# Patient Record
Sex: Male | Born: 1940 | Race: White | Hispanic: No | Marital: Married | Smoking: Never smoker
Health system: Southern US, Community
[De-identification: ages and names within clinical notes are randomized; demographics above are authoritative.]

## PROBLEM LIST (undated history)

## (undated) DIAGNOSIS — I480 Paroxysmal atrial fibrillation: Secondary | ICD-10-CM

## (undated) DIAGNOSIS — K222 Esophageal obstruction: Secondary | ICD-10-CM

## (undated) DIAGNOSIS — M1990 Unspecified osteoarthritis, unspecified site: Secondary | ICD-10-CM

## (undated) DIAGNOSIS — I10 Essential (primary) hypertension: Secondary | ICD-10-CM

## (undated) DIAGNOSIS — C61 Malignant neoplasm of prostate: Secondary | ICD-10-CM

## (undated) DIAGNOSIS — J302 Other seasonal allergic rhinitis: Secondary | ICD-10-CM

## (undated) DIAGNOSIS — N189 Chronic kidney disease, unspecified: Secondary | ICD-10-CM

## (undated) DIAGNOSIS — R55 Syncope and collapse: Secondary | ICD-10-CM

## (undated) DIAGNOSIS — G039 Meningitis, unspecified: Secondary | ICD-10-CM

## (undated) DIAGNOSIS — R351 Nocturia: Secondary | ICD-10-CM

## (undated) DIAGNOSIS — K219 Gastro-esophageal reflux disease without esophagitis: Secondary | ICD-10-CM

## (undated) DIAGNOSIS — R42 Dizziness and giddiness: Secondary | ICD-10-CM

## (undated) DIAGNOSIS — H269 Unspecified cataract: Secondary | ICD-10-CM

## (undated) DIAGNOSIS — T7840XA Allergy, unspecified, initial encounter: Secondary | ICD-10-CM

## (undated) DIAGNOSIS — G629 Polyneuropathy, unspecified: Secondary | ICD-10-CM

## (undated) DIAGNOSIS — Z87442 Personal history of urinary calculi: Secondary | ICD-10-CM

## (undated) DIAGNOSIS — E039 Hypothyroidism, unspecified: Secondary | ICD-10-CM

## (undated) HISTORY — DX: Essential (primary) hypertension: I10

## (undated) HISTORY — PX: JOINT REPLACEMENT: SHX530

## (undated) HISTORY — DX: Malignant neoplasm of prostate: C61

## (undated) HISTORY — DX: Chronic kidney disease, unspecified: N18.9

## (undated) HISTORY — DX: Polyneuropathy, unspecified: G62.9

## (undated) HISTORY — DX: Allergy, unspecified, initial encounter: T78.40XA

## (undated) HISTORY — DX: Unspecified cataract: H26.9

## (undated) HISTORY — PX: TONSILLECTOMY: SUR1361

## (undated) HISTORY — DX: Esophageal obstruction: K22.2

## (undated) HISTORY — DX: Paroxysmal atrial fibrillation: I48.0

## (undated) HISTORY — PX: APPENDECTOMY: SHX54

## (undated) HISTORY — PX: EYE SURGERY: SHX253

## (undated) HISTORY — DX: Dizziness and giddiness: R42

## (undated) HISTORY — DX: Other seasonal allergic rhinitis: J30.2

## (undated) HISTORY — PX: CHOLECYSTECTOMY: SHX55

## (undated) HISTORY — DX: Hypothyroidism, unspecified: E03.9

---

## 1999-08-10 HISTORY — PX: COLONOSCOPY: SHX174

## 2001-01-13 ENCOUNTER — Encounter: Payer: Self-pay | Admitting: Family Medicine

## 2001-01-13 ENCOUNTER — Ambulatory Visit (HOSPITAL_COMMUNITY): Admission: RE | Admit: 2001-01-13 | Discharge: 2001-01-13 | Payer: Self-pay | Admitting: Family Medicine

## 2001-06-02 ENCOUNTER — Ambulatory Visit (HOSPITAL_COMMUNITY): Admission: RE | Admit: 2001-06-02 | Discharge: 2001-06-02 | Payer: Self-pay | Admitting: Family Medicine

## 2001-06-02 ENCOUNTER — Encounter: Payer: Self-pay | Admitting: Family Medicine

## 2001-11-23 ENCOUNTER — Ambulatory Visit (HOSPITAL_COMMUNITY): Admission: RE | Admit: 2001-11-23 | Discharge: 2001-11-23 | Payer: Self-pay | Admitting: *Deleted

## 2008-03-18 ENCOUNTER — Ambulatory Visit (HOSPITAL_COMMUNITY): Admission: RE | Admit: 2008-03-18 | Discharge: 2008-03-18 | Payer: Self-pay | Admitting: Family Medicine

## 2008-03-31 ENCOUNTER — Ambulatory Visit: Payer: Self-pay | Admitting: Gastroenterology

## 2008-04-12 ENCOUNTER — Ambulatory Visit: Payer: Self-pay | Admitting: Internal Medicine

## 2008-04-12 ENCOUNTER — Encounter: Payer: Self-pay | Admitting: Internal Medicine

## 2008-04-12 ENCOUNTER — Ambulatory Visit (HOSPITAL_COMMUNITY): Admission: RE | Admit: 2008-04-12 | Discharge: 2008-04-12 | Payer: Self-pay | Admitting: Internal Medicine

## 2008-04-12 HISTORY — PX: ESOPHAGOGASTRODUODENOSCOPY: SHX1529

## 2008-08-05 DIAGNOSIS — C61 Malignant neoplasm of prostate: Secondary | ICD-10-CM

## 2008-08-05 HISTORY — DX: Malignant neoplasm of prostate: C61

## 2010-06-27 ENCOUNTER — Encounter: Payer: Self-pay | Admitting: Internal Medicine

## 2010-07-23 ENCOUNTER — Ambulatory Visit (HOSPITAL_COMMUNITY)
Admission: RE | Admit: 2010-07-23 | Discharge: 2010-07-23 | Payer: Self-pay | Source: Home / Self Care | Attending: Internal Medicine | Admitting: Internal Medicine

## 2010-07-23 HISTORY — PX: COLONOSCOPY: SHX174

## 2010-09-04 NOTE — Letter (Signed)
Summary: TRIAGE ORDER  TRIAGE ORDER   Imported By: Ave Filter 06/27/2010 16:10:40  _____________________________________________________________________  External Attachment:    Type:   Image     Comment:   External Document

## 2010-12-18 NOTE — Consult Note (Signed)
NAME:  MONTEY, EBEL               ACCOUNT NO.:  [id number]   MEDICAL RECORD NO.:  [id number]          PATIENT TYPE:  AMB   LOCATION:  DAY                           FACILITY:  [facility]   PHYSICIAN:  R. Roetta Sessions, M.D. DATE OF BIRTH:  Dec 17, 1940   DATE OF CONSULTATION:  DATE OF DISCHARGE:                                 CONSULTATION   REQUESTING PHYSICIAN:  Patrica Duel, MD   REASON FOR CONSULTATION:  Dysphagia.   HISTORY OF PRESENT ILLNESS:  Mr. Mcglasson is a 70 year old Caucasian  male.  He has had an intermittent history of solid food dysphagia.  He  has had about 5 episodes within the last year.  He tells me as though  food gets stuck and points to his mid-esophagus.  He has had 1 episode  with a big gulp of Gatorade, but otherwise denies any problems with  liquids.  He denies any history of regurgitation.  Denies any nausea,  vomiting, anorexia, or early satiety.  His weight has remained stable.  He has been on omeprazole 20 mg p.r.n., but generally only takes this  when he eats [place] or [place] food about once a week for heartburn and  indigestion.  He denies rectal bleeding or melena.Marland Kitchen   PAST MEDICAL AND SURGICAL HISTORY:  Hypertension, seasonal allergies.  Colonoscopy by Dr. Jena Gauss on August 10, 1999, showed granular rectal  mucosa/chronic inflammatory infiltrate, and he had a inflammatory polyp  removed from the hepatic flexure and right colon diverticula, internal  hemorrhoids, and normal TI.  He has had some prostate problems.  He is  status post cholecystectomy about 15 years ago for large gallstone.  He  is status post appendectomy, tonsillectomy, and he had a benign tumor  removed from his right arm.   CURRENT MEDICATIONS:  1. Toprol 50 mg daily.  2. Norvasc 2.5 mg daily.  3. Prozac 10 mg daily.  4. Flomax 0.4 mg daily.  5. Multivitamin daily.  6. Fish oil 2 g daily.  7. Nabumetone 500 mg p.r.n.  8. Omeprazole 20 mg p.r.n.  9. Potassium 99 mg daily p.r.n.  10.Excedrin 1-2 about 2-3 days p.r.n.   ALLERGIES:  SULFA and PENICILLIN.   FAMILY HISTORY:  There is no known family history of colorectal  carcinoma, liver, or chronic GI problems.  One brother at age 40 has  history of prostate carcinoma.  Father deceased at age 74 secondary to  prostate carcinoma.  Mother deceased at 83 secondary to coronary artery  disease.   SOCIAL HISTORY:  Mr. Shawler is married.  He has 2 healthy children.  He  is retired from [employer] treasurer.  He denies any tobacco or drug use.  He consumes 3 beers per week.   REVIEW OF SYSTEMS:  See HPI.  He has been having frequent headaches  about 3 times a week.  He attributes this to his seasonal allergies.  He  is being worked up by Dr. Nobie Putnam for this and some of his medications  are being adjusted.  Otherwise, negative review of systems.   PHYSICAL EXAMINATION:  VITAL SIGNS:  Weight 214 pounds, height 71  inches, temperature 97.9, blood pressure 118/72, and pulse 64.  GENERAL:  He is a well-developed, well-nourished Caucasian male in no  acute distress.  HEENT:  Sclerae clear, nonicteric. Conjunctivae pink. Oropharynx pink  and moist without any lesions.  NECK: Supple without mass or thyromegaly.  CHEST:  Heart regular rate and rhythm.  Normal S1, S2 without murmurs,  clicks, rubs or gallops.  LUNGS:  Clear to auscultation bilaterally.  ABDOMEN:  Positive bowel sounds x4.  No bruits auscultated.  Soft,  nontender, and nondistended without palpable mass or hepatosplenomegaly.  No rebound tenderness or guarding.  EXTREMITIES:  Without clubbing or edema bilaterally.  SKIN:  Pink, warm, and dry without any rash or jaundice.   IMPRESSION:  Mr. Schwertner is a 70 year old Caucasian male with  intermittent mostly solid food dysphagia over the last year.  He has  never had a evaluation with the esophagogastroduodenoscopy to rule out  esophageal web, ring, or stricture.  He has infrequent gastroesophageal  reflux  disease with episodes about once per week and is on on-demand  proton pump inhibitor.  I suspect he may have complicated  gastroesophageal reflux disease as a culprit of his symptoms, but we do  need to rule out an occult malignancy and esophageal motility disorder  will remain in the differential as well.   PLAN:  1. EGD with possible esophageal dilatation with Dr. Jena Gauss in the near      future.  I have discussed the procedure including risk and      benefits, which include, but not limited to bleeding, infection,      perforation, and drug reaction and he agrees to plan and consent to      be obtained.  He is to hold any aspirin products for about 5 days      prior to his procedure.  He can continue omeprazole 20 mg p.r.n.,      but he may need daily PPI.  We will see what EGD shows.  2. His screening colonoscopy on August 24, 2009.  We will put a      remainder in the computer.   Thank you Dr. Nobie Putnam for allowing [place] to participate in the care of Mr.  Malveaux.      Lorenza Burton, N.P.      Jonathon Bellows, M.D.  Electronically Signed    KJ/MEDQ  D:  03/31/2008  T:  04/01/2008  Job:  [id number]

## 2010-12-18 NOTE — Op Note (Signed)
NAME:  Brent Wilkins, HAUG               ACCOUNT NO.:  [id number]   MEDICAL RECORD NO.:  [id number]          PATIENT TYPE:  AMB   LOCATION:  DAY                           FACILITY:  [facility]   PHYSICIAN:  R. Roetta Sessions, M.D. DATE OF BIRTH:  12/18/1940   DATE OF PROCEDURE:  DATE OF DISCHARGE:                               OPERATIVE REPORT   INDICATIONS FOR PROCEDURE:  A 70 year old gentleman with esophageal  dysphagia to solids.  He does take aspirin in the way of Excedrin on a  regular basis and takes omeprazole 20 mg as needed for indigestion.  EGD now being done potential for esophageal dilation.  Reviewed risks,  benefits, and alternatives have been discussed.  Please see  documentation in the medical record.   PROCEDURE NOTE:  O2 saturation, blood pressure, pulse, and respirations  monitored throughout the entire procedure.   CONSCIOUS SEDATION:  Versed 5 mg IV and Demerol 100 mg IV in divided  doses.  Cetacaine spray for topical pharyngeal anesthesia.   INSTRUMENT:  Pentax video chip system.   FINDINGS:  Examination of the tubular esophagus revealed a prominent  Schatzki ring with fibrotic component overlying esophageal erosions.  There was no Barrett esophagus or evidence of neoplasia.  The scope  passed EG junction with slight amount of resistance at the hernial  ring.   STOMACH:  Gastric cavity was emptied, insufflated well with air.  A  thorough examination of the gastric mucosa including retroflexion of the  proximal stomach esophagogastric junction demonstrated a small hiatal  hernia and the previously noted ring was seen well retroflexed.  Also,  the patient had multiple prepyloric erosions and multiple 1-3 mm ulcers,  these appeared to be benign lesions.  The remainder of the gastric  mucosa appeared unremarkable.  Pylorus patent, easily traversed.  Examination of the bulb and second portion revealed no abnormalities.   THERAPEUTIC/DIAGNOSTIC MANEUVERS PERFORMED:  Scope  was withdrawn.  A 56-  French Maloney dilator was passed in full insertion with slight  resistance.  Upon full insertion.  A look back revealed the ring had  been nicely dilated without apparent complication.  Subsequently, the  antral ulcers were biopsied for histologic study, and the patient  tolerated the procedure well and was reactive to endoscopy.   IMPRESSION:  1. 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  2. Multiple antral erosions, ulcerations, status post biopsy, small      hiatal hernia otherwise unremarkable stomach.   RECOMMENDATIONS:  1. 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  He needs to take      omeprazole 20 mg every day, stress the nature of reflux and the      setting is complicated and is      a chronic disease, needing daily suppression therapy.  2. Followup on path/rule out H. Pylori.  3. Further recommendations to follow.  Jonathon Bellows, M.D.  Electronically Signed     RMR/MEDQ  D:  04/12/2008  T:  04/12/2008  Job:  [id number]   cc:   Patrica Duel, M.D.  Fax: [phone]

## 2010-12-21 NOTE — Procedures (Signed)
[place]. [facility]  Patient:    JANCARLOS, THRUN Visit Number: [id number] MRN: [id number]          Service Type: CAT Location: [facility] [address] 01 Attending Physician:  Darlin Priestly Dictated by:   Darlin Priestly, M.D. Proc. Date: 11/23/01 Admit Date:  11/23/2001 Discharge Date: 11/23/2001   CC:         A. Kem Boroughs, M.D.  Jonell Cluck, M.D.   Procedure Report  PROCEDURES: Heads up tilt-table testing.  COMPLICATIONS: None.  INDICATIONS: The patient is a 70 year old white male, patient of Dr. Kem Boroughs and Dr. Patrica Duel, with a history of recurrent syncope, hypertension, hyperlipidemia. He is now referred for tilt-table testing to rule out vasodepressive syncope.  DESCRIPTION OF OPERATION: After given informed written consent, the patient was brought to the EP lab in a fasting state. The patient was then placed in a supine position and hemodynamic measurements were obtained. Resting blood pressure 140/85, resting heart 69.  The patient was then tilted to 70-degree heads up position. He remained hemodynamically stable until approximately 28 minutes into the heads up tilt when the patient became hypotensive with blood pressure dropping to 70/58 and pulse of 43. The patient became light headed and had syncopal event. The patient was then turned to a supine position where his heart rate and blood pressure returned to 130/77 and a pulse of 65. He was then transferred to the recovery room in stable condition.  CONCLUSIONS: Positive heads up tilt-table testing. Dictated by:   Darlin Priestly, M.D. Attending Physician:  Darlin Priestly DD:  11/23/01 TD:  11/23/01 Job: [id number]

## 2011-04-25 ENCOUNTER — Other Ambulatory Visit (HOSPITAL_COMMUNITY): Payer: Self-pay | Admitting: Internal Medicine

## 2011-04-25 DIAGNOSIS — R51 Headache: Secondary | ICD-10-CM

## 2011-04-25 DIAGNOSIS — R42 Dizziness and giddiness: Secondary | ICD-10-CM

## 2011-04-29 ENCOUNTER — Ambulatory Visit (HOSPITAL_COMMUNITY)
Admission: RE | Admit: 2011-04-29 | Discharge: 2011-04-29 | Disposition: A | Discharge status: Home / Self Care | Payer: Medicare Other | Source: Ambulatory Visit | Attending: Internal Medicine | Admitting: Internal Medicine

## 2011-04-29 DIAGNOSIS — R51 Headache: Secondary | ICD-10-CM

## 2011-04-29 DIAGNOSIS — I10 Essential (primary) hypertension: Secondary | ICD-10-CM | POA: Insufficient documentation

## 2011-04-29 DIAGNOSIS — R42 Dizziness and giddiness: Secondary | ICD-10-CM

## 2011-04-29 DIAGNOSIS — C61 Malignant neoplasm of prostate: Secondary | ICD-10-CM | POA: Insufficient documentation

## 2011-10-21 ENCOUNTER — Encounter: Payer: Self-pay | Admitting: Internal Medicine

## 2011-10-22 ENCOUNTER — Encounter: Payer: Self-pay | Admitting: Urgent Care

## 2011-10-22 ENCOUNTER — Ambulatory Visit (INDEPENDENT_AMBULATORY_CARE_PROVIDER_SITE_OTHER): Payer: Medicare Other | Admitting: Urgent Care

## 2011-10-22 DIAGNOSIS — R1310 Dysphagia, unspecified: Secondary | ICD-10-CM | POA: Insufficient documentation

## 2011-10-22 DIAGNOSIS — K222 Esophageal obstruction: Secondary | ICD-10-CM | POA: Insufficient documentation

## 2011-10-22 DIAGNOSIS — Q391 Atresia of esophagus with tracheo-esophageal fistula: Secondary | ICD-10-CM

## 2011-10-22 DIAGNOSIS — K219 Gastro-esophageal reflux disease without esophagitis: Secondary | ICD-10-CM

## 2011-10-22 HISTORY — DX: Esophageal obstruction: K22.2

## 2011-10-22 MED ORDER — OMEPRAZOLE 20 MG PO CPDR: 20.0000 mg | DELAYED_RELEASE_CAPSULE | Freq: Every day | ORAL | Status: DC

## 2011-10-22 NOTE — Assessment & Plan Note (Signed)
Brent Wilkins is a pleasant 71 y.o. male history of erosive reflux esophagitis, chronic gastritis, Schatzki's ring presents with recurrent dysphagia.  He is on Relafen twice daily. He previously had gastritis, most likely NSAID-induced.  Unfortunately, he has not been compliant with daily PPI.  EGD with Dr. Jena Gauss with possible esophageal dilation.  I have discussed risks & benefits which include, but are not limited to, bleeding, infection, perforation & drug reaction.  The patient agrees with this plan & written consent will be obtained.   Resume omeprazole 20 mg daily

## 2011-10-22 NOTE — Patient Instructions (Signed)
Begin omeprazole (Prilosec) 20 mg daily You will need an EGD and dilation of your esophagus with Dr. Jena Gauss in the near future. We will call to schedule. Please call us back if you do not hear anything by the end of this week. Dysphagia Swallowing problems (dysphagia) occur when solids and liquids seem to stick in your throat on the way down to your stomach, or the food takes longer to get to the stomach. Other symptoms (problems) include regurgitating (burping) up food, noises coming from the throat, chest discomfort with swallowing, and a feeling of fullness in the throat when swallowing. When blockage in the throat is complete it may be associated with drooling. CAUSES There are many causes of swallowing difficulties and the following is generalized information regarding a number of reasons for this problem. Problems with swallowing may occur because of problems with the muscles. The food cannot be propelled in the usual manner into the stomach. There may be ulcers, scar tissueor inflammation (soreness) in the esophagus (the food tube from the mouth to the stomach) which blocks food from passing normally into the stomach. Causes of inflammation include acid reflux from the stomach into the esophagus. Inflammation can also be caused by the herpes simplex virus, Candida (yeast), radiation (as with treatment of cancer), or inflammation from medications not taken with adequate fluids to wash them down into the stomach. There may be nerve problems so signals cannot be sent adequately telling the muscles of the esophagus to contract and move the food along. Achalasia is a rare disorder of the esophagus in which muscular contractions of the esophagus are uncoordinated. Globus hystericus is a relatively common problem in young females in which there is a sense of an obstruction or difficulty in swallowing, but in which no abnormalities can be found. This problem usually improves over time with reassurance and  testing to rule out other causes. DIAGNOSIS A number of tests will help your caregiver know what is the cause of your swallowing problems. These tests may include a barium swallow in which x-rays are taken while you are drinking a liquid that outlines the lining of the esophagus on x-ray. If the stomach and small bowel are also studied in this manner it is called an upper gastrointestinal exam (UGI). Endoscopy may be done in which your caregiver examines your throat, esophagus, stomach and small bowel with an instrument like a small flexible telescope. Motility studies which measure the effectiveness and coordination of the muscular contractions of the esophagus may also be done. TREATMENT The treatment of swallowing problems are many, varying from medications to surgical treatment. The treatment varies with the type of problem found. Your caregiver will discuss your results and treatment with you. If swallowing problems are severe the long term problems which may occur include: malnutrition, pneumonia (from food going into the breathing tubes called trachea and bronchi), and an increase in tumors (lumps) of the esophagus. SEEK IMMEDIATE MEDICAL CARE IF:  Food or other object becomes lodged in your throat or esophagus and won't move.  Document Released: 07/19/2000 Document Revised: 07/11/2011 Document Reviewed: 03/09/2008 [facility] Patient Information 2012 [place], [place].

## 2011-10-22 NOTE — Progress Notes (Signed)
Faxed to PCP

## 2011-10-22 NOTE — Progress Notes (Signed)
Primary Care Physician:  Cassell Smiles., MD, MD Primary Gastroenterologist: Dr. Jena Gauss  Chief Complaint  Patient presents with  . Dysphagia    HPI:  Brent Wilkins is a 71 y.o. male with recurrent dysphagia. He has history of Schatzki's ring and erosive/ulcerative esophagitis. Last dilation was in 2009. He reports having problems over the last 6 months with dry meats and cold liquids. He tells me his last dilation held for approximately 3 years. He has been very careful chewing his food thoroughly. He feels as though his food and liquids get stuck midsternally. He occasionally has regurgitation of undigested food. He denies any heartburn or indigestion unless he eats certain foods like [place]. He has been taking TUMS which seems to help. He stopped omeprazole 20 mg daily years ago as he felt he did not need this medication. His weight has remained stable. His appetite is good. Denies any lower GI symptoms including constipation, diarrhea, rectal bleeding, or melena. He is taking Relafen twice daily.  Past Medical History  Diagnosis Date  . HTN (hypertension)   . Seasonal allergies   . Prostate cancer     Dr. Earlene Plater  . Neuropathy     Bilateral ankles  . Hypothyroidism     Past Surgical History  Procedure Date  . Tonsillectomy   . Appendectomy   . Colonoscopy 07/23/10    Dr. Elmer Ramp rectum, scattered pancolonic diverticula  . Colonoscopy 08/10/1999    internal hemorrhoids,inflammatory polyp  . Cholecystectomy   . Esophagogastroduodenoscopy 04/12/08    Prominent Schatzki's ring, erosive reflux esophagitis, multiple antral erosions, small hiatal hernia, reactive gastropathy, status post dilation with 61F    Current Outpatient Prescriptions  Medication Sig Dispense Refill  . amLODipine (NORVASC) 2.5 MG tablet Take 5 mg by mouth daily.       . AVODART 0.5 MG capsule Take 0.5 mg by mouth daily.       . CHONDROITIN SULFATE PO Take 1,200 mg by mouth 2 (two) times daily.      . fish  oil-omega-3 fatty acids 1000 MG capsule Take 2 g by mouth daily.      . Multiple Vitamin (MULTIVITAMIN) capsule Take 1 capsule by mouth daily.      . nabumetone (RELAFEN) 500 MG tablet Take 500 mg by mouth 2 (two) times daily.       . Potassium 99 MG TABS Take 99 mg by mouth daily.      . propranolol (INDERAL) 40 MG tablet Take 40 mg by mouth 2 (two) times daily.       Marland Kitchen SYNTHROID 25 MCG tablet Take 25 mcg by mouth daily.       . Tamsulosin HCl (FLOMAX) 0.4 MG CAPS 0.4 mg daily after supper.       . vitamin B-12 (CYANOCOBALAMIN) 1000 MCG tablet Take 1,000 mcg by mouth daily.      Marland Kitchen omeprazole (PRILOSEC) 20 MG capsule Take 1 capsule (20 mg total) by mouth daily.  90 capsule  3    Allergies as of 10/22/2011 - Review Complete 10/22/2011  Allergen Reaction Noted  . Penicillins Swelling and Rash 10/22/2011  . Sulfa antibiotics Swelling and Rash 10/22/2011    Family History:There is no known family history of colorectal carcinoma , liver disease, or inflammatory bowel disease.  Problem Relation Age of Onset  . Prostate cancer Father   . Prostate cancer Son 6  . Prostate cancer Brother     History   Social History  . Marital Status: Married  Spouse Name: N/A    Number of Children: 2  . Years of Education: N/A   Occupational History  . retired; Corporate treasurer    Social History Main Topics  . Smoking status: Never Smoker   . Smokeless tobacco: Not on file  . Alcohol Use: Yes     2 beers a week  . Drug Use: No  . Sexually Active: Not on file  Review of Systems: Gen: Denies any fever, chills, sweats, anorexia, fatigue, weakness, malaise, weight loss, and sleep disorder CV: Denies chest pain, angina, palpitations, syncope, orthopnea, PND, peripheral edema, and claudication. Resp: Denies dyspnea at rest, dyspnea with exercise, cough, sputum, wheezing, coughing up blood, and pleurisy. GI: Denies vomiting blood, jaundice, and fecal incontinence. GU : Denies urinary burning,  blood in urine, urinary frequency, urinary hesitancy, nocturnal urination, and urinary incontinence. MS:  Denies muscle weakness, cramps, atrophy.  Derm: Denies rash, itching, dry skin, hives, moles, warts, or unhealing ulcers.  Psych: Denies depression, anxiety, memory loss, suicidal ideation, hallucinations, paranoia, and confusion. Heme: Denies bruising, bleeding, and enlarged lymph nodes. Neuro:  Denies any headaches, dizziness, paresthesias. Endo:  Denies any problems with DM, thyroid, adrenal function.  Physical Exam: BP 142/82  Pulse 63  Temp(Src) 97.6 F (36.4 C) (Temporal)  Ht 5\' 11"  (1.803 m)  Wt 218 lb 3.2 oz (98.975 kg)  BMI 30.43 kg/m2 General:   Alert,  Well-developed, well-nourished, pleasant and cooperative in NAD Head:  Normocephalic and atraumatic. Eyes:  Sclera clear, no icterus.   Conjunctiva pink. Ears:  Normal auditory acuity. Nose:  No deformity, discharge, or lesions. Mouth:  No deformity or lesions,oropharynx pink & moist. Neck:  Supple; no masses or thyromegaly. Lungs:  Clear throughout to auscultation.   No wheezes, crackles, or rhonchi. No acute distress. Heart:  Regular rate and rhythm; no murmurs, clicks, rubs,  or gallops. Abdomen:  Normal bowel sounds.  No bruits.  Soft, non-tender and non-distended without masses, hepatosplenomegaly or hernias noted.  No guarding or rebound tenderness.   Rectal:  Deferred. Msk:  Symmetrical without gross deformities. Normal posture. Pulses:  Normal pulses noted. Extremities:  No clubbing or edema. Neurologic:  Alert and  oriented x4;  grossly normal neurologically. Skin:  Intact without significant lesions or rashes. Lymph Nodes:  No significant cervical adenopathy. Psych:  Alert and cooperative. Normal mood and affect.

## 2011-10-22 NOTE — Assessment & Plan Note (Signed)
see dysphagia 

## 2011-10-22 NOTE — Assessment & Plan Note (Signed)
Occasional heartburn. Taking when necessary TUMS. Advised to resume PPI with history of complicated GERD, Schatzki's ring and gastritis, as well as protection from chronic NSAIDs.

## 2011-10-23 ENCOUNTER — Other Ambulatory Visit: Payer: Self-pay | Admitting: Gastroenterology

## 2011-10-23 ENCOUNTER — Encounter: Payer: Self-pay | Admitting: Gastroenterology

## 2011-10-24 ENCOUNTER — Encounter (HOSPITAL_COMMUNITY): Payer: Self-pay | Admitting: Pharmacy Technician

## 2011-10-25 ENCOUNTER — Telehealth: Payer: Self-pay | Admitting: Gastroenterology

## 2011-10-25 MED ORDER — SODIUM CHLORIDE 0.45 % IV SOLN
Freq: Once | INTRAVENOUS | Status: AC
  Administered 2011-10-28: 11:00:00 via INTRAVENOUS

## 2011-10-25 NOTE — Telephone Encounter (Signed)
Pt aware of new arrival & procedure time- He will check in at [facility] @ 12:00

## 2011-10-28 ENCOUNTER — Encounter (HOSPITAL_COMMUNITY): Payer: Self-pay | Admitting: *Deleted

## 2011-10-28 ENCOUNTER — Encounter (HOSPITAL_COMMUNITY)
Admission: RE | Disposition: A | Discharge status: Home / Self Care | Payer: Self-pay | Source: Ambulatory Visit | Attending: Internal Medicine

## 2011-10-28 ENCOUNTER — Ambulatory Visit (HOSPITAL_COMMUNITY)
Admission: RE | Admit: 2011-10-28 | Discharge: 2011-10-28 | Disposition: A | Discharge status: Home / Self Care | Payer: Medicare Other | Source: Ambulatory Visit | Attending: Internal Medicine | Admitting: Internal Medicine

## 2011-10-28 DIAGNOSIS — K222 Esophageal obstruction: Secondary | ICD-10-CM | POA: Insufficient documentation

## 2011-10-28 DIAGNOSIS — R1310 Dysphagia, unspecified: Secondary | ICD-10-CM | POA: Insufficient documentation

## 2011-10-28 DIAGNOSIS — K2960 Other gastritis without bleeding: Secondary | ICD-10-CM

## 2011-10-28 DIAGNOSIS — K2940 Chronic atrophic gastritis without bleeding: Secondary | ICD-10-CM | POA: Insufficient documentation

## 2011-10-28 DIAGNOSIS — Z79899 Other long term (current) drug therapy: Secondary | ICD-10-CM | POA: Insufficient documentation

## 2011-10-28 DIAGNOSIS — K449 Diaphragmatic hernia without obstruction or gangrene: Secondary | ICD-10-CM

## 2011-10-28 DIAGNOSIS — I10 Essential (primary) hypertension: Secondary | ICD-10-CM | POA: Insufficient documentation

## 2011-10-28 HISTORY — PX: SAVORY DILATION: SHX5439

## 2011-10-28 HISTORY — PX: MALONEY DILATION: SHX5535

## 2011-10-28 HISTORY — PX: ESOPHAGOGASTRODUODENOSCOPY: SHX5428

## 2011-10-28 SURGERY — EGD (ESOPHAGOGASTRODUODENOSCOPY)
Anesthesia: Moderate Sedation

## 2011-10-28 MED ORDER — MIDAZOLAM HCL 5 MG/5ML IJ SOLN
INTRAMUSCULAR | Status: AC
  Filled 2011-10-28: qty 10

## 2011-10-28 MED ORDER — MEPERIDINE HCL 100 MG/ML IJ SOLN
INTRAMUSCULAR | Status: AC
  Filled 2011-10-28: qty 1

## 2011-10-28 MED ORDER — MEPERIDINE HCL 100 MG/ML IJ SOLN
INTRAMUSCULAR | Status: DC | PRN
  Administered 2011-10-28: 25 mg via INTRAVENOUS
  Administered 2011-10-28: 50 mg via INTRAVENOUS

## 2011-10-28 MED ORDER — STERILE WATER FOR IRRIGATION IR SOLN
Status: DC | PRN
  Administered 2011-10-28: 12:00:00

## 2011-10-28 MED ORDER — MIDAZOLAM HCL 5 MG/5ML IJ SOLN
INTRAMUSCULAR | Status: DC | PRN
  Administered 2011-10-28 (×2): 2 mg via INTRAVENOUS

## 2011-10-28 MED ORDER — BUTAMBEN-TETRACAINE-BENZOCAINE 2-2-14 % EX AERO
INHALATION_SPRAY | CUTANEOUS | Status: DC | PRN
  Administered 2011-10-28: 2 via TOPICAL

## 2011-10-28 NOTE — Interval H&P Note (Signed)
History and Physical Interval Note:  10/28/2011 11:40 AM  Brent Wilkins  has presented today for surgery, with the diagnosis of dysphagia  The various methods of treatment have been discussed with the patient and family. After consideration of risks, benefits and other options for treatment, the patient has consented to  Procedure(s) (LRB): ESOPHAGOGASTRODUODENOSCOPY (EGD) (N/A) SAVORY DILATION (N/A) MALONEY DILATION (N/A) as a surgical intervention .  The patients' history has been reviewed, patient examined, no change in status, stable for surgery.  I have reviewed the patients' chart and labs.  Questions were answered to the patient's satisfaction.     Eula Listen

## 2011-10-28 NOTE — Op Note (Signed)
[facility] [address] [place], [place]  [address]  ENDOSCOPY PROCEDURE REPORT  PATIENT:  Brent Wilkins, Brent Wilkins  MR#:  [id number] BIRTHDATE:  1941/08/03, 70 yrs. old  GENDER:  male  ENDOSCOPIST:  R. Roetta Sessions, MD Caleen Essex Referred by:  Artis Delay, M.D.  PROCEDURE DATE:  10/28/2011 PROCEDURE:  EGD with Elease Hashimoto dilation followed by gastric biopsy  INDICATIONS:   Recurrent esophageal dysphagia and a history  of a known Schatzki's ring .  INFORMED CONSENT:   The risks, benefits, limitations, alternatives and imponderables have been discussed.  The potential for biopsy, esophogeal dilation, etc. have also been reviewed.  Questions have been answered.  All parties agreeable.  Please see the history and physical in the medical record for more information.  MEDICATIONS:  Versed 4 mg IV and Versed 75 mg IV in divided doses. Cetacaine spray.  DESCRIPTION OF PROCEDURE:   The EG-2990i ([id number]) and EG-2990i ([id number]) endoscope was introduced through the mouth and advanced to the second portion of the duodenum without difficulty or limitations.  The mucosal surfaces were surveyed very carefully during advancement of the scope and upon withdrawal.  Retroflexion view of the proximal stomach and esophagogastric junction was performed.  <<PROCEDUREIMAGES>>  FINDINGS:  Prominent Schatzki's ring;  otherwise, normal esophagus. . Stomach empty. Moderate-sized hernia. Multiple antral erosions. No ulcer or infiltrating process. Pylorus patent. Normal first and second portion of the duodenum.  THERAPEUTIC / DIAGNOSTIC MANEUVERS PERFORMED:  A 56 French Maloney dilator was passed to full insertion with moderate resistance. A look back revealed the ring only partially ruptured. Utilizing the jumbo biopsy forceps, 2 quadrant "bites" of the ring were taken to additionally disrupt it. These maneuvers were done without difficulty or apparent complication. Subsequently ,  biopsies of the  gastric antrum were taken for histologic study.  COMPLICATIONS:   None  IMPRESSION:  Schatzki's ring-status post dilation described above. Hiatal hernia. Antral erosions-status post biopsy  RECOMMENDATIONS:  Followup on pathology. Resume Prilosec 20 mg  ______________________________ R. Roetta Sessions, MD Caleen Essex  CC:  n. eSIGNED:   R. Roetta Sessions at 10/28/2011 12:16 PM  Claudina Lick, [phone]

## 2011-10-28 NOTE — H&P (View-Only) (Signed)
Primary Care Physician:  FUSCO,LAWRENCE J., MD, MD Primary Gastroenterologist: Dr. Rourk  Chief Complaint  Patient presents with  . Dysphagia    HPI:  Brent Wilkins is a 70 y.o. male with recurrent dysphagia. He has history of Schatzki's ring and erosive/ulcerative esophagitis. Last dilation was in 2009. He reports having problems over the last 6 months with dry meats and cold liquids. He tells me his last dilation held for approximately 3 years. He has been very careful chewing his food thoroughly. He feels as though his food and liquids get stuck midsternally. He occasionally has regurgitation of undigested food. He denies any heartburn or indigestion unless he eats certain foods like Mexican. He has been taking TUMS which seems to help. He stopped omeprazole 20 mg daily years ago as he felt he did not need this medication. His weight has remained stable. His appetite is good. Denies any lower GI symptoms including constipation, diarrhea, rectal bleeding, or melena. He is taking Relafen twice daily.  Past Medical History  Diagnosis Date  . HTN (hypertension)   . Seasonal allergies   . Prostate cancer     Dr. Davis  . Neuropathy     Bilateral ankles  . Hypothyroidism     Past Surgical History  Procedure Date  . Tonsillectomy   . Appendectomy   . Colonoscopy 07/23/10    Dr. Rourk->normal rectum, scattered pancolonic diverticula  . Colonoscopy 08/10/1999    internal hemorrhoids,inflammatory polyp  . Cholecystectomy   . Esophagogastroduodenoscopy 04/12/08    Prominent Schatzki's ring, erosive reflux esophagitis, multiple antral erosions, small hiatal hernia, reactive gastropathy, status post dilation with 56F    Current Outpatient Prescriptions  Medication Sig Dispense Refill  . amLODipine (NORVASC) 2.5 MG tablet Take 5 mg by mouth daily.       . AVODART 0.5 MG capsule Take 0.5 mg by mouth daily.       . CHONDROITIN SULFATE PO Take 1,200 mg by mouth 2 (two) times daily.      . fish  oil-omega-3 fatty acids 1000 MG capsule Take 2 g by mouth daily.      . Multiple Vitamin (MULTIVITAMIN) capsule Take 1 capsule by mouth daily.      . nabumetone (RELAFEN) 500 MG tablet Take 500 mg by mouth 2 (two) times daily.       . Potassium 99 MG TABS Take 99 mg by mouth daily.      . propranolol (INDERAL) 40 MG tablet Take 40 mg by mouth 2 (two) times daily.       . SYNTHROID 25 MCG tablet Take 25 mcg by mouth daily.       . Tamsulosin HCl (FLOMAX) 0.4 MG CAPS 0.4 mg daily after supper.       . vitamin B-12 (CYANOCOBALAMIN) 1000 MCG tablet Take 1,000 mcg by mouth daily.      . omeprazole (PRILOSEC) 20 MG capsule Take 1 capsule (20 mg total) by mouth daily.  90 capsule  3    Allergies as of 10/22/2011 - Review Complete 10/22/2011  Allergen Reaction Noted  . Penicillins Swelling and Rash 10/22/2011  . Sulfa antibiotics Swelling and Rash 10/22/2011    Family History:There is no known family history of colorectal carcinoma , liver disease, or inflammatory bowel disease.  Problem Relation Age of Onset  . Prostate cancer Father   . Prostate cancer Son 45  . Prostate cancer Brother     History   Social History  . Marital Status: Married      Spouse Name: N/A    Number of Children: 2  . Years of Education: N/A   Occupational History  . retired; sales and marketing    Social History Main Topics  . Smoking status: Never Smoker   . Smokeless tobacco: Not on file  . Alcohol Use: Yes     2 beers a week  . Drug Use: No  . Sexually Active: Not on file  Review of Systems: Gen: Denies any fever, chills, sweats, anorexia, fatigue, weakness, malaise, weight loss, and sleep disorder CV: Denies chest pain, angina, palpitations, syncope, orthopnea, PND, peripheral edema, and claudication. Resp: Denies dyspnea at rest, dyspnea with exercise, cough, sputum, wheezing, coughing up blood, and pleurisy. GI: Denies vomiting blood, jaundice, and fecal incontinence. GU : Denies urinary burning,  blood in urine, urinary frequency, urinary hesitancy, nocturnal urination, and urinary incontinence. MS:  Denies muscle weakness, cramps, atrophy.  Derm: Denies rash, itching, dry skin, hives, moles, warts, or unhealing ulcers.  Psych: Denies depression, anxiety, memory loss, suicidal ideation, hallucinations, paranoia, and confusion. Heme: Denies bruising, bleeding, and enlarged lymph nodes. Neuro:  Denies any headaches, dizziness, paresthesias. Endo:  Denies any problems with DM, thyroid, adrenal function.  Physical Exam: BP 142/82  Pulse 63  Temp(Src) 97.6 F (36.4 C) (Temporal)  Ht 5' 11" (1.803 m)  Wt 218 lb 3.2 oz (98.975 kg)  BMI 30.43 kg/m2 General:   Alert,  Well-developed, well-nourished, pleasant and cooperative in NAD Head:  Normocephalic and atraumatic. Eyes:  Sclera clear, no icterus.   Conjunctiva pink. Ears:  Normal auditory acuity. Nose:  No deformity, discharge, or lesions. Mouth:  No deformity or lesions,oropharynx pink & moist. Neck:  Supple; no masses or thyromegaly. Lungs:  Clear throughout to auscultation.   No wheezes, crackles, or rhonchi. No acute distress. Heart:  Regular rate and rhythm; no murmurs, clicks, rubs,  or gallops. Abdomen:  Normal bowel sounds.  No bruits.  Soft, non-tender and non-distended without masses, hepatosplenomegaly or hernias noted.  No guarding or rebound tenderness.   Rectal:  Deferred. Msk:  Symmetrical without gross deformities. Normal posture. Pulses:  Normal pulses noted. Extremities:  No clubbing or edema. Neurologic:  Alert and  oriented x4;  grossly normal neurologically. Skin:  Intact without significant lesions or rashes. Lymph Nodes:  No significant cervical adenopathy. Psych:  Alert and cooperative. Normal mood and affect.   

## 2011-10-28 NOTE — Discharge Instructions (Addendum)
EGD Discharge instructions Please read the instructions outlined below and refer to this sheet in the next few weeks. These discharge instructions provide you with general information on caring for yourself after you leave the hospital. Your doctor may also give you specific instructions. While your treatment has been planned according to the most current medical practices available, unavoidable complications occasionally occur. If you have any problems or questions after discharge, please call your doctor. ACTIVITY  You may resume your regular activity but move at a slower pace for the next 24 hours.   Take frequent rest periods for the next 24 hours.   Walking will help expel (get rid of) the air and reduce the bloated feeling in your abdomen.   No driving for 24 hours (because of the anesthesia (medicine) used during the test).   You may shower.   Do not sign any important legal documents or operate any machinery for 24 hours (because of the anesthesia used during the test).  NUTRITION  Drink plenty of fluids.   You may resume your normal diet.   Begin with a light meal and progress to your normal diet.   Avoid alcoholic beverages for 24 hours or as instructed by your caregiver.  MEDICATIONS  You may resume your normal medications unless your caregiver tells you otherwise.  WHAT YOU CAN EXPECT TODAY  You may experience abdominal discomfort such as a feeling of fullness or "gas" pains.  FOLLOW-UP  Your doctor will discuss the results of your test with you.  SEEK IMMEDIATE MEDICAL ATTENTION IF ANY OF THE FOLLOWING OCCUR:  Excessive nausea (feeling sick to your stomach) and/or vomiting.   Severe abdominal pain and distention (swelling).   Trouble swallowing.   Temperature over 101 F (37.8 C).   Rectal bleeding or vomiting of blood.    GERD information provided.  Resume Prilosec 20 mg daily Gastroesophageal Reflux Disease, Adult Gastroesophageal reflux disease  (GERD) happens when acid from your stomach flows up into the esophagus. When acid comes in contact with the esophagus, the acid causes soreness (inflammation) in the esophagus. Over time, GERD may create small holes (ulcers) in the lining of the esophagus. CAUSES   Increased body weight. This puts pressure on the stomach, making acid rise from the stomach into the esophagus.   Smoking. This increases acid production in the stomach.   Drinking alcohol. This causes decreased pressure in the lower esophageal sphincter (valve or ring of muscle between the esophagus and stomach), allowing acid from the stomach into the esophagus.   Late evening meals and a full stomach. This increases pressure and acid production in the stomach.   A malformed lower esophageal sphincter.  Sometimes, no cause is found. SYMPTOMS   Burning pain in the lower part of the mid-chest behind the breastbone and in the mid-stomach area. This may occur twice a week or more often.   Trouble swallowing.   Sore throat.   Dry cough.   Asthma-like symptoms including chest tightness, shortness of breath, or wheezing.  DIAGNOSIS  Your caregiver may be able to diagnose GERD based on your symptoms. In some cases, X-rays and other tests may be done to check for complications or to check the condition of your stomach and esophagus. TREATMENT  Your caregiver may recommend over-the-counter or prescription medicines to help decrease acid production. Ask your caregiver before starting or adding any new medicines.  HOME CARE INSTRUCTIONS   Change the factors that you can control. Ask your caregiver for guidance  concerning weight loss, quitting smoking, and alcohol consumption.   Avoid foods and drinks that make your symptoms worse, such as:   Caffeine or alcoholic drinks.   Chocolate.   Peppermint or mint flavorings.   Garlic and onions.   Spicy foods.   Citrus fruits, such as oranges, lemons, or limes.   Tomato-based  foods such as sauce, chili, salsa, and pizza.   Fried and fatty foods.   Avoid lying down for the 3 hours prior to your bedtime or prior to taking a nap.   Eat small, frequent meals instead of large meals.   Wear loose-fitting clothing. Do not wear anything tight around your waist that causes pressure on your stomach.   Raise the head of your bed 6 to 8 inches with wood blocks to help you sleep. Extra pillows will not help.   Only take over-the-counter or prescription medicines for pain, discomfort, or fever as directed by your caregiver.   Do not take aspirin, ibuprofen, or other nonsteroidal anti-inflammatory drugs (NSAIDs).  SEEK IMMEDIATE MEDICAL CARE IF:   You have pain in your arms, neck, jaw, teeth, or back.   Your pain increases or changes in intensity or duration.   You develop nausea, vomiting, or sweating (diaphoresis).   You develop shortness of breath, or you faint.   Your vomit is green, yellow, black, or looks like coffee grounds or blood.   Your stool is red, bloody, or black.  These symptoms could be signs of other problems, such as heart disease, gastric bleeding, or esophageal bleeding. MAKE SURE YOU:   Understand these instructions.   Will watch your condition.   Will get help right away if you are not doing well or get worse.  Document Released: 05/01/2005 Document Revised: 07/11/2011 Document Reviewed: 02/08/2011 [facility] Patient Information 2012 [place], [place].  Diet for GERD or PUD Nutrition therapy can help ease the discomfort of gastroesophageal reflux disease (GERD) and peptic ulcer disease (PUD).  HOME CARE INSTRUCTIONS   Eat your meals slowly, in a relaxed setting.   Eat 5 to 6 small meals per day.   If a food causes distress, stop eating it for a period of time.  FOODS TO AVOID  Coffee, regular or decaffeinated.   Cola beverages, regular or low calorie.   Tea, regular or decaffeinated.   Pepper.   Cocoa.   High fat foods,  including meats.   Butter, margarine, hydrogenated oil (trans fats).   Peppermint or spearmint (if you have GERD).   Fruits and vegetables if not tolerated.   Alcohol.   Nicotine (smoking or chewing). This is one of the most potent stimulants to acid production in the gastrointestinal tract.   Any food that seems to aggravate your condition.  If you have questions regarding your diet, ask your caregiver or a registered dietitian. TIPS  Lying flat may make symptoms worse. Keep the head of your bed raised 6 to 9 inches (15 to 23 cm) by using a foam wedge or blocks under the legs of the bed.   Do not lay down until 3 hours after eating a meal.   Daily physical activity may help reduce symptoms.  MAKE SURE YOU:   Understand these instructions.   Will watch your condition.   Will get help right away if you are not doing well or get worse.  Document Released: 07/22/2005 Document Revised: 07/11/2011 Document Reviewed: 06/07/2011 [facility] Patient Information 2012 [place], [place].

## 2011-10-31 ENCOUNTER — Encounter (HOSPITAL_COMMUNITY): Payer: Self-pay | Admitting: Internal Medicine

## 2011-11-05 ENCOUNTER — Encounter: Payer: Self-pay | Admitting: Internal Medicine

## 2012-12-31 ENCOUNTER — Encounter: Payer: Self-pay | Admitting: Cardiovascular Disease

## 2013-01-18 ENCOUNTER — Telehealth: Payer: Self-pay | Admitting: Cardiovascular Disease

## 2013-01-18 NOTE — Telephone Encounter (Signed)
Been waiting for Cholesterol results-it been about 2 months!

## 2013-01-18 NOTE — Telephone Encounter (Signed)
Message forwarded to W. Waddell, CMA.  

## 2013-01-20 NOTE — Telephone Encounter (Signed)
resutls to be shown to Dr. Tresa Endo for a second review and advice.

## 2013-01-20 NOTE — Telephone Encounter (Signed)
Returned call.  No answer/voicemail.  Will try later.  Henrene Pastor, CMA has results and stated Dr. Tresa Endo reviewed and did not give advice on what to do.  Message forwarded to W. Waddell, CMA.

## 2013-01-20 NOTE — Telephone Encounter (Signed)
Still waiting to hear from lab results-had this April!

## 2013-01-22 ENCOUNTER — Telehealth: Payer: Self-pay | Admitting: Cardiovascular Disease

## 2013-01-22 NOTE — Telephone Encounter (Signed)
Mr. Holt is calling because his lab work was done back in April and he has not heard from anyone about his results   Thanks

## 2013-01-22 NOTE — Telephone Encounter (Signed)
Message forwarded to W. Waddell, CMA.  

## 2013-05-10 ENCOUNTER — Ambulatory Visit (INDEPENDENT_AMBULATORY_CARE_PROVIDER_SITE_OTHER): Payer: Medicare Other | Admitting: Internal Medicine

## 2013-05-10 ENCOUNTER — Encounter: Payer: Self-pay | Admitting: Internal Medicine

## 2013-05-10 VITALS — BP 145/86 | HR 70 | Ht 71.0 in | Wt 206.0 lb

## 2013-05-10 DIAGNOSIS — R259 Unspecified abnormal involuntary movements: Secondary | ICD-10-CM

## 2013-05-10 DIAGNOSIS — R251 Tremor, unspecified: Secondary | ICD-10-CM

## 2013-05-10 DIAGNOSIS — E785 Hyperlipidemia, unspecified: Secondary | ICD-10-CM

## 2013-05-10 DIAGNOSIS — R55 Syncope and collapse: Secondary | ICD-10-CM

## 2013-05-10 DIAGNOSIS — I10 Essential (primary) hypertension: Secondary | ICD-10-CM

## 2013-05-10 NOTE — Patient Instructions (Addendum)
Your physician recommends that you schedule a follow-up appointment in:one year with Dr. Tenny Craw

## 2013-05-10 NOTE — Progress Notes (Signed)
Patient is a 72 yo who was previously followed by Bishop Limbo  He has a history of syncope with positive tilt table in 2003,  He has been treated wi th sertraline and b blocker.  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   He was last seen by Bishop Limbo in APril 2014  He cut back on propranolol to 20 bid  Added CoQ 10 for cramps.   Normal lexiscan myoview in 2012 Normal echo in 2012.  Mild MR.  Mild TR.    Patient's syncopal spells only happened in church  Gets about 1 min warning Warm, sweats, yawn  Out.  Treated for years   Knocking him down.   Stopped those meds.    Breathing OK  Allergies.  Not as sluggish  No CP   No dizziness  Allergies  Allergen Reactions  . Statins     Hard to walk, severe leg cramps  . Tetanus Toxoids     Swells up  . Penicillins Swelling and Rash  . Sulfa Antibiotics Swelling and Rash    Current Outpatient Prescriptions  Medication Sig Dispense Refill  . amLODipine (NORVASC) 5 MG tablet Take 10 mg by mouth daily.       Marland Kitchen aspirin 81 MG tablet Take 81 mg by mouth daily.      . Aspirin-Acetaminophen-Caffeine (EXCEDRIN PO) Take 1-2 tablets by mouth as needed. For pain      . AVODART 0.5 MG capsule Take 0.5 mg by mouth daily.       . celecoxib (CELEBREX) 100 MG capsule Take 100 mg by mouth daily.      . diclofenac sodium (VOLTAREN) 1 % GEL Apply 2 g topically 4 (four) times daily.      Marland Kitchen ezetimibe (ZETIA) 10 MG tablet Take 10 mg by mouth every other day.      . fish oil-omega-3 fatty acids 1000 MG capsule Take 2 g by mouth daily.      Marland Kitchen gabapentin (NEURONTIN) 300 MG capsule Take 300 mg by mouth 2 (two) times daily.      . Multiple Vitamin (MULITIVITAMIN WITH MINERALS) TABS Take 1 tablet by mouth daily.      Marland Kitchen SYNTHROID 25 MCG tablet Take 25 mcg by mouth daily.       . Tamsulosin HCl (FLOMAX) 0.4 MG CAPS Take 0.4 mg by mouth daily after supper.      . vitamin B-12 (CYANOCOBALAMIN) 1000 MCG tablet Take 1,000 mcg by mouth daily.      . Potassium 99  MG TABS Take 99 mg by mouth daily.       No current facility-administered medications for this visit.    Past Medical History  Diagnosis Date  . HTN (hypertension)   . Seasonal allergies   . Prostate cancer     Dr. Earlene Plater  . Neuropathy     Bilateral ankles  . Hypothyroidism   . Dysphagia     Past Surgical History  Procedure Laterality Date  . Tonsillectomy    . Appendectomy    . Colonoscopy  07/23/10    Dr. Elmer Ramp rectum, scattered pancolonic diverticula  . Colonoscopy  08/10/1999    internal hemorrhoids,inflammatory polyp  . Cholecystectomy    . Esophagogastroduodenoscopy  04/12/08    Prominent Schatzki's ring, erosive reflux esophagitis, multiple antral erosions, small hiatal hernia, reactive gastropathy, status post dilation with 23F  . Esophagogastroduodenoscopy  10/28/2011    Procedure: ESOPHAGOGASTRODUODENOSCOPY (EGD);  Surgeon: Corbin Ade, MD;  Location: [facility];  Service: Endoscopy;  Laterality: N/A;  1:30  . Savory dilation  10/28/2011    Procedure: SAVORY DILATION;  Surgeon: Corbin Ade, MD;  Location: [facility];  Service: Endoscopy;  Laterality: N/A;  Elease Hashimoto dilation  10/28/2011    Procedure: Elease Hashimoto DILATION;  Surgeon: Corbin Ade, MD;  Location: [facility];  Service: Endoscopy;  Laterality: N/A;    Family History  Problem Relation Age of Onset  . Prostate cancer Father   . Prostate cancer Son 92  . Prostate cancer Brother     History   Social History  . Marital Status: Married    Spouse Name: N/A    Number of Children: 2  . Years of Education: N/A   Occupational History  . retired; Corporate treasurer    Social History Main Topics  . Smoking status: Never Smoker   . Smokeless tobacco: Not on file  . Alcohol Use: Yes     Comment: 2 beers a week  . Drug Use: No  . Sexual Activity: Not on file   Other Topics Concern  . Not on file   Social History Narrative  . No narrative on file    Review of Systems:  All  systems reviewed.  They are negative to the above problem except as previously stated.  Vital Signs: BP 145/86  Pulse 70  Ht 5\' 11"  (1.803 m)  Wt 206 lb (93.441 kg)  BMI 28.74 kg/m2  Physical Exam Patient is in NAD HEENT:  Normocephalic, atraumatic. EOMI, PERRLA.  Neck: JVP is normal.  No bruits.  Lungs: clear to auscultation. No rales no wheezes.  Heart: Regular rate and rhythm. Normal S1, S2. No S3.   No significant murmurs. PMI not displaced.  Abdomen:  Supple, nontender. Normal bowel sounds. No masses. No hepatomegaly.  Extremities:   Good distal pulses throughout. No lower extremity edema.  Musculoskeletal :moving all extremities.  Neuro:   alert and oriented x3.  CN II-XII grossly intact.  EKG: 11/17/12:  SB 59 bpm  INcomp RBBB Assessment and Plan:   1.  Syncope.  Patient had syncope in past  Last several years ago  Only in church.  Doing well now.  Off meds for this  Feels better  More energy  Not dizzy.  Follow  Watch for sympotms.    2.  HL  On Zetia  Told had bad particles  Will review lipomed panels and get back with him  Continue for now  Congrat him on recent exercise  3.  HTN  Follow  Fair.    F/u 1 year.

## 2013-05-11 ENCOUNTER — Encounter: Payer: Self-pay | Admitting: Internal Medicine

## 2013-06-30 ENCOUNTER — Telehealth: Payer: Self-pay | Admitting: *Deleted

## 2013-06-30 NOTE — Telephone Encounter (Signed)
Noted labs scanned into chart, please advise any further instructions needed

## 2013-06-30 NOTE — Telephone Encounter (Signed)
PT is calling, states that Dr Tenny Craw was going to get more information from Dr Ellin Goodie office about labs that were done and get back with him. He still has not heard back and states that was about a month ago.

## 2013-07-30 ENCOUNTER — Ambulatory Visit: Payer: Medicare Other | Admitting: Internal Medicine

## 2013-11-09 ENCOUNTER — Encounter (HOSPITAL_COMMUNITY): Payer: Self-pay | Admitting: Pharmacy Technician

## 2013-11-15 ENCOUNTER — Ambulatory Visit (HOSPITAL_COMMUNITY)
Admission: RE | Admit: 2013-11-15 | Discharge: 2013-11-15 | Disposition: A | Discharge status: Home / Self Care | Payer: Medicare Other | Source: Ambulatory Visit | Attending: Anesthesiology | Admitting: Anesthesiology

## 2013-11-15 ENCOUNTER — Encounter (HOSPITAL_COMMUNITY): Payer: Self-pay

## 2013-11-15 ENCOUNTER — Encounter (HOSPITAL_COMMUNITY)
Admission: RE | Admit: 2013-11-15 | Discharge: 2013-11-15 | Disposition: A | Discharge status: Home / Self Care | Payer: Medicare Other | Source: Ambulatory Visit | Attending: Orthopedic Surgery | Admitting: Orthopedic Surgery

## 2013-11-15 DIAGNOSIS — Z01810 Encounter for preprocedural cardiovascular examination: Secondary | ICD-10-CM | POA: Insufficient documentation

## 2013-11-15 DIAGNOSIS — Z01818 Encounter for other preprocedural examination: Secondary | ICD-10-CM | POA: Insufficient documentation

## 2013-11-15 DIAGNOSIS — Z01812 Encounter for preprocedural laboratory examination: Secondary | ICD-10-CM | POA: Insufficient documentation

## 2013-11-15 HISTORY — DX: Gastro-esophageal reflux disease without esophagitis: K21.9

## 2013-11-15 HISTORY — DX: Syncope and collapse: R55

## 2013-11-15 LAB — BASIC METABOLIC PANEL
BUN: 16 mg/dL (ref 6–23)
CALCIUM: 9.8 mg/dL (ref 8.4–10.5)
CO2: 27 mEq/L (ref 19–32)
Chloride: 107 mEq/L (ref 96–112)
Creatinine, Ser: 1.07 mg/dL (ref 0.50–1.35)
GFR calc non Af Amer: 66 mL/min — ABNORMAL LOW (ref >90)
GFR, EST AFRICAN AMERICAN: 77 mL/min — AB (ref >90)
GLUCOSE: 99 mg/dL (ref 70–99)
POTASSIUM: 4.7 meq/L (ref 3.7–5.3)
SODIUM: 146 meq/L (ref 137–147)

## 2013-11-15 LAB — CBC
HCT: 43.8 % (ref 39.0–52.0)
HEMOGLOBIN: 15.6 g/dL (ref 13.0–17.0)
MCH: 31.4 pg (ref 26.0–34.0)
MCHC: 35.6 g/dL (ref 30.0–36.0)
MCV: 88.1 fL (ref 78.0–100.0)
Platelets: 202 10*3/uL (ref 150–400)
RBC: 4.97 MIL/uL (ref 4.22–5.81)
RDW: 13.1 % (ref 11.5–15.5)
WBC: 8.2 10*3/uL (ref 4.0–10.5)

## 2013-11-15 NOTE — Progress Notes (Signed)
11/15/13 1030  OBSTRUCTIVE SLEEP APNEA  Have you ever been diagnosed with sleep apnea through a sleep study? Yes (sleep study done 5-6 yrs. ago  "inconclusive" reports pt.)  If yes, do you have and use a CPAP or BPAP machine every night? 0  Do you snore loudly (loud enough to be heard through closed doors)?  1  Do you often feel tired, fatigued, or sleepy during the daytime? 0  Has anyone observed you stop breathing during your sleep? 0  Do you have, or are you being treated for high blood pressure? 1  BMI more than 35 kg/m2? 0  Age over 76 years old? 1  Neck circumference greater than 40 cm/18 inches? 0  Gender: 1  Obstructive Sleep Apnea Score 4  Score 4 or greater  Results sent to PCP

## 2013-11-15 NOTE — Progress Notes (Addendum)
No orders in epic, notified  Dr. Nona Dell office.  Cardiologist: Dr. Veneda Melter [facility]) [phone]  Primary: Dr. Gerarda Fraction  At [facility] in [place]

## 2013-11-15 NOTE — Pre-Procedure Instructions (Signed)
Brent Wilkins  11/15/2013   Your procedure is scheduled on: Thursday, November 25, 2013 at 1:00 PM  Report to [facility] Short Stay (use Main Entrance "A'') at 11:00 AM.  Call this number if you have problems the morning of surgery: [phone]   Remember:   Do not eat food or drink liquids after midnight Wednesday, November 24, 2013   Take these medicines the morning of surgery with A SIP OF WATER: amLODipine (NORVASC), dutasteride (AVODART), gabapentin (NEURONTIN), levothyroxine (SYNTHROID, LEVOTHROID), Tamsulosin HCl (FLOMAX)  Stop taking Aspirin, multivitamins and herbal medications ( Omega-3 Fatty Acids (FISH OIL) Do not take any NSAIDs ie: Ibuprofen, Advil, Naproxen or any medication containing Aspirin  (EXCEDRIN PO) celecoxib (CELEBREX), diclofenac sodium (VOLTAREN) 1 % GEL. Stop 5 days prior to procedure, Saturday, 11/20/13   Do not wear jewelry, make-up or nail polish.  Do not wear lotions, powders, or perfumes. You may wear deodorant.  Do not shave 48 hours prior to surgery. Men may shave face and neck.  Do not bring valuables to the hospital.  [facility] is not responsible  for any belongings or valuables.               Contacts, dentures or bridgework may not be worn into surgery.  Leave suitcase in the car. After surgery it may be brought to your room.  For patients admitted to the hospital, discharge time is determined by your treatment team.               Patients discharged the day of surgery will not be allowed to drive home.  Name and phone number of your driver:   Special Instructions:  Special Instructions:Special Instructions: [facility] - Preparing for Surgery  Before surgery, you can play an important role.  Because skin is not sterile, your skin needs to be as free of germs as possible.  You can reduce the number of germs on you skin by washing with CHG (chlorahexidine gluconate) soap before surgery.  CHG is an antiseptic cleaner which kills germs and bonds with the  skin to continue killing germs even after washing.  Please DO NOT use if you have an allergy to CHG or antibacterial soaps.  If your skin becomes reddened/irritated stop using the CHG and inform your nurse when you arrive at Short Stay.  Do not shave (including legs and underarms) for at least 48 hours prior to the first CHG shower.  You may shave your face.  Please follow these instructions carefully:   1.  Shower with CHG Soap the night before surgery and the morning of Surgery.  2.  If you choose to wash your hair, wash your hair first as usual with your normal shampoo.  3.  After you shampoo, rinse your hair and body thoroughly to remove the Shampoo.  4.  Use CHG as you would any other liquid soap.  You can apply chg directly  to the skin and wash gently with scrungie or a clean washcloth.  5.  Apply the CHG Soap to your body ONLY FROM THE NECK DOWN.  Do not use on open wounds or open sores.  Avoid contact with your eyes, ears, mouth and genitals (private parts).  Wash genitals (private parts) with your normal soap.  6.  Wash thoroughly, paying special attention to the area where your surgery will be performed.  7.  Thoroughly rinse your body with warm water from the neck down.  8.  DO NOT shower/wash with your  normal soap after using and rinsing off the CHG Soap.  9.  Pat yourself dry with a clean towel.            10.  Wear clean pajamas.            11.  Place clean sheets on your bed the night of your first shower and do not sleep with pets.  Day of Surgery  Do not apply any lotions the morning of surgery.  Please wear clean clothes to the hospital/surgery center.   Please read over the following fact sheets that you were given: Pain Booklet, Coughing and Deep Breathing and Surgical Site Infection Prevention

## 2013-11-17 NOTE — Progress Notes (Signed)
Anesthesia chart review: Patient is a 73 year old male posted for left total ankle arthroscopy on 11/25/13 by Dr. Doran Durand. Orders are pending.  History includes HTN, hypothyroidism, GERD, prostate cancer (Dr. Rosana Hoes), syncope with positive tilt table in 2003, dysphagia, neuropathy (ankles). OSA screening score was 4. Cardiologist is Dr. Dorris Carnes, last visit 05/2013 with one year follow-up recommended. PCP is Dr. Gerarda Fraction at [facility]. No Vitals recorded from PAT.  Echocardiogram on 06/04/2011 showed: Mild concentric LVH, normal LV systolic function, EF greater than 55%, transmitral septal Doppler flow pattern is normal for age, LA is mildly dilated, RA is mildly dilated. Normal right ventricular systolic pressure, no significant valvular disease (mild MR/TR).  Nuclear stress test on 06/04/11 showed: Normal myocardial perfusion study. Poststress EF 63%. No significant wall motion abnormalities noted.  EKG on 11/15/2013 showed sinus rhythm with PACs, incomplete right bundle branch block. Overall, EKG appears stable since 11/17/12.  Preoperative CXR and labs noted.    Patient with cardiology follow-up within the past year and appeared to be doing well at that time.  Further evaluation by his assigned anesthesiologist on the day of surgery.  If no acute changes then I would anticipate that he could proceed as planned.    George [facility] Stay [facility]/Anesthesiology Phone [phone] 11/17/2013 12:47 PM

## 2013-11-24 ENCOUNTER — Other Ambulatory Visit: Payer: Self-pay | Admitting: Orthopedic Surgery

## 2013-11-24 MED ORDER — SODIUM CHLORIDE 0.9 % IV SOLN: INTRAVENOUS | Status: DC

## 2013-11-24 MED ORDER — CHLORHEXIDINE GLUCONATE 4 % EX LIQD
60.0000 mL | Freq: Once | CUTANEOUS | Status: DC
  Filled 2013-11-24: qty 60

## 2013-11-24 MED ORDER — VANCOMYCIN HCL IN DEXTROSE 1-5 GM/200ML-% IV SOLN
1000.0000 mg | INTRAVENOUS | Status: AC
  Administered 2013-11-25: 1000 mg via INTRAVENOUS
  Filled 2013-11-24: qty 200

## 2013-11-24 NOTE — Progress Notes (Signed)
Spoke with pt and informed him of surgical time change and to arrive at 12:15.  Pt states understanding.

## 2013-11-24 NOTE — H&P (Signed)
Brent Wilkins is an 73 y.o. male.   Chief Complaint: Left ankle pain HPI: Pt reports to OR for left ankle replacement.  Pt has failed conservative treatment for left arthritis and presents today for surgical correction of the arthritis.  Pt denies N/V/F/C, chest pain, SOB, calf pain and paresthesia b/l.   Past Medical History  Diagnosis Date  . HTN (hypertension)   . Seasonal allergies   . Prostate cancer     Dr. Rosana Hoes  . Neuropathy     Bilateral ankles  . Hypothyroidism   . Dysphagia   . Syncope  dx. 8 yrs ago  . GERD (gastroesophageal reflux disease)     Past Surgical History  Procedure Laterality Date  . Tonsillectomy    . Appendectomy    . Colonoscopy  07/23/10    Dr. Vivi Ferns rectum, scattered pancolonic diverticula  . Colonoscopy  08/10/1999    internal hemorrhoids,inflammatory polyp  . Cholecystectomy    . Esophagogastroduodenoscopy  04/12/08    Prominent Schatzki's ring, erosive reflux esophagitis, multiple antral erosions, small hiatal hernia, reactive gastropathy, status post dilation with 19F  . Esophagogastroduodenoscopy  10/28/2011    Procedure: ESOPHAGOGASTRODUODENOSCOPY (EGD);  Surgeon: Daneil Dolin, MD;  Location: [facility];  Service: Endoscopy;  Laterality: N/A;  1:30  . Savory dilation  10/28/2011    Procedure: SAVORY DILATION;  Surgeon: Daneil Dolin, MD;  Location: [facility];  Service: Endoscopy;  Laterality: N/A;  Venia Minks dilation  10/28/2011    Procedure: Venia Minks DILATION;  Surgeon: Daneil Dolin, MD;  Location: [facility];  Service: Endoscopy;  Laterality: N/A;    Family History  Problem Relation Age of Onset  . Prostate cancer Father   . Prostate cancer Son 41  . Prostate cancer Brother    Social History:  reports that he has never smoked. He does not have any smokeless tobacco history on file. He reports that he drinks alcohol. He reports that he does not use illicit drugs.  Allergies:  Allergies  Allergen Reactions  . Benadryl  [Diphenhydramine]     "Makes me foggy"  . Statins     Hard to walk, severe leg cramps  . Tetanus Toxoids     Swells up  . Penicillins Swelling and Rash  . Sulfa Antibiotics Swelling and Rash    No prescriptions prior to admission    No results found for this or any previous visit (from the past 48 hour(s)). No results found.  Review of Systems  Constitutional: Negative.   HENT: Negative.   Eyes: Negative.   Respiratory: Negative.   Cardiovascular: Negative.   Gastrointestinal: Negative.   Musculoskeletal: Negative.   Skin: Negative.   Neurological: Negative for focal weakness.  Endo/Heme/Allergies: Negative.   Psychiatric/Behavioral: The patient is not nervous/anxious.     There were no vitals taken for this visit. Physical Exam  WD WN NAD A/Ox3, appears stated age. EOMI, mood and affect normal, respirations unlabored.  Gait is antalgic bilaterally. On standing exam he has plantigrade feet. Valgus alignment of b/l ankles noted on exam. Reduced ROM b/l with strength 5/5 of DF PF inversion eversion.  DP pulse 2+ b/l. Normal sensation to light touch intact. Skin healthy and intact, no lymphadenopathy noted.  Assessment/Plan Pt reports to OR for left total ankle replacement.   [name] 11/24/2013, 12:06 PM  Agree with note above.  To OR for left total ankle replacement.  The risks and benefits of the alternative treatment options  have been discussed in detail.  The patient wishes to proceed with surgery and specifically understands risks of bleeding, infection, nerve damage, blood clots, need for additional surgery, amputation and death.

## 2013-11-25 ENCOUNTER — Encounter (HOSPITAL_COMMUNITY): Payer: Medicare Other | Admitting: Vascular Surgery

## 2013-11-25 ENCOUNTER — Inpatient Hospital Stay (HOSPITAL_COMMUNITY)
Admission: RE | Admit: 2013-11-25 | Discharge: 2013-11-26 | DRG: 470 | Disposition: A | Discharge status: Home / Self Care | Payer: Medicare Other | Source: Ambulatory Visit | Attending: Orthopedic Surgery | Admitting: Orthopedic Surgery

## 2013-11-25 ENCOUNTER — Inpatient Hospital Stay (HOSPITAL_COMMUNITY): Payer: Medicare Other | Admitting: Anesthesiology

## 2013-11-25 ENCOUNTER — Encounter (HOSPITAL_COMMUNITY): Payer: Self-pay | Admitting: *Deleted

## 2013-11-25 ENCOUNTER — Encounter (HOSPITAL_COMMUNITY)
Admission: RE | Disposition: A | Discharge status: Home / Self Care | Payer: Self-pay | Source: Ambulatory Visit | Attending: Orthopedic Surgery

## 2013-11-25 ENCOUNTER — Inpatient Hospital Stay (HOSPITAL_COMMUNITY): Payer: Medicare Other

## 2013-11-25 DIAGNOSIS — I10 Essential (primary) hypertension: Secondary | ICD-10-CM | POA: Diagnosis present

## 2013-11-25 DIAGNOSIS — K219 Gastro-esophageal reflux disease without esophagitis: Secondary | ICD-10-CM | POA: Diagnosis present

## 2013-11-25 DIAGNOSIS — E039 Hypothyroidism, unspecified: Secondary | ICD-10-CM | POA: Diagnosis present

## 2013-11-25 DIAGNOSIS — M19079 Primary osteoarthritis, unspecified ankle and foot: Principal | ICD-10-CM | POA: Diagnosis present

## 2013-11-25 DIAGNOSIS — Z8546 Personal history of malignant neoplasm of prostate: Secondary | ICD-10-CM

## 2013-11-25 DIAGNOSIS — M19072 Primary osteoarthritis, left ankle and foot: Secondary | ICD-10-CM

## 2013-11-25 DIAGNOSIS — Z8042 Family history of malignant neoplasm of prostate: Secondary | ICD-10-CM

## 2013-11-25 HISTORY — PX: TOTAL ANKLE ARTHROPLASTY: SHX811

## 2013-11-25 SURGERY — ARTHROPLASTY, ANKLE, TOTAL
Anesthesia: General | Site: Ankle | Laterality: Left

## 2013-11-25 MED ORDER — OXYCODONE HCL 5 MG/5ML PO SOLN: 5.0000 mg | Freq: Once | ORAL | Status: DC | PRN

## 2013-11-25 MED ORDER — LEVOTHYROXINE SODIUM 25 MCG PO TABS
25.0000 ug | ORAL_TABLET | Freq: Every day | ORAL | Status: DC
  Administered 2013-11-26: 25 ug via ORAL
  Filled 2013-11-25 (×2): qty 1

## 2013-11-25 MED ORDER — LIDOCAINE HCL (CARDIAC) 20 MG/ML IV SOLN
INTRAVENOUS | Status: DC | PRN
  Administered 2013-11-25: 90 mg via INTRAVENOUS

## 2013-11-25 MED ORDER — NEOSTIGMINE METHYLSULFATE 1 MG/ML IJ SOLN
INTRAMUSCULAR | Status: DC | PRN
  Administered 2013-11-25: 4 mg via INTRAVENOUS

## 2013-11-25 MED ORDER — GLYCOPYRROLATE 0.2 MG/ML IJ SOLN
INTRAMUSCULAR | Status: AC
  Filled 2013-11-25: qty 3

## 2013-11-25 MED ORDER — PHENYLEPHRINE 40 MCG/ML (10ML) SYRINGE FOR IV PUSH (FOR BLOOD PRESSURE SUPPORT)
PREFILLED_SYRINGE | INTRAVENOUS | Status: AC
  Filled 2013-11-25: qty 10

## 2013-11-25 MED ORDER — OXYCODONE HCL 5 MG PO TABS
5.0000 mg | ORAL_TABLET | ORAL | Status: DC | PRN
  Administered 2013-11-25 – 2013-11-26 (×4): 10 mg via ORAL
  Filled 2013-11-25 (×4): qty 2

## 2013-11-25 MED ORDER — DEXTROSE 5 % IV SOLN
INTRAVENOUS | Status: DC | PRN
  Administered 2013-11-25: 14:00:00 via INTRAVENOUS

## 2013-11-25 MED ORDER — LIDOCAINE HCL (CARDIAC) 20 MG/ML IV SOLN
INTRAVENOUS | Status: AC
  Filled 2013-11-25: qty 5

## 2013-11-25 MED ORDER — LACTATED RINGERS IV SOLN
INTRAVENOUS | Status: DC | PRN
  Administered 2013-11-25 (×2): via INTRAVENOUS

## 2013-11-25 MED ORDER — PROPOFOL 10 MG/ML IV BOLUS
INTRAVENOUS | Status: AC
  Filled 2013-11-25: qty 20

## 2013-11-25 MED ORDER — 0.9 % SODIUM CHLORIDE (POUR BTL) OPTIME
TOPICAL | Status: DC | PRN
  Administered 2013-11-25: 1000 mL

## 2013-11-25 MED ORDER — ONDANSETRON HCL 4 MG/2ML IJ SOLN
INTRAMUSCULAR | Status: DC | PRN
  Administered 2013-11-25: 4 mg via INTRAVENOUS

## 2013-11-25 MED ORDER — HYDROMORPHONE HCL PF 1 MG/ML IJ SOLN: 0.2500 mg | INTRAMUSCULAR | Status: DC | PRN

## 2013-11-25 MED ORDER — OXYCODONE HCL 5 MG PO TABS: 5.0000 mg | ORAL_TABLET | Freq: Once | ORAL | Status: DC | PRN

## 2013-11-25 MED ORDER — GLYCOPYRROLATE 0.2 MG/ML IJ SOLN
INTRAMUSCULAR | Status: DC | PRN
  Administered 2013-11-25: 0.6 mg via INTRAVENOUS

## 2013-11-25 MED ORDER — VITAMIN B-12 1000 MCG PO TABS
1000.0000 ug | ORAL_TABLET | Freq: Every day | ORAL | Status: DC
  Administered 2013-11-25 – 2013-11-26 (×2): 1000 ug via ORAL
  Filled 2013-11-25 (×2): qty 1

## 2013-11-25 MED ORDER — DUTASTERIDE 0.5 MG PO CAPS
0.5000 mg | ORAL_CAPSULE | Freq: Every day | ORAL | Status: DC
  Administered 2013-11-26: 0.5 mg via ORAL
  Filled 2013-11-25: qty 1

## 2013-11-25 MED ORDER — ENOXAPARIN SODIUM 30 MG/0.3ML ~~LOC~~ SOLN
30.0000 mg | SUBCUTANEOUS | Status: DC
  Administered 2013-11-25: 30 mg via SUBCUTANEOUS
  Filled 2013-11-25 (×2): qty 0.3

## 2013-11-25 MED ORDER — LACTATED RINGERS IV SOLN
INTRAVENOUS | Status: DC
  Administered 2013-11-25: 13:00:00 via INTRAVENOUS

## 2013-11-25 MED ORDER — MIDAZOLAM HCL 2 MG/2ML IJ SOLN
1.0000 mg | INTRAMUSCULAR | Status: DC | PRN
  Administered 2013-11-25: 1 mg via INTRAVENOUS
  Filled 2013-11-25: qty 2

## 2013-11-25 MED ORDER — FENTANYL CITRATE 0.05 MG/ML IJ SOLN
50.0000 ug | INTRAMUSCULAR | Status: DC | PRN
  Administered 2013-11-25: 100 ug via INTRAVENOUS
  Filled 2013-11-25: qty 2

## 2013-11-25 MED ORDER — SENNA 8.6 MG PO TABS
1.0000 | ORAL_TABLET | Freq: Two times a day (BID) | ORAL | Status: DC
  Administered 2013-11-25 – 2013-11-26 (×2): 8.6 mg via ORAL
  Filled 2013-11-25 (×3): qty 1

## 2013-11-25 MED ORDER — METOCLOPRAMIDE HCL 5 MG/ML IJ SOLN: 5.0000 mg | Freq: Three times a day (TID) | INTRAMUSCULAR | Status: DC | PRN

## 2013-11-25 MED ORDER — ADULT MULTIVITAMIN W/MINERALS CH
1.0000 | ORAL_TABLET | Freq: Every day | ORAL | Status: DC
  Administered 2013-11-25 – 2013-11-26 (×2): 1 via ORAL
  Filled 2013-11-25 (×2): qty 1

## 2013-11-25 MED ORDER — ARTIFICIAL TEARS OP OINT
TOPICAL_OINTMENT | OPHTHALMIC | Status: AC
  Filled 2013-11-25: qty 3.5

## 2013-11-25 MED ORDER — FLUTICASONE PROPIONATE 50 MCG/ACT NA SUSP
1.0000 | Freq: Every day | NASAL | Status: DC | PRN
  Filled 2013-11-25: qty 16

## 2013-11-25 MED ORDER — MIDAZOLAM HCL 2 MG/2ML IJ SOLN
INTRAMUSCULAR | Status: AC
  Filled 2013-11-25: qty 2

## 2013-11-25 MED ORDER — DOCUSATE SODIUM 100 MG PO CAPS
100.0000 mg | ORAL_CAPSULE | Freq: Two times a day (BID) | ORAL | Status: DC
  Administered 2013-11-25 – 2013-11-26 (×2): 100 mg via ORAL
  Filled 2013-11-25 (×3): qty 1

## 2013-11-25 MED ORDER — FENTANYL CITRATE 0.05 MG/ML IJ SOLN
INTRAMUSCULAR | Status: DC | PRN
  Administered 2013-11-25: 100 ug via INTRAVENOUS
  Administered 2013-11-25: 50 ug via INTRAVENOUS

## 2013-11-25 MED ORDER — BACITRACIN ZINC 500 UNIT/GM EX OINT
TOPICAL_OINTMENT | CUTANEOUS | Status: AC
  Filled 2013-11-25: qty 15

## 2013-11-25 MED ORDER — MORPHINE SULFATE 2 MG/ML IJ SOLN: 1.0000 mg | INTRAMUSCULAR | Status: DC | PRN

## 2013-11-25 MED ORDER — VANCOMYCIN HCL IN DEXTROSE 1-5 GM/200ML-% IV SOLN
1000.0000 mg | Freq: Two times a day (BID) | INTRAVENOUS | Status: AC
  Administered 2013-11-26: 1000 mg via INTRAVENOUS
  Filled 2013-11-25: qty 200

## 2013-11-25 MED ORDER — AMLODIPINE BESYLATE 10 MG PO TABS
10.0000 mg | ORAL_TABLET | Freq: Every day | ORAL | Status: DC
  Administered 2013-11-26: 10 mg via ORAL
  Filled 2013-11-25: qty 1

## 2013-11-25 MED ORDER — METOCLOPRAMIDE HCL 10 MG PO TABS: 5.0000 mg | ORAL_TABLET | Freq: Three times a day (TID) | ORAL | Status: DC | PRN

## 2013-11-25 MED ORDER — ROCURONIUM BROMIDE 50 MG/5ML IV SOLN
INTRAVENOUS | Status: AC
  Filled 2013-11-25: qty 1

## 2013-11-25 MED ORDER — ARTIFICIAL TEARS OP OINT
TOPICAL_OINTMENT | OPHTHALMIC | Status: DC | PRN
  Administered 2013-11-25: 1 via OPHTHALMIC

## 2013-11-25 MED ORDER — ROCURONIUM BROMIDE 100 MG/10ML IV SOLN
INTRAVENOUS | Status: DC | PRN
  Administered 2013-11-25: 50 mg via INTRAVENOUS

## 2013-11-25 MED ORDER — GABAPENTIN 300 MG PO CAPS
300.0000 mg | ORAL_CAPSULE | Freq: Two times a day (BID) | ORAL | Status: DC
  Administered 2013-11-25 – 2013-11-26 (×2): 300 mg via ORAL
  Filled 2013-11-25 (×3): qty 1

## 2013-11-25 MED ORDER — PHENYLEPHRINE HCL 10 MG/ML IJ SOLN
INTRAMUSCULAR | Status: DC | PRN
  Administered 2013-11-25 (×2): 80 ug via INTRAVENOUS

## 2013-11-25 MED ORDER — PANTOPRAZOLE SODIUM 40 MG PO TBEC
40.0000 mg | DELAYED_RELEASE_TABLET | Freq: Every day | ORAL | Status: DC
  Administered 2013-11-26: 40 mg via ORAL
  Filled 2013-11-25: qty 1

## 2013-11-25 MED ORDER — ONDANSETRON HCL 4 MG PO TABS
4.0000 mg | ORAL_TABLET | Freq: Four times a day (QID) | ORAL | Status: DC | PRN
Start: 2013-11-25 — End: 2013-11-26

## 2013-11-25 MED ORDER — ONDANSETRON HCL 4 MG/2ML IJ SOLN: 4.0000 mg | Freq: Four times a day (QID) | INTRAMUSCULAR | Status: DC | PRN

## 2013-11-25 MED ORDER — PROMETHAZINE HCL 25 MG/ML IJ SOLN: 6.2500 mg | INTRAMUSCULAR | Status: DC | PRN

## 2013-11-25 MED ORDER — MIDAZOLAM HCL 5 MG/5ML IJ SOLN
INTRAMUSCULAR | Status: DC | PRN
  Administered 2013-11-25: 1 mg via INTRAVENOUS

## 2013-11-25 MED ORDER — FENTANYL CITRATE 0.05 MG/ML IJ SOLN
INTRAMUSCULAR | Status: AC
  Filled 2013-11-25: qty 5

## 2013-11-25 MED ORDER — TAMSULOSIN HCL 0.4 MG PO CAPS
0.4000 mg | ORAL_CAPSULE | Freq: Every day | ORAL | Status: DC
  Administered 2013-11-26: 0.4 mg via ORAL
  Filled 2013-11-25 (×2): qty 1

## 2013-11-25 MED ORDER — ONDANSETRON HCL 4 MG/2ML IJ SOLN
INTRAMUSCULAR | Status: AC
  Filled 2013-11-25: qty 2

## 2013-11-25 MED ORDER — CELECOXIB 200 MG PO CAPS
200.0000 mg | ORAL_CAPSULE | Freq: Every day | ORAL | Status: DC
Start: 2013-11-25 — End: 2013-11-26
  Administered 2013-11-25 – 2013-11-26 (×2): 200 mg via ORAL
  Filled 2013-11-25 (×2): qty 1

## 2013-11-25 MED ORDER — PROPOFOL 10 MG/ML IV BOLUS
INTRAVENOUS | Status: DC | PRN
  Administered 2013-11-25: 200 mg via INTRAVENOUS

## 2013-11-25 SURGICAL SUPPLY — 60 items
BANDAGE ESMARK 6X9 LF (GAUZE/BANDAGES/DRESSINGS) ×1 IMPLANT
BLADE RECIP (BLADE) ×3 IMPLANT
BLADE RECIPRO TAPERED (BLADE) ×3 IMPLANT
BLADE SAW (BLADE) ×3 IMPLANT
BLADE SURG 15 STRL LF DISP TIS (BLADE) ×2 IMPLANT
BLADE SURG 15 STRL SS (BLADE) ×4
BNDG ESMARK 6X9 LF (GAUZE/BANDAGES/DRESSINGS) ×3
CANISTER SUCT 3000ML (MISCELLANEOUS) ×3 IMPLANT
CHLORAPREP W/TINT 26ML (MISCELLANEOUS) ×3 IMPLANT
CORE SLIDING STAR SZ 9MM (Orthopedic Implant) ×3 IMPLANT
COVER SURGICAL LIGHT HANDLE (MISCELLANEOUS) ×3 IMPLANT
CUFF TOURNIQUET SINGLE 34IN LL (TOURNIQUET CUFF) ×3 IMPLANT
CUFF TOURNIQUET SINGLE 44IN (TOURNIQUET CUFF) IMPLANT
DISPOSABLES PACK SBI (PACKS) ×3 IMPLANT
DRAPE C-ARM 42X72 X-RAY (DRAPES) ×3 IMPLANT
DRAPE C-ARMOR (DRAPES) ×3 IMPLANT
DRAPE EXTREMITY T 121X128X90 (DRAPE) ×3 IMPLANT
DRAPE ORTHO SPLIT 77X108 STRL (DRAPES) ×2
DRAPE SURG ORHT 6 SPLT 77X108 (DRAPES) ×1 IMPLANT
DRAPE U-SHAPE 47X51 STRL (DRAPES) ×3 IMPLANT
DRSG MEPILEX BORDER 4X4 (GAUZE/BANDAGES/DRESSINGS) ×3 IMPLANT
DRSG MEPITEL 4X7.2 (GAUZE/BANDAGES/DRESSINGS) ×3 IMPLANT
DRSG PAD ABDOMINAL 8X10 ST (GAUZE/BANDAGES/DRESSINGS) ×3 IMPLANT
ELECT REM PT RETURN 9FT ADLT (ELECTROSURGICAL) ×3
ELECTRODE REM PT RTRN 9FT ADLT (ELECTROSURGICAL) ×1 IMPLANT
EVACUATOR 1/8 PVC DRAIN (DRAIN) ×3 IMPLANT
GLOVE BIO SURGEON STRL SZ7 (GLOVE) ×3 IMPLANT
GLOVE BIO SURGEON STRL SZ8 (GLOVE) ×3 IMPLANT
GLOVE BIOGEL PI IND STRL 7.5 (GLOVE) ×1 IMPLANT
GLOVE BIOGEL PI IND STRL 8 (GLOVE) ×1 IMPLANT
GLOVE BIOGEL PI INDICATOR 7.5 (GLOVE) ×2
GLOVE BIOGEL PI INDICATOR 8 (GLOVE) ×2
GLOVE ORTHO TXT STRL SZ7.5 (GLOVE) ×3 IMPLANT
GOWN STRL REUS W/ TWL LRG LVL3 (GOWN DISPOSABLE) ×2 IMPLANT
GOWN STRL REUS W/ TWL XL LVL3 (GOWN DISPOSABLE) ×1 IMPLANT
GOWN STRL REUS W/TWL LRG LVL3 (GOWN DISPOSABLE) ×4
GOWN STRL REUS W/TWL XL LVL3 (GOWN DISPOSABLE) ×2
IMPLANT TALAR STAR SZ S LF (Orthopedic Implant) ×3 IMPLANT
IMPLANT TIBIAL STAR SZ L (Orthopedic Implant) ×3 IMPLANT
KIT BASIN OR (CUSTOM PROCEDURE TRAY) ×3 IMPLANT
KIT ROOM TURNOVER OR (KITS) ×3 IMPLANT
NS IRRIG 1000ML POUR BTL (IV SOLUTION) ×3 IMPLANT
PACK TOTAL JOINT (CUSTOM PROCEDURE TRAY) ×3 IMPLANT
PAD ARMBOARD 7.5X6 YLW CONV (MISCELLANEOUS) ×6 IMPLANT
PAD CAST 4YDX4 CTTN HI CHSV (CAST SUPPLIES) ×1 IMPLANT
PADDING CAST COTTON 4X4 STRL (CAST SUPPLIES) ×2
PADDING CAST COTTON 6X4 STRL (CAST SUPPLIES) ×3 IMPLANT
SPLINT FIBERGLASS 4X15 (CAST SUPPLIES) ×3 IMPLANT
SPONGE GAUZE 4X4 12PLY (GAUZE/BANDAGES/DRESSINGS) ×6 IMPLANT
SPONGE GAUZE 4X4 12PLY STER LF (GAUZE/BANDAGES/DRESSINGS) ×3 IMPLANT
SUCTION FRAZIER TIP 10 FR DISP (SUCTIONS) ×3 IMPLANT
SURGILUBE 3G PEEL PACK STRL (MISCELLANEOUS) ×3 IMPLANT
SUT ETHILON 3 0 PS 1 (SUTURE) ×3 IMPLANT
SUT MNCRL AB 3-0 PS2 18 (SUTURE) ×6 IMPLANT
SUT PROLENE 3 0 PS 2 (SUTURE) ×3 IMPLANT
SUT VIC AB 0 CT1 27 (SUTURE) ×4
SUT VIC AB 0 CT1 27XBRD ANBCTR (SUTURE) ×2 IMPLANT
TOWEL OR 17X24 6PK STRL BLUE (TOWEL DISPOSABLE) ×3 IMPLANT
TOWEL OR 17X26 10 PK STRL BLUE (TOWEL DISPOSABLE) ×3 IMPLANT
WATER STERILE IRR 1000ML POUR (IV SOLUTION) ×3 IMPLANT

## 2013-11-25 NOTE — Transfer of Care (Signed)
Immediate Anesthesia Transfer of Care Note  Patient: Brent Wilkins  Procedure(s) Performed: Procedure(s): TOTAL ANKLE ARTHOPLASTY (Left)  Patient Location: PACU  Anesthesia Type:GA combined with regional for post-op pain  Level of Consciousness: awake  Airway & Oxygen Therapy: Patient Spontanous Breathing and Patient connected to face mask oxygen  Post-op Assessment: Report given to PACU RN and Post -op Vital signs reviewed and stable  Post vital signs: Reviewed and stable  Complications: No apparent anesthesia complications

## 2013-11-25 NOTE — Anesthesia Preprocedure Evaluation (Addendum)
Anesthesia Evaluation  Patient identified by MRN, date of birth, ID band Patient awake    Reviewed: Allergy & Precautions, H&P , NPO status , Patient's Chart, lab work & pertinent test results, reviewed documented beta blocker date and time   History of Anesthesia Complications Negative for: history of anesthetic complications  Airway Mallampati: I TM Distance: >3 FB Neck ROM: Limited    Dental  (+) Teeth Intact, Dental Advisory Given   Pulmonary  breath sounds clear to auscultation        Cardiovascular hypertension, Pt. on medications Rhythm:Regular Rate:Normal     Neuro/Psych negative neurological ROS     GI/Hepatic Neg liver ROS, GERD-  Medicated and Controlled,  Endo/Other  Hypothyroidism   Renal/GU negative Renal ROS     Musculoskeletal   Abdominal   Peds  Hematology negative hematology ROS (+)   Anesthesia Other Findings   Reproductive/Obstetrics                          Anesthesia Physical Anesthesia Plan  ASA: II  Anesthesia Plan: General   Post-op Pain Management:    Induction: Intravenous  Airway Management Planned: Oral ETT  Additional Equipment:   Intra-op Plan:   Post-operative Plan: Extubation in OR  Informed Consent:   Plan Discussed with:   Anesthesia Plan Comments:         Anesthesia Quick Evaluation

## 2013-11-25 NOTE — Brief Op Note (Signed)
11/25/2013  4:44 PM  PATIENT:  Brent Wilkins  73 y.o. male  PRE-OPERATIVE DIAGNOSIS:  Valgus ARTHRITIS OF LEFT ANKLE  POST-OPERATIVE DIAGNOSIS:  Valgus ARTHRITIS OF LEFT ANKLE  Procedure(s): Left total ankle arthroplasty  SURGEON:  Wylene Simmer, MD  ASSISTANT:  Nicki Reaper Flowers, PA-C  ANESTHESIA:   General, regional  EBL:  minimal   TOURNIQUET:   Total Tourniquet Time Documented: Thigh (Left) - 94 minutes Total: Thigh (Left) - 94 minutes   COMPLICATIONS:  None apparent  DISPOSITION:  Extubated, awake and stable to recovery.  DICTATION ID:  [id number]

## 2013-11-25 NOTE — Anesthesia Procedure Notes (Signed)
Procedure Name: Intubation Date/Time: 11/25/2013 2:45 PM Performed by: Williemae Area B Pre-anesthesia Checklist: Patient identified, Emergency Drugs available, Suction available and Patient being monitored Patient Re-evaluated:Patient Re-evaluated prior to inductionOxygen Delivery Method: Circle system utilized Preoxygenation: Pre-oxygenation with 100% oxygen Intubation Type: IV induction Ventilation: Mask ventilation without difficulty and Oral airway inserted - appropriate to patient size Laryngoscope Size: Mac, 3 and 4 (unable to lift epiglottis with either Mac blade; Dr. Orene Desanctis met with success using Sabra Heck 3) Grade View: Grade III Tube type: Oral Tube size: 7.5 mm Number of attempts: 3 Airway Equipment and Method: Stylet Placement Confirmation: ETT inserted through vocal cords under direct vision,  breath sounds checked- equal and bilateral and positive ETCO2 Secured at: 23 (cm at teeth) cm Tube secured with: Tape Dental Injury: Teeth and Oropharynx as per pre-operative assessment

## 2013-11-25 NOTE — Anesthesia Postprocedure Evaluation (Signed)
  Anesthesia Post-op Note  Patient: Brent Wilkins  Procedure(s) Performed: Procedure(s): TOTAL ANKLE ARTHOPLASTY (Left)  Patient Location: PACU  Anesthesia Type:GA combined with regional for post-op pain  Level of Consciousness: awake and alert   Airway and Oxygen Therapy: Patient Spontanous Breathing  Post-op Pain: none  Post-op Assessment: Post-op Vital signs reviewed  Post-op Vital Signs: stable  Last Vitals:  Filed Vitals:   11/25/13 1730  BP: 141/85  Pulse: 57  Temp:   Resp: 15    Complications: No apparent anesthesia complications

## 2013-11-26 MED ORDER — OXYCODONE HCL 5 MG PO TABS: 5.0000 mg | ORAL_TABLET | ORAL | Status: DC | PRN

## 2013-11-26 MED ORDER — CELECOXIB 200 MG PO CAPS: 200.0000 mg | ORAL_CAPSULE | Freq: Every day | ORAL | Status: DC

## 2013-11-26 NOTE — Discharge Instructions (Signed)
Brent Simmer, MD [place]  Please read the following information regarding your care after surgery.  Medications  You only need a prescription for the narcotic pain medicine (ex. oxycodone, Percocet, Norco).  All of the other medicines listed below are available over the counter. X acetominophen (Tylenol) 650 mg every 4-6 hours as you need for minor pain X oxycodone as prescribed for moderate to severe pain   Narcotic pain medicine (ex. oxycodone, Percocet, Vicodin) will cause constipation.  To prevent this problem, take the following medicines while you are taking any pain medicine. X docusate sodium (Colace) 100 mg twice a day X senna (Senokot) 2 tablets twice a day  X To help prevent blood clots, take an aspirin (325 mg) once a day for a month after surgery.  You should also get up every hour while you are awake to move around.    Weight Bearing X Do not bear any weight on the operated leg or foot.  Cast / Splint / Dressing X Keep your splint or cast clean and dry.  Dont put anything (coat hanger, pencil, etc) down inside of it.  If it gets damp, use a hair dryer on the cool setting to dry it.  If it gets soaked, call the office to schedule an appointment for a cast change.  Swelling It is normal for you to have swelling where you had surgery.  To reduce swelling and pain, keep your toes above your nose for at least 3 days after surgery.  It may be necessary to keep your foot or leg elevated for several weeks.  If it hurts, it should be elevated.  Follow Up Call my office at [phone] when you are discharged from the hospital or surgery center to schedule an appointment to be seen two weeks after surgery.  Call my office at [phone] if you develop a fever >101.5 F, nausea, vomiting, bleeding from the surgical site or severe pain.

## 2013-11-26 NOTE — Op Note (Signed)
Brent Wilkins, Brent Wilkins NO.:  [id number]  MEDICAL RECORD NO.:  [id number]  LOCATION:  [id number]                        FACILITY:  [place]  PHYSICIAN:  Wylene Simmer, MD        DATE OF BIRTH:  [birth year]  DATE OF PROCEDURE:  11/25/2013 DATE OF DISCHARGE:                              OPERATIVE REPORT   PREOPERATIVE DIAGNOSIS:  Valgus arthritis of the left ankle.  POSTOPERATIVE DIAGNOSIS:  Valgus arthritis of the left ankle.  PROCEDURE:  Left total ankle arthroplasty.  SURGEON:  Wylene Simmer, MD  ASSISTANT:  Nicki Reaper Flowers, PA-C.  ANESTHESIA:  General, regional.  ESTIMATED BLOOD LOSS:  Minimal.  TOURNIQUET TIME:  94 minutes at 300 mmHg.  COMPLICATIONS:  None apparent.  DISPOSITION:  Extubated, awake, and stable to recovery.  INDICATIONS FOR PROCEDURE:  The patient is a 73 year old male who has a long history of bilateral ankle arthritis, the left is worse than the right.  He presents now for operative treatment of this condition having failed all nonoperative treatment.  He understands the risks and benefits, the alternative treatment options and elects surgical treatment.  He specifically understands risks of bleeding, infection, nerve damage, blood clots, need for additional surgery, amputation, and death.  PROCEDURE IN DETAIL:  After preoperative consent was obtained and the correct operative site was identified, the patient was brought to the operating room and placed supine on the operating table.  General anesthesia was induced.  Preoperative antibiotics were administered. Surgical time-out was taken.  The left lower extremity was prepped and draped in standard sterile fashion, and the tourniquet around the thigh. The extremity was exsanguinated and the tourniquet was inflated to 300 mmHg.  A longitudinal incision was then made over the anterior ankle. Sharp dissection was carried down through skin and subcutaneous tissue. The extensor retinaculum was  incised over the extensor hallucis longus tendon.  It was released proximally and distally.  The neurovascular bundle was identified and it was mobilized laterally off of the anterior aspect of the tibia.  The joint capsule was then elevated from the anterior aspect of the joint with a scalpel.  Overhanging osteophytes anteriorly were resected.  Stab incision was then made over the tibial tubercle and the 3.2 mm guide pin was inserted parallel to a quarter- inch osteotome placed in the medial gutter.  The external alignment guide was then applied, and rotation was set in line with the T-handle distally.  The AP and lateral alignment was then corrected using the AP and lateral fluoroscopic images.  The medial-lateral translation was then set on the AP radiograph.  The distal resection level was set using the angel wing.  The distal cutting block was then pinned into position. The distal cut was made with the oscillating and reciprocating saws. The distal cutting guide was removed and the bone was removed from the distal cut.  The spacer block was then used and adequate resection was verified.  The talar cutting guide was then inserted after removing the remaining medial cartilage from the talar dome.  The talus was held in a reduced position while the block was pinned into place.  Lateral radiograph confirmed appropriate position of the  cut.  The talar cut was made with the oscillating saw.  The cutting guide was removed along with the external alignment guide.  The cut surface of the talus was then sized and a size small datum was pinned into place.  The anterior- posterior cutting guide was then appropriately positioned and the cuts were made.  The guide was swapped out for the medial-lateral cutting guide and again the cuts were made.  All the waist bone was removed. The talar window trial was then positioned, was noted to fit appropriately.  It was pinned into place.  The keel hole was  drilled and then broached.  The wound was irrigated copiously.  The size small talar component was impacted into position, was noted to have excellent purchase.  The distal tibia was sized and a large trial was inserted. It was noted to fit appropriately.  AP and lateral radiographs confirmed appropriate position of the trial.  The trial was then drilled and punched.  It was removed and all of the waist bone was collected for later bone grafting.  The size large tibial component was inserted and impacted into position.  AP and lateral radiographs confirmed appropriate position of the tibial implant.  The trial 8 mm poly was inserted and was noted to have appropriate tension at the medial and lateral ankle ligaments.  The wound was once again irrigated copiously. Bone graft was packed into the barrel holes anteriorly.  The trial poly was removed and replaced with a 9 mm polyethylene spacer.  The wound was once again irrigated copiously.  The anterior joint capsule was repaired with simple sutures of 0 Vicryl.  The extensor retinaculum was repaired with simple sutures and running sutures of 0 Vicryl.  Inverted simple sutures of 3-0 Monocryl were used to close the subcutaneous tissue and a running 3-0 nylon was used to close the skin incision.  Guide pin was removed proximally and the proximal knee incision was closed with horizontal mattress suture of 3-0 nylon.  Sterile dressings were applied followed by well-padded cast.  Tourniquet was released at 94 minutes after application of the dressings.  The patient was awakened from anesthesia and transported to the recovery room in stable condition.  FOLLOWUP PLAN:  The patient will be nonweightbearing on the left lower extremity.  He will be admitted for this inpatient only procedure.  He will have physical therapy and occupational therapy consultations tomorrow.  He will start aspirin for DVT prophylaxis.  Scott Flowers, PA-C was present and  scrubbed for the duration of the case.  His assistance was essentially gaining and maintaining exposure, performing the operation, closing dressing the wounds, and applying the cast.     Wylene Simmer, MD     JH/MEDQ  D:  11/25/2013  T:  11/26/2013  Job:  [id number]

## 2013-11-26 NOTE — Care Management (Signed)
CARE MANAGEMENT NOTE 11/26/2013  Patient:  Brent Wilkins, Brent Wilkins   Account Number:  [id number]  Date Initiated:  11/26/2013  Documentation initiated by:  Ricki Miller  Subjective/Objective Assessment:   73 yr old male s/p left ankle arthroplasty.     Action/Plan:   Physical and occupational therapy state patient has no Home Health follow needed at this time.  No DME needs.   Anticipated DC Date:  11/26/2013   Anticipated DC Plan:  [place]  CM consult      Choice offered to / List presented to:             Status of service:  Completed, signed off Medicare Important Message given?   (If response is "NO", the following Medicare IM given date fields will be blank) Date Medicare IM given:   Date Additional Medicare IM given:    Discharge Disposition:  HOME/SELF CARE  Per UR Regulation:

## 2013-11-26 NOTE — Progress Notes (Signed)
Utilization review completed.  

## 2013-11-26 NOTE — Progress Notes (Signed)
Occupational Therapy Evaluation and Discharge Patient Details Name: Brent Wilkins MRN: [id number] DOB: May 22, 1941 Today's Date: 11/26/2013    History of Present Illness Pt is 73 yo Male s/p L total ankle arthroplasty for arthritis.    Clinical Impression   PTA pt lived at home with his wife and was independent in ADLs and functional mobility. Education and training provided for LB ADLs using compensatory techniques. Educated pt on maintaining NWB status on LLE during ADLs and functional mobility. Education provided for safe toilet/shower transfers at home. No further acute OT needs.     Follow Up Recommendations  No OT follow up;Supervision/Assistance - 24 hour    Equipment Recommendations  None recommended by OT       Precautions / Restrictions Precautions Precautions: Fall Restrictions Weight Bearing Restrictions: Yes LLE Weight Bearing: Non weight bearing      Mobility Bed Mobility Overal bed mobility: Modified Independent                Transfers Overall transfer level: Needs assistance Equipment used: Rolling walker (2 wheeled) Transfers: Sit to/from Stand Sit to Stand: Min assist         General transfer comment: assist for stabilizing walker, no physical assist needed to power up          ADL Overall ADL's : Needs assistance/impaired Eating/Feeding: Independent;Sitting   Grooming: Oral care;Wash/dry hands;Brushing hair;Independent;Sitting   Upper Body Bathing: Set up;Sitting   Lower Body Bathing: Sit to/from stand;Min guard   Upper Body Dressing : Set up;Sitting   Lower Body Dressing: Sit to/from stand;Min guard   Toilet Transfer: Minimal assistance;Ambulation;Comfort height toilet;Grab bars;RW (assist to stabilize RW, no physical assist to power up)   Toileting- Clothing Manipulation and Hygiene: Min guard;Sit to/from stand   Tub/ Shower Transfer: Walk-in shower;Minimal assistance;Rolling walker;Ambulation (assist to stabilize RW)    Functional mobility during ADLs: Min guard;Rolling walker General ADL Comments: VC's for NWB, pt appears to set foot down on floor during ambulation and transfers but is not putting weight through LLE.      Vision  Per pt, no change from baseline.                          Pertinent Vitals/Pain No c/o pain, reports RN administered pain medication around 6:30am.      Hand Dominance Right   Extremity/Trunk Assessment Upper Extremity Assessment Upper Extremity Assessment: Overall WFL for tasks assessed   Lower Extremity Assessment Lower Extremity Assessment: Defer to PT evaluation   Cervical / Trunk Assessment Cervical / Trunk Assessment: Normal   Communication Communication Communication: No difficulties   Cognition Arousal/Alertness: Awake/alert Behavior During Therapy: WFL for tasks assessed/performed Overall Cognitive Status: Within Functional Limits for tasks assessed                                Home Living Family/patient expects to be discharged to:: Private residence Living Arrangements: Spouse/significant other Available Help at Discharge: Family;Available 24 hours/day Type of Home: House Home Access: Stairs to enter [employer] of Steps: 3   Home Layout: One level     Bathroom Shower/Tub: Occupational psychologist: Standard     Home Equipment: Environmental consultant - 2 wheels;Walker - standard;Bedside commode          Prior Functioning/Environment Level of Independence: Independent  End of Session Equipment Utilized During Treatment: Gait belt;Rolling walker  Activity Tolerance: Patient tolerated treatment well Patient left: in chair;with call bell/phone within reach   Time: 0829-0902 OT Time Calculation (min): 33 min Charges:  OT General Charges $OT Visit: 1 Procedure OT Evaluation $Initial OT Evaluation Tier I: 1 Procedure OT Treatments $Self Care/Home Management  : 23-37 mins  Juluis Rainier [phone] 11/26/2013, 9:27 AM

## 2013-11-26 NOTE — Discharge Summary (Signed)
Physician Discharge Summary  Patient ID: Brent Wilkins MRN: [id number] DOB/AGE: [birth year] 75 y.o.  Admit date: 11/25/2013 Discharge date: 11/26/2013  Admission Diagnoses: Left ankle arthritis   Discharge Diagnoses: same Active Problems:   Arthritis of left ankle   Discharged Condition: good  Hospital Course: Pt has been diagnosed with left ankle arthritis requiring surgical intervention for proper treatment.  All risks and benefits of surgery were discussed with pt and he elected to proceed with procedure.  On 11/25/2013 pt was brought to [facility] were left total ankle arthroplasty was performed by Brent Wilkins with no complications. Pt was recovered in PACU and then transferred to the floor for further post operative care. During his stay, pt underwent PT/OT evaluation for home therapy as well as Lovenox 40mg  subq for blood clot prevention.  On 11/26/2013, all vital signs were stable. Pt was appropriate for discharge. Prognosis for the pt is good and he will f/u with Brent Wilkins in 3 weeks.  Consults: None  Significant Diagnostic Studies: none  Treatments: surgery: as stated above  Discharge Exam: Blood pressure 126/76, pulse 71, temperature 98.7 F (37.1 C), temperature source Oral, resp. rate 18, height 5\' 11"  (1.803 m), weight 90.266 kg (199 lb), SpO2 96.00%. [name] in NAD, A/Ox3, appears stated age. EOMI, mood and affect normal, respirations unlabored. On physical exam cast is C/D/I, in proepr placement and in good repair.  Toes mobile, well perfused with decreased sensation to light touch noted.   Disposition: 01-Home or Self Care  Discharge Orders   Future Orders Complete By Expires   Call Wilkins / Call 911  As directed    Constipation Prevention  As directed    Diet - low sodium heart healthy  As directed    Driving restrictions  As directed    Increase activity slowly as tolerated  As directed    Lifting restrictions  As directed        Medication List     STOP taking these medications       aspirin 81 MG tablet     diclofenac sodium 1 % Gel  Commonly known as:  VOLTAREN     EXCEDRIN PO     FISH OIL CONCENTRATE PO     fluticasone 50 MCG/ACT nasal spray  Commonly known as:  FLONASE     gabapentin 300 MG capsule  Commonly known as:  NEURONTIN      TAKE these medications       amLODipine 10 MG tablet  Commonly known as:  NORVASC  Take 10 mg by mouth daily.     celecoxib 200 MG capsule  Commonly known as:  CELEBREX  Take 1 capsule (200 mg total) by mouth daily.     dutasteride 0.5 MG capsule  Commonly known as:  AVODART  Take 0.5 mg by mouth daily.     levothyroxine 25 MCG tablet  Commonly known as:  SYNTHROID, LEVOTHROID  Take 25 mcg by mouth daily before breakfast.     multivitamin with minerals Tabs tablet  Take 1 tablet by mouth daily.     omeprazole 20 MG capsule  Commonly known as:  PRILOSEC  Take 20 mg by mouth at bedtime.     oxyCODONE 5 MG immediate release tablet  Commonly known as:  Oxy IR/ROXICODONE  Take 1-2 tablets (5-10 mg total) by mouth every 4 (four) hours as needed for severe pain or breakthrough pain.     tamsulosin 0.4 MG Caps  capsule  Commonly known as:  FLOMAX  Take 0.4 mg by mouth daily after breakfast.     vitamin B-12 1000 MCG tablet  Commonly known as:  CYANOCOBALAMIN  Take 1,000 mcg by mouth daily.           Follow-up Information   Follow up with Brent Wilkins. Schedule an appointment as soon as possible for a visit in 3 weeks. (For suture removal)    Specialty:  Orthopedic Surgery   Contact information:   [address] [place] [address] [phone]       Brent Wilkins 11/26/2013, 7:30 AM

## 2013-11-26 NOTE — Progress Notes (Signed)
Subjective: 1 Day Post-Op Procedure(s) (LRB): TOTAL ANKLE ARTHOPLASTY (Left) Patient doing very well this AM, pain well controlled with oral medication, pt feels hungry and reports he was able to sleep well last night. 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   Objective: Vital signs in last 24 hours: Temp:  [97.3 F (36.3 C)-98.7 F (37.1 C)] 98.7 F (37.1 C) (04/24 0428) Pulse Rate:  [53-78] 71 (04/24 0428) Resp:  [10-20] 18 (04/24 0428) BP: (125-160)/(60-87) 126/76 mmHg (04/24 0428) SpO2:  [96 %-100 %] 96 % (04/24 0428) Weight:  [90.266 kg (199 lb)] 90.266 kg (199 lb) (04/23 1255)  Intake/Output from previous day: 04/23 0701 - 04/24 0700 In: 2940 [P.O.:240; I.V.:2700] Out: 520 [Urine:495; Blood:25] Intake/Output this shift:    No results found for this basename: HGB,  in the last 72 hours No results found for this basename: WBC, RBC, HCT, PLT,  in the last 72 hours No results found for this basename: NA, K, CL, CO2, BUN, CREATININE, GLUCOSE, CALCIUM,  in the last 72 hours No results found for this basename: LABPT, INR,  in the last 72 hours  [name] in NAD, A/Ox3, appears stated age. EOMI, mood and affect normal, respirations unlabored. On physical exam cast is C/D/I, in proepr placement and in good repair.  Toes mobile, well perfused with decreased sensation to light touch noted.   Assessment/Plan: 1 Day Post-Op Procedure(s) (LRB): TOTAL ANKLE ARTHOPLASTY (Left) Discharge home after PT/OT eval Begin Aspirin 325mg  per day for 6 weeks No weight bearing on left lower extremity F/u in 3 weeks with Dr. Clare Gandy S Flowers 11/26/2013, 7:25 AM

## 2013-11-26 NOTE — Care Management Note (Signed)
CARE MANAGEMENT NOTE 11/26/2013  Patient:  Brent Wilkins, Brent Wilkins   Account Number:  [id number]  Date Initiated:  11/26/2013  Documentation initiated by:  Ricki Miller  Subjective/Objective Assessment:   73 yr old male s/p left ankle arthroplasty.     Action/Plan:   Patient has no Home health needs at this time.   Anticipated DC Date:  11/26/2013   Anticipated DC Plan:  [place]  CM consult      Choice offered to / List presented to:             Status of service:  In process, will continue to follow

## 2013-11-26 NOTE — Progress Notes (Signed)
Patient provided with discharge instructions and follow up information. He is going home at this time with wife. Educated on cast care and importance of keeping it clean, dry, and intact.

## 2013-11-26 NOTE — Evaluation (Signed)
Physical Therapy Evaluation Patient Details Name: Brent Wilkins MRN: [id number] DOB: Mar 15, 1941 Today's Date: 11/26/2013   History of Present Illness  Pt is 73 yo Male s/p L total ankle arthroplasty for arthritis.   Clinical Impression  Pt moving well and very motivated.  Pt has all needed DME and support from family at home.  No further PT needs at this time.  Will sign off.      Follow Up Recommendations No PT follow up;Supervision/Assistance - 24 hour    Equipment Recommendations  None recommended by PT    Recommendations for Other Services       Precautions / Restrictions Precautions Precautions: Fall Restrictions Weight Bearing Restrictions: Yes LLE Weight Bearing: Non weight bearing      Mobility  Bed Mobility Overal bed mobility: Modified Independent                Transfers Overall transfer level: Needs assistance Equipment used: Rolling walker (2 wheeled) Transfers: Sit to/from Stand Sit to Stand: Supervision         General transfer comment: cues for UE use, but pt able to complete without physical A.    Ambulation/Gait Ambulation/Gait assistance: Supervision Ambulation Distance (Feet): 60 Feet Assistive device: Rolling walker (2 wheeled) Gait Pattern/deviations: Step-to pattern     General Gait Details: pt states he has been practicing [place] prior to surgery.  pt does tend to let L foot touch floor and seems more TDWBing.  cues for NWBing.    Stairs Stairs: Yes Stairs assistance: Min guard Stair Management: One rail Left;Step to pattern;Forwards;With crutches Number of Stairs: 4 General stair comments: pt utilized one crutch and one rail and demos good safety and technique.  cueing for positioning of L LE to avoid hitting step.    Wheelchair Mobility    Modified Rankin (Stroke Patients Only)       Balance                                             Pertinent Vitals/Pain "Hardly any pain."      Home Living  Family/patient expects to be discharged to:: Private residence Living Arrangements: Spouse/significant other Available Help at Discharge: Family;Available 24 hours/day Type of Home: House Home Access: Stairs to enter   [employer] of Steps: 3 Home Layout: One level Home Equipment: Walker - 2 wheels;Walker - standard;Bedside commode      Prior Function Level of Independence: Independent               Hand Dominance   Dominant Hand: Right    Extremity/Trunk Assessment   Upper Extremity Assessment: Defer to OT evaluation           Lower Extremity Assessment: Overall WFL for tasks assessed (except where casted.  )      Cervical / Trunk Assessment: Normal  Communication   Communication: No difficulties  Cognition Arousal/Alertness: Awake/alert Behavior During Therapy: WFL for tasks assessed/performed Overall Cognitive Status: Within Functional Limits for tasks assessed                      General Comments      Exercises        Assessment/Plan    PT Assessment Patent does not need any further PT services  PT Diagnosis     PT Problem List    PT Treatment Interventions  PT Goals (Current goals can be found in the Care Plan section) Acute Rehab PT Goals Patient Stated Goal: Home today PT Goal Formulation: No goals set, d/c therapy    Frequency     Barriers to discharge        Co-evaluation               End of Session Equipment Utilized During Treatment: Gait belt Activity Tolerance: Patient tolerated treatment well Patient left: in chair;with call bell/phone within reach Nurse Communication: Mobility status         Time: 7948-0165 PT Time Calculation (min): 25 min   Charges:   PT Evaluation $Initial PT Evaluation Tier I: 1 Procedure PT Treatments $Gait Training: 8-22 mins   PT G CodesCatarina Hartshorn, PT [phone] 11/26/2013, 11:58 AM

## 2013-11-30 ENCOUNTER — Encounter (HOSPITAL_COMMUNITY): Payer: Self-pay | Admitting: Orthopedic Surgery

## 2014-05-16 ENCOUNTER — Encounter: Payer: Self-pay | Admitting: Internal Medicine

## 2014-05-16 ENCOUNTER — Ambulatory Visit (INDEPENDENT_AMBULATORY_CARE_PROVIDER_SITE_OTHER): Payer: Medicare Other | Admitting: Internal Medicine

## 2014-05-16 VITALS — BP 126/78 | HR 88 | Ht 70.0 in | Wt 206.0 lb

## 2014-05-16 DIAGNOSIS — R251 Tremor, unspecified: Secondary | ICD-10-CM

## 2014-05-16 DIAGNOSIS — R42 Dizziness and giddiness: Secondary | ICD-10-CM

## 2014-05-16 DIAGNOSIS — I10 Essential (primary) hypertension: Secondary | ICD-10-CM

## 2014-05-16 MED ORDER — AMLODIPINE BESYLATE 5 MG PO TABS: ORAL_TABLET | ORAL | Status: DC

## 2014-05-16 MED ORDER — PROPRANOLOL HCL 10 MG PO TABS: ORAL_TABLET | ORAL | Status: DC

## 2014-05-16 NOTE — Progress Notes (Signed)
Patient is a 73 yo who was previously followed by Corky Downs  He has a history of syncope with positive tilt table in 2003,  He has been treated wi th sertraline and b blocker.  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   He was last seen by Corky Downs in APril 2014  He cut back on propranolol to 20 bid  Added CoQ 10 for cramps.   Normal lexiscan myoview in 2012 Normal echo in 2012.  Mild MR.  Mild TR.    Patient's syncopal spells only happened in church  Gets about 1 min warning Warm, sweats, yawn  Out.  Treated for years   Knocking him down.   Stopped those meds.   I saw him back in October 2014  Doing well at that time    Since seen he has had no syncopal spells.  Does get dizzy at times if standing  Admits he prob doesn't drink enough fluids.   No palpitations  Is having more prob with tremor  (has had since young)  Allergies  Allergen Reactions  . Statins     Hard to walk, severe leg cramps  . Tetanus Toxoids     Swells up  . Penicillins Swelling and Rash  . Sulfa Antibiotics Swelling and Rash    Current Outpatient Prescriptions  Medication Sig Dispense Refill  . amLODipine (NORVASC) 10 MG tablet Take 10 mg by mouth daily.      . celecoxib (CELEBREX) 200 MG capsule Take 1 capsule (200 mg total) by mouth daily.  45 capsule  0  . dutasteride (AVODART) 0.5 MG capsule Take 0.5 mg by mouth daily.      Marland Kitchen levothyroxine (SYNTHROID, LEVOTHROID) 25 MCG tablet Take 25 mcg by mouth daily before breakfast.      . Multiple Vitamin (MULITIVITAMIN WITH MINERALS) TABS Take 1 tablet by mouth daily.      Marland Kitchen omeprazole (PRILOSEC) 20 MG capsule Take 20 mg by mouth at bedtime.      Marland Kitchen oxyCODONE (OXY IR/ROXICODONE) 5 MG immediate release tablet Take 1-2 tablets (5-10 mg total) by mouth every 4 (four) hours as needed for severe pain or breakthrough pain.  60 tablet  0  . Tamsulosin HCl (FLOMAX) 0.4 MG CAPS Take 0.4 mg by mouth daily after breakfast.       . vitamin B-12 (CYANOCOBALAMIN)  1000 MCG tablet Take 1,000 mcg by mouth daily.      . fluticasone (FLONASE) 50 MCG/ACT nasal spray Place 1 spray into both nostrils daily.       Marland Kitchen gabapentin (NEURONTIN) 300 MG capsule Take 300 mg by mouth 2 (two) times daily.       Marland Kitchen ibuprofen (ADVIL,MOTRIN) 600 MG tablet Take 600 mg by mouth daily.        No current facility-administered medications for this visit.    Past Medical History  Diagnosis Date  . HTN (hypertension)   . Seasonal allergies   . Prostate cancer     Dr. Rosana Hoes  . Neuropathy     Bilateral ankles  . Hypothyroidism   . Dysphagia   . Syncope  dx. 8 yrs ago  . GERD (gastroesophageal reflux disease)     Past Surgical History  Procedure Laterality Date  . Tonsillectomy    . Appendectomy    . Colonoscopy  07/23/10    Dr. Vivi Ferns rectum, scattered pancolonic diverticula  . Colonoscopy  08/10/1999    internal hemorrhoids,inflammatory polyp  . Cholecystectomy    .  Esophagogastroduodenoscopy  04/12/08    Prominent Schatzki's ring, erosive reflux esophagitis, multiple antral erosions, small hiatal hernia, reactive gastropathy, status post dilation with 86F  . Esophagogastroduodenoscopy  10/28/2011    Procedure: ESOPHAGOGASTRODUODENOSCOPY (EGD);  Surgeon: Daneil Dolin, MD;  [facility];  Service: Endoscopy;  Laterality: N/A;  1:30  . Savory dilation  10/28/2011    Procedure: SAVORY DILATION;  Surgeon: Daneil Dolin, MD;  Location: [facility];  Service: Endoscopy;  Laterality: N/A;  Venia Minks dilation  10/28/2011    Procedure: Venia Minks DILATION;  Surgeon: Daneil Dolin, MD;  Location: [facility];  Service: Endoscopy;  Laterality: N/A;  . Total ankle arthroplasty Left 11/25/2013    Procedure: TOTAL ANKLE ARTHOPLASTY;  Surgeon: Wylene Simmer, MD;  Location: [facility];  Service: Orthopedics;  Laterality: Left;    Family History  Problem Relation Age of Onset  . Prostate cancer Father   . Prostate cancer Son 54  . Prostate cancer Brother     History    Social History  . Marital Status: Married    Spouse Name: N/A    Number of Children: 2  . Years of Education: N/A   Occupational History  . retired; Nurse, learning disability    Social History Main Topics  . Smoking status: Never Smoker   . Smokeless tobacco: Never Used  . Alcohol Use: Yes     Comment: 2 beers a week  . Drug Use: No  . Sexual Activity: Not on file   Other Topics Concern  . Not on file   Social History Narrative  . No narrative on file    Review of Systems:  All systems reviewed.  They are negative to the above problem except as previously stated.  Vital Signs: BP 126/78  Pulse 88  Ht 5\' 10"  (1.778 m)  Wt 206 lb (93.441 kg)  BMI 29.56 kg/m2  Physical Exam Patient is in NAD HEENT:  Normocephalic, atraumatic. EOMI, PERRLA.  Neck: JVP is normal.  No bruits.  Lungs: clear to auscultation. No rales no wheezes.  Heart: Regular rate and rhythm. Normal S1, S2. No S3.   No significant murmurs. PMI not displaced.  Abdomen:  Supple, nontender. Normal bowel sounds. No masses. No hepatomegaly.  Extremities:   Good distal pulses throughout. No lower extremity edema.  Musculoskeletal :moving all extremities.  Neuro:   alert and oriented x3.  CN II-XII grossly intact.   Assessment and Plan:   1.  Syncope.  Patient had syncope in past  Last several years ago  Only in church.  Intermitt dizziness  Drink fluids  Stay active  Can try splitting amlodipine to 5 bid  Hold PM if having a bad day      2.  HL  Get labs from [facility]    3.  HTN  Adequate conrol  4.  Tremor  Will try low dose inderal    F/u 1 year.

## 2014-05-16 NOTE — Patient Instructions (Addendum)
Your physician wants you to follow-up in: 1 year with Dr. Theressa Stamps will receive a reminder letter in the mail two months in advance. If you don't receive a letter, please call our office to schedule the follow-up appointment.  Your physician has recommended you make the following change in your medication:   AMLODIPINE 5 MG TWICE DAILY  TAKE INDERAL 10 MG TWICE DAILY AS NEEDED  PLEASE DRINK PLENTY OF WATER TO STAY HYDRATED  We will request labs from Dr. Gerarda Fraction  Thank you for choosing [facility]!!

## 2014-06-21 ENCOUNTER — Telehealth: Payer: Self-pay | Admitting: *Deleted

## 2014-06-21 MED ORDER — AMLODIPINE BESYLATE 5 MG PO TABS: ORAL_TABLET | ORAL | Status: DC

## 2014-06-21 NOTE — Telephone Encounter (Signed)
Pt states that amlodipine dose and he is having a hard time cutting pills can we call him in Rx for 5mg  twice a day. [facility]

## 2014-06-21 NOTE — Telephone Encounter (Signed)
Left message for pt. Refilled amlodipine to [facility] 5 mg BID

## 2014-07-06 ENCOUNTER — Encounter (HOSPITAL_COMMUNITY): Payer: Self-pay

## 2014-07-06 ENCOUNTER — Encounter (HOSPITAL_COMMUNITY): Payer: Self-pay | Admitting: Pharmacy Technician

## 2014-07-06 ENCOUNTER — Encounter (HOSPITAL_COMMUNITY)
Admission: RE | Admit: 2014-07-06 | Discharge: 2014-07-06 | Disposition: A | Discharge status: Home / Self Care | Payer: Medicare Other | Source: Ambulatory Visit | Attending: Orthopedic Surgery | Admitting: Orthopedic Surgery

## 2014-07-06 DIAGNOSIS — Z01812 Encounter for preprocedural laboratory examination: Secondary | ICD-10-CM | POA: Diagnosis present

## 2014-07-06 HISTORY — DX: Meningitis, unspecified: G03.9

## 2014-07-06 HISTORY — DX: Unspecified osteoarthritis, unspecified site: M19.90

## 2014-07-06 HISTORY — DX: Personal history of urinary calculi: Z87.442

## 2014-07-06 HISTORY — DX: Nocturia: R35.1

## 2014-07-06 LAB — TYPE AND SCREEN
ABO/RH(D): O POS
Antibody Screen: NEGATIVE

## 2014-07-06 LAB — BASIC METABOLIC PANEL
ANION GAP: 12 (ref 5–15)
BUN: 19 mg/dL (ref 6–23)
CALCIUM: 9.3 mg/dL (ref 8.4–10.5)
CHLORIDE: 105 meq/L (ref 96–112)
CO2: 26 mEq/L (ref 19–32)
CREATININE: 1.06 mg/dL (ref 0.50–1.35)
GFR calc Af Amer: 77 mL/min — ABNORMAL LOW (ref >90)
GFR calc non Af Amer: 67 mL/min — ABNORMAL LOW (ref >90)
Glucose, Bld: 90 mg/dL (ref 70–99)
Potassium: 4.3 mEq/L (ref 3.7–5.3)
Sodium: 143 mEq/L (ref 137–147)

## 2014-07-06 LAB — CBC
HEMATOCRIT: 59.2 % — AB (ref 39.0–52.0)
Hemoglobin: 15 g/dL (ref 13.0–17.0)
MCH: 31.7 pg (ref 26.0–34.0)
MCHC: 25.3 g/dL — ABNORMAL LOW (ref 30.0–36.0)
MCV: 125.2 fL — AB (ref 78.0–100.0)
PLATELETS: 173 10*3/uL (ref 150–400)
RBC: 4.73 MIL/uL (ref 4.22–5.81)
RDW: 23.9 % — AB (ref 11.5–15.5)
WBC: 6.4 10*3/uL (ref 4.0–10.5)

## 2014-07-06 LAB — ABO/RH: ABO/RH(D): O POS

## 2014-07-06 LAB — SURGICAL PCR SCREEN
MRSA, PCR: NEGATIVE
Staphylococcus aureus: POSITIVE — AB

## 2014-07-06 NOTE — Pre-Procedure Instructions (Signed)
Tyjai Matuszak Bergevin  07/06/2014   Your procedure is scheduled on:  Thursday July 14, 2014 at 1:30 PM.  Report to [facility] Admitting at 11:30 AM.  Call this number if you have problems the morning of surgery: [phone]   Remember:   Do not eat food or drink liquids after midnight.   Take these medicines the morning of surgery with A SIP OF WATER:  Finasteride (Proscar) , Flonase nasal spray, Gabapentin (Neurontin), Levothyroxine (Synthroid), Propranolol (Inderal), Tamsulosin (Flomax)   Please stop taking any herbal medications on 07/07/14 (Ex. Fish oil, Ibuprofen,Advil, Motrin, Alleve, Vitamins, etc)   Do not wear jewelry.  Do not wear lotions, powders, or cologne.   Men may shave face and neck.  Do not bring valuables to the hospital.  [facility] is not responsible for any belongings or valuables.               Contacts, dentures or bridgework may not be worn into surgery.  Leave suitcase in the car. After surgery it may be brought to your room.  For patients admitted to the hospital, discharge time is determined by your treatment team.               Patients discharged the day of surgery will not be allowed to drive home.  Name and phone number of your driver:   Special Instructions: Shower using CHG soap the night before and the morning of your surgery   Please read over the following fact sheets that you were given: Pain Booklet, Coughing and Deep Breathing, Blood Transfusion Information, MRSA Information and Surgical Site Infection Prevention

## 2014-07-06 NOTE — Progress Notes (Signed)
Nurse called Mupriocin in to [facility]. Afterwards patient was informed of PCR results and instructed to pick ointment up and as use it as instructed. Patient verbalized understanding.

## 2014-07-06 NOTE — Progress Notes (Signed)
PCP is [facility] and Cardiologist is Dorris Carnes. Stress and echo results in EPIC on 06/04/11. Patient denied having a cardiac cath, sleep study, cardiac or pulmonary issues.

## 2014-07-13 ENCOUNTER — Other Ambulatory Visit: Payer: Self-pay | Admitting: Orthopedic Surgery

## 2014-07-13 MED ORDER — VANCOMYCIN HCL IN DEXTROSE 1-5 GM/200ML-% IV SOLN
1000.0000 mg | INTRAVENOUS | Status: AC
  Administered 2014-07-14: 1000 mg via INTRAVENOUS
  Filled 2014-07-13: qty 200

## 2014-07-13 MED ORDER — CHLORHEXIDINE GLUCONATE 4 % EX LIQD
60.0000 mL | Freq: Once | CUTANEOUS | Status: DC
  Filled 2014-07-13: qty 60

## 2014-07-13 MED ORDER — SODIUM CHLORIDE 0.9 % IV SOLN: INTRAVENOUS | Status: DC

## 2014-07-14 ENCOUNTER — Inpatient Hospital Stay (HOSPITAL_COMMUNITY): Payer: Medicare Other

## 2014-07-14 ENCOUNTER — Inpatient Hospital Stay (HOSPITAL_COMMUNITY): Payer: Medicare Other | Admitting: Certified Registered Nurse Anesthetist

## 2014-07-14 ENCOUNTER — Encounter (HOSPITAL_COMMUNITY): Payer: Self-pay | Admitting: *Deleted

## 2014-07-14 ENCOUNTER — Inpatient Hospital Stay (HOSPITAL_COMMUNITY)
Admission: RE | Admit: 2014-07-14 | Discharge: 2014-07-15 | DRG: 470 | Disposition: A | Discharge status: Home / Self Care | Payer: Medicare Other | Source: Ambulatory Visit | Attending: Orthopedic Surgery | Admitting: Orthopedic Surgery

## 2014-07-14 ENCOUNTER — Encounter (HOSPITAL_COMMUNITY)
Admission: RE | Disposition: A | Discharge status: Home / Self Care | Payer: Self-pay | Source: Ambulatory Visit | Attending: Orthopedic Surgery

## 2014-07-14 DIAGNOSIS — M19179 Post-traumatic osteoarthritis, unspecified ankle and foot: Secondary | ICD-10-CM | POA: Diagnosis present

## 2014-07-14 DIAGNOSIS — M19171 Post-traumatic osteoarthritis, right ankle and foot: Principal | ICD-10-CM | POA: Diagnosis present

## 2014-07-14 DIAGNOSIS — Z79899 Other long term (current) drug therapy: Secondary | ICD-10-CM | POA: Diagnosis not present

## 2014-07-14 DIAGNOSIS — G629 Polyneuropathy, unspecified: Secondary | ICD-10-CM | POA: Diagnosis present

## 2014-07-14 DIAGNOSIS — K219 Gastro-esophageal reflux disease without esophagitis: Secondary | ICD-10-CM | POA: Diagnosis present

## 2014-07-14 DIAGNOSIS — Z8546 Personal history of malignant neoplasm of prostate: Secondary | ICD-10-CM

## 2014-07-14 DIAGNOSIS — Z7982 Long term (current) use of aspirin: Secondary | ICD-10-CM | POA: Diagnosis not present

## 2014-07-14 DIAGNOSIS — E039 Hypothyroidism, unspecified: Secondary | ICD-10-CM | POA: Diagnosis present

## 2014-07-14 DIAGNOSIS — I10 Essential (primary) hypertension: Secondary | ICD-10-CM | POA: Diagnosis present

## 2014-07-14 DIAGNOSIS — M25571 Pain in right ankle and joints of right foot: Secondary | ICD-10-CM | POA: Diagnosis present

## 2014-07-14 DIAGNOSIS — Z419 Encounter for procedure for purposes other than remedying health state, unspecified: Secondary | ICD-10-CM

## 2014-07-14 HISTORY — PX: TOTAL ANKLE ARTHROPLASTY: SHX811

## 2014-07-14 HISTORY — DX: Post-traumatic osteoarthritis, unspecified ankle and foot: M19.179

## 2014-07-14 SURGERY — ARTHROPLASTY, ANKLE, TOTAL
Anesthesia: General | Site: Ankle | Laterality: Right

## 2014-07-14 MED ORDER — LIDOCAINE HCL (CARDIAC) 20 MG/ML IV SOLN
INTRAVENOUS | Status: DC | PRN
  Administered 2014-07-14: 20 mg via INTRAVENOUS

## 2014-07-14 MED ORDER — SENNA 8.6 MG PO TABS
2.0000 | ORAL_TABLET | Freq: Two times a day (BID) | ORAL | Status: DC
  Administered 2014-07-14 – 2014-07-15 (×2): 17.2 mg via ORAL
  Filled 2014-07-14 (×3): qty 2

## 2014-07-14 MED ORDER — LACTATED RINGERS IV SOLN
INTRAVENOUS | Status: DC | PRN
Start: 2014-07-14 — End: 2014-07-14
  Administered 2014-07-14 (×2): via INTRAVENOUS

## 2014-07-14 MED ORDER — VANCOMYCIN HCL 500 MG IV SOLR
INTRAVENOUS | Status: DC | PRN
  Administered 2014-07-14: 500 mg via TOPICAL

## 2014-07-14 MED ORDER — FENTANYL CITRATE 0.05 MG/ML IJ SOLN
INTRAMUSCULAR | Status: AC
  Filled 2014-07-14: qty 5

## 2014-07-14 MED ORDER — HYDROMORPHONE HCL 2 MG PO TABS
2.0000 mg | ORAL_TABLET | ORAL | Status: DC | PRN
  Administered 2014-07-14 – 2014-07-15 (×2): 2 mg via ORAL
  Administered 2014-07-15: 4 mg via ORAL
  Administered 2014-07-15: 2 mg via ORAL
  Filled 2014-07-14: qty 1
  Filled 2014-07-14: qty 2
  Filled 2014-07-14 (×2): qty 1

## 2014-07-14 MED ORDER — 0.9 % SODIUM CHLORIDE (POUR BTL) OPTIME
TOPICAL | Status: DC | PRN
  Administered 2014-07-14: 1000 mL

## 2014-07-14 MED ORDER — ACETAMINOPHEN 325 MG PO TABS
650.0000 mg | ORAL_TABLET | Freq: Four times a day (QID) | ORAL | Status: DC | PRN
  Administered 2014-07-15: 650 mg via ORAL
  Filled 2014-07-14: qty 2

## 2014-07-14 MED ORDER — AMLODIPINE BESYLATE 10 MG PO TABS
10.0000 mg | ORAL_TABLET | Freq: Every day | ORAL | Status: DC
  Administered 2014-07-14: 10 mg via ORAL
  Filled 2014-07-14 (×2): qty 1

## 2014-07-14 MED ORDER — MIDAZOLAM HCL 2 MG/2ML IJ SOLN
INTRAMUSCULAR | Status: AC
  Filled 2014-07-14: qty 2

## 2014-07-14 MED ORDER — EPHEDRINE SULFATE 50 MG/ML IJ SOLN
INTRAMUSCULAR | Status: DC | PRN
  Administered 2014-07-14: 10 mg via INTRAVENOUS
  Administered 2014-07-14 (×2): 5 mg via INTRAVENOUS
  Administered 2014-07-14 (×2): 10 mg via INTRAVENOUS

## 2014-07-14 MED ORDER — ONDANSETRON HCL 4 MG/2ML IJ SOLN
INTRAMUSCULAR | Status: AC
  Filled 2014-07-14: qty 2

## 2014-07-14 MED ORDER — GABAPENTIN 300 MG PO CAPS
300.0000 mg | ORAL_CAPSULE | Freq: Two times a day (BID) | ORAL | Status: DC
  Administered 2014-07-14 – 2014-07-15 (×2): 300 mg via ORAL
  Filled 2014-07-14 (×3): qty 1

## 2014-07-14 MED ORDER — LEVOTHYROXINE SODIUM 25 MCG PO TABS
25.0000 ug | ORAL_TABLET | Freq: Every day | ORAL | Status: DC
  Administered 2014-07-15: 25 ug via ORAL
  Filled 2014-07-14 (×2): qty 1

## 2014-07-14 MED ORDER — DOCUSATE SODIUM 100 MG PO CAPS
100.0000 mg | ORAL_CAPSULE | Freq: Two times a day (BID) | ORAL | Status: DC
  Administered 2014-07-14 – 2014-07-15 (×2): 100 mg via ORAL
  Filled 2014-07-14 (×3): qty 1

## 2014-07-14 MED ORDER — ONDANSETRON HCL 4 MG/2ML IJ SOLN
INTRAMUSCULAR | Status: DC | PRN
Start: 2014-07-14 — End: 2014-07-14
  Administered 2014-07-14: 4 mg via INTRAVENOUS

## 2014-07-14 MED ORDER — PROPOFOL 10 MG/ML IV BOLUS
INTRAVENOUS | Status: DC | PRN
  Administered 2014-07-14: 150 mg via INTRAVENOUS

## 2014-07-14 MED ORDER — LIDOCAINE HCL (CARDIAC) 20 MG/ML IV SOLN
INTRAVENOUS | Status: AC
  Filled 2014-07-14: qty 5

## 2014-07-14 MED ORDER — PROPRANOLOL HCL 10 MG PO TABS
10.0000 mg | ORAL_TABLET | Freq: Every day | ORAL | Status: DC
  Administered 2014-07-15: 10 mg via ORAL
  Filled 2014-07-14: qty 1

## 2014-07-14 MED ORDER — ROCURONIUM BROMIDE 50 MG/5ML IV SOLN
INTRAVENOUS | Status: AC
  Filled 2014-07-14: qty 1

## 2014-07-14 MED ORDER — ONDANSETRON HCL 4 MG PO TABS: 4.0000 mg | ORAL_TABLET | Freq: Four times a day (QID) | ORAL | Status: DC | PRN

## 2014-07-14 MED ORDER — VANCOMYCIN HCL 500 MG IV SOLR
INTRAVENOUS | Status: AC
  Filled 2014-07-14: qty 500

## 2014-07-14 MED ORDER — EPHEDRINE SULFATE 50 MG/ML IJ SOLN
INTRAMUSCULAR | Status: AC
  Filled 2014-07-14: qty 1

## 2014-07-14 MED ORDER — PHENYLEPHRINE HCL 10 MG/ML IJ SOLN
10.0000 mg | INTRAMUSCULAR | Status: DC | PRN
  Administered 2014-07-14: 20 ug/min via INTRAVENOUS

## 2014-07-14 MED ORDER — SODIUM CHLORIDE 0.9 % IV SOLN
INTRAVENOUS | Status: DC
  Administered 2014-07-14: 21:00:00 via INTRAVENOUS

## 2014-07-14 MED ORDER — PROPOFOL 10 MG/ML IV BOLUS
INTRAVENOUS | Status: AC
  Filled 2014-07-14: qty 20

## 2014-07-14 MED ORDER — HYDROMORPHONE HCL 1 MG/ML IJ SOLN
0.5000 mg | INTRAMUSCULAR | Status: DC | PRN
  Administered 2014-07-15: 1 mg via INTRAVENOUS
  Filled 2014-07-14: qty 1

## 2014-07-14 MED ORDER — STERILE WATER FOR INJECTION IJ SOLN
INTRAMUSCULAR | Status: AC
  Filled 2014-07-14: qty 10

## 2014-07-14 MED ORDER — MIDAZOLAM HCL 5 MG/5ML IJ SOLN
INTRAMUSCULAR | Status: DC | PRN
  Administered 2014-07-14 (×2): 1 mg via INTRAVENOUS

## 2014-07-14 MED ORDER — FENTANYL CITRATE 0.05 MG/ML IJ SOLN
INTRAMUSCULAR | Status: DC | PRN
  Administered 2014-07-14: 25 ug via INTRAVENOUS
  Administered 2014-07-14 (×3): 50 ug via INTRAVENOUS

## 2014-07-14 MED ORDER — LACTATED RINGERS IV SOLN
INTRAVENOUS | Status: DC
  Administered 2014-07-14: 12:00:00 via INTRAVENOUS

## 2014-07-14 MED ORDER — ENOXAPARIN SODIUM 40 MG/0.4ML ~~LOC~~ SOLN
40.0000 mg | SUBCUTANEOUS | Status: DC
  Administered 2014-07-15: 40 mg via SUBCUTANEOUS
  Filled 2014-07-14 (×2): qty 0.4

## 2014-07-14 MED ORDER — ONDANSETRON HCL 4 MG/2ML IJ SOLN: 4.0000 mg | Freq: Four times a day (QID) | INTRAMUSCULAR | Status: DC | PRN

## 2014-07-14 MED ORDER — FENTANYL CITRATE 0.05 MG/ML IJ SOLN: 25.0000 ug | INTRAMUSCULAR | Status: DC | PRN

## 2014-07-14 MED ORDER — ASPIRIN EC 325 MG PO TBEC
325.0000 mg | DELAYED_RELEASE_TABLET | Freq: Every day | ORAL | Status: DC
  Administered 2014-07-14 – 2014-07-15 (×2): 325 mg via ORAL
  Filled 2014-07-14 (×2): qty 1

## 2014-07-14 MED ORDER — FLUTICASONE PROPIONATE 50 MCG/ACT NA SUSP: 1.0000 | Freq: Every day | NASAL | Status: DC | PRN

## 2014-07-14 MED ORDER — PANTOPRAZOLE SODIUM 40 MG PO TBEC
40.0000 mg | DELAYED_RELEASE_TABLET | Freq: Every day | ORAL | Status: DC
  Administered 2014-07-14 – 2014-07-15 (×2): 40 mg via ORAL
  Filled 2014-07-14 (×2): qty 1

## 2014-07-14 MED ORDER — TAMSULOSIN HCL 0.4 MG PO CAPS
0.4000 mg | ORAL_CAPSULE | Freq: Every day | ORAL | Status: DC
  Administered 2014-07-15: 0.4 mg via ORAL
  Filled 2014-07-14 (×2): qty 1

## 2014-07-14 MED ORDER — FINASTERIDE 5 MG PO TABS
5.0000 mg | ORAL_TABLET | Freq: Every day | ORAL | Status: DC
  Administered 2014-07-15: 5 mg via ORAL
  Filled 2014-07-14: qty 1

## 2014-07-14 SURGICAL SUPPLY — 58 items
ANCHOR CORKSCREW 3.5 FIBERWIRE (Anchor) ×3 IMPLANT
BANDAGE ESMARK 6X9 LF (GAUZE/BANDAGES/DRESSINGS) ×1 IMPLANT
BLADE RECIPRO TAPERED (BLADE) ×3 IMPLANT
BLADE SAGITTAL (BLADE) ×3 IMPLANT
BLADE SURG 15 STRL LF DISP TIS (BLADE) ×2 IMPLANT
BLADE SURG 15 STRL SS (BLADE) ×4
BNDG ESMARK 6X9 LF (GAUZE/BANDAGES/DRESSINGS) ×3
CANISTER SUCT 3000ML (MISCELLANEOUS) ×3 IMPLANT
CHLORAPREP W/TINT 26ML (MISCELLANEOUS) ×3 IMPLANT
CORE POLY SLIDING 8MM ANKLE (Ankle) ×3 IMPLANT
COVER SURGICAL LIGHT HANDLE (MISCELLANEOUS) ×3 IMPLANT
CUFF TOURNIQUET SINGLE 34IN LL (TOURNIQUET CUFF) ×3 IMPLANT
CUFF TOURNIQUET SINGLE 44IN (TOURNIQUET CUFF) IMPLANT
DRAPE C-ARM 42X72 X-RAY (DRAPES) ×3 IMPLANT
DRAPE C-ARMOR (DRAPES) ×3 IMPLANT
DRAPE EXTREMITY T 121X128X90 (DRAPE) ×3 IMPLANT
DRAPE IMP U-DRAPE 54X76 (DRAPES) ×3 IMPLANT
DRAPE ORTHO SPLIT 77X108 STRL (DRAPES) ×2
DRAPE SURG ORHT 6 SPLT 77X108 (DRAPES) ×1 IMPLANT
DRAPE U-SHAPE 47X51 STRL (DRAPES) ×3 IMPLANT
DRSG MEPILEX BORDER 4X4 (GAUZE/BANDAGES/DRESSINGS) ×3 IMPLANT
DRSG MEPITEL 4X7.2 (GAUZE/BANDAGES/DRESSINGS) ×3 IMPLANT
DRSG PAD ABDOMINAL 8X10 ST (GAUZE/BANDAGES/DRESSINGS) ×3 IMPLANT
ELECT REM PT RETURN 9FT ADLT (ELECTROSURGICAL) ×3
ELECTRODE REM PT RTRN 9FT ADLT (ELECTROSURGICAL) ×1 IMPLANT
EVACUATOR 1/8 PVC DRAIN (DRAIN) ×3 IMPLANT
GAUZE SPONGE 4X4 12PLY STRL (GAUZE/BANDAGES/DRESSINGS) ×3 IMPLANT
GLOVE BIO SURGEON STRL SZ7 (GLOVE) ×6 IMPLANT
GLOVE BIO SURGEON STRL SZ8 (GLOVE) ×3 IMPLANT
GLOVE BIOGEL PI IND STRL 7.5 (GLOVE) ×1 IMPLANT
GLOVE BIOGEL PI IND STRL 8 (GLOVE) ×1 IMPLANT
GLOVE BIOGEL PI INDICATOR 7.5 (GLOVE) ×2
GLOVE BIOGEL PI INDICATOR 8 (GLOVE) ×2
GOWN STRL REUS W/ TWL LRG LVL3 (GOWN DISPOSABLE) ×2 IMPLANT
GOWN STRL REUS W/ TWL XL LVL3 (GOWN DISPOSABLE) ×1 IMPLANT
GOWN STRL REUS W/TWL LRG LVL3 (GOWN DISPOSABLE) ×4
GOWN STRL REUS W/TWL XL LVL3 (GOWN DISPOSABLE) ×2
IMPLANT TALAR STAR SZ S RT (Orthopedic Implant) ×3 IMPLANT
IMPLANT TIBIAL STAR SZ L (Orthopedic Implant) ×3 IMPLANT
KIT BASIN OR (CUSTOM PROCEDURE TRAY) ×3 IMPLANT
KIT ROOM TURNOVER OR (KITS) ×3 IMPLANT
NS IRRIG 1000ML POUR BTL (IV SOLUTION) ×3 IMPLANT
PACK TOTAL JOINT (CUSTOM PROCEDURE TRAY) ×3 IMPLANT
PACK UNIVERSAL I (CUSTOM PROCEDURE TRAY) ×3 IMPLANT
PAD ARMBOARD 7.5X6 YLW CONV (MISCELLANEOUS) ×6 IMPLANT
PAD CAST 4YDX4 CTTN HI CHSV (CAST SUPPLIES) ×1 IMPLANT
PADDING CAST COTTON 4X4 STRL (CAST SUPPLIES) ×2
PADDING CAST COTTON 6X4 STRL (CAST SUPPLIES) ×3 IMPLANT
SUCTION FRAZIER TIP 10 FR DISP (SUCTIONS) ×3 IMPLANT
SURGILUBE 3G PEEL PACK STRL (MISCELLANEOUS) ×3 IMPLANT
SUT ETHILON 3 0 PS 1 (SUTURE) ×3 IMPLANT
SUT MNCRL AB 3-0 PS2 18 (SUTURE) ×6 IMPLANT
SUT PROLENE 3 0 PS 2 (SUTURE) ×3 IMPLANT
SUT VIC AB 0 CT1 27 (SUTURE) ×4
SUT VIC AB 0 CT1 27XBRD ANBCTR (SUTURE) ×2 IMPLANT
TOWEL OR 17X24 6PK STRL BLUE (TOWEL DISPOSABLE) ×3 IMPLANT
TOWEL OR 17X26 10 PK STRL BLUE (TOWEL DISPOSABLE) ×3 IMPLANT
WATER STERILE IRR 1000ML POUR (IV SOLUTION) ×3 IMPLANT

## 2014-07-14 NOTE — Anesthesia Preprocedure Evaluation (Addendum)
Anesthesia Evaluation  Patient identified by MRN, date of birth, ID band Patient awake    Reviewed: Allergy & Precautions, H&P , NPO status , Patient's Chart, lab work & pertinent test results  Airway Mallampati: II  TM Distance: >3 FB Neck ROM: Full    Dental  (+) Dental Advisory Given, Teeth Intact   Pulmonary          Cardiovascular hypertension, Pt. on medications and Pt. on home beta blockers     Neuro/Psych    GI/Hepatic GERD-  Medicated,  Endo/Other  Hypothyroidism   Renal/GU      Musculoskeletal  (+) Arthritis -,   Abdominal   Peds  Hematology   Anesthesia Other Findings   Reproductive/Obstetrics                          Anesthesia Physical Anesthesia Plan  ASA: II  Anesthesia Plan: General   Post-op Pain Management:    Induction: Intravenous  Airway Management Planned: LMA  Additional Equipment:   Intra-op Plan:   Post-operative Plan: Extubation in OR  Informed Consent: I have reviewed the patients History and Physical, chart, labs and discussed the procedure including the risks, benefits and alternatives for the proposed anesthesia with the patient or authorized representative who has indicated his/her understanding and acceptance.   Dental advisory given  Plan Discussed with: CRNA and Surgeon  Anesthesia Plan Comments:       Anesthesia Quick Evaluation

## 2014-07-14 NOTE — Brief Op Note (Signed)
07/14/2014  5:34 PM  PATIENT:  Brent Wilkins  73 y.o. male  PRE-OPERATIVE DIAGNOSIS:  Right ankle post traumatic arthritis  POST-OPERATIVE DIAGNOSIS:   1.  Right ankle post traumatic arthritis      2.  Right ankle chronic lateral ligament instability  Procedure(s): 1.  Right total ankle arthroplasty (Star:  Large tibia, Small talus, 8 mm poly)   2.  Right ankle lateral ligament reconstruction  SURGEON:  Wylene Simmer, MD  ASSISTANT: n/a  ANESTHESIA:   General, regional  EBL:  minimal   TOURNIQUET:   Total Tourniquet Time Documented: Thigh (Right) - 115 minutes Total: Thigh (Right) - 938 minutes   COMPLICATIONS:  None apparent  DISPOSITION:  Extubated, awake and stable to recovery.  DICTATION ID:  [id number]

## 2014-07-14 NOTE — Anesthesia Postprocedure Evaluation (Signed)
  Anesthesia Post-op Note  Patient: Brent Wilkins  Procedure(s) Performed: Procedure(s): RIGHT TOTAL ANKLE ARTHOPLASTY (Right)  Patient Location: PACU  Anesthesia Type:General and block  Level of Consciousness: awake and alert   Airway and Oxygen Therapy: Patient Spontanous Breathing  Post-op Pain: none  Post-op Assessment: Post-op Vital signs reviewed, Patient's Cardiovascular Status Stable and Respiratory Function Stable  Post-op Vital Signs: Reviewed  Filed Vitals:   07/14/14 1730  BP: 104/51  Pulse: 76  Temp:   Resp: 11    Complications: No apparent anesthesia complications

## 2014-07-14 NOTE — H&P (Signed)
Brent Wilkins is an 73 y.o. male.   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  He has failed non-operative treatment of this painful, limiting condition and presents now for right total ankle replacement.  He is s/p L TAR with a good outcome to date.  Past Medical History  Diagnosis Date  . HTN (hypertension)   . Seasonal allergies   . Prostate cancer     Dr. Rosana Hoes  . Neuropathy     Bilateral ankles  . Hypothyroidism   . Dysphagia   . Syncope  dx. 8 yrs ago  . GERD (gastroesophageal reflux disease)   . History of kidney stones   . Arthritis   . Frequent urination at night   . Meningitis spinal     as a child    Past Surgical History  Procedure Laterality Date  . Tonsillectomy    . Appendectomy    . Colonoscopy  07/23/10    Dr. Vivi Ferns rectum, scattered pancolonic diverticula  . Colonoscopy  08/10/1999    internal hemorrhoids,inflammatory polyp  . Cholecystectomy    . Esophagogastroduodenoscopy  04/12/08    Prominent Schatzki's ring, erosive reflux esophagitis, multiple antral erosions, small hiatal hernia, reactive gastropathy, status post dilation with 44F  . Esophagogastroduodenoscopy  10/28/2011    Procedure: ESOPHAGOGASTRODUODENOSCOPY (EGD);  Surgeon: Daneil Dolin, MD;  Location: [facility];  Service: Endoscopy;  Laterality: N/A;  1:30  . Savory dilation  10/28/2011    Procedure: SAVORY DILATION;  Surgeon: Daneil Dolin, MD;  Location: [facility];  Service: Endoscopy;  Laterality: N/A;  Venia Minks dilation  10/28/2011    Procedure: Venia Minks DILATION;  Surgeon: Daneil Dolin, MD;  Location: [facility];  Service: Endoscopy;  Laterality: N/A;  . Total ankle arthroplasty Left 11/25/2013    Procedure: TOTAL ANKLE ARTHOPLASTY;  Surgeon: Wylene Simmer, MD;  Location: [place];  Service: Orthopedics;  Laterality: Left;    Family History  Problem Relation Age of Onset  . Prostate cancer Father   .  Prostate cancer Son 78  . Prostate cancer Brother    Social History:  reports that he has never smoked. He has never used smokeless tobacco. He reports that he drinks about 0.6 - 1.2 oz of alcohol per week. He reports that he does not use illicit drugs.  Allergies:  Allergies  Allergen Reactions  . Oxycodone     Severe constipation  . Statins     Hard to walk, severe leg cramps  . Tetanus Toxoids     Swells up  . Penicillins Swelling and Rash  . Sulfa Antibiotics Swelling and Rash    Medications Prior to Admission  Medication Sig Dispense Refill  . amLODipine (NORVASC) 10 MG tablet Take 10 mg by mouth at bedtime.    Marland Kitchen aspirin EC 81 MG tablet Take 81 mg by mouth daily.    . B Complex-C (B-COMPLEX WITH VITAMIN C) tablet Take 1 tablet by mouth daily.    . finasteride (PROSCAR) 5 MG tablet Take 5 mg by mouth daily.    . fluticasone (FLONASE) 50 MCG/ACT nasal spray Place 1 spray into both nostrils daily as needed for allergies.     Marland Kitchen gabapentin (NEURONTIN) 300 MG capsule Take 300 mg by mouth 2 (two) times daily.     Marland Kitchen ibuprofen (ADVIL,MOTRIN) 600 MG tablet Take 600 mg by mouth daily.     Marland Kitchen levothyroxine (  SYNTHROID, LEVOTHROID) 25 MCG tablet Take 25 mcg by mouth daily before breakfast.    . Multiple Vitamin (MULITIVITAMIN WITH MINERALS) TABS Take 1 tablet by mouth daily.    . Omega-3 Fatty Acids (RA FISH OIL) 1400 MG CPDR Take 1,400 mg by mouth daily.    Marland Kitchen omeprazole (PRILOSEC) 20 MG capsule Take 20 mg by mouth at bedtime.    . Potassium 99 MG TABS Take 99 mg by mouth daily as needed (TAKES IN SUMMER TIME ONLY).    Marland Kitchen propranolol (INDERAL) 10 MG tablet Take 10 mg by mouth daily.    . Tamsulosin HCl (FLOMAX) 0.4 MG CAPS Take 0.4 mg by mouth daily after breakfast.     . diclofenac sodium (VOLTAREN) 1 % GEL Apply 2 g topically daily as needed (for pain).      No results found for this or any previous visit (from the past 48 hour(s)). No results found.  ROS  No recent f/c/n/v/wt  loss  Blood pressure 183/91, pulse 62, temperature 97.6 F (36.4 C), temperature source Oral, resp. rate 20, height 5\' 11"  (1.803 m), weight 90.311 kg (199 lb 1.6 oz), SpO2 98 %. Physical Exam  wn wd male in nad.  A and O x 4.  Mood and affect normal.  EOMI.  resp unlabored.  R ankle with healthy skin.  No lymphadenopathy.  5/5 strength in PF and DF of the ankle.  Sens to LT intact at the ankle.  Assessment/Plan R ankle arthritis - to OR for right total ankle replacement.  The risks and benefits of the alternative treatment options have been discussed in detail.  The patient wishes to proceed with surgery and specifically understands risks of bleeding, infection, nerve damage, blood clots, need for additional surgery, amputation and death.   Wylene Simmer 08/05/2014, 2:16 PM

## 2014-07-14 NOTE — Anesthesia Procedure Notes (Addendum)
Anesthesia Regional Block:  Popliteal block  Pre-Anesthetic Checklist: ,, timeout performed, Correct Patient, Correct Site, Correct Laterality, Correct Procedure, Correct Position, site marked, Risks and benefits discussed,  Surgical consent,  Pre-op evaluation,  At surgeon's request and post-op pain management  Laterality: Right  Prep: chloraprep       Needles:  Injection technique: Single-shot  Needle Type: Echogenic Stimulator Needle     Needle Length: 10cm 10 cm Needle Gauge: 21 and 21 G    Additional Needles:  Procedures: ultrasound guided (picture in chart) and nerve stimulator Popliteal block  Nerve Stimulator or Paresthesia:  Response: 0.4 mA,   Additional Responses:   Narrative:  Start time: 07/14/2014 2:00 PM End time: 07/14/2014 2:20 PM Injection made incrementally with aspirations every 5 mL.  Performed by: Personally  Anesthesiologist: Lillia Abed  Additional Notes: Monitors applied. Patient sedated. Sterile prep and drape,hand hygiene and sterile gloves were used. Relevant anatomy identified.Needle position confirmed.Local anesthetic injected incrementally after negative aspiration. Local anesthetic spread visualized around nerve(s). Vascular puncture avoided. No complications. Image printed for medical record.The patient tolerated the procedure well.  Additional Saphenous nerve block performed. 15cc Local Anesthetic mixture placed under ultrasonic guidance along the medio-inferior border of the Sartorious muscle 6 inches above the knee.  No Problems encountered.  Lillia Abed MD    Procedure Name: LMA Insertion Date/Time: 07/14/2014 2:41 PM Performed by: Garrison [place] T Pre-anesthesia Checklist: Patient identified, Emergency Drugs available, Suction available and Patient being monitored Patient Re-evaluated:Patient Re-evaluated prior to inductionOxygen Delivery Method: Circle system utilized Preoxygenation: Pre-oxygenation with 100% oxygen Intubation  Type: IV induction LMA: LMA inserted LMA Size: 5.0 Number of attempts: 1 Placement Confirmation: positive ETCO2 and breath sounds checked- equal and bilateral Tube secured with: Tape Dental Injury: Teeth and Oropharynx as per pre-operative assessment

## 2014-07-14 NOTE — Transfer of Care (Signed)
Immediate Anesthesia Transfer of Care Note  Patient: Brent Wilkins  Procedure(s) Performed: Procedure(s): RIGHT TOTAL ANKLE ARTHOPLASTY (Right)  Patient Location: PACU  Anesthesia Type:General  Level of Consciousness: awake, alert  and oriented  Airway & Oxygen Therapy: Patient Spontanous Breathing and Patient connected to face mask oxygen  Post-op Assessment: Report given to PACU RN, Post -op Vital signs reviewed and stable and Patient moving all extremities X 4  Post vital signs: Reviewed and stable  Complications: No apparent anesthesia complications

## 2014-07-15 ENCOUNTER — Encounter (HOSPITAL_COMMUNITY): Payer: Self-pay | Admitting: General Practice

## 2014-07-15 MED ORDER — ASPIRIN 325 MG PO TBEC: 325.0000 mg | DELAYED_RELEASE_TABLET | Freq: Every day | ORAL | Status: DC

## 2014-07-15 MED ORDER — HYDROMORPHONE HCL 2 MG PO TABS: 2.0000 mg | ORAL_TABLET | ORAL | Status: DC | PRN

## 2014-07-15 MED ORDER — DSS 100 MG PO CAPS: 100.0000 mg | ORAL_CAPSULE | Freq: Two times a day (BID) | ORAL | Status: DC

## 2014-07-15 MED ORDER — SENNA 8.6 MG PO TABS: 2.0000 | ORAL_TABLET | Freq: Two times a day (BID) | ORAL | Status: DC

## 2014-07-15 MED ORDER — ACETAMINOPHEN 325 MG PO TABS: 650.0000 mg | ORAL_TABLET | Freq: Four times a day (QID) | ORAL | Status: DC | PRN

## 2014-07-15 NOTE — Plan of Care (Signed)
Problem: Phase I Progression Outcomes Goal: Pain controlled with appropriate interventions Outcome: Progressing Goal: OOB as tolerated unless otherwise ordered Outcome: Progressing Goal: Incision/dressings dry and intact Outcome: Progressing Goal: Tubes/drains patent Outcome: Completed/Met Date Met:  07/15/14 Goal: Voiding-avoid urinary catheter unless indicated Outcome: Not Applicable Date Met:  40/08/67

## 2014-07-15 NOTE — Discharge Summary (Signed)
Physician Discharge Summary  Patient ID: Brent Wilkins MRN: [id number] DOB/AGE: [birth year] 73 y.o.  Admit date: 07/14/2014 Discharge date: 07/15/2014  Admission Diagnoses:  Htn, right ankle arthritis, s/p left total ankle replacement  Discharge Diagnoses:  Active Problems:   Post-traumatic arthritis of ankle same as above S/p R total ankle replacement  Discharged Condition: stable  Hospital Course: Pt was admitted and taken to the OR where he underwent right total ankle replacement and lateral ligament reconstruction.  He tolerated the procedure well and was transferred to 5N.  He did well with PT and was discharged in stable condition to home.  He is NWB on the R LE.  Consults: None  Significant Diagnostic Studies: none  Treatments: surgery: as above  Discharge Exam: Blood pressure 143/64, pulse 73, temperature 98.2 F (36.8 C), temperature source Oral, resp. rate 19, height 5\' 11"  (1.803 m), weight 90.311 kg (199 lb 1.6 oz), SpO2 94 %. wn wd male in nad.  A and Ox 4.  mood and affect normal  EOMi.  resp unlabored.  R LE immobilized in a cast.  Toes with brisk cap refill.  Normal sens to LT.  Disposition: 01-Home or Self Care  Discharge Instructions    Call MD / Call 911    Complete by:  As directed   If you experience chest pain or shortness of breath, CALL 911 and be transported to the hospital emergency room.  If you develope a fever above 101 F, pus (white drainage) or increased drainage or redness at the wound, or calf pain, call your surgeon's office.     Constipation Prevention    Complete by:  As directed   Drink plenty of fluids.  Prune juice may be helpful.  You may use a stool softener, such as Colace (over the counter) 100 mg twice a day.  Use MiraLax (over the counter) for constipation as needed.     Diet - low sodium heart healthy    Complete by:  As directed      Increase activity slowly as tolerated    Complete by:  As directed      Non weight bearing     Complete by:  As directed   Laterality:  right  Extremity:  Lower            Medication List    TAKE these medications        acetaminophen 325 MG tablet  Commonly known as:  TYLENOL  Take 2 tablets (650 mg total) by mouth every 6 (six) hours as needed for headache or mild pain.     amLODipine 10 MG tablet  Commonly known as:  NORVASC  Take 10 mg by mouth at bedtime.     aspirin 325 MG EC tablet  Take 1 tablet (325 mg total) by mouth daily.     B-complex with vitamin C tablet  Take 1 tablet by mouth daily.     diclofenac sodium 1 % Gel  Commonly known as:  VOLTAREN  Apply 2 g topically daily as needed (for pain).     DSS 100 MG Caps  Take 100 mg by mouth 2 (two) times daily.     finasteride 5 MG tablet  Commonly known as:  PROSCAR  Take 5 mg by mouth daily.     fluticasone 50 MCG/ACT nasal spray  Commonly known as:  FLONASE  Place 1 spray into both nostrils daily as needed for allergies.     gabapentin 300 MG capsule  Commonly known as:  NEURONTIN  Take 300 mg by mouth 2 (two) times daily.     HYDROmorphone 2 MG tablet  Commonly known as:  DILAUDID  Take 1 tablet (2 mg total) by mouth every 4 (four) hours as needed for moderate pain or severe pain.     ibuprofen 600 MG tablet  Commonly known as:  ADVIL,MOTRIN  Take 600 mg by mouth daily.     levothyroxine 25 MCG tablet  Commonly known as:  SYNTHROID, LEVOTHROID  Take 25 mcg by mouth daily before breakfast.     multivitamin with minerals Tabs tablet  Take 1 tablet by mouth daily.     omeprazole 20 MG capsule  Commonly known as:  PRILOSEC  Take 20 mg by mouth at bedtime.     Potassium 99 MG Tabs  Take 99 mg by mouth daily as needed (TAKES IN SUMMER TIME ONLY).     propranolol 10 MG tablet  Commonly known as:  INDERAL  Take 10 mg by mouth daily.     RA FISH OIL 1400 MG Cpdr  Take 1,400 mg by mouth daily.     senna 8.6 MG Tabs tablet  Commonly known as:  SENOKOT  Take 2 tablets (17.2 mg total)  by mouth 2 (two) times daily.     tamsulosin 0.4 MG Caps capsule  Commonly known as:  FLOMAX  Take 0.4 mg by mouth daily after breakfast.           Follow-up Information    Follow up with Burrell Hodapp, Jenny Reichmann, MD. Schedule an appointment as soon as possible for a visit in 3 weeks.   Specialty:  Orthopedic Surgery   Contact information:   [address] [place] [address] [phone]       Signed: Wylene Simmer 07/15/2014, 7:41 AM

## 2014-07-15 NOTE — Op Note (Signed)
NAMEWALKER, SITAR NO.:  [id number]  MEDICAL RECORD NO.:  [id number]  LOCATION:  5N02C                        FACILITY:  [facility]  PHYSICIAN:  Wylene Simmer, MD        DATE OF BIRTH:  10/18/1940  DATE OF PROCEDURE:  07/14/2014 DATE OF DISCHARGE:                              OPERATIVE REPORT   PREOPERATIVE DIAGNOSIS:  Right ankle posttraumatic arthritis.  POSTOPERATIVE DIAGNOSES: 1. Right ankle posttraumatic arthritis. 2. Right ankle chronic lateral ligament instability.  PROCEDURES: 1. Right total ankle replacement (Star large tibia, small talus, 8-mm     poly). 2. Right ankle lateral ligament reconstruction.  SURGEON:  Wylene Simmer, MD  ANESTHESIA:  General, regional.  ESTIMATED BLOOD LOSS:  Minimal.  TOURNIQUET TIME:  115 minutes at 250 mmHg.  COMPLICATIONS:  None apparent.  DISPOSITION:  Extubated, awake, and stable to recovery.  INDICATIONS FOR PROCEDURE:  The patient is a 73 year old male with a long history of right ankle instability.  He has developed posttraumatic arthritis.  He has failed nonoperative treatment today including activity modification, oral anti-inflammatories, bracing, and shoe ware modification.  He has undergone left total ankle replacement and has been pleased with the outcome.  He presents today for right total ankle replacement.  He understands the risks and benefits, the alternative treatment options, and elects surgical treatment.  He specifically understands risks of bleeding, infection, nerve damage, blood clots, need for additional surgery, continued pain, amputation, and death.  PROCEDURE IN DETAIL:  After preoperative consent was obtained and the correct operative site was identified, the patient was brought to the operating room and placed supine on the operating table.  General anesthesia was induced.  Preoperative antibiotics were administered. Surgical time-out was taken.  The right lower extremity was prepped  and draped in standard sterile fashion with a tourniquet around the thigh. The extremity was exsanguinated and the tourniquet was inflated to 250 mmHg.  A longitudinal incision was then made over the extensor hallucis longus tendon.  The interval between the EHL and tibialis anterior was then developed.  The neurovascular bundle was identified.  It was mobilized and retracted laterally.  It was protected throughout the case.  The anterior aspect of the tibia was exposed.  The joint capsule was then elevated medially and laterally.  The anterior osteophytes were removed with a rongeur.  A stab incision was then made at the tibial tubercle.  Quarter-inch osteotome placed in the medial gutter was used to align the 3.2-mm guidepin proximally.  The cutting jig was then applied.  The rotation was set with a T-handle distally parallel to the osteotome in the gutter.  The rotation screw was then tightened.  The cutting block was then provisionally pinned distally.  AP and lateral radiographs were then used to align the cutting guide with the tibia in the AP and lateral planes.  The cutting block was then pinned into position.  The distal portion of the block was then aligned with the tibial plafond using the [facility] wing guide and was then pinned into position after setting the medial-lateral alignment on the AP view.  At this point, the distal block was pinned into position.  The distal tibial cut was made and the waste bone was removed.  The resection gap was measured and was appropriate.  The remaining medial cartilage was removed off the dome of the talus.  The talar cutting guide was inserted and pinned into position.  The talar cut was made.  The talus was then sized as a size small, which matched the contralateral side.  The datum was placed in position and pinned.  The anterior-posterior cutting guide was then inserted and the anterior and posterior cuts were made.  The medial-lateral  cutting guide was then inserted and the medial and lateral cuts were made.  All the waste bone was removed.  The wound was irrigated copiously.  The window trial was inserted and was noted to fit appropriately.  It was pinned into position and the keel hole was drilled.  The window trial was removed and the keel was punched.  The wound was irrigated copiously again.  Some vancomycin powder was placed on the dome of the talus and the talar component was impacted into position without difficulty.  The tibial component was sized as a large, which again matched the size of the contralateral side.  A trial poly spacer was inserted followed by the tibial trial.  AP and lateral radiographs confirmed appropriate position of the trial.  The two barrel holes were then drilled and punched.  The wound was again irrigated and the tibial component was then impacted into position.  Trial poly was inserted.  There was considerable gapping noted at the lateral joint line due to insufficiency of the lateral ankle ligaments.  This primarily seemed to be the anterior talofibular ligament that was incompetent.  The deltoid ligament was noted to be scarred in and quite contracted.  It was released with subperiosteal dissection over the medial malleolus.  This partially corrected the imbalance of the ligaments, but the decision was made to proceed with lateral ligament reconstruction.  At this point, a pilot hole was drilled in the fibula at the level of the ATFL.  A 3.5-mm fully-threaded Arthrex corkscrew anchor was inserted.  The anchor was then used to repair the ATFL remnants back to the anterior aspect of the fibula.  The alignment at the level of the ankle was noted to be corrected with this ligament reconstruction.  Wound was then again irrigated copiously.  The final 8- mm polyethylene spacer was inserted without difficulty.  Vancomycin powder was again sprinkled throughout the anterior aspect of the  ankle. Ankle joint capsule was repaired with simple sutures of 0 Vicryl.  The extensor retinaculum was then repaired with simple and running sutures of 0 Vicryl.  Subcutaneous tissue was approximated with 3-0 Monocryl and a running 3-0 nylon was used to close the skin incision.  Final AP, mortise, and lateral radiographs had been obtained showing appropriate position of all hardware and appropriate alignment of the ankle joint. The proximal guidepin was removed and the incision closed with horizontal mattress suture of 3-0 nylon.  The drain had been placed in the deep portion of the wound prior to closure.  Sterile dressings were applied followed by a well-padded short-leg cast.  The tourniquet was released at 115 minutes after application of the dressings.  The patient was then awakened from anesthesia and transported to the recovery room in stable condition.  FOLLOWUP PLAN:  The patient will be nonweightbearing on the right lower extremity.  He will be admitted for this inpatient only procedure.  He will have physical therapy  in the morning and we will start DVT prophylaxis tomorrow.    Wylene Simmer, MD    JH/MEDQ  D:  07/14/2014  T:  07/15/2014  Job:  [id number]

## 2014-07-15 NOTE — Discharge Instructions (Signed)
Brent Simmer, MD [place]  Please read the following information regarding your care after surgery.  Medications  You only need a prescription for the narcotic pain medicine (ex. oxycodone, Percocet, Norco).  All of the other medicines listed below are available over the counter. X acetominophen (Tylenol) 650 mg every 4-6 hours as you need for minor pain X dilaudid as prescribed for moderate to severe pain   Narcotic pain medicine (ex. oxycodone, Percocet, Vicodin) will cause constipation.  To prevent this problem, take the following medicines while you are taking any pain medicine. X docusate sodium (Colace) 100 mg twice a day X senna (Senokot) 2 tablets twice a day  X To help prevent blood clots, take an aspirin (325 mg) once a day for a month after surgery.  You should also get up every hour while you are awake to move around.    Weight Bearing ? Bear weight when you are able on your operated leg or foot. ? Bear weight only on the heel of your operated foot in the post-op shoe. X Do not bear any weight on the operated leg or foot.  Cast / Splint / Dressing X Keep your splint or cast clean and dry.  Dont put anything (coat hanger, pencil, etc) down inside of it.  If it gets damp, use a hair dryer on the cool setting to dry it.  If it gets soaked, call the office to schedule an appointment for a cast change. ? Remove your dressing 3 days after surgery and cover the incisions with dry dressings.    After your dressing, cast or splint is removed; you may shower, but do not soak or scrub the wound.  Allow the water to run over it, and then gently pat it dry.  Swelling It is normal for you to have swelling where you had surgery.  To reduce swelling and pain, keep your toes above your nose for at least 3 days after surgery.  It may be necessary to keep your foot or leg elevated for several weeks.  If it hurts, it should be elevated.  Follow Up Call my office at [phone]  when you are discharged from the hospital or surgery center to schedule an appointment to be seen two weeks after surgery.  Call my office at [phone] if you develop a fever >101.5 F, nausea, vomiting, bleeding from the surgical site or severe pain.

## 2014-07-15 NOTE — Progress Notes (Signed)
Utilization review completed.  

## 2014-07-15 NOTE — Evaluation (Signed)
Physical Therapy Evaluation Patient Details Name: Brent Wilkins MRN: [id number] DOB: 10-23-1940 Today's Date: 07/15/2014   History of Present Illness  Pt was admitted and taken to the OR where he underwent right total ankle replacement and lateral ligament reconstruction.  He tolerated the procedure well and was transferred to 5N  Clinical Impression  Pt tolerating all upright mobility very well, including negotiation of stairs.  Will hold off on OP PT until able to [facility] through RLE.  RN made aware ready for D/C.    Follow Up Recommendations No PT follow up (can do OP PT once able to [facility] throug RLE)    Equipment Recommendations  None recommended by PT    Recommendations for Other Services       Precautions / Restrictions Precautions Precautions: Fall Restrictions Weight Bearing Restrictions: Yes RLE Weight Bearing: Non weight bearing      Mobility  Bed Mobility Overal bed mobility: Modified Independent             General bed mobility comments: Pt able to sit at EOB with HOB flat and without rails.   Transfers Overall transfer level: Needs assistance Equipment used: Rolling walker (2 wheeled) Transfers: Sit to/from Stand Sit to Stand: Supervision         General transfer comment: Very min cues for hand placement  Ambulation/Gait Ambulation/Gait assistance: Supervision Ambulation Distance (Feet): 60 Feet Assistive device: Rolling walker (2 wheeled) Gait Pattern/deviations: Step-to pattern;Trunk flexed Gait velocity: decreased   General Gait Details: Pt with good recall of sequencing with RW, however does require cues for upright and relaxed posture.   Stairs Stairs: Yes Stairs assistance: Min assist Stair Management: One rail Left;With crutches;Step to pattern;Forwards (single crutch) Number of Stairs: 5 General stair comments: Pt able to perform stairs as he has done in past from previous sx.  performed 5 steps with L handrail and single crutch.  Assist  to steady with cues for placement of crutch.  Educated that he should have hands on steadying assist for stairs.  Pt verbalized understanding and states he "never does stairs alone."   Wheelchair Mobility    Modified Rankin (Stroke Patients Only)       Balance Overall balance assessment: Needs assistance Sitting-balance support: Feet supported Sitting balance-Leahy Scale: Normal     Standing balance support: Single extremity supported;During functional activity Standing balance-Leahy Scale: Normal Standing balance comment: Pt able to stand on LLE while removing single extremity from RW to adjust shorts at S level.                              Pertinent Vitals/Pain Pain Assessment: 0-10 Pain Score: 5  Pain Location: R ankle Pain Descriptors / Indicators: Aching Pain Intervention(s): Monitored during session;Repositioned    Home Living Family/patient expects to be discharged to:: Private residence Living Arrangements: Spouse/significant other Available Help at Discharge: Family;Available 24 hours/day Type of Home: House Home Access: Stairs to enter Entrance Stairs-Rails: Left Entrance Stairs-Number of Steps: 3 Home Layout: One level Home Equipment: Walker - 2 wheels;Walker - standard;Bedside commode;Crutches      Prior Function Level of Independence: Independent               Hand Dominance   Dominant Hand: Right    Extremity/Trunk Assessment               Lower Extremity Assessment: Overall WFL for tasks assessed      Cervical /  Trunk Assessment: Normal  Communication   Communication: No difficulties  Cognition Arousal/Alertness: Awake/alert Behavior During Therapy: WFL for tasks assessed/performed Overall Cognitive Status: Within Functional Limits for tasks assessed                      General Comments      Exercises        Assessment/Plan    PT Assessment All further PT needs can be met in the next venue of care   PT Diagnosis Difficulty walking;Acute pain   PT Problem List Decreased balance;Decreased mobility  PT Treatment Interventions     PT Goals (Current goals can be found in the Care Plan section) Acute Rehab PT Goals Patient Stated Goal: to return home today PT Goal Formulation: All assessment and education complete, DC therapy Time For Goal Achievement: 07/16/14 Potential to Achieve Goals: Good    Frequency     Barriers to discharge        Co-evaluation               End of Session   Activity Tolerance: Patient tolerated treatment well Patient left: in chair;with call bell/phone within reach Nurse Communication: Mobility status         Time: 8416-6063 PT Time Calculation (min) (ACUTE ONLY): 22 min   Charges:   PT Evaluation $Initial PT Evaluation Tier I: 1 Procedure PT Treatments $Gait Training: 8-22 mins   PT G CodesDenice Bors 07/15/2014, 9:52 AM

## 2014-09-05 DIAGNOSIS — Z471 Aftercare following joint replacement surgery: Secondary | ICD-10-CM | POA: Diagnosis not present

## 2014-09-05 DIAGNOSIS — M19071 Primary osteoarthritis, right ankle and foot: Secondary | ICD-10-CM | POA: Diagnosis not present

## 2014-09-05 DIAGNOSIS — Z96661 Presence of right artificial ankle joint: Secondary | ICD-10-CM | POA: Diagnosis not present

## 2014-09-12 ENCOUNTER — Other Ambulatory Visit: Payer: Self-pay | Admitting: Internal Medicine

## 2014-09-12 MED ORDER — PROPRANOLOL HCL 10 MG PO TABS: 10.0000 mg | ORAL_TABLET | Freq: Every day | ORAL | Status: DC

## 2014-09-12 NOTE — Telephone Encounter (Signed)
Received fax refill request  Rx # [id number] Medication:  Propranolol 10 mg tablet Qty 60 Sig:  Take one tablet by mouth twice a day if needed Physician:  Harrington Challenger

## 2014-09-15 DIAGNOSIS — M19071 Primary osteoarthritis, right ankle and foot: Secondary | ICD-10-CM | POA: Diagnosis not present

## 2014-09-20 DIAGNOSIS — M19071 Primary osteoarthritis, right ankle and foot: Secondary | ICD-10-CM | POA: Diagnosis not present

## 2014-09-22 DIAGNOSIS — M19071 Primary osteoarthritis, right ankle and foot: Secondary | ICD-10-CM | POA: Diagnosis not present

## 2014-09-27 DIAGNOSIS — M19071 Primary osteoarthritis, right ankle and foot: Secondary | ICD-10-CM | POA: Diagnosis not present

## 2014-09-29 DIAGNOSIS — M19071 Primary osteoarthritis, right ankle and foot: Secondary | ICD-10-CM | POA: Diagnosis not present

## 2014-10-04 DIAGNOSIS — M19071 Primary osteoarthritis, right ankle and foot: Secondary | ICD-10-CM | POA: Diagnosis not present

## 2014-10-06 DIAGNOSIS — M19071 Primary osteoarthritis, right ankle and foot: Secondary | ICD-10-CM | POA: Diagnosis not present

## 2014-10-11 DIAGNOSIS — M19071 Primary osteoarthritis, right ankle and foot: Secondary | ICD-10-CM | POA: Diagnosis not present

## 2014-10-13 DIAGNOSIS — M19071 Primary osteoarthritis, right ankle and foot: Secondary | ICD-10-CM | POA: Diagnosis not present

## 2014-10-14 DIAGNOSIS — M25511 Pain in right shoulder: Secondary | ICD-10-CM | POA: Diagnosis not present

## 2014-10-19 DIAGNOSIS — Z96661 Presence of right artificial ankle joint: Secondary | ICD-10-CM | POA: Diagnosis not present

## 2014-10-19 DIAGNOSIS — M19071 Primary osteoarthritis, right ankle and foot: Secondary | ICD-10-CM | POA: Diagnosis not present

## 2014-10-19 DIAGNOSIS — Z471 Aftercare following joint replacement surgery: Secondary | ICD-10-CM | POA: Diagnosis not present

## 2014-10-19 DIAGNOSIS — M722 Plantar fascial fibromatosis: Secondary | ICD-10-CM | POA: Diagnosis not present

## 2014-10-21 DIAGNOSIS — R972 Elevated prostate specific antigen [PSA]: Secondary | ICD-10-CM | POA: Diagnosis not present

## 2014-10-21 DIAGNOSIS — N401 Enlarged prostate with lower urinary tract symptoms: Secondary | ICD-10-CM | POA: Diagnosis not present

## 2014-10-21 DIAGNOSIS — N320 Bladder-neck obstruction: Secondary | ICD-10-CM | POA: Diagnosis not present

## 2014-10-26 ENCOUNTER — Other Ambulatory Visit: Payer: Self-pay | Admitting: Internal Medicine

## 2014-10-26 MED ORDER — PROPRANOLOL HCL 10 MG PO TABS: 10.0000 mg | ORAL_TABLET | Freq: Two times a day (BID) | ORAL | Status: DC | PRN

## 2014-10-26 NOTE — Telephone Encounter (Signed)
Needs revised RX sent to [facility] for Propanolol for twice a day / tg

## 2014-12-09 DIAGNOSIS — I10 Essential (primary) hypertension: Secondary | ICD-10-CM | POA: Diagnosis not present

## 2014-12-09 DIAGNOSIS — K219 Gastro-esophageal reflux disease without esophagitis: Secondary | ICD-10-CM | POA: Diagnosis not present

## 2014-12-09 DIAGNOSIS — E663 Overweight: Secondary | ICD-10-CM | POA: Diagnosis not present

## 2014-12-09 DIAGNOSIS — E782 Mixed hyperlipidemia: Secondary | ICD-10-CM | POA: Diagnosis not present

## 2014-12-09 DIAGNOSIS — R5383 Other fatigue: Secondary | ICD-10-CM | POA: Diagnosis not present

## 2014-12-12 DIAGNOSIS — Z23 Encounter for immunization: Secondary | ICD-10-CM | POA: Diagnosis not present

## 2014-12-23 ENCOUNTER — Telehealth: Payer: Self-pay | Admitting: Internal Medicine

## 2014-12-23 NOTE — Telephone Encounter (Signed)
Pt needs a refill on Amlodipine 5 mg twice a day, needs to be sent to [facility] in

## 2014-12-26 MED ORDER — AMLODIPINE BESYLATE 5 MG PO TABS: 5.0000 mg | ORAL_TABLET | Freq: Two times a day (BID) | ORAL | Status: DC

## 2014-12-26 NOTE — Telephone Encounter (Signed)
Refill complete 

## 2014-12-26 NOTE — Addendum Note (Signed)
Addended by: Levonne Hubert on: 12/26/2014 11:32 AM   Modules accepted: Orders

## 2015-01-11 DIAGNOSIS — E063 Autoimmune thyroiditis: Secondary | ICD-10-CM | POA: Diagnosis not present

## 2015-01-13 DIAGNOSIS — Z96661 Presence of right artificial ankle joint: Secondary | ICD-10-CM | POA: Diagnosis not present

## 2015-01-13 DIAGNOSIS — M79671 Pain in right foot: Secondary | ICD-10-CM | POA: Diagnosis not present

## 2015-01-13 DIAGNOSIS — Z471 Aftercare following joint replacement surgery: Secondary | ICD-10-CM | POA: Diagnosis not present

## 2015-02-08 DIAGNOSIS — E063 Autoimmune thyroiditis: Secondary | ICD-10-CM | POA: Diagnosis not present

## 2015-02-09 DIAGNOSIS — H2589 Other age-related cataract: Secondary | ICD-10-CM | POA: Diagnosis not present

## 2015-03-02 ENCOUNTER — Encounter: Payer: Self-pay | Admitting: Cardiovascular Disease

## 2015-03-10 DIAGNOSIS — Z96661 Presence of right artificial ankle joint: Secondary | ICD-10-CM | POA: Diagnosis not present

## 2015-03-10 DIAGNOSIS — Z471 Aftercare following joint replacement surgery: Secondary | ICD-10-CM | POA: Diagnosis not present

## 2015-03-10 DIAGNOSIS — M19071 Primary osteoarthritis, right ankle and foot: Secondary | ICD-10-CM | POA: Diagnosis not present

## 2015-03-14 DIAGNOSIS — H25811 Combined forms of age-related cataract, right eye: Secondary | ICD-10-CM | POA: Diagnosis not present

## 2015-03-14 DIAGNOSIS — H25813 Combined forms of age-related cataract, bilateral: Secondary | ICD-10-CM | POA: Diagnosis not present

## 2015-03-14 DIAGNOSIS — H25812 Combined forms of age-related cataract, left eye: Secondary | ICD-10-CM | POA: Diagnosis not present

## 2015-03-21 NOTE — Patient Instructions (Signed)
Brent Wilkins  03/21/2015     @PREFPERIOPPHARMACY @   Your procedure is scheduled on 03/27/2015.  Report to [facility] at 12:30 P.M.  Call this number if you have problems the morning of surgery:  [phone]   Remember:  Do not eat food or drink liquids after midnight.  Take these medicines the morning of surgery with A SIP OF WATER Inderal, Amlodipine, Proscar, Flonase, Gabapentin, Synthroid, Prilosec, Flomax   Do not wear jewelry, make-up or nail polish.  Do not wear lotions, powders, or perfumes.  You may wear deodorant.  Do not shave 48 hours prior to surgery.  Men may shave face and neck.  Do not bring valuables to the hospital.  [facility] is not responsible for any belongings or valuables.  Contacts, dentures or bridgework may not be worn into surgery.  Leave your suitcase in the car.  After surgery it may be brought to your room.  For patients admitted to the hospital, discharge time will be determined by your treatment team.  Patients discharged the day of surgery will not be allowed to drive home.    Please read over the following fact sheets that you were given. Anesthesia Post-op Instructions     PATIENT INSTRUCTIONS POST-ANESTHESIA  IMMEDIATELY FOLLOWING SURGERY:  Do not drive or operate machinery for the first twenty four hours after surgery.  Do not make any important decisions for twenty four hours after surgery or while taking narcotic pain medications or sedatives.  If you develop intractable nausea and vomiting or a severe headache please notify your doctor immediately.  FOLLOW-UP:  Please make an appointment with your surgeon as instructed. You do not need to follow up with anesthesia unless specifically instructed to do so.  WOUND CARE INSTRUCTIONS (if applicable):  Keep a dry clean dressing on the anesthesia/puncture wound site if there is drainage.  Once the wound has quit draining you may leave it open to air.  Generally you should leave the  bandage intact for twenty four hours unless there is drainage.  If the epidural site drains for more than 36-48 hours please call the anesthesia department.  QUESTIONS?:  Please feel free to call your physician or the hospital operator if you have any questions, and they will be happy to assist you.      Cataract Surgery Care After Refer to this sheet in the next few weeks. These instructions provide you with information on caring for yourself after your procedure. Your caregiver may also give you more specific instructions. Your treatment has been planned according to current medical practices, but problems sometimes occur. Call your caregiver if you have any problems or questions after your procedure.  HOME CARE INSTRUCTIONS   Avoid strenuous activities as directed by your caregiver.  Ask your caregiver when you can resume driving.  Use eyedrops or other medicines to help healing and control pressure inside your eye as directed by your caregiver.  Only take over-the-counter or prescription medicines for pain, discomfort, or fever as directed by your caregiver.  Do not to touch or rub your eyes.  You may be instructed to use a protective shield during the first few days and nights after surgery. If not, wear sunglasses to protect your eyes. This is to protect the eye from pressure or from being accidentally bumped.  Keep the area around your eye clean and dry. Avoid swimming or allowing water to hit you directly in the face while showering. Keep soap and shampoo out  of your eyes.  Do not bend or lift heavy objects. Bending increases pressure in the eye. You can walk, climb stairs, and do light household chores.  Do not put a contact lens into the eye that had surgery until your caregiver says it is okay to do so.  Ask your doctor when you can return to work. This will depend on the kind of work that you do. If you work in a dusty environment, you may be advised to wear protective eyewear  for a period of time.  Ask your caregiver when it will be safe to engage in sexual activity.  Continue with your regular eye exams as directed by your caregiver. What to expect:  It is normal to feel itching and mild discomfort for a few days after cataract surgery. Some fluid discharge is also common, and your eye may be sensitive to light and touch.  After 1 to 2 days, even moderate discomfort should disappear. In most cases, healing will take about 6 weeks.  If you received an intraocular lens (IOL), you may notice that colors are very bright or have a blue tinge. Also, if you have been in bright sunlight, everything may appear reddish for a few hours. If you see these color tinges, it is because your lens is clear and no longer cloudy. Within a few months after receiving an IOL, these extra colors should go away. When you have healed, you will probably need new glasses. SEEK MEDICAL CARE IF:   You have increased bruising around your eye.  You have discomfort not helped by medicine. SEEK IMMEDIATE MEDICAL CARE IF:   You have a fever.  You have a worsening or sudden vision loss.  You have redness, swelling, or increasing pain in the eye.  You have a thick discharge from the eye that had surgery. MAKE SURE YOU:  Understand these instructions.  Will watch your condition.  Will get help right away if you are not doing well or get worse. Document Released: 02/08/2005 Document Revised: 10/14/2011 Document Reviewed: 03/15/2011 [facility] Patient Information 2015 [place], [place]. This information is not intended to replace advice given to you by your health care provider. Make sure you discuss any questions you have with your health care provider.

## 2015-03-22 ENCOUNTER — Encounter (HOSPITAL_COMMUNITY): Payer: Self-pay

## 2015-03-22 ENCOUNTER — Other Ambulatory Visit: Payer: Self-pay

## 2015-03-22 ENCOUNTER — Encounter (HOSPITAL_COMMUNITY)
Admission: RE | Admit: 2015-03-22 | Discharge: 2015-03-22 | Disposition: A | Discharge status: Home / Self Care | Payer: Medicare Other | Source: Ambulatory Visit | Attending: Ophthalmology | Admitting: Ophthalmology

## 2015-03-22 DIAGNOSIS — Z01818 Encounter for other preprocedural examination: Secondary | ICD-10-CM | POA: Diagnosis not present

## 2015-03-22 DIAGNOSIS — H2511 Age-related nuclear cataract, right eye: Secondary | ICD-10-CM | POA: Insufficient documentation

## 2015-03-22 LAB — BASIC METABOLIC PANEL
Anion gap: 8 (ref 5–15)
BUN: 23 mg/dL — AB (ref 6–20)
CALCIUM: 9.3 mg/dL (ref 8.9–10.3)
CO2: 26 mmol/L (ref 22–32)
CREATININE: 1.2 mg/dL (ref 0.61–1.24)
Chloride: 106 mmol/L (ref 101–111)
GFR calc non Af Amer: 57 mL/min — ABNORMAL LOW (ref >60)
Glucose, Bld: 92 mg/dL (ref 65–99)
Potassium: 4.7 mmol/L (ref 3.5–5.1)
Sodium: 140 mmol/L (ref 135–145)

## 2015-03-22 LAB — CBC
HEMATOCRIT: 43.8 % (ref 39.0–52.0)
Hemoglobin: 15.1 g/dL (ref 13.0–17.0)
MCH: 31.8 pg (ref 26.0–34.0)
MCHC: 34.5 g/dL (ref 30.0–36.0)
MCV: 92.2 fL (ref 78.0–100.0)
Platelets: 207 10*3/uL (ref 150–400)
RBC: 4.75 MIL/uL (ref 4.22–5.81)
RDW: 14.8 % (ref 11.5–15.5)
WBC: 8 10*3/uL (ref 4.0–10.5)

## 2015-03-24 MED ORDER — PHENYLEPHRINE HCL 2.5 % OP SOLN
OPHTHALMIC | Status: AC
  Filled 2015-03-24: qty 15

## 2015-03-24 MED ORDER — LIDOCAINE HCL (PF) 1 % IJ SOLN
INTRAMUSCULAR | Status: AC
  Filled 2015-03-24: qty 2

## 2015-03-24 MED ORDER — LIDOCAINE HCL 3.5 % OP GEL
OPHTHALMIC | Status: AC
  Filled 2015-03-24: qty 1

## 2015-03-24 MED ORDER — CYCLOPENTOLATE-PHENYLEPHRINE OP SOLN OPTIME - NO CHARGE
OPHTHALMIC | Status: AC
  Filled 2015-03-24: qty 2

## 2015-03-24 MED ORDER — NEOMYCIN-POLYMYXIN-DEXAMETH 3.5-10000-0.1 OP SUSP
OPHTHALMIC | Status: AC
  Filled 2015-03-24: qty 5

## 2015-03-24 MED ORDER — TETRACAINE HCL 0.5 % OP SOLN
OPHTHALMIC | Status: AC
  Filled 2015-03-24: qty 2

## 2015-03-27 ENCOUNTER — Encounter (HOSPITAL_COMMUNITY): Payer: Self-pay | Admitting: *Deleted

## 2015-03-27 ENCOUNTER — Ambulatory Visit (HOSPITAL_COMMUNITY): Payer: Medicare Other | Admitting: Anesthesiology

## 2015-03-27 ENCOUNTER — Encounter (HOSPITAL_COMMUNITY)
Admission: RE | Disposition: A | Discharge status: Home / Self Care | Payer: Self-pay | Source: Ambulatory Visit | Attending: Ophthalmology

## 2015-03-27 ENCOUNTER — Ambulatory Visit (HOSPITAL_COMMUNITY)
Admission: RE | Admit: 2015-03-27 | Discharge: 2015-03-27 | Disposition: A | Discharge status: Home / Self Care | Payer: Medicare Other | Source: Ambulatory Visit | Attending: Ophthalmology | Admitting: Ophthalmology

## 2015-03-27 DIAGNOSIS — Z7951 Long term (current) use of inhaled steroids: Secondary | ICD-10-CM | POA: Diagnosis not present

## 2015-03-27 DIAGNOSIS — Z7982 Long term (current) use of aspirin: Secondary | ICD-10-CM | POA: Insufficient documentation

## 2015-03-27 DIAGNOSIS — E039 Hypothyroidism, unspecified: Secondary | ICD-10-CM | POA: Diagnosis not present

## 2015-03-27 DIAGNOSIS — H25812 Combined forms of age-related cataract, left eye: Secondary | ICD-10-CM | POA: Diagnosis not present

## 2015-03-27 DIAGNOSIS — K219 Gastro-esophageal reflux disease without esophagitis: Secondary | ICD-10-CM | POA: Insufficient documentation

## 2015-03-27 DIAGNOSIS — M1990 Unspecified osteoarthritis, unspecified site: Secondary | ICD-10-CM | POA: Diagnosis not present

## 2015-03-27 DIAGNOSIS — H52202 Unspecified astigmatism, left eye: Secondary | ICD-10-CM | POA: Diagnosis not present

## 2015-03-27 DIAGNOSIS — H2181 Floppy iris syndrome: Secondary | ICD-10-CM | POA: Diagnosis not present

## 2015-03-27 DIAGNOSIS — I10 Essential (primary) hypertension: Secondary | ICD-10-CM | POA: Diagnosis not present

## 2015-03-27 DIAGNOSIS — Z01810 Encounter for preprocedural cardiovascular examination: Secondary | ICD-10-CM | POA: Diagnosis not present

## 2015-03-27 DIAGNOSIS — Z791 Long term (current) use of non-steroidal anti-inflammatories (NSAID): Secondary | ICD-10-CM | POA: Insufficient documentation

## 2015-03-27 DIAGNOSIS — Z79899 Other long term (current) drug therapy: Secondary | ICD-10-CM | POA: Diagnosis not present

## 2015-03-27 DIAGNOSIS — H269 Unspecified cataract: Secondary | ICD-10-CM | POA: Diagnosis not present

## 2015-03-27 DIAGNOSIS — H2512 Age-related nuclear cataract, left eye: Secondary | ICD-10-CM | POA: Diagnosis not present

## 2015-03-27 HISTORY — PX: CATARACT EXTRACTION W/PHACO: SHX586

## 2015-03-27 SURGERY — PHACOEMULSIFICATION, CATARACT, WITH IOL INSERTION
Anesthesia: Monitor Anesthesia Care | Site: Eye | Laterality: Left

## 2015-03-27 MED ORDER — POVIDONE-IODINE 5 % OP SOLN
OPHTHALMIC | Status: DC | PRN
  Administered 2015-03-27: 1 via OPHTHALMIC

## 2015-03-27 MED ORDER — LIDOCAINE HCL (PF) 1 % IJ SOLN
INTRAMUSCULAR | Status: DC | PRN
  Administered 2015-03-27: .5 mL

## 2015-03-27 MED ORDER — MIDAZOLAM HCL 2 MG/2ML IJ SOLN
1.0000 mg | INTRAMUSCULAR | Status: DC | PRN
  Administered 2015-03-27: 2 mg via INTRAVENOUS

## 2015-03-27 MED ORDER — TETRACAINE HCL 0.5 % OP SOLN
1.0000 [drp] | OPHTHALMIC | Status: AC
  Administered 2015-03-27 (×3): 1 [drp] via OPHTHALMIC

## 2015-03-27 MED ORDER — MIDAZOLAM HCL 2 MG/2ML IJ SOLN
INTRAMUSCULAR | Status: AC
  Filled 2015-03-27: qty 2

## 2015-03-27 MED ORDER — LIDOCAINE HCL 3.5 % OP GEL
1.0000 "application " | Freq: Once | OPHTHALMIC | Status: AC
  Administered 2015-03-27: 1 via OPHTHALMIC

## 2015-03-27 MED ORDER — PHENYLEPHRINE HCL 2.5 % OP SOLN
1.0000 [drp] | OPHTHALMIC | Status: AC
  Administered 2015-03-27 (×3): 1 [drp] via OPHTHALMIC

## 2015-03-27 MED ORDER — LACTATED RINGERS IV SOLN
INTRAVENOUS | Status: DC
  Administered 2015-03-27: 1000 mL via INTRAVENOUS

## 2015-03-27 MED ORDER — FENTANYL CITRATE (PF) 100 MCG/2ML IJ SOLN
25.0000 ug | INTRAMUSCULAR | Status: AC
  Administered 2015-03-27 (×2): 25 ug via INTRAVENOUS

## 2015-03-27 MED ORDER — NEOMYCIN-POLYMYXIN-DEXAMETH 3.5-10000-0.1 OP SUSP
OPHTHALMIC | Status: DC | PRN
  Administered 2015-03-27: 2 [drp] via OPHTHALMIC

## 2015-03-27 MED ORDER — EPINEPHRINE HCL 1 MG/ML IJ SOLN
INTRAOCULAR | Status: DC | PRN
  Administered 2015-03-27: 500 mL

## 2015-03-27 MED ORDER — PROVISC 10 MG/ML IO SOLN
INTRAOCULAR | Status: DC | PRN
  Administered 2015-03-27: 0.85 mL via INTRAOCULAR

## 2015-03-27 MED ORDER — BSS IO SOLN
INTRAOCULAR | Status: DC | PRN
  Administered 2015-03-27: 15 mL

## 2015-03-27 MED ORDER — FENTANYL CITRATE (PF) 100 MCG/2ML IJ SOLN
INTRAMUSCULAR | Status: AC
  Filled 2015-03-27: qty 2

## 2015-03-27 MED ORDER — CYCLOPENTOLATE-PHENYLEPHRINE 0.2-1 % OP SOLN
1.0000 [drp] | OPHTHALMIC | Status: AC
  Administered 2015-03-27 (×3): 1 [drp] via OPHTHALMIC

## 2015-03-27 SURGICAL SUPPLY — 13 items
CLOTH BEACON ORANGE TIMEOUT ST (SAFETY) ×3 IMPLANT
EYE SHIELD UNIVERSAL CLEAR (GAUZE/BANDAGES/DRESSINGS) ×3 IMPLANT
GLOVE BIOGEL PI IND STRL 6.5 (GLOVE) ×1 IMPLANT
GLOVE BIOGEL PI INDICATOR 6.5 (GLOVE) ×2
GLOVE EXAM NITRILE MD LF STRL (GLOVE) ×3 IMPLANT
LENS IOL ACRYSOF IQ TORIC 15.0 ×3 IMPLANT
PAD ARMBOARD 7.5X6 YLW CONV (MISCELLANEOUS) ×3 IMPLANT
PROC W SPEC LENS (INTRAOCULAR LENS) ×3
PROCESS W SPEC LENS (INTRAOCULAR LENS) ×1 IMPLANT
SYRINGE LUER LOK 1CC (MISCELLANEOUS) ×3 IMPLANT
TAPE SURG TRANSPORE 1 IN (GAUZE/BANDAGES/DRESSINGS) ×1 IMPLANT
TAPE SURGICAL TRANSPORE 1 IN (GAUZE/BANDAGES/DRESSINGS) ×2
WATER STERILE IRR 250ML POUR (IV SOLUTION) ×3 IMPLANT

## 2015-03-27 NOTE — Op Note (Signed)
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  Surgeon: Tonny Branch, M.D.  Assistants: None  Anesthesia: Topical with MAC  Indications: Painless, progressive loss of vision with compromise of daily activities.  Surgery: Cataract Extraction with Intraocular lens Implant Left Eye  Discription: The patient had dilating drops and viscous lidocaine placed into the Left in the pre-op holding area. In the sitting position horizontal reference marks were made on the cornea.  After transfer to the operating room, a time out was performed. The patient was then prepped and draped. Beginning with a 77 degree blade a paracentesis port was made at the surgeon's 2 o'clock position. The anterior chamber was then filled with 1% non-preserved lidocaine. This was followed by filling the anterior chamber with Provisc. A 2.25mm keratome blade was used to make a clear cornea incision at the temporal limbus. A bent cystatome needle was used to create a continuous tear capsulotomy. Hydrodissection was performed with balanced salt solution on a Fine canula. The lens nucleus was then removed using the phacoemulsification handpiece. Residual cortex was removed with the I&A handpiece. The anterior chamber and capsular bag were refilled with Provisc. A posterior chamber intraocular lens was placed into the capsular bag with it's injector. Additional corneal marks were made on the 175/355 degree meridians. The implant was positioned with the Kuglan hook.  The Provisc was then removed from the anterior chamber and capsular bag with the I&A handpiece. The I&A handpiece was used to finally position the IOL. Stromal hydration of the main incision and paracentesis port was performed with BSS on a Fine canula. The wounds were tested for leak which was  negative. The patient tolerated the procedure well. There were no operative complications. The patient was then transferred to the recovery room in stable condition.  Complications: None  Specimen: None  EBL: None  Prosthetic device: Alcon AcrySof Toric [id number], power 12.5, SN [id number].

## 2015-03-27 NOTE — Anesthesia Postprocedure Evaluation (Signed)
  Anesthesia Post-op Note  Patient: Brent Wilkins  Procedure(s) Performed: Procedure(s): CATARACT EXTRACTION PHACO AND INTRAOCULAR LENS PLACEMENT LEFT EYE CDE=11.73 (Left)  Patient Location: Short Stay  Anesthesia Type:MAC  Level of Consciousness: awake, alert , oriented and patient cooperative  Airway and Oxygen Therapy: Patient Spontanous Breathing  Post-op Pain: none  Post-op Assessment: Post-op Vital signs reviewed, Patient's Cardiovascular Status Stable, Respiratory Function Stable, Patent Airway, No signs of Nausea or vomiting and Pain level controlled              Post-op Vital Signs: Reviewed and stable  Last Vitals:  Filed Vitals:   03/27/15 1320  BP: 140/77  Pulse:   Temp:   Resp: 14    Complications: No apparent anesthesia complications

## 2015-03-27 NOTE — Anesthesia Preprocedure Evaluation (Signed)
Anesthesia Evaluation  Patient identified by MRN, date of birth, ID band Patient awake    Reviewed: Allergy & Precautions, H&P , NPO status , Patient's Chart, lab work & pertinent test results  Airway Mallampati: II  TM Distance: >3 FB Neck ROM: Full    Dental  (+) Dental Advisory Given, Teeth Intact   Pulmonary          Cardiovascular hypertension, Pt. on medications and Pt. on home beta blockers     Neuro/Psych    GI/Hepatic GERD-  Medicated,  Endo/Other  Hypothyroidism   Renal/GU      Musculoskeletal  (+) Arthritis -,   Abdominal   Peds  Hematology   Anesthesia Other Findings   Reproductive/Obstetrics                             Anesthesia Physical Anesthesia Plan  ASA: III  Anesthesia Plan: MAC   Post-op Pain Management:    Induction: Intravenous  Airway Management Planned: Nasal Cannula  Additional Equipment:   Intra-op Plan:   Post-operative Plan:   Informed Consent: I have reviewed the patients History and Physical, chart, labs and discussed the procedure including the risks, benefits and alternatives for the proposed anesthesia with the patient or authorized representative who has indicated his/her understanding and acceptance.     Plan Discussed with:   Anesthesia Plan Comments:         Anesthesia Quick Evaluation

## 2015-03-27 NOTE — H&P (Signed)
I have reviewed the H&P, the patient was re-examined, and I have identified no interval changes in medical condition and plan of care since the history and physical of record  

## 2015-03-27 NOTE — Discharge Instructions (Signed)

## 2015-03-27 NOTE — Transfer of Care (Signed)
Immediate Anesthesia Transfer of Care Note  Patient: Brent Wilkins  Procedure(s) Performed: Procedure(s): CATARACT EXTRACTION PHACO AND INTRAOCULAR LENS PLACEMENT LEFT EYE CDE=11.73 (Left)  Patient Location: Short Stay  Anesthesia Type:MAC  Level of Consciousness: awake, alert , oriented and patient cooperative  Airway & Oxygen Therapy: Patient Spontanous Breathing  Post-op Assessment: Report given to RN, Post -op Vital signs reviewed and stable and Patient moving all extremities  Post vital signs: Reviewed and stable  Last Vitals:  Filed Vitals:   03/27/15 1320  BP: 140/77  Pulse:   Temp:   Resp: 14    Complications: No apparent anesthesia complications

## 2015-03-28 ENCOUNTER — Encounter (HOSPITAL_COMMUNITY): Payer: Medicare Other | Attending: Ophthalmology

## 2015-03-29 ENCOUNTER — Encounter (HOSPITAL_COMMUNITY): Payer: Self-pay | Admitting: Ophthalmology

## 2015-03-29 MED ORDER — TETRACAINE HCL 0.5 % OP SOLN
OPHTHALMIC | Status: AC
  Filled 2015-03-29: qty 2

## 2015-03-29 MED ORDER — NEOMYCIN-POLYMYXIN-DEXAMETH 3.5-10000-0.1 OP SUSP
OPHTHALMIC | Status: AC
  Filled 2015-03-29: qty 5

## 2015-03-29 MED ORDER — PHENYLEPHRINE HCL 2.5 % OP SOLN
OPHTHALMIC | Status: AC
  Filled 2015-03-29: qty 15

## 2015-03-29 MED ORDER — CYCLOPENTOLATE-PHENYLEPHRINE OP SOLN OPTIME - NO CHARGE
OPHTHALMIC | Status: AC
  Filled 2015-03-29: qty 2

## 2015-03-29 MED ORDER — LIDOCAINE HCL (PF) 1 % IJ SOLN
INTRAMUSCULAR | Status: AC
  Filled 2015-03-29: qty 2

## 2015-03-29 MED ORDER — LIDOCAINE HCL 3.5 % OP GEL
OPHTHALMIC | Status: AC
  Filled 2015-03-29: qty 1

## 2015-03-30 ENCOUNTER — Encounter (HOSPITAL_COMMUNITY)
Admission: RE | Disposition: A | Discharge status: Home / Self Care | Payer: Self-pay | Source: Ambulatory Visit | Attending: Ophthalmology

## 2015-03-30 ENCOUNTER — Ambulatory Visit (HOSPITAL_COMMUNITY): Payer: Medicare Other | Admitting: Anesthesiology

## 2015-03-30 ENCOUNTER — Ambulatory Visit (HOSPITAL_COMMUNITY)
Admission: RE | Admit: 2015-03-30 | Discharge: 2015-03-30 | Disposition: A | Discharge status: Home / Self Care | Payer: Medicare Other | Source: Ambulatory Visit | Attending: Ophthalmology | Admitting: Ophthalmology

## 2015-03-30 ENCOUNTER — Encounter (HOSPITAL_COMMUNITY): Payer: Self-pay | Admitting: *Deleted

## 2015-03-30 DIAGNOSIS — M1990 Unspecified osteoarthritis, unspecified site: Secondary | ICD-10-CM | POA: Diagnosis not present

## 2015-03-30 DIAGNOSIS — Z791 Long term (current) use of non-steroidal anti-inflammatories (NSAID): Secondary | ICD-10-CM | POA: Diagnosis not present

## 2015-03-30 DIAGNOSIS — Z79899 Other long term (current) drug therapy: Secondary | ICD-10-CM | POA: Insufficient documentation

## 2015-03-30 DIAGNOSIS — H269 Unspecified cataract: Secondary | ICD-10-CM | POA: Diagnosis not present

## 2015-03-30 DIAGNOSIS — K219 Gastro-esophageal reflux disease without esophagitis: Secondary | ICD-10-CM | POA: Insufficient documentation

## 2015-03-30 DIAGNOSIS — H25811 Combined forms of age-related cataract, right eye: Secondary | ICD-10-CM | POA: Diagnosis not present

## 2015-03-30 DIAGNOSIS — I10 Essential (primary) hypertension: Secondary | ICD-10-CM | POA: Diagnosis not present

## 2015-03-30 DIAGNOSIS — H52221 Regular astigmatism, right eye: Secondary | ICD-10-CM | POA: Diagnosis not present

## 2015-03-30 DIAGNOSIS — E079 Disorder of thyroid, unspecified: Secondary | ICD-10-CM | POA: Diagnosis not present

## 2015-03-30 DIAGNOSIS — Z8546 Personal history of malignant neoplasm of prostate: Secondary | ICD-10-CM | POA: Diagnosis not present

## 2015-03-30 HISTORY — PX: CATARACT EXTRACTION W/PHACO: SHX586

## 2015-03-30 SURGERY — PHACOEMULSIFICATION, CATARACT, WITH IOL INSERTION
Anesthesia: Monitor Anesthesia Care | Site: Eye | Laterality: Right

## 2015-03-30 MED ORDER — MIDAZOLAM HCL 2 MG/2ML IJ SOLN
1.0000 mg | INTRAMUSCULAR | Status: DC | PRN
  Administered 2015-03-30: 2 mg via INTRAVENOUS

## 2015-03-30 MED ORDER — FENTANYL CITRATE (PF) 100 MCG/2ML IJ SOLN
25.0000 ug | INTRAMUSCULAR | Status: AC
  Administered 2015-03-30 (×2): 25 ug via INTRAVENOUS

## 2015-03-30 MED ORDER — LIDOCAINE HCL 3.5 % OP GEL: 1.0000 "application " | Freq: Once | OPHTHALMIC | Status: DC

## 2015-03-30 MED ORDER — MIDAZOLAM HCL 2 MG/2ML IJ SOLN
INTRAMUSCULAR | Status: AC
  Filled 2015-03-30: qty 2

## 2015-03-30 MED ORDER — FENTANYL CITRATE (PF) 100 MCG/2ML IJ SOLN
INTRAMUSCULAR | Status: AC
  Filled 2015-03-30: qty 2

## 2015-03-30 MED ORDER — BSS IO SOLN
INTRAOCULAR | Status: DC | PRN
Start: 2015-03-30 — End: 2015-03-30
  Administered 2015-03-30: 15 mL

## 2015-03-30 MED ORDER — LIDOCAINE HCL (PF) 1 % IJ SOLN
INTRAOCULAR | Status: DC | PRN
  Administered 2015-03-30: .8 mL via OPHTHALMIC

## 2015-03-30 MED ORDER — NEOMYCIN-POLYMYXIN-DEXAMETH 3.5-10000-0.1 OP SUSP
OPHTHALMIC | Status: DC | PRN
  Administered 2015-03-30: 2 [drp] via OPHTHALMIC

## 2015-03-30 MED ORDER — EPINEPHRINE HCL 1 MG/ML IJ SOLN
INTRAMUSCULAR | Status: AC
  Filled 2015-03-30: qty 1

## 2015-03-30 MED ORDER — PHENYLEPHRINE HCL 2.5 % OP SOLN
1.0000 [drp] | OPHTHALMIC | Status: AC
  Administered 2015-03-30 (×3): 1 [drp] via OPHTHALMIC

## 2015-03-30 MED ORDER — LACTATED RINGERS IV SOLN
INTRAVENOUS | Status: DC
  Administered 2015-03-30: 07:00:00 via INTRAVENOUS

## 2015-03-30 MED ORDER — CYCLOPENTOLATE-PHENYLEPHRINE 0.2-1 % OP SOLN
1.0000 [drp] | OPHTHALMIC | Status: AC
  Administered 2015-03-30 (×3): 1 [drp] via OPHTHALMIC

## 2015-03-30 MED ORDER — PROVISC 10 MG/ML IO SOLN
INTRAOCULAR | Status: DC | PRN
  Administered 2015-03-30: 0.85 mL via INTRAOCULAR

## 2015-03-30 MED ORDER — POVIDONE-IODINE 5 % OP SOLN
OPHTHALMIC | Status: DC | PRN
  Administered 2015-03-30: 1 via OPHTHALMIC

## 2015-03-30 MED ORDER — EPINEPHRINE HCL 1 MG/ML IJ SOLN
INTRAOCULAR | Status: DC | PRN
  Administered 2015-03-30: 500 mL

## 2015-03-30 MED ORDER — TETRACAINE HCL 0.5 % OP SOLN
1.0000 [drp] | OPHTHALMIC | Status: AC
  Administered 2015-03-30 (×3): 1 [drp] via OPHTHALMIC

## 2015-03-30 SURGICAL SUPPLY — 13 items
CLOTH BEACON ORANGE TIMEOUT ST (SAFETY) ×3 IMPLANT
EYE SHIELD UNIVERSAL CLEAR (GAUZE/BANDAGES/DRESSINGS) ×3 IMPLANT
GLOVE BIOGEL PI IND STRL 6.5 (GLOVE) ×1 IMPLANT
GLOVE BIOGEL PI INDICATOR 6.5 (GLOVE) ×2
GLOVE EXAM NITRILE MD LF STRL (GLOVE) ×3 IMPLANT
LENS IOL ACRYSOF IQ TORIC 15.0 ×3 IMPLANT
PAD ARMBOARD 7.5X6 YLW CONV (MISCELLANEOUS) ×3 IMPLANT
PROC W SPEC LENS (INTRAOCULAR LENS) ×3
PROCESS W SPEC LENS (INTRAOCULAR LENS) ×1 IMPLANT
SYRINGE LUER LOK 1CC (MISCELLANEOUS) ×3 IMPLANT
TAPE SURG TRANSPORE 1 IN (GAUZE/BANDAGES/DRESSINGS) ×1 IMPLANT
TAPE SURGICAL TRANSPORE 1 IN (GAUZE/BANDAGES/DRESSINGS) ×2
WATER STERILE IRR 250ML POUR (IV SOLUTION) ×3 IMPLANT

## 2015-03-30 NOTE — Transfer of Care (Signed)
Immediate Anesthesia Transfer of Care Note  Patient: Brent Wilkins  Procedure(s) Performed: Procedure(s): CATARACT EXTRACTION PHACO AND INTRAOCULAR LENS PLACEMENT RIGHT EYE CDE=7.35 (Right)  Patient Location: Short Stay  Anesthesia Type:MAC  Level of Consciousness: awake, alert , oriented and patient cooperative  Airway & Oxygen Therapy: Patient Spontanous Breathing  Post-op Assessment: Report given to RN, Post -op Vital signs reviewed and stable and Patient moving all extremities  Post vital signs: Reviewed and stable  Last Vitals:  Filed Vitals:   03/30/15 0750  BP: 147/99  Temp:   Resp: 16    Complications: No apparent anesthesia complications

## 2015-03-30 NOTE — Anesthesia Preprocedure Evaluation (Signed)
Anesthesia Evaluation  Patient identified by MRN, date of birth, ID band Patient awake    Reviewed: Allergy & Precautions, H&P , NPO status , Patient's Chart, lab work & pertinent test results  Airway Mallampati: II  TM Distance: >3 FB Neck ROM: Full    Dental  (+) Dental Advisory Given, Teeth Intact   Pulmonary          Cardiovascular hypertension, Pt. on medications and Pt. on home beta blockers     Neuro/Psych    GI/Hepatic GERD-  Medicated,  Endo/Other  Hypothyroidism   Renal/GU      Musculoskeletal  (+) Arthritis -,   Abdominal   Peds  Hematology   Anesthesia Other Findings   Reproductive/Obstetrics                             Anesthesia Physical Anesthesia Plan  ASA: III  Anesthesia Plan: MAC   Post-op Pain Management:    Induction: Intravenous  Airway Management Planned: Nasal Cannula  Additional Equipment:   Intra-op Plan:   Post-operative Plan:   Informed Consent: I have reviewed the patients History and Physical, chart, labs and discussed the procedure including the risks, benefits and alternatives for the proposed anesthesia with the patient or authorized representative who has indicated his/her understanding and acceptance.     Plan Discussed with:   Anesthesia Plan Comments:         Anesthesia Quick Evaluation

## 2015-03-30 NOTE — Discharge Instructions (Signed)

## 2015-03-30 NOTE — H&P (Signed)
I have reviewed the H&P, the patient was re-examined, and I have identified no interval changes in medical condition and plan of care since the history and physical of record  

## 2015-03-30 NOTE — Op Note (Signed)
Date of Admission: 03/30/2015  Date of Surgery: 03/30/2015  Pre-Op Dx: Cataract Right Eye  Post-Op Dx: Senile Combined Cataract Right Eye,  Dx Code H25.811, Astigmatism Right Eye, Dx Code G53.646  Surgeon: Tonny Branch, M.D.  Assistants: None  Anesthesia: Topical with MAC  Indications: Painless, progressive loss of vision with compromise of daily activities.  Surgery: Cataract Extraction with Intraocular lens Implant Right Eye  Discription: The patient had dilating drops and viscous lidocaine placed into the Right in the pre-op holding area. In the sitting position horizontal reference marks were made on the cornea.  After transfer to the operating room, a time out was performed. The patient was then prepped and draped. Beginning with a 41 degree blade a paracentesis port was made at the surgeon's 2 o'clock position. The anterior chamber was then filled with 1% non-preserved lidocaine. This was followed by filling the anterior chamber with Provisc. A 2.41mm keratome blade was used to make a clear cornea incision at the temporal limbus. A bent cystatome needle was used to create a continuous tear capsulotomy. Hydrodissection was performed with balanced salt solution on a Fine canula. The lens nucleus was then removed using the phacoemulsification handpiece. Residual cortex was removed with the I&A handpiece. The anterior chamber and capsular bag were refilled with Provisc. A posterior chamber intraocular lens was placed into the capsular bag with it's injector. Additional corneal marks were made on the 170/350 degree meridians.  The Provisc was then removed from the anterior chamber and capsular bag with the I&A handpiece. The implant was positioned with the Kuglan hook. Stromal hydration of the main incision and paracentesis port was performed with BSS on a Fine canula. The wounds were tested for leak which was negative. The patient tolerated the procedure well. There were no operative complications.  The patient was then transferred to the recovery room in stable condition.  Complications: None  Specimen: None  EBL: None  Prosthetic device: Alcon AcrySof Toric [id number], power 13.0, SN [id number].

## 2015-03-30 NOTE — Anesthesia Postprocedure Evaluation (Signed)
  Anesthesia Post-op Note  Patient: Brent Wilkins  Procedure(s) Performed: Procedure(s): CATARACT EXTRACTION PHACO AND INTRAOCULAR LENS PLACEMENT RIGHT EYE CDE=7.35 (Right)  Patient Location: Short Stay  Anesthesia Type:MAC  Level of Consciousness: awake, alert , oriented and patient cooperative  Airway and Oxygen Therapy: Patient Spontanous Breathing  Post-op Pain: none  Post-op Assessment: Post-op Vital signs reviewed, Patient's Cardiovascular Status Stable, Respiratory Function Stable, Patent Airway, No signs of Nausea or vomiting and Pain level controlled              Post-op Vital Signs: Reviewed and stable  Last Vitals:  Filed Vitals:   03/30/15 0750  BP: 147/99  Temp:   Resp: 16    Complications: No apparent anesthesia complications

## 2015-03-31 ENCOUNTER — Encounter (HOSPITAL_COMMUNITY): Payer: Self-pay | Admitting: Ophthalmology

## 2015-04-11 ENCOUNTER — Encounter: Payer: Self-pay | Admitting: Internal Medicine

## 2015-04-17 ENCOUNTER — Ambulatory Visit (INDEPENDENT_AMBULATORY_CARE_PROVIDER_SITE_OTHER): Payer: Medicare Other | Admitting: *Deleted

## 2015-04-17 DIAGNOSIS — I10 Essential (primary) hypertension: Secondary | ICD-10-CM | POA: Diagnosis not present

## 2015-04-17 NOTE — Addendum Note (Signed)
Addended by: Merlene Laughter on: 04/17/2015 10:27 AM   Modules accepted: Orders, Medications

## 2015-04-24 ENCOUNTER — Ambulatory Visit (INDEPENDENT_AMBULATORY_CARE_PROVIDER_SITE_OTHER): Payer: Medicare Other | Admitting: Internal Medicine

## 2015-04-24 ENCOUNTER — Encounter: Payer: Self-pay | Admitting: Internal Medicine

## 2015-04-24 VITALS — BP 122/78 | HR 62 | Ht 71.0 in | Wt 204.1 lb

## 2015-04-24 DIAGNOSIS — I4891 Unspecified atrial fibrillation: Secondary | ICD-10-CM | POA: Diagnosis not present

## 2015-04-24 DIAGNOSIS — Z79899 Other long term (current) drug therapy: Secondary | ICD-10-CM | POA: Diagnosis not present

## 2015-04-24 MED ORDER — RIVAROXABAN 20 MG PO TABS: 20.0000 mg | ORAL_TABLET | Freq: Every day | ORAL | Status: DC

## 2015-04-24 NOTE — Progress Notes (Signed)
Per Dr Harrington Challenger pt to start Xarelto 20 mg daily,cbc , bmet in 1 month ,pts wife aware as pt not home

## 2015-04-24 NOTE — Addendum Note (Signed)
Addended by: Barbarann Ehlers A on: 04/24/2015 04:43 PM   Modules accepted: Orders

## 2015-04-24 NOTE — Progress Notes (Signed)
Cardiology Office Note   Date:  04/24/2015   ID:  Brent, Wilkins [birth year], MRN [id number]  PCP:  Glo Herring., MD  Cardiologist:   Dorris Carnes, MD   No chief complaint on file.     History of Present Illness: Brent Wilkins is a 74 y.o. male with a history of syncope in the past with positive tilt table in 2003.  Als    Patient is a 74 yo who was previously followed by Corky Downs He has a history of syncope with positive tilt table in 2003, He has been treated wi th sertraline and b blocker. 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  He was last seen by Corky Downs in APril 2014 He cut back on propranolol to 20 bid Added CoQ 10 for cramps.  Normal lexiscan myoview in 2012 Normal echo in 2012. Mild MR. Mild TR.   Patient's syncopal spells only happened in church Gets about 1 min warning Warm, sweats, yawn Out. Treated for years  Knocking him down.  Stopped those meds.  I saw him in October 2015  At that time due to intermitt dizziness recomm splitting amlodipine in 1/2 and holding pm if dizzy   He had eye surgery this summer   EKG in August showed atrial fib which was new.   He relayed this info to Dr Beckie Salts (who takes care of his wife)  Repeat EKG was done which showed afib persistent.  Appt was made to see me  Since seen he denes palpitations  No syncope  Occasional dizzienss with standing quick He has noticed fatigue, lack of energy  He has attrib to his thyroid  Followedd by Dr Gerarda Fraction  Thyroid function noted to be off  Dosing adjusted a few wks ago  No CP  No PND        Current Outpatient Prescriptions  Medication Sig Dispense Refill  . acetaminophen (TYLENOL) 325 MG tablet Take 2 tablets (650 mg total) by mouth every 6 (six) hours as needed for headache or mild pain.    Marland Kitchen amLODipine (NORVASC) 5 MG tablet Take 1 tablet (5 mg total) by mouth 2 (two) times daily. 180 tablet 3  . aspirin EC 81 MG tablet Take 81 mg by mouth daily.     . finasteride (PROSCAR) 5 MG tablet Take 5 mg by mouth daily.    . fluticasone (FLONASE) 50 MCG/ACT nasal spray Place 1 spray into both nostrils daily as needed for allergies.     Marland Kitchen gabapentin (NEURONTIN) 300 MG capsule Take 300 mg by mouth 2 (two) times daily.     Marland Kitchen levothyroxine (SYNTHROID, LEVOTHROID) 75 MCG tablet Take 75 mcg by mouth daily.  0  . Multiple Vitamin (MULITIVITAMIN WITH MINERALS) TABS Take 1 tablet by mouth daily.    . Omega-3 Fatty Acids (RA FISH OIL) 1400 MG CPDR Take 1,400 mg by mouth daily.    Marland Kitchen omeprazole (PRILOSEC) 20 MG capsule Take 20 mg by mouth at bedtime.    . Potassium 99 MG TABS Take 99 mg by mouth daily as needed (TAKES IN SUMMER TIME ONLY).    Marland Kitchen propranolol (INDERAL) 10 MG tablet Take 1 tablet (10 mg total) by mouth 2 (two) times daily as needed. 180 tablet 2  . Tamsulosin HCl (FLOMAX) 0.4 MG CAPS Take 0.4 mg by mouth daily after breakfast.     . vitamin B-12 (CYANOCOBALAMIN) 500 MCG tablet Take 500 mcg by mouth daily.  No current facility-administered medications for this visit.    Allergies:   Oxycodone; Statins; Tetanus toxoids; Penicillins; and Sulfa antibiotics   Past Medical History  Diagnosis Date  . HTN (hypertension)   . Seasonal allergies   . Neuropathy     Bilateral ankles  . Hypothyroidism   . Dysphagia   . Syncope  dx. 8 yrs ago  . GERD (gastroesophageal reflux disease)   . History of kidney stones   . Arthritis   . Frequent urination at night   . 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 Prostate cancer 2010    Dr. Rosana Hoes    Past Surgical History  Procedure Laterality Date  . Tonsillectomy    . Appendectomy    . Colonoscopy  07/23/10    Dr. Vivi Ferns rectum, scattered pancolonic diverticula  . Colonoscopy  08/10/1999    internal hemorrhoids,inflammatory polyp  . Cholecystectomy    . Esophagogastroduodenoscopy  04/12/08    Prominent Schatzki's ring, erosive reflux esophagitis, multiple antral erosions, small hiatal hernia,  reactive gastropathy, status post dilation with 32F  . Esophagogastroduodenoscopy  10/28/2011    Procedure: ESOPHAGOGASTRODUODENOSCOPY (EGD);  Surgeon: Daneil Dolin, MD;  [facility];  Service: Endoscopy;  Laterality: N/A;  1:30  . Savory dilation  10/28/2011    Procedure: SAVORY DILATION;  Surgeon: Daneil Dolin, MD;  [facility];  Service: Endoscopy;  Laterality: N/A;  Venia Minks dilation  10/28/2011    Procedure: Venia Minks DILATION;  Surgeon: Daneil Dolin, MD;  [facility];  Service: Endoscopy;  Laterality: N/A;  . Total ankle arthroplasty Left 11/25/2013    Procedure: TOTAL ANKLE ARTHOPLASTY;  Surgeon: Wylene Simmer, MD;  Location: [facility];  Service: Orthopedics;  Laterality: Left;  . Total ankle arthroplasty Right 07/14/2014    dr hewitt  . Total ankle arthroplasty Right 07/14/2014    Procedure: RIGHT TOTAL ANKLE ARTHOPLASTY;  Surgeon: Wylene Simmer, MD;  Location: [facility];  Service: Orthopedics;  Laterality: Right;  . Cataract extraction w/phaco Left 03/27/2015    Procedure: CATARACT EXTRACTION PHACO AND INTRAOCULAR LENS PLACEMENT LEFT EYE CDE=11.73;  Surgeon: Tonny Branch, MD;  [facility];  Service: Ophthalmology;  Laterality: Left;  . Cataract extraction w/phaco Right 03/30/2015    Procedure: CATARACT EXTRACTION PHACO AND INTRAOCULAR LENS PLACEMENT RIGHT EYE CDE=7.35;  Surgeon: Tonny Branch, MD;  [facility];  Service: Ophthalmology;  Laterality: Right;     Social History:  The patient  reports that he has never smoked. He has never used smokeless tobacco. He reports that he drinks about 0.6 - 1.2 oz of alcohol per week. He reports that he does not use illicit drugs.   Family History:  The patient's family history includes Prostate cancer in his brother and father; Prostate cancer (age of onset: 10) in his son.    ROS:  Please see the history of present illness. All other systems are reviewed and  Negative to the above problem except as noted.     PHYSICAL EXAM: VS:  There were no vitals taken for this visit.  GEN: Well nourished, well developed, in no acute distress HEENT: normal Neck: no JVD, carotid bruits, or masses Cardiac: Irreg irreg  RR; no murmurs, rubs, or gallops,no edema  Respiratory:  clear to auscultation bilaterally, normal work of breathing GI: soft, nontender, nondistended, + BS  No hepatomegaly  MS: no deformity Moving all extremities   Skin: warm and dry, no rash Neuro:  Strength and sensation are intact Psych: euthymic mood, full affect   EKG:  EKG is not ordered today.    Atrial fib 62  bpm  RBBB   Lipid Panel No results found for: CHOL, TRIG, HDL, CHOLHDL, VLDL, LDLCALC, LDLDIRECT    Wt Readings from Last 3 Encounters:  03/27/15 203 lb (92.08 kg)  03/22/15 203 lb (92.08 kg)  07/14/14 199 lb 1.6 oz (90.311 kg)      ASSESSMENT AND PLAN: 1  Atrial fib  May be related to thyroid dysfunction  May not Has some fatigue   I  Am not completely convinced related to afib  His  CHADSVASC score of  3  He should be on an anticoagulation I woiuld recomm stopping ASA  I will prob recomm a NOAC once I see the labs he had done recently  Would get echo  I would also set up for TSH  2  HTN  Controlled  3.  Syncpe  Denies      Signed, Dorris Carnes, MD  04/24/2015 12:59 PM    [facility] [address], [place], [place]  [address] Phone: [phone]; Fax: [phone]

## 2015-04-24 NOTE — Patient Instructions (Signed)
Your physician recommends that you schedule a follow-up appointment in: TO BE DETERMINED BASED ON TESTS  STOP ASPIRIN  Your physician recommends that you return for lab work in: [place]- TSH  Your physician has requested that you have an echocardiogram. Echocardiography is a painless test that uses sound waves to create images of your heart. It provides your doctor with information about the size and shape of your heart and how well your heart's chambers and valves are working. This procedure takes approximately one hour. There are no restrictions for this procedure.  I will request lab work from Dr. Gerarda Fraction  Thanks for choosing [facility]!!!

## 2015-04-25 LAB — TSH: TSH: 1.878 u[IU]/mL (ref 0.350–4.500)

## 2015-04-26 ENCOUNTER — Ambulatory Visit (HOSPITAL_COMMUNITY)
Admission: RE | Admit: 2015-04-26 | Discharge: 2015-04-26 | Disposition: A | Discharge status: Home / Self Care | Payer: Medicare Other | Source: Ambulatory Visit | Attending: Internal Medicine | Admitting: Internal Medicine

## 2015-04-26 DIAGNOSIS — I4891 Unspecified atrial fibrillation: Secondary | ICD-10-CM | POA: Diagnosis not present

## 2015-04-27 ENCOUNTER — Other Ambulatory Visit: Payer: Self-pay | Admitting: Internal Medicine

## 2015-04-27 NOTE — Telephone Encounter (Signed)
Brent Wilkins left a message saying that [facility] called him and told him that they cannot complete his prescription for Xarelto. The patient requested that we call him back regarding this.

## 2015-04-27 NOTE — Telephone Encounter (Signed)
I explained to pt that his insurance would send [place] a prior authorization request BEFORE they fill the drug.We have not yet received Prior Auth request.pateints line just rings

## 2015-05-22 DIAGNOSIS — I4891 Unspecified atrial fibrillation: Secondary | ICD-10-CM | POA: Diagnosis not present

## 2015-05-23 DIAGNOSIS — Z1283 Encounter for screening for malignant neoplasm of skin: Secondary | ICD-10-CM | POA: Diagnosis not present

## 2015-05-23 DIAGNOSIS — X32XXXD Exposure to sunlight, subsequent encounter: Secondary | ICD-10-CM | POA: Diagnosis not present

## 2015-05-23 DIAGNOSIS — L570 Actinic keratosis: Secondary | ICD-10-CM | POA: Diagnosis not present

## 2015-05-23 DIAGNOSIS — C44310 Basal cell carcinoma of skin of unspecified parts of face: Secondary | ICD-10-CM | POA: Diagnosis not present

## 2015-05-23 DIAGNOSIS — C44519 Basal cell carcinoma of skin of other part of trunk: Secondary | ICD-10-CM | POA: Diagnosis not present

## 2015-05-23 LAB — CBC
HCT: 45.2 % (ref 39.0–52.0)
Hemoglobin: 15.1 g/dL (ref 13.0–17.0)
MCH: 32 pg (ref 26.0–34.0)
MCHC: 33.4 g/dL (ref 30.0–36.0)
MCV: 95.8 fL (ref 78.0–100.0)
MPV: 9.6 fL (ref 8.6–12.4)
PLATELETS: 241 10*3/uL (ref 150–400)
RBC: 4.72 MIL/uL (ref 4.22–5.81)
RDW: 14.8 % (ref 11.5–15.5)
WBC: 6.6 10*3/uL (ref 4.0–10.5)

## 2015-05-23 LAB — BASIC METABOLIC PANEL
BUN: 15 mg/dL (ref 7–25)
CHLORIDE: 107 mmol/L (ref 98–110)
CO2: 27 mmol/L (ref 20–31)
Calcium: 9.1 mg/dL (ref 8.6–10.3)
Creat: 0.99 mg/dL (ref 0.70–1.18)
Glucose, Bld: 111 mg/dL — ABNORMAL HIGH (ref 65–99)
POTASSIUM: 4.3 mmol/L (ref 3.5–5.3)
SODIUM: 142 mmol/L (ref 135–146)

## 2015-06-05 ENCOUNTER — Ambulatory Visit: Payer: Medicare Other | Admitting: Internal Medicine

## 2015-06-05 ENCOUNTER — Ambulatory Visit (INDEPENDENT_AMBULATORY_CARE_PROVIDER_SITE_OTHER): Payer: Medicare Other | Admitting: Adult Health

## 2015-06-05 ENCOUNTER — Encounter: Payer: Self-pay | Admitting: Adult Health

## 2015-06-05 ENCOUNTER — Telehealth: Payer: Self-pay | Admitting: Internal Medicine

## 2015-06-05 VITALS — BP 118/64 | HR 73 | Ht 70.5 in | Wt 202.0 lb

## 2015-06-05 DIAGNOSIS — I10 Essential (primary) hypertension: Secondary | ICD-10-CM | POA: Diagnosis not present

## 2015-06-05 DIAGNOSIS — I480 Paroxysmal atrial fibrillation: Secondary | ICD-10-CM | POA: Diagnosis not present

## 2015-06-05 DIAGNOSIS — R42 Dizziness and giddiness: Secondary | ICD-10-CM | POA: Diagnosis not present

## 2015-06-05 DIAGNOSIS — I4891 Unspecified atrial fibrillation: Secondary | ICD-10-CM | POA: Insufficient documentation

## 2015-06-05 MED ORDER — APIXABAN 5 MG PO TABS: 5.0000 mg | ORAL_TABLET | Freq: Two times a day (BID) | ORAL | Status: DC

## 2015-06-05 MED ORDER — AMLODIPINE BESYLATE 5 MG PO TABS: 5.0000 mg | ORAL_TABLET | Freq: Every day | ORAL | Status: DC

## 2015-06-05 NOTE — Telephone Encounter (Signed)
Will forward to Dr. Ross.  

## 2015-06-05 NOTE — Patient Instructions (Signed)
Your physician recommends that you schedule a follow-up appointment in: 2 weeks with K.Lawrence NP  DECREASE Amlodipine to daily  STOP XARELTO  We have given you samples of Eliquis 5 mg twice a day. Start in the morning   Keep daily blood pressure log and bring to follow up apt with you.   If you need a refill on your cardiac medications before your next appointment, please call your pharmacy.    Thank you for choosing [facility] !

## 2015-06-05 NOTE — Progress Notes (Signed)
Cardiology Office Note   Date:  06/05/2015   ID:  Caven, Perine 05/08/1941, MRN [id number]  PCP:  Glo Herring., MD  Cardiologist:  Ross/ Jory Sims, NP   Chief Complaint  Patient presents with  . Rash  . Dizziness      History of Present Illness: Brent Wilkins is a 74 y.o. male who presents for ongoing assessment and management of atrial fib and hypertension. He was placed on Xarelto 20 mg daily by Dr. Harrington Challenger about 4 weeks ago. He has started red macular rash on his back and arms and right leg. He has had associated nausea and nose bleeds. He is also having dizziness. He has had a positive tilt table test and has been placed on propanolol. He is also on amlodipine 5 mg BID.    Most recent echocardiogram 04/26/2015 Left ventricle: The cavity size was normal. Wall thickness was increased increased in a pattern of mild to moderate LVH. Systolic function was normal. The estimated ejection fraction was in the range of 60% to 65%. Wall motion was normal; there were no regional wall motion abnormalities. - Aortic valve: Valve area (VTI): 2.18 cm^2. Valve area (Vmax): 2.13 cm^2. - Mitral valve: There was mild regurgitation. - Left atrium: The atrium was severely dilated. - Right ventricle: The cavity size was mildly dilated. - Right atrium: The atrium was severely dilated. - Atrial septum: No defect or patent foramen ovale was identified. - Pulmonary arteries: Systolic pressure was mildly increased. PA peak pressure: 34 mm Hg (S).   Past Medical History  Diagnosis Date  . HTN (hypertension)   . Seasonal allergies   . Neuropathy (HCC)     Bilateral ankles  . Hypothyroidism   . Dysphagia   . Syncope  dx. 8 yrs ago  . GERD (gastroesophageal reflux disease)   . History of kidney stones   . Arthritis   . Frequent urination at night   . 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 Prostate cancer [facility]) 2010    Dr. Rosana Hoes    Past Surgical History  Procedure  Laterality Date  . Tonsillectomy    . Appendectomy    . Colonoscopy  07/23/10    Dr. Vivi Ferns rectum, scattered pancolonic diverticula  . Colonoscopy  08/10/1999    internal hemorrhoids,inflammatory polyp  . Cholecystectomy    . Esophagogastroduodenoscopy  04/12/08    Prominent Schatzki's ring, erosive reflux esophagitis, multiple antral erosions, small hiatal hernia, reactive gastropathy, status post dilation with 23F  . Esophagogastroduodenoscopy  10/28/2011    Procedure: ESOPHAGOGASTRODUODENOSCOPY (EGD);  Surgeon: Daneil Dolin, MD;  [facility];  Service: Endoscopy;  Laterality: N/A;  1:30  . Savory dilation  10/28/2011    Procedure: SAVORY DILATION;  Surgeon: Daneil Dolin, MD;  [facility];  Service: Endoscopy;  Laterality: N/A;  Venia Minks dilation  10/28/2011    Procedure: Venia Minks DILATION;  Surgeon: Daneil Dolin, MD;  [facility];  Service: Endoscopy;  Laterality: N/A;  . Total ankle arthroplasty Left 11/25/2013    Procedure: TOTAL ANKLE ARTHOPLASTY;  Surgeon: Wylene Simmer, MD;  Location: [facility];  Service: Orthopedics;  Laterality: Left;  . Total ankle arthroplasty Right 07/14/2014    dr hewitt  . Total ankle arthroplasty Right 07/14/2014    Procedure: RIGHT TOTAL ANKLE ARTHOPLASTY;  Surgeon: Wylene Simmer, MD;  Location: [facility];  Service: Orthopedics;  Laterality: Right;  . Cataract extraction w/phaco Left 03/27/2015  Procedure: CATARACT EXTRACTION PHACO AND INTRAOCULAR LENS PLACEMENT LEFT EYE CDE=11.73;  Surgeon: Tonny Branch, MD;  [facility];  Service: Ophthalmology;  Laterality: Left;  . Cataract extraction w/phaco Right 03/30/2015    Procedure: CATARACT EXTRACTION PHACO AND INTRAOCULAR LENS PLACEMENT RIGHT EYE CDE=7.35;  Surgeon: Tonny Branch, MD;  [facility];  Service: Ophthalmology;  Laterality: Right;     Current Outpatient Prescriptions  Medication Sig Dispense Refill  . amLODipine (NORVASC) 5 MG tablet Take 1 tablet (5 mg total)  by mouth daily. 90 tablet 3  . finasteride (PROSCAR) 5 MG tablet Take 5 mg by mouth daily.    . fluticasone (FLONASE) 50 MCG/ACT nasal spray Place 1 spray into both nostrils daily as needed for allergies.     Marland Kitchen levothyroxine (SYNTHROID, LEVOTHROID) 75 MCG tablet Take 75 mcg by mouth daily.  0  . Multiple Vitamin (MULITIVITAMIN WITH MINERALS) TABS Take 1 tablet by mouth daily.    . Omega-3 Fatty Acids (RA FISH OIL) 1400 MG CPDR Take 1,400 mg by mouth daily.    Marland Kitchen omeprazole (PRILOSEC) 20 MG capsule Take 20 mg by mouth at bedtime.    . propranolol (INDERAL) 10 MG tablet Take 1 tablet (10 mg total) by mouth 2 (two) times daily as needed. 180 tablet 2  . Tamsulosin HCl (FLOMAX) 0.4 MG CAPS Take 0.4 mg by mouth daily after breakfast.     . vitamin B-12 (CYANOCOBALAMIN) 500 MCG tablet Take 500 mcg by mouth daily.    Marland Kitchen apixaban (ELIQUIS) 5 MG TABS tablet Take 1 tablet (5 mg total) by mouth 2 (two) times daily. 60 tablet 6   No current facility-administered medications for this visit.    Allergies:   Oxycodone; Statins; Tetanus toxoids; Penicillins; and Sulfa antibiotics    Social History:  The patient  reports that he has never smoked. He has never used smokeless tobacco. He reports that he drinks about 0.6 - 1.2 oz of alcohol per week. He reports that he does not use illicit drugs.   Family History:  The patient's family history includes Prostate cancer in his brother and father; Prostate cancer (age of onset: 19) in his son.    ROS: All other systems are reviewed and negative. Unless otherwise mentioned in H&P    PHYSICAL EXAM: VS:  BP 118/64 mmHg  Pulse 73  Ht 5' 10.5" (1.791 m)  Wt 202 lb (91.627 kg)  BMI 28.56 kg/m2  SpO2 97% , BMI Body mass index is 28.56 kg/(m^2). GEN: Well nourished, well developed, in no acute distress HEENT: normal Neck: no JVD, carotid bruits, or masses Cardiac: RRR; no murmurs, rubs, or gallops,no edema  Respiratory:  clear to auscultation bilaterally, normal  work of breathing GI: soft, nontender, nondistended, + BS MS: no deformity or atrophy Skin: warm and dry, rash on back arms and right leg, red macular.  Neuro:  Strength and sensation are intact Psych: euthymic mood, full affect  Recent Labs: 04/24/2015: TSH 1.878 05/22/2015: BUN 15; Creat 0.99; Hemoglobin 15.1; Platelets 241; Potassium 4.3; Sodium 142    Lipid Panel No results found for: CHOL, TRIG, HDL, CHOLHDL, VLDL, LDLCALC, LDLDIRECT    Wt Readings from Last 3 Encounters:  06/05/15 202 lb (91.627 kg)  04/24/15 204 lb 1.6 oz (92.579 kg)  03/27/15 203 lb (92.08 kg)       ASSESSMENT AND PLAN:  1.  Atrial fib: Heart rate is controlled on propanolol. I think the Xarelto may have caused the rash as it began  after beginning this and has progressed. I will stop the Xarelto. He will start Eliquis 5 mg BID, I have given him samples. He will not take Xarelto tonight and begin Eliquis in the am.   2. Hypertension: I think he is on too high a dose of amlodipine. I will cut it down to once a day in the am. He is slightly hypotensive. I have checked his labs and there is no evidence of anemia or dehydration. He is given a BP recording sheet with parameters for BP of 79-480 systolic, as recent studies suggest that higher BP in older adults is much better tolerated. Will see him back in 2 weeks. He is to call for any problems.   3. Dizziness: He is also on Proscar and Flomax which can be contributing to dizziness. Will see how he does on lower dose of amlodipine.    Current medicines are reviewed at length with the patient today.    Labs/ tests ordered today include: None No orders of the defined types were placed in this encounter.     Disposition:   FU with cardiology in 2 weeks.  Signed, Jory Sims, NP  06/05/2015 4:27 PM    [facility] [address] [place], [place] [address] Phone: [phone]; Fax: [phone]

## 2015-06-05 NOTE — Progress Notes (Deleted)
Name: Brent Wilkins    DOB: 14-Jan-1941  Age: 74 y.o.  MR#: [id number]       PCP:  Brent Herring., MD      Insurance: Payor: [employer] MEDICARE / Plan: [facility] MEDICARE / Product Type: *No Product type* /   CC:   No chief complaint on file.   VS Filed Vitals:   06/05/15 1458  BP: 118/64  Pulse: 73  Height: 5' 10.5" (1.791 m)  Weight: 202 lb (91.627 kg)  SpO2: 97%    Weights Current Weight  06/05/15 202 lb (91.627 kg)  04/24/15 204 lb 1.6 oz (92.579 kg)  03/27/15 203 lb (92.08 kg)    Blood Pressure  BP Readings from Last 3 Encounters:  06/05/15 118/64  04/24/15 122/78  03/30/15 146/92     Admit date:  (Not on file) Last encounter with RMR:  Visit date not found   Allergy Oxycodone; Statins; Tetanus toxoids; Penicillins; and Sulfa antibiotics  Current Outpatient Prescriptions  Medication Sig Dispense Refill  . amLODipine (NORVASC) 5 MG tablet Take 1 tablet (5 mg total) by mouth 2 (two) times daily. 180 tablet 3  . finasteride (PROSCAR) 5 MG tablet Take 5 mg by mouth daily.    . fluticasone (FLONASE) 50 MCG/ACT nasal spray Place 1 spray into both nostrils daily as needed for allergies.     Marland Kitchen levothyroxine (SYNTHROID, LEVOTHROID) 75 MCG tablet Take 75 mcg by mouth daily.  0  . Multiple Vitamin (MULITIVITAMIN WITH MINERALS) TABS Take 1 tablet by mouth daily.    . Omega-3 Fatty Acids (RA FISH OIL) 1400 MG CPDR Take 1,400 mg by mouth daily.    Marland Kitchen omeprazole (PRILOSEC) 20 MG capsule Take 20 mg by mouth at bedtime.    . propranolol (INDERAL) 10 MG tablet Take 1 tablet (10 mg total) by mouth 2 (two) times daily as needed. 180 tablet 2  . rivaroxaban (XARELTO) 20 MG TABS tablet Take 1 tablet (20 mg total) by mouth daily with supper. 30 tablet 6  . Tamsulosin HCl (FLOMAX) 0.4 MG CAPS Take 0.4 mg by mouth daily after breakfast.     . vitamin B-12 (CYANOCOBALAMIN) 500 MCG tablet Take 500 mcg by mouth daily.     No current facility-administered medications for this visit.     Discontinued Meds:    Medications Discontinued During This Encounter  Medication Reason  . rivaroxaban (XARELTO) 20 MG TABS tablet Error  . gabapentin (NEURONTIN) 300 MG capsule Error    Patient Active Problem List   Diagnosis Date Noted  . 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 Arthritis of left ankle 11/25/2013  . Dysphagia 10/22/2011  . GERD (gastroesophageal reflux disease) 10/22/2011  . Schatzki's ring 10/22/2011    LABS    Component Value Date/Time   NA 142 05/22/2015 1056   NA 140 03/22/2015 1500   NA 143 07/06/2014 1032   K 4.3 05/22/2015 1056   K 4.7 03/22/2015 1500   K 4.3 07/06/2014 1032   CL 107 05/22/2015 1056   CL 106 03/22/2015 1500   CL 105 07/06/2014 1032   CO2 27 05/22/2015 1056   CO2 26 03/22/2015 1500   CO2 26 07/06/2014 1032   GLUCOSE 111* 05/22/2015 1056   GLUCOSE 92 03/22/2015 1500   GLUCOSE 90 07/06/2014 1032   BUN 15 05/22/2015 1056   BUN 23* 03/22/2015 1500   BUN 19 07/06/2014 1032   CREATININE 0.99 05/22/2015 1056   CREATININE 1.20 03/22/2015 1500   CREATININE 1.06  07/06/2014 1032   CREATININE 1.07 11/15/2013 1052   CALCIUM 9.1 05/22/2015 1056   CALCIUM 9.3 03/22/2015 1500   CALCIUM 9.3 07/06/2014 1032   GFRNONAA 57* 03/22/2015 1500   GFRNONAA 67* 07/06/2014 1032   GFRNONAA 66* 11/15/2013 1052   GFRAA >60 03/22/2015 1500   GFRAA 77* 07/06/2014 1032   GFRAA 77* 11/15/2013 1052   CMP     Component Value Date/Time   NA 142 05/22/2015 1056   K 4.3 05/22/2015 1056   CL 107 05/22/2015 1056   CO2 27 05/22/2015 1056   GLUCOSE 111* 05/22/2015 1056   BUN 15 05/22/2015 1056   CREATININE 0.99 05/22/2015 1056   CREATININE 1.20 03/22/2015 1500   CALCIUM 9.1 05/22/2015 1056   GFRNONAA 57* 03/22/2015 1500   GFRAA >60 03/22/2015 1500       Component Value Date/Time   WBC 6.6 05/22/2015 1056   WBC 8.0 03/22/2015 1500   WBC 6.4 07/06/2014 1032   HGB 15.1 05/22/2015 1056   HGB 15.1 03/22/2015 1500   HGB 15.0 07/06/2014  1032   HCT 45.2 05/22/2015 1056   HCT 43.8 03/22/2015 1500   HCT 59.2* 07/06/2014 1032   MCV 95.8 05/22/2015 1056   MCV 92.2 03/22/2015 1500   MCV 125.2* 07/06/2014 1032    Lipid Panel  No results found for: CHOL, TRIG, HDL, CHOLHDL, VLDL, LDLCALC, LDLDIRECT  ABG No results found for: PHART, PCO2ART, PO2ART, HCO3, TCO2, ACIDBASEDEF, O2SAT   Lab Results  Component Value Date   TSH 1.878 04/24/2015   BNP (last 3 results) No results for input(s): BNP in the last 8760 hours.  ProBNP (last 3 results) No results for input(s): PROBNP in the last 8760 hours.  Cardiac Panel (last 3 results) No results for input(s): CKTOTAL, CKMB, TROPONINI, RELINDX in the last 72 hours.  Iron/TIBC/Ferritin/ %Sat No results found for: IRON, TIBC, FERRITIN, IRONPCTSAT   EKG Orders placed or performed in visit on 04/17/15  . EKG 12-Lead     Prior Assessment and Plan Problem List as of 06/05/2015      Digestive   Dysphagia   Last Assessment & Plan 10/22/2011 Office Visit Written 10/22/2011  1:39 PM by Brent Meuse, NP    Brent Wilkins is a pleasant 74 y.o. male history of erosive reflux esophagitis, chronic gastritis, Schatzki's ring presents with recurrent dysphagia.  He is on Relafen twice daily. He previously had gastritis, most likely NSAID-induced.  Unfortunately, he has not been compliant with daily PPI.  EGD with Dr. Gala Romney with possible esophageal dilation.  I have discussed risks & benefits which include, but are not limited to, bleeding, infection, perforation & drug reaction.  The patient agrees with this plan & written consent will be obtained.   Resume omeprazole 20 mg daily        GERD (gastroesophageal reflux disease)   Last Assessment & Plan 10/22/2011 Office Visit Written 10/22/2011  1:40 PM by Brent Meuse, NP    Occasional heartburn. Taking when necessary TUMS. Advised to resume PPI with history of complicated GERD, Schatzki's ring and gastritis, as well as protection from  chronic NSAIDs.      Schatzki's ring   Last Assessment & Plan 10/22/2011 Office Visit Written 10/22/2011  1:40 PM by Brent Meuse, NP    see dysphagia        Musculoskeletal and Integument   Arthritis of left ankle   Post-traumatic arthritis of ankle       Imaging: No results  found.

## 2015-06-05 NOTE — Telephone Encounter (Signed)
Yes he should have f/u  Would set appt for next may, June 2017 Was seen in [place]  Not sure if / when I will be there  Can be seen by me in [place] or f/u with [facility] or [employer]

## 2015-06-05 NOTE — Telephone Encounter (Signed)
Patient is asking when he needs to f/u. No f/u on LOS. / tg

## 2015-06-14 ENCOUNTER — Other Ambulatory Visit: Payer: Self-pay | Admitting: Internal Medicine

## 2015-06-19 ENCOUNTER — Ambulatory Visit (INDEPENDENT_AMBULATORY_CARE_PROVIDER_SITE_OTHER): Payer: Medicare Other | Admitting: Adult Health

## 2015-06-19 ENCOUNTER — Encounter: Payer: Self-pay | Admitting: Adult Health

## 2015-06-19 VITALS — BP 150/80 | HR 77 | Ht 71.0 in | Wt 205.0 lb

## 2015-06-19 DIAGNOSIS — I10 Essential (primary) hypertension: Secondary | ICD-10-CM | POA: Diagnosis not present

## 2015-06-19 DIAGNOSIS — I4819 Other persistent atrial fibrillation: Secondary | ICD-10-CM

## 2015-06-19 DIAGNOSIS — I481 Persistent atrial fibrillation: Secondary | ICD-10-CM | POA: Diagnosis not present

## 2015-06-19 MED ORDER — APIXABAN 5 MG PO TABS: 5.0000 mg | ORAL_TABLET | Freq: Two times a day (BID) | ORAL | Status: DC

## 2015-06-19 NOTE — Progress Notes (Signed)
Name: Brent Wilkins    DOB: 03-16-1941  Age: 74 y.o.  MR#: [id number]       PCP:  Glo Herring., MD      Insurance: Payor: [employer] MEDICARE / Plan: [facility] MEDICARE / Product Type: *No Product type* /   CC:    Chief Complaint  Patient presents with  . Atrial Fibrillation  . Hypertension    VS Filed Vitals:   06/19/15 1359  BP: 150/80  Pulse: 77  Height: 5\' 11"  (1.803 m)  Weight: 205 lb (92.987 kg)  SpO2: 96%    Weights Current Weight  06/19/15 205 lb (92.987 kg)  06/05/15 202 lb (91.627 kg)  04/24/15 204 lb 1.6 oz (92.579 kg)    Blood Pressure  BP Readings from Last 3 Encounters:  06/19/15 150/80  06/05/15 118/64  04/24/15 122/78     Admit date:  (Not on file) Last encounter with RMR:  06/05/2015   Allergy Oxycodone; Statins; Tetanus toxoids; Penicillins; and Sulfa antibiotics  Current Outpatient Prescriptions  Medication Sig Dispense Refill  . amLODipine (NORVASC) 5 MG tablet Take 1 tablet (5 mg total) by mouth daily. 90 tablet 3  . apixaban (ELIQUIS) 5 MG TABS tablet Take 1 tablet (5 mg total) by mouth 2 (two) times daily. 60 tablet 6  . finasteride (PROSCAR) 5 MG tablet Take 5 mg by mouth daily.    . fluticasone (FLONASE) 50 MCG/ACT nasal spray Place 1 spray into both nostrils daily as needed for allergies.     Marland Kitchen gabapentin (NEURONTIN) 300 MG capsule     . levothyroxine (SYNTHROID, LEVOTHROID) 75 MCG tablet Take 75 mcg by mouth daily.  0  . Multiple Vitamin (MULITIVITAMIN WITH MINERALS) TABS Take 1 tablet by mouth daily.    . Omega-3 Fatty Acids (RA FISH OIL) 1400 MG CPDR Take 1,400 mg by mouth daily.    Marland Kitchen omeprazole (PRILOSEC) 20 MG capsule Take 20 mg by mouth at bedtime.    . propranolol (INDERAL) 10 MG tablet Take 1 tablet by mouth two  times daily as needed 180 tablet 2  . Tamsulosin HCl (FLOMAX) 0.4 MG CAPS Take 0.4 mg by mouth daily after breakfast.     . vitamin B-12 (CYANOCOBALAMIN) 500 MCG tablet Take 500 mcg by mouth daily.     No current  facility-administered medications for this visit.    Discontinued Meds:   There are no discontinued medications.  Patient Active Problem List   Diagnosis Date Noted  . Atrial fibrillation ([place]) 06/05/2015  . Essential hypertension 06/05/2015  . 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 Arthritis of left ankle 11/25/2013  . Dysphagia 10/22/2011  . GERD (gastroesophageal reflux disease) 10/22/2011  . Schatzki's ring 10/22/2011    LABS    Component Value Date/Time   NA 142 05/22/2015 1056   NA 140 03/22/2015 1500   NA 143 07/06/2014 1032   K 4.3 05/22/2015 1056   K 4.7 03/22/2015 1500   K 4.3 07/06/2014 1032   CL 107 05/22/2015 1056   CL 106 03/22/2015 1500   CL 105 07/06/2014 1032   CO2 27 05/22/2015 1056   CO2 26 03/22/2015 1500   CO2 26 07/06/2014 1032   GLUCOSE 111* 05/22/2015 1056   GLUCOSE 92 03/22/2015 1500   GLUCOSE 90 07/06/2014 1032   BUN 15 05/22/2015 1056   BUN 23* 03/22/2015 1500   BUN 19 07/06/2014 1032   CREATININE 0.99 05/22/2015 1056   CREATININE 1.20 03/22/2015 1500   CREATININE 1.06  07/06/2014 1032   CREATININE 1.07 11/15/2013 1052   CALCIUM 9.1 05/22/2015 1056   CALCIUM 9.3 03/22/2015 1500   CALCIUM 9.3 07/06/2014 1032   GFRNONAA 57* 03/22/2015 1500   GFRNONAA 67* 07/06/2014 1032   GFRNONAA 66* 11/15/2013 1052   GFRAA >60 03/22/2015 1500   GFRAA 77* 07/06/2014 1032   GFRAA 77* 11/15/2013 1052   CMP     Component Value Date/Time   NA 142 05/22/2015 1056   K 4.3 05/22/2015 1056   CL 107 05/22/2015 1056   CO2 27 05/22/2015 1056   GLUCOSE 111* 05/22/2015 1056   BUN 15 05/22/2015 1056   CREATININE 0.99 05/22/2015 1056   CREATININE 1.20 03/22/2015 1500   CALCIUM 9.1 05/22/2015 1056   GFRNONAA 57* 03/22/2015 1500   GFRAA >60 03/22/2015 1500       Component Value Date/Time   WBC 6.6 05/22/2015 1056   WBC 8.0 03/22/2015 1500   WBC 6.4 07/06/2014 1032   HGB 15.1 05/22/2015 1056   HGB 15.1 03/22/2015 1500   HGB 15.0 07/06/2014  1032   HCT 45.2 05/22/2015 1056   HCT 43.8 03/22/2015 1500   HCT 59.2* 07/06/2014 1032   MCV 95.8 05/22/2015 1056   MCV 92.2 03/22/2015 1500   MCV 125.2* 07/06/2014 1032    Lipid Panel  No results found for: CHOL, TRIG, HDL, CHOLHDL, VLDL, LDLCALC, LDLDIRECT  ABG No results found for: PHART, PCO2ART, PO2ART, HCO3, TCO2, ACIDBASEDEF, O2SAT   Lab Results  Component Value Date   TSH 1.878 04/24/2015   BNP (last 3 results) No results for input(s): BNP in the last 8760 hours.  ProBNP (last 3 results) No results for input(s): PROBNP in the last 8760 hours.  Cardiac Panel (last 3 results) No results for input(s): CKTOTAL, CKMB, TROPONINI, RELINDX in the last 72 hours.  Iron/TIBC/Ferritin/ %Sat No results found for: IRON, TIBC, FERRITIN, IRONPCTSAT   EKG Orders placed or performed in visit on 04/17/15  . EKG 12-Lead     Prior Assessment and Plan Problem List as of 06/19/2015      Cardiovascular and Mediastinum   Atrial fibrillation [facility])   Essential hypertension     Digestive   Dysphagia   Last Assessment & Plan 10/22/2011 Office Visit Written 10/22/2011  1:39 PM by Andria Meuse, NP    Elyn Aquas Fasig is a pleasant 74 y.o. male history of erosive reflux esophagitis, chronic gastritis, Schatzki's ring presents with recurrent dysphagia.  He is on Relafen twice daily. He previously had gastritis, most likely NSAID-induced.  Unfortunately, he has not been compliant with daily PPI.  EGD with Dr. Gala Romney with possible esophageal dilation.  I have discussed risks & benefits which include, but are not limited to, bleeding, infection, perforation & drug reaction.  The patient agrees with this plan & written consent will be obtained.   Resume omeprazole 20 mg daily        GERD (gastroesophageal reflux disease)   Last Assessment & Plan 10/22/2011 Office Visit Written 10/22/2011  1:40 PM by Andria Meuse, NP    Occasional heartburn. Taking when necessary TUMS. Advised to resume PPI  with history of complicated GERD, Schatzki's ring and gastritis, as well as protection from chronic NSAIDs.      Schatzki's ring   Last Assessment & Plan 10/22/2011 Office Visit Written 10/22/2011  1:40 PM by Andria Meuse, NP    see dysphagia        Musculoskeletal and Integument   Arthritis of left  ankle   Post-traumatic arthritis of ankle       Imaging: No results found.

## 2015-06-19 NOTE — Progress Notes (Signed)
Cardiology Office Note   Date:  06/19/2015   ID:  Rawn, Leverette 1940-11-27, MRN [id number]  PCP:  Glo Herring., MD  Cardiologist: Ross./  Jory Sims, NP   Chief Complaint  Patient presents with  . Atrial Fibrillation  . Hypertension      History of Present Illness: Brent Wilkins is a 74 y.o. male who presents for atrial fib and hypertension He has had a positive tilt table test and has been placed on propanolol. He is also on amlodipine 5 mg BID. He has a rash after beginning Xarelto and therefore we started him on Eliquis samples to see how he did on that. Due to dizziness, decreased amlodipine to 5 mg daily. He is here for follow up.   He feels much better with medication changes. He is no longer dizzy, nauseated or weak. The rash is completely resolved. His energy is better.   Past Medical History  Diagnosis Date  . HTN (hypertension)   . Seasonal allergies   . Neuropathy (H[facility])     Bilateral ankles  . Hypothyroidism   . Dysphagia   . Syncope  dx. 8 yrs ago  . GERD (gastroesophageal reflux disease)   . History of kidney stones   . Arthritis   . Frequent urination at night   . 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 Prostate cancer [facility]) 2010    Dr. Rosana Hoes    Past Surgical History  Procedure Laterality Date  . Tonsillectomy    . Appendectomy    . Colonoscopy  07/23/10    Dr. Vivi Ferns rectum, scattered pancolonic diverticula  . Colonoscopy  08/10/1999    internal hemorrhoids,inflammatory polyp  . Cholecystectomy    . Esophagogastroduodenoscopy  04/12/08    Prominent Schatzki's ring, erosive reflux esophagitis, multiple antral erosions, small hiatal hernia, reactive gastropathy, status post dilation with 45F  . Esophagogastroduodenoscopy  10/28/2011    Procedure: ESOPHAGOGASTRODUODENOSCOPY (EGD);  Surgeon: Daneil Dolin, MD;  [facility];  Service: Endoscopy;  Laterality: N/A;  1:30  . Savory dilation  10/28/2011    Procedure: SAVORY  DILATION;  Surgeon: Daneil Dolin, MD;  [facility];  Service: Endoscopy;  Laterality: N/A;  Venia Minks dilation  10/28/2011    Procedure: Venia Minks DILATION;  Surgeon: Daneil Dolin, MD;  [facility];  Service: Endoscopy;  Laterality: N/A;  . 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 Total ankle arthroplasty Right 07/14/2014    dr hewitt  . Total ankle arthroplasty Right 07/14/2014    Procedure: RIGHT TOTAL ANKLE ARTHOPLASTY;  Surgeon: Wylene Simmer, MD;  Location: [place];  Service: Orthopedics;  Laterality: Right;  . Cataract extraction w/phaco Left 03/27/2015    Procedure: CATARACT EXTRACTION PHACO AND INTRAOCULAR LENS PLACEMENT LEFT EYE CDE=11.73;  Surgeon: Tonny Branch, MD;  [facility];  Service: Ophthalmology;  Laterality: Left;  . Cataract extraction w/phaco Right 03/30/2015    Procedure: CATARACT EXTRACTION PHACO AND INTRAOCULAR LENS PLACEMENT RIGHT EYE CDE=7.35;  Surgeon: Tonny Branch, MD;  [facility];  Service: Ophthalmology;  Laterality: Right;     Current Outpatient Prescriptions  Medication Sig Dispense Refill  . amLODipine (NORVASC) 5 MG tablet Take 1 tablet (5 mg total) by mouth daily. 90 tablet 3  . apixaban (ELIQUIS) 5 MG TABS tablet Take 1 tablet (5 mg  total) by mouth 2 (two) times daily. 180 tablet 3  . finasteride (PROSCAR) 5 MG tablet Take 5 mg by mouth daily.    . fluticasone (FLONASE) 50 MCG/ACT nasal spray Place 1 spray into both nostrils daily as needed for allergies.     Marland Kitchen gabapentin (NEURONTIN) 300 MG capsule     . levothyroxine (SYNTHROID, LEVOTHROID) 75 MCG tablet Take 75 mcg by mouth daily.  0  . Multiple Vitamin (MULITIVITAMIN WITH MINERALS) TABS Take 1 tablet by mouth daily.    . Omega-3 Fatty Acids (RA FISH OIL) 1400 MG CPDR Take 1,400 mg by mouth daily.    Marland Kitchen omeprazole (PRILOSEC) 20 MG capsule Take 20 mg by  mouth at bedtime.    . propranolol (INDERAL) 10 MG tablet Take 1 tablet by mouth two  times daily as needed 180 tablet 2  . Tamsulosin HCl (FLOMAX) 0.4 MG CAPS Take 0.4 mg by mouth daily after breakfast.     . vitamin B-12 (CYANOCOBALAMIN) 500 MCG tablet Take 500 mcg by mouth daily.     No current facility-administered medications for this visit.    Allergies:   Oxycodone; Statins; Tetanus toxoids; Penicillins; and Sulfa antibiotics    Social History:  The patient  reports that he has never smoked. He has never used smokeless tobacco. He reports that he drinks about 0.6 - 1.2 oz of alcohol per week. He reports that he does not use illicit drugs.   Family History:  The patient's family history includes Prostate cancer in his brother and father; Prostate cancer (age of onset: 17) in his son.    ROS: All other systems are reviewed and negative. Unless otherwise mentioned in H&P    PHYSICAL EXAM: VS:  BP 150/80 mmHg  Pulse 77  Ht 5\' 11"  (1.803 m)  Wt 205 lb (92.987 kg)  BMI 28.60 kg/m2  SpO2 96% , BMI Body mass index is 28.6 kg/(m^2). GEN: Well nourished, well developed, in no acute distress HEENT: normal Neck: no JVD, carotid bruits, or masses Cardiac: IRRR; no murmurs, rubs, or gallops,no edema  Respiratory:  clear to auscultation bilaterally, normal work of breathing GI: soft, nontender, nondistended, + BS MS: no deformity or atrophy Skin: warm and dry, no rash Neuro:  Strength and sensation are intact Psych: euthymic mood, full affect  Recent Labs: 04/24/2015: TSH 1.878 05/22/2015: BUN 15; Creat 0.99; Hemoglobin 15.1; Platelets 241; Potassium 4.3; Sodium 142    Lipid Panel No results found for: CHOL, TRIG, HDL, CHOLHDL, VLDL, LDLCALC, LDLDIRECT    Wt Readings from Last 3 Encounters:  06/19/15 205 lb (92.987 kg)  06/05/15 202 lb (91.627 kg)  04/24/15 204 lb 1.6 oz (92.579 kg)     ASSESSMENT AND PLAN:  1. Atrial fib: Change from Xarelto to Eliquis has brought about  resolution of rash. He is given Rx for Elqius 5 mg BID, and given a 30- coupon for this medication. He is given a local Rx and also one that is mail order. HR is still in the 64' to 70's on his BP record.   2. Hypertension: Weakness and dizziness is gone, BP is better. He tolerates BP in the 123456 systolic much better than [id number] systolic. He is happy that the medication changes have allowed a positive result.     Current medicines are reviewed at length with the patient today.     No orders of the defined types were placed in this encounter.     Disposition:   FU with 6  months unless symptomatic.  Signed, Jory Sims, NP  06/19/2015 2:30 PM    [facility] [address] [place], [place] [address] Phone: [phone]; Fax: [phone]

## 2015-06-19 NOTE — Patient Instructions (Signed)
Your physician wants you to follow-up in: 6 Months.  You will receive a reminder letter in the mail two months in advance. If you don't receive a letter, please call our office to schedule the follow-up appointment.  Your physician recommends that you continue on your current medications as directed. Please refer to the Current Medication list given to you today.  If you need a refill on your cardiac medications before your next appointment, please call your pharmacy.  Thank you for choosing  [facility]!   

## 2015-06-27 ENCOUNTER — Telehealth: Payer: Self-pay

## 2015-06-27 NOTE — Telephone Encounter (Signed)
Eliquis 5 mg use as directed approved thru 06/23/16 per [employer]

## 2015-07-04 DIAGNOSIS — Z85828 Personal history of other malignant neoplasm of skin: Secondary | ICD-10-CM | POA: Diagnosis not present

## 2015-07-04 DIAGNOSIS — L309 Dermatitis, unspecified: Secondary | ICD-10-CM | POA: Diagnosis not present

## 2015-07-04 DIAGNOSIS — Z08 Encounter for follow-up examination after completed treatment for malignant neoplasm: Secondary | ICD-10-CM | POA: Diagnosis not present

## 2015-07-06 DIAGNOSIS — G250 Essential tremor: Secondary | ICD-10-CM | POA: Diagnosis not present

## 2015-07-06 DIAGNOSIS — Z6828 Body mass index (BMI) 28.0-28.9, adult: Secondary | ICD-10-CM | POA: Diagnosis not present

## 2015-07-06 DIAGNOSIS — C61 Malignant neoplasm of prostate: Secondary | ICD-10-CM | POA: Diagnosis not present

## 2015-07-06 DIAGNOSIS — I4891 Unspecified atrial fibrillation: Secondary | ICD-10-CM | POA: Diagnosis not present

## 2015-07-06 DIAGNOSIS — Z0000 Encounter for general adult medical examination without abnormal findings: Secondary | ICD-10-CM | POA: Diagnosis not present

## 2015-07-07 DIAGNOSIS — E663 Overweight: Secondary | ICD-10-CM | POA: Diagnosis not present

## 2015-07-07 DIAGNOSIS — Z0000 Encounter for general adult medical examination without abnormal findings: Secondary | ICD-10-CM | POA: Diagnosis not present

## 2015-07-07 DIAGNOSIS — Z1389 Encounter for screening for other disorder: Secondary | ICD-10-CM | POA: Diagnosis not present

## 2015-07-07 DIAGNOSIS — Z6828 Body mass index (BMI) 28.0-28.9, adult: Secondary | ICD-10-CM | POA: Diagnosis not present

## 2015-07-10 DIAGNOSIS — Z96661 Presence of right artificial ankle joint: Secondary | ICD-10-CM | POA: Diagnosis not present

## 2015-07-10 DIAGNOSIS — M722 Plantar fascial fibromatosis: Secondary | ICD-10-CM | POA: Diagnosis not present

## 2015-07-10 DIAGNOSIS — Z471 Aftercare following joint replacement surgery: Secondary | ICD-10-CM | POA: Diagnosis not present

## 2015-07-10 DIAGNOSIS — Z96662 Presence of left artificial ankle joint: Secondary | ICD-10-CM | POA: Diagnosis not present

## 2015-09-22 DIAGNOSIS — Z96662 Presence of left artificial ankle joint: Secondary | ICD-10-CM | POA: Diagnosis not present

## 2015-09-22 DIAGNOSIS — M25571 Pain in right ankle and joints of right foot: Secondary | ICD-10-CM | POA: Diagnosis not present

## 2015-09-22 DIAGNOSIS — M19071 Primary osteoarthritis, right ankle and foot: Secondary | ICD-10-CM | POA: Diagnosis not present

## 2015-09-22 DIAGNOSIS — M722 Plantar fascial fibromatosis: Secondary | ICD-10-CM | POA: Diagnosis not present

## 2015-10-13 DIAGNOSIS — M19071 Primary osteoarthritis, right ankle and foot: Secondary | ICD-10-CM | POA: Diagnosis not present

## 2015-10-13 DIAGNOSIS — Z96661 Presence of right artificial ankle joint: Secondary | ICD-10-CM | POA: Diagnosis not present

## 2015-10-13 DIAGNOSIS — Z96662 Presence of left artificial ankle joint: Secondary | ICD-10-CM | POA: Diagnosis not present

## 2015-10-13 DIAGNOSIS — Z471 Aftercare following joint replacement surgery: Secondary | ICD-10-CM | POA: Diagnosis not present

## 2015-10-27 DIAGNOSIS — R972 Elevated prostate specific antigen [PSA]: Secondary | ICD-10-CM | POA: Diagnosis not present

## 2015-10-27 DIAGNOSIS — N401 Enlarged prostate with lower urinary tract symptoms: Secondary | ICD-10-CM | POA: Diagnosis not present

## 2015-10-27 DIAGNOSIS — N320 Bladder-neck obstruction: Secondary | ICD-10-CM | POA: Diagnosis not present

## 2015-11-23 ENCOUNTER — Encounter: Payer: Self-pay | Admitting: Internal Medicine

## 2015-12-13 ENCOUNTER — Ambulatory Visit (INDEPENDENT_AMBULATORY_CARE_PROVIDER_SITE_OTHER): Payer: Medicare Other | Admitting: Physician Assistant

## 2015-12-13 ENCOUNTER — Other Ambulatory Visit (HOSPITAL_COMMUNITY)
Admission: RE | Admit: 2015-12-13 | Discharge: 2015-12-13 | Disposition: A | Discharge status: Home / Self Care | Payer: Medicare Other | Source: Ambulatory Visit | Attending: Physician Assistant | Admitting: Physician Assistant

## 2015-12-13 ENCOUNTER — Encounter: Payer: Self-pay | Admitting: Physician Assistant

## 2015-12-13 ENCOUNTER — Ambulatory Visit (HOSPITAL_COMMUNITY)
Admission: RE | Admit: 2015-12-13 | Discharge: 2015-12-13 | Disposition: A | Discharge status: Home / Self Care | Payer: Medicare Other | Source: Ambulatory Visit | Attending: Physician Assistant | Admitting: Physician Assistant

## 2015-12-13 VITALS — BP 122/76 | HR 52 | Ht 70.0 in | Wt 200.0 lb

## 2015-12-13 DIAGNOSIS — I10 Essential (primary) hypertension: Secondary | ICD-10-CM | POA: Insufficient documentation

## 2015-12-13 DIAGNOSIS — R42 Dizziness and giddiness: Secondary | ICD-10-CM | POA: Insufficient documentation

## 2015-12-13 DIAGNOSIS — I4891 Unspecified atrial fibrillation: Secondary | ICD-10-CM

## 2015-12-13 HISTORY — DX: Dizziness and giddiness: R42

## 2015-12-13 LAB — CBC WITH DIFFERENTIAL/PLATELET
BASOS PCT: 0 %
Basophils Absolute: 0 10*3/uL (ref 0.0–0.1)
EOS ABS: 0.3 10*3/uL (ref 0.0–0.7)
Eosinophils Relative: 4 %
HCT: 46.1 % (ref 39.0–52.0)
HEMOGLOBIN: 15.9 g/dL (ref 13.0–17.0)
Lymphocytes Relative: 34 %
Lymphs Abs: 2.7 10*3/uL (ref 0.7–4.0)
MCH: 32.3 pg (ref 26.0–34.0)
MCHC: 34.5 g/dL (ref 30.0–36.0)
MCV: 93.7 fL (ref 78.0–100.0)
MONOS PCT: 9 %
Monocytes Absolute: 0.7 10*3/uL (ref 0.1–1.0)
NEUTROS PCT: 53 %
Neutro Abs: 4.2 10*3/uL (ref 1.7–7.7)
Platelets: 194 10*3/uL (ref 150–400)
RBC: 4.92 MIL/uL (ref 4.22–5.81)
RDW: 14 % (ref 11.5–15.5)
WBC: 7.8 10*3/uL (ref 4.0–10.5)

## 2015-12-13 LAB — BASIC METABOLIC PANEL
Anion gap: 6 (ref 5–15)
BUN: 20 mg/dL (ref 6–20)
CO2: 29 mmol/L (ref 22–32)
Calcium: 9.6 mg/dL (ref 8.9–10.3)
Chloride: 106 mmol/L (ref 101–111)
Creatinine, Ser: 1.15 mg/dL (ref 0.61–1.24)
GFR calc Af Amer: >60 mL/min (ref >60)
GFR, EST NON AFRICAN AMERICAN: 60 mL/min — AB (ref >60)
Glucose, Bld: 89 mg/dL (ref 65–99)
POTASSIUM: 4.5 mmol/L (ref 3.5–5.1)
SODIUM: 141 mmol/L (ref 135–145)

## 2015-12-13 LAB — TSH: TSH: 1.661 u[IU]/mL (ref 0.350–4.500)

## 2015-12-13 NOTE — Progress Notes (Signed)
Cardiology Office Note    Date:  12/13/2015   ID:  Brent Wilkins September 21, 1940, MRN [id number]  PCP:  Glo Herring., MD  Cardiologist:  Dr. Dorris Carnes   [id number] and shortness of breath  History of Present Illness:  Brent Wilkins is a 75 y.o. male  with history of syncope and positive tilt table test in 2003 treated with sertraline and beta blocker. Also has history of HLD, hypothyroidism and tremor. Patient had a normal Lexi scan Myoview in 2012. Echo 04/2015 EF 60-65% mild MR left atrium severely dilated.. Patient was found to have new A. fib 04/2015 and placed on Xarelto for CHADSVASC=3. Patient then developed a rash and was switched to Eliquis by Vilinda Flake, NP. She also decrease his amlodipine because of slight hypotension.  Patient comes in with several month history of dizziness with change in position and with activity. Also complains of dyspnea on exertion which is new for him. Doesn't exercise other than mowing lawn. He's never had fast heart rates with afib.Last labs 05/2015 stable and normal TSH 04/2015.Denies chest pain, fast heart rates, syncope. He is fairly inactive since his ankle surgery over a year ago. He doesn't exercise regularly.    Past Medical History  Diagnosis Date  . HTN (hypertension)   . Seasonal allergies   . Neuropathy (HCC)     Bilateral ankles  . Hypothyroidism   . Dysphagia   . Syncope  dx. 8 yrs ago  . GERD (gastroesophageal reflux disease)   . History of kidney stones   . Arthritis   . Frequent urination at night   . 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 Prostate cancer [facility]) 2010    Dr. Rosana Hoes    Past Surgical History  Procedure Laterality Date  . Tonsillectomy    . Appendectomy    . Colonoscopy  07/23/10    Dr. Vivi Ferns rectum, scattered pancolonic diverticula  . Colonoscopy  08/10/1999    internal hemorrhoids,inflammatory polyp  . Cholecystectomy    . Esophagogastroduodenoscopy  04/12/08    Prominent Schatzki's  ring, erosive reflux esophagitis, multiple antral erosions, small hiatal hernia, reactive gastropathy, status post dilation with 75F  . Esophagogastroduodenoscopy  10/28/2011    Procedure: ESOPHAGOGASTRODUODENOSCOPY (EGD);  Surgeon: Daneil Dolin, MD;  [facility];  Service: Endoscopy;  Laterality: N/A;  1:30  . Savory dilation  10/28/2011    Procedure: SAVORY DILATION;  Surgeon: Daneil Dolin, MD;  [facility];  Service: Endoscopy;  Laterality: N/A;  Venia Minks dilation  10/28/2011    Procedure: Venia Minks DILATION;  Surgeon: Daneil Dolin, MD;  [facility];  Service: Endoscopy;  Laterality: N/A;  . 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 Total ankle arthroplasty Right 07/14/2014    dr hewitt  . Total ankle arthroplasty Right 07/14/2014    Procedure: RIGHT TOTAL ANKLE ARTHOPLASTY;  Surgeon: Wylene Simmer, MD;  Location: [place];  Service: Orthopedics;  Laterality: Right;  . Cataract extraction w/phaco Left 03/27/2015    Procedure: CATARACT EXTRACTION PHACO AND INTRAOCULAR LENS PLACEMENT LEFT EYE CDE=11.73;  Surgeon: Tonny Branch, MD;  [facility];  Service: Ophthalmology;  Laterality: Left;  . Cataract extraction w/phaco Right 03/30/2015    Procedure: CATARACT EXTRACTION PHACO AND INTRAOCULAR LENS PLACEMENT RIGHT EYE CDE=7.35;  Surgeon: Tonny Branch, MD;  [facility];  Service: Ophthalmology;  Laterality: Right;    Current Medications: Outpatient Prescriptions Prior to Visit  Medication Sig Dispense Refill  . amLODipine (NORVASC) 5 MG tablet Take 1 tablet (5 mg total) by mouth daily. 90 tablet 3  . apixaban (ELIQUIS) 5 MG TABS tablet Take 1 tablet (5 mg total) by mouth 2 (two) times daily. 180 tablet 3  . finasteride (PROSCAR) 5 MG tablet Take 5 mg by mouth daily.    . fluticasone (FLONASE) 50 MCG/ACT nasal spray Place 1 spray into  both nostrils daily as needed for allergies.     Marland Kitchen gabapentin (NEURONTIN) 300 MG capsule     . levothyroxine (SYNTHROID, LEVOTHROID) 75 MCG tablet Take 75 mcg by mouth daily.  0  . Multiple Vitamin (MULITIVITAMIN WITH MINERALS) TABS Take 1 tablet by mouth daily.    . Omega-3 Fatty Acids (RA FISH OIL) 1400 MG CPDR Take 1,400 mg by mouth daily.    Marland Kitchen omeprazole (PRILOSEC) 20 MG capsule Take 20 mg by mouth at bedtime.    . propranolol (INDERAL) 10 MG tablet Take 1 tablet by mouth two  times daily as needed 180 tablet 2  . Tamsulosin HCl (FLOMAX) 0.4 MG CAPS Take 0.4 mg by mouth daily after breakfast.     . vitamin B-12 (CYANOCOBALAMIN) 500 MCG tablet Take 500 mcg by mouth daily.     No facility-administered medications prior to visit.     Allergies:   Oxycodone; Statins; Tetanus toxoids; Penicillins; and Sulfa antibiotics   Social History   Social History  . Marital Status: Married    Spouse Name: N/A  . Number of Children: 2  . Years of Education: N/A   Occupational History  . retired; Nurse, learning disability    Social History Main Topics  . Smoking status: Never Smoker   . Smokeless tobacco: Never Used  . Alcohol Use: 0.6 - 1.2 oz/week    1-2 Cans of beer per week     Comment: 2 beers a week  . Drug Use: No  . Sexual Activity: Not Asked   Other Topics Concern  . None   Social History Narrative     Family History:  The patient's    family history includes Prostate cancer in his brother and father; Prostate cancer (age of onset: 37) in his son.   ROS:   Please see the history of present illness.    Review of Systems  Constitution: Positive for weight loss.  HENT: Negative.   Cardiovascular: Positive for dyspnea on exertion and irregular heartbeat.  Respiratory: Positive for shortness of breath.   Endocrine: Negative.   Hematologic/Lymphatic: Negative.   Musculoskeletal: Positive for joint pain.  Gastrointestinal: Negative.   Genitourinary: Negative.   Neurological:  Positive for dizziness and light-headedness.   All other systems reviewed and are negative.   PHYSICAL EXAM:   VS:  BP 122/76 mmHg  Pulse 52  Ht 5\' 10"  (1.778 m)  Wt 200 lb (90.719 kg)  BMI 28.70 kg/m2  SpO2 98%   GEN: Well nourished, well developed, in no acute distress Neck: no JVD, carotid bruits, or masses Cardiac: RRR; 2/6 systolic murmur LSB,no rubs, or gallops,no edema  Respiratory:  clear to auscultation bilaterally, normal work of breathing GI: soft, nontender, nondistended, + BS MS: no deformity or atrophy Skin: warm and dry, no rash Neuro:  Alert and Oriented x 3, Strength and sensation are intact Psych: euthymic mood, full affect  Wt Readings from Last 3 Encounters:  12/13/15 200 lb (90.719 kg)  06/19/15 205 lb (92.987 kg)  06/05/15 202 lb (91.627 kg)      Studies/Labs Reviewed:   EKG:  EKG is ordered today.  The ekg ordered today demonstrates atrial fibrillation with slow rates in 40's-60's  Recent Labs: 04/24/2015: TSH 1.878 05/22/2015: BUN 15; Creat 0.99; Hemoglobin 15.1; Platelets 241; Potassium 4.3; Sodium 142   Lipid Panel No results found for: CHOL, TRIG, HDL, CHOLHDL, VLDL, LDLCALC, LDLDIRECT  Additional studies/ records that were reviewed today include:  Most recent echocardiogram 04/26/2015 Left ventricle: The cavity size was normal. Wall thickness was   increased increased in a pattern of mild to moderate LVH.   Systolic function was normal. The estimated ejection fraction was   in the range of 60% to 65%. Wall motion was normal; there were no   regional wall motion abnormalities. - Aortic valve: Valve area (VTI): 2.18 cm^2. Valve area (Vmax):   2.13 cm^2. - Mitral valve: There was mild regurgitation. - Left atrium: The atrium was severely dilated. - Right ventricle: The cavity size was mildly dilated. - Right atrium: The atrium was severely dilated. - Atrial septum: No defect or patent foramen ovale was identified. - Pulmonary arteries:  Systolic pressure was mildly increased. PA   peak pressure: 34 mm Hg (S).      ASSESSMENT:    1. Atrial fibrillation, unspecified type ([place])   2. Dizziness   3. Essential hypertension      PLAN:  In order of problems listed above:  Atrial fibrillation patient's heart rate is low in the 40s at times when check an EKG. He is having symptoms of dizziness and dyspnea on exertion. He has never had fast heart rates with his atrial fibrillation but had a positive tilt table test in 2003 and has been on Inderal since. I will place a 48 hour monitor on him to see what his heart rates are doing and see him back next week. He is not orthostatic in the office. Blood pressures are stable. We'll also check labs including TSH since it's been since last September. Follow-up with Dr. Harrington Challenger scheduled for July.    Medication Adjustments/Labs and Tests Ordered: Current medicines are reviewed at length with the patient today.  Concerns regarding medicines are outlined above.  Medication changes, Labs and Tests ordered today are listed in the Patient Instructions below. There are no Patient Instructions on file for this visit.   Sumner Boast, PA-C  12/13/2015 12:26 PM    [facility] [place], [place], [place]  [address] Phone: [phone]; Fax: [phone]

## 2015-12-13 NOTE — Patient Instructions (Signed)
Your physician recommends that you schedule a follow-up appointment in: [place], PA-C  Your physician has recommended that you wear a 48 hour holter monitor. Holter monitors are medical devices that record the heart's electrical activity. Doctors most often use these monitors to diagnose arrhythmias. Arrhythmias are problems with the speed or rhythm of the heartbeat. The monitor is a small, portable device. You can wear one while you do your normal daily activities. This is usually used to diagnose what is causing palpitations/syncope (passing out).   Your physician recommends that you continue on your current medications as directed. Please refer to the Current Medication list given to you today.  Your physician recommends that you have lab work done today.  If you need a refill on your cardiac medications before your next appointment, please call your pharmacy.  Thank you for choosing [facility]!

## 2015-12-20 ENCOUNTER — Encounter: Payer: Self-pay | Admitting: Physician Assistant

## 2015-12-20 ENCOUNTER — Ambulatory Visit (INDEPENDENT_AMBULATORY_CARE_PROVIDER_SITE_OTHER): Payer: Medicare Other | Admitting: Physician Assistant

## 2015-12-20 VITALS — BP 124/82 | HR 69 | Ht 70.0 in | Wt 201.0 lb

## 2015-12-20 DIAGNOSIS — R42 Dizziness and giddiness: Secondary | ICD-10-CM | POA: Diagnosis not present

## 2015-12-20 DIAGNOSIS — I480 Paroxysmal atrial fibrillation: Secondary | ICD-10-CM | POA: Diagnosis not present

## 2015-12-20 DIAGNOSIS — I10 Essential (primary) hypertension: Secondary | ICD-10-CM | POA: Diagnosis not present

## 2015-12-20 MED ORDER — PROPRANOLOL HCL 10 MG PO TABS: 10.0000 mg | ORAL_TABLET | Freq: Every day | ORAL | Status: DC

## 2015-12-20 NOTE — Progress Notes (Signed)
Cardiology Office Note    Date:  12/20/2015   ID:  Brent, Wilkins 1941/06/05, MRN [id number]  PCP:  Glo Herring., MD  Cardiologist:  Dr. Dorris Carnes   [id number] and shortness of breath  History of Present Illness:  Brent Wilkins is a 75 y.o. male  with history of syncope and positive tilt table test in 2003 treated with sertraline and beta blocker. Also has history of HLD, hypothyroidism and tremor. Patient had a normal Lexi scan Myoview in 2012. Echo 04/2015 EF 60-65% mild MR left atrium severely dilated.. Patient was found to have new A. fib 04/2015 and placed on Xarelto for CHADSVASC=3. Patient then developed a rash and was switched to Eliquis by Brent Flake, NP. She also decrease his amlodipine because of slight hypotension.  I saw the patient 12/13/15 with several month history of dizziness with change in position and with activity. Also complains of dyspnea on exertion which is new for him. Doesn't exercise other than mowing lawn. He's never had fast heart rates with afib.Last labs 05/2015 stable and normal TSH 04/2015.Denies chest pain, fast heart rates, syncope. He is fairly inactive since his ankle surgery over a year ago. He doesn't exercise regularly.  Patient was not orthostatic in the office. Cemented, CBC and TSH were all normal. I placed a 48 hour monitor on him which showed predominant rhythm was atrial fibrillation with rare ventricular ectopy. Minimum heart rate 42 maximum heart rate 103 average 67. Educational 2.4-2.6 second positive all in early a.m. hours presumably while sleeping. Reported symptoms correlated with A. fib with normal heart rates.  Patient comes in today for follow-up. He had an episode Sunday when he was standing still at church cutting vegetables for a long time. He became very dizzy and had to sit down. The symptoms pass quickly. Now they thinks about most his symptoms occur when he standing for long periods of time. If he is walking or  moving around he doesn't have symptoms. Although he did get dizzy while riding his tractor. He came in now drinking orange juice and felt better. He rode his bike 5 miles yesterday and couldn't get his heart rate above 79 bpm.    Past Medical History  Diagnosis Date  . HTN (hypertension)   . Seasonal allergies   . Neuropathy (HCC)     Bilateral ankles  . Hypothyroidism   . Dysphagia   . Syncope  dx. 8 yrs ago  . GERD (gastroesophageal reflux disease)   . History of kidney stones   . Arthritis   . Frequent urination at night   . 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 Prostate cancer [facility]) 2010    Dr. Rosana Hoes    Past Surgical History  Procedure Laterality Date  . Tonsillectomy    . Appendectomy    . Colonoscopy  07/23/10    Dr. Vivi Ferns rectum, scattered pancolonic diverticula  . Colonoscopy  08/10/1999    internal hemorrhoids,inflammatory polyp  . Cholecystectomy    . Esophagogastroduodenoscopy  04/12/08    Prominent Schatzki's ring, erosive reflux esophagitis, multiple antral erosions, small hiatal hernia, reactive gastropathy, status post dilation with 93F  . Esophagogastroduodenoscopy  10/28/2011    Procedure: ESOPHAGOGASTRODUODENOSCOPY (EGD);  Surgeon: Daneil Dolin, MD;  [facility];  Service: Endoscopy;  Laterality: N/A;  1:30  . Savory dilation  10/28/2011    Procedure: SAVORY DILATION;  Surgeon: Daneil Dolin, MD;  [facility];  Service: Endoscopy;  Laterality: N/A;  Venia Minks dilation  10/28/2011    Procedure: Venia Minks DILATION;  Surgeon: Daneil Dolin, MD;  [facility];  Service: Endoscopy;  Laterality: N/A;  . 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 Total ankle arthroplasty Right 07/14/2014    dr hewitt  . Total ankle arthroplasty Right 07/14/2014    Procedure: RIGHT TOTAL ANKLE ARTHOPLASTY;  Surgeon:  Wylene Simmer, MD;  Location: [place];  Service: Orthopedics;  Laterality: Right;  . Cataract extraction w/phaco Left 03/27/2015    Procedure: CATARACT EXTRACTION PHACO AND INTRAOCULAR LENS PLACEMENT LEFT EYE CDE=11.73;  Surgeon: Tonny Branch, MD;  [facility];  Service: Ophthalmology;  Laterality: Left;  . Cataract extraction w/phaco Right 03/30/2015    Procedure: CATARACT EXTRACTION PHACO AND INTRAOCULAR LENS PLACEMENT RIGHT EYE CDE=7.35;  Surgeon: Tonny Branch, MD;  [facility];  Service: Ophthalmology;  Laterality: Right;    Current Medications: Outpatient Prescriptions Prior to Visit  Medication Sig Dispense Refill  . amLODipine (NORVASC) 5 MG tablet Take 1 tablet (5 mg total) by mouth daily. 90 tablet 3  . apixaban (ELIQUIS) 5 MG TABS tablet Take 1 tablet (5 mg total) by mouth 2 (two) times daily. 180 tablet 3  . finasteride (PROSCAR) 5 MG tablet Take 5 mg by mouth daily.    . fluticasone (FLONASE) 50 MCG/ACT nasal spray Place 1 spray into both nostrils daily as needed for allergies.     Marland Kitchen gabapentin (NEURONTIN) 300 MG capsule     . ibuprofen (ADVIL,MOTRIN) 600 MG tablet Take 600 mg by mouth every 6 (six) hours as needed.    Marland Kitchen levothyroxine (SYNTHROID, LEVOTHROID) 75 MCG tablet Take 75 mcg by mouth daily.  0  . Multiple Vitamin (MULITIVITAMIN WITH MINERALS) TABS Take 1 tablet by mouth daily.    . Omega-3 Fatty Acids (RA FISH OIL) 1400 MG CPDR Take 1,400 mg by mouth daily.    Marland Kitchen omeprazole (PRILOSEC) 20 MG capsule Take 20 mg by mouth at bedtime.    . Tamsulosin HCl (FLOMAX) 0.4 MG CAPS Take 0.4 mg by mouth daily after breakfast.     . vitamin B-12 (CYANOCOBALAMIN) 500 MCG tablet Take 500 mcg by mouth daily.    . propranolol (INDERAL) 10 MG tablet Take 1 tablet by mouth two  times daily as needed 180 tablet 2   No facility-administered medications prior to visit.     Allergies:   Oxycodone; Statins; Tetanus toxoids; Penicillins; and Sulfa antibiotics   Social History   Social  History  . Marital Status: Married    Spouse Name: N/A  . Number of Children: 2  . Years of Education: N/A   Occupational History  . retired; Nurse, learning disability    Social History Main Topics  . Smoking status: Never Smoker   . Smokeless tobacco: Never Used  . Alcohol Use: 0.6 - 1.2 oz/week    1-2 Cans of beer per week     Comment: 2 beers a week  . Drug Use: No  . Sexual Activity: Not Asked   Other Topics Concern  . None   Social History Narrative     Family History:  The patient's    family history includes Prostate cancer in his brother and father; Prostate cancer (age of onset: 51) in his son.   ROS:    Please see the  history of present illness.    Review of Systems  Constitution: Positive for weight loss.  HENT: Negative.   Cardiovascular: Positive for dyspnea on exertion and irregular heartbeat.  Respiratory: Positive for shortness of breath.   Endocrine: Negative.   Hematologic/Lymphatic: Negative.   Musculoskeletal: Positive for joint pain.  Gastrointestinal: Negative.   Genitourinary: Negative.   Neurological: Positive for dizziness and light-headedness.   All other systems reviewed and are negative.   PHYSICAL EXAM:   VS:  BP 124/82 mmHg  Pulse 69  Ht 5\' 10"  (1.778 m)  Wt 201 lb (91.173 kg)  BMI 28.84 kg/m2  SpO2 97%   GEN: Well nourished, well developed, in no acute distress Neck: no JVD, carotid bruits, or masses Cardiac: RRR; 2/6 systolic murmur LSB,no rubs, or gallops,no edema  Respiratory:  clear to auscultation bilaterally, normal work of breathing GI: soft, nontender, nondistended, + BS MS: no deformity or atrophy Skin: warm and dry, no rash Neuro:  Alert and Oriented x 3, Strength and sensation are intact Psych: euthymic mood, full affect  Wt Readings from Last 3 Encounters:  12/20/15 201 lb (91.173 kg)  12/13/15 200 lb (90.719 kg)  06/19/15 205 lb (92.987 kg)      Studies/Labs Reviewed:   EKG:  EKG is ordered today.  The ekg  ordered today demonstrates atrial fibrillation with slow rates in 40's-60's  Recent Labs: 12/13/2015: BUN 20; Creatinine, Ser 1.15; Hemoglobin 15.9; Platelets 194; Potassium 4.5; Sodium 141; TSH 1.661   Lipid Panel No results found for: CHOL, TRIG, HDL, CHOLHDL, VLDL, LDLCALC, LDLDIRECT  Additional studies/ records that were reviewed today include:  Most recent echocardiogram 04/26/2015 Left ventricle: The cavity size was normal. Wall thickness was   increased increased in a pattern of mild to moderate LVH.   Systolic function was normal. The estimated ejection fraction was   in the range of 60% to 65%. Wall motion was normal; there were no   regional wall motion abnormalities. - Aortic valve: Valve area (VTI): 2.18 cm^2. Valve area (Vmax):   2.13 cm^2. - Mitral valve: There was mild regurgitation. - Left atrium: The atrium was severely dilated. - Right ventricle: The cavity size was mildly dilated. - Right atrium: The atrium was severely dilated. - Atrial septum: No defect or patent foramen ovale was identified. - Pulmonary arteries: Systolic pressure was mildly increased. PA   peak pressure: 34 mm Hg (S).   Study Highlights      Predominant rhythm is atrial fibrillation  Rare ventricular ectopy  Min HR 42, Max HR 103. Avg HR 67. Occasional 2.4-2.6 second pauses,all in early AM hours presumably while sleeping  Reported symptoms correlated with afib with normal heart rates        ASSESSMENT:    1. Paroxysmal atrial fibrillation (HCC)   2. Essential hypertension   3. Dizziness      PLAN:  In order of problems listed above:  Atrial fibrillation: 48 hour monitor showed heart rate between 42 and 103 with several 2.42.6 second pauses that seem to occur in the early morning hours while he was still sleeping.Marland Kitchen He has never had fast heart rates with his atrial fibrillation but had a positive tilt table test in 2003 and has been on Inderal since. All his labs were normal  including TSH. I discussed this patient with Dr.Koneswaran. We'll decrease Inderal to 10 mg once daily to see if this helps his symptoms. Also recommend compression hose if he knows he is going to be  standing for long periods of time. Follow-up with Dr. Harrington Challenger scheduled for July.  Essential hypertension controlled  Dizziness: Compression stockings and decrease Inderal    Medication Adjustments/Labs and Tests Ordered: Current medicines are reviewed at length with the patient today.  Concerns regarding medicines are outlined above.  Medication changes, Labs and Tests ordered today are listed in the Patient Instructions below. There are no Patient Instructions on file for this visit.   Signed, Ermalinda Barrios, PA-C  12/20/2015 2:45 PM    [facility] [address], [place], [place]  [address] Phone: [phone]; Fax: [phone]

## 2015-12-20 NOTE — Patient Instructions (Signed)
Your physician recommends that you schedule a follow-up appointment with Dr. Harrington Challenger  Your physician has recommended you make the following change in your medication:   Decrease Inderal 10 mg Daily  Wear compression stockings when standing   If you need a refill on your cardiac medications before your next appointment, please call your pharmacy.  Thank you for choosing [facility]!

## 2016-01-02 DIAGNOSIS — X32XXXD Exposure to sunlight, subsequent encounter: Secondary | ICD-10-CM | POA: Diagnosis not present

## 2016-01-02 DIAGNOSIS — Z08 Encounter for follow-up examination after completed treatment for malignant neoplasm: Secondary | ICD-10-CM | POA: Diagnosis not present

## 2016-01-02 DIAGNOSIS — Z85828 Personal history of other malignant neoplasm of skin: Secondary | ICD-10-CM | POA: Diagnosis not present

## 2016-01-02 DIAGNOSIS — L570 Actinic keratosis: Secondary | ICD-10-CM | POA: Diagnosis not present

## 2016-01-02 DIAGNOSIS — D225 Melanocytic nevi of trunk: Secondary | ICD-10-CM | POA: Diagnosis not present

## 2016-01-26 ENCOUNTER — Ambulatory Visit: Payer: Medicare Other | Admitting: Internal Medicine

## 2016-02-12 ENCOUNTER — Encounter: Payer: Self-pay | Admitting: Internal Medicine

## 2016-02-12 ENCOUNTER — Ambulatory Visit (INDEPENDENT_AMBULATORY_CARE_PROVIDER_SITE_OTHER): Payer: Medicare Other | Admitting: Internal Medicine

## 2016-02-12 VITALS — BP 134/84 | HR 70 | Ht 70.0 in | Wt 199.0 lb

## 2016-02-12 DIAGNOSIS — I480 Paroxysmal atrial fibrillation: Secondary | ICD-10-CM

## 2016-02-12 DIAGNOSIS — R55 Syncope and collapse: Secondary | ICD-10-CM

## 2016-02-12 NOTE — Patient Instructions (Signed)
Your physician wants you to follow-up in: March 2018 You will receive a reminder letter in the mail two months in advance. If you don't receive a letter, please call our office to schedule the follow-up appointment.     Your physician recommends that you continue on your current medications as directed. Please refer to the Current Medication list given to you today.    Thank you for choosing [facility] !

## 2016-02-12 NOTE — Progress Notes (Signed)
Cardiology Office Note   Date:  02/12/2016   ID:  Brent Wilkins 05-20-1941, MRN [id number]  PCP:  Glo Herring., MD  Cardiologist:   Dorris Carnes, MD   F/U of afib and dizziness    History of Present Illness: Brent Wilkins is a 75 y.o. male with a history of syncope and positive tilt tabale  Also HL , PAF  CHASVASc 3.   Holter  Average HR 67  No significant pauses    n Inderal decreased to 10 mg per day due to dizziness  Since this change was made he says that it took about 3 wks but he is feeling better  Not dizzy   Active  Has problems with arthritis.  Does give out easier than he did several years ago  No recnet change      Outpatient Prescriptions Prior to Visit  Medication Sig Dispense Refill  . amLODipine (NORVASC) 5 MG tablet Take 1 tablet (5 mg total) by mouth daily. 90 tablet 3  . apixaban (ELIQUIS) 5 MG TABS tablet Take 1 tablet (5 mg total) by mouth 2 (two) times daily. 180 tablet 3  . finasteride (PROSCAR) 5 MG tablet Take 5 mg by mouth daily.    . fluticasone (FLONASE) 50 MCG/ACT nasal spray Place 1 spray into both nostrils daily as needed for allergies.     Marland Kitchen gabapentin (NEURONTIN) 300 MG capsule Take 300 mg by mouth daily.     Marland Kitchen ibuprofen (ADVIL,MOTRIN) 600 MG tablet Take 600 mg by mouth every 6 (six) hours as needed.    Marland Kitchen levothyroxine (SYNTHROID, LEVOTHROID) 75 MCG tablet Take 75 mcg by mouth daily.  0  . Multiple Vitamin (MULITIVITAMIN WITH MINERALS) TABS Take 1 tablet by mouth daily.    . Omega-3 Fatty Acids (RA FISH OIL) 1400 MG CPDR Take 1,400 mg by mouth daily.    Marland Kitchen omeprazole (PRILOSEC) 20 MG capsule Take 20 mg by mouth at bedtime.    . propranolol (INDERAL) 10 MG tablet Take 1 tablet (10 mg total) by mouth daily. 30 tablet 11  . Tamsulosin HCl (FLOMAX) 0.4 MG CAPS Take 0.4 mg by mouth daily after breakfast.     . vitamin B-12 (CYANOCOBALAMIN) 500 MCG tablet Take 500 mcg by mouth daily.     No facility-administered medications prior to visit.       Allergies:   Oxycodone; Statins; Tetanus toxoids; Penicillins; and Sulfa antibiotics   Past Medical History  Diagnosis Date  . HTN (hypertension)   . Seasonal allergies   . Neuropathy (HCC)     Bilateral ankles  . Hypothyroidism   . Dysphagia   . Syncope  dx. 8 yrs ago  . GERD (gastroesophageal reflux disease)   . History of kidney stones   . Arthritis   . Frequent urination at night   . 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 Prostate cancer [facility]) 2010    Dr. Rosana Hoes    Past Surgical History  Procedure Laterality Date  . Tonsillectomy    . Appendectomy    . Colonoscopy  07/23/10    Dr. Vivi Ferns rectum, scattered pancolonic diverticula  . Colonoscopy  08/10/1999    internal hemorrhoids,inflammatory polyp  . Cholecystectomy    . Esophagogastroduodenoscopy  04/12/08    Prominent Schatzki's ring, erosive reflux esophagitis, multiple antral erosions, small hiatal hernia, reactive gastropathy, status post dilation with 37F  . Esophagogastroduodenoscopy  10/28/2011    Procedure: ESOPHAGOGASTRODUODENOSCOPY (EGD);  Surgeon: Herbie Baltimore  Hilton Cork, MD;  [facility];  Service: Endoscopy;  Laterality: N/A;  1:30  . Savory dilation  10/28/2011    Procedure: SAVORY DILATION;  Surgeon: Daneil Dolin, MD;  [facility];  Service: Endoscopy;  Laterality: N/A;  Venia Minks dilation  10/28/2011    Procedure: Venia Minks DILATION;  Surgeon: Daneil Dolin, MD;  [facility];  Service: Endoscopy;  Laterality: N/A;  . 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 Total ankle arthroplasty Right 07/14/2014    dr hewitt  . Total ankle arthroplasty Right 07/14/2014    Procedure: RIGHT TOTAL ANKLE ARTHOPLASTY;  Surgeon: Wylene Simmer, MD;  Location: [place];  Service: Orthopedics;  Laterality: Right;  . Cataract extraction w/phaco Left 03/27/2015    Procedure:  CATARACT EXTRACTION PHACO AND INTRAOCULAR LENS PLACEMENT LEFT EYE CDE=11.73;  Surgeon: Tonny Branch, MD;  [facility];  Service: Ophthalmology;  Laterality: Left;  . Cataract extraction w/phaco Right 03/30/2015    Procedure: CATARACT EXTRACTION PHACO AND INTRAOCULAR LENS PLACEMENT RIGHT EYE CDE=7.35;  Surgeon: Tonny Branch, MD;  [facility];  Service: Ophthalmology;  Laterality: Right;     Social History:  The patient  reports that he has never smoked. He has never used smokeless tobacco. He reports that he drinks about 0.6 - 1.2 oz of alcohol per week. He reports that he does not use illicit drugs.   Family History:  The patient's family history includes Prostate cancer in his brother and father; Prostate cancer (age of onset: 74) in his son.    ROS:  Please see the history of present illness. All other systems are reviewed and  Negative to the above problem except as noted.    PHYSICAL EXAM: VS:  BP 134/84 mmHg  Pulse 70  Ht 5\' 10"  (1.778 m)  Wt 199 lb (90.266 kg)  BMI 28.55 kg/m2  SpO2 97%  GEN: Well nourished, well developed, in no acute distress HEENT: normal Neck: no JVD, carotid bruits, or masses Cardiac: RRR; no murmurs, rubs, or gallops,no edema  Respiratory:  clear to auscultation bilaterally, normal work of breathing GI: soft, nontender, nondistended, + BS  No hepatomegaly  MS: no deformity Moving all extremities   Skin: warm and dry, no rash Neuro:  Strength and sensation are intact Psych: euthymic mood, full affect   EKG:  EKG is not ordered today.   Lipid Panel No results found for: CHOL, TRIG, HDL, CHOLHDL, VLDL, LDLCALC, LDLDIRECT    Wt Readings from Last 3 Encounters:  02/12/16 199 lb (90.266 kg)  12/20/15 201 lb (91.173 kg)  12/13/15 200 lb (90.719 kg)      ASSESSMENT AND PLAN:  1.  Afib  Continue rate control and Eliquis 2  Dizziness  Better with backing down on inderal  Continue  Stay hydrated  If gets worse I would recomm cutting back on  amlodipine to 2.5   Call if does not improve  Labs OK in [place]    F/U in March       Signed, Dorris Carnes, MD  02/12/2016 9:15 AM    [place] [address], [place], [place]  [address] Phone: [phone]; Fax: [phone]

## 2016-04-25 DIAGNOSIS — G250 Essential tremor: Secondary | ICD-10-CM | POA: Diagnosis not present

## 2016-04-25 DIAGNOSIS — E782 Mixed hyperlipidemia: Secondary | ICD-10-CM | POA: Diagnosis not present

## 2016-04-25 DIAGNOSIS — R109 Unspecified abdominal pain: Secondary | ICD-10-CM | POA: Diagnosis not present

## 2016-04-25 DIAGNOSIS — I10 Essential (primary) hypertension: Secondary | ICD-10-CM | POA: Diagnosis not present

## 2016-04-25 DIAGNOSIS — Z1389 Encounter for screening for other disorder: Secondary | ICD-10-CM | POA: Diagnosis not present

## 2016-04-25 DIAGNOSIS — I4891 Unspecified atrial fibrillation: Secondary | ICD-10-CM | POA: Diagnosis not present

## 2016-04-29 DIAGNOSIS — H43813 Vitreous degeneration, bilateral: Secondary | ICD-10-CM | POA: Diagnosis not present

## 2016-04-29 DIAGNOSIS — Z9849 Cataract extraction status, unspecified eye: Secondary | ICD-10-CM | POA: Diagnosis not present

## 2016-04-29 DIAGNOSIS — Z961 Presence of intraocular lens: Secondary | ICD-10-CM | POA: Diagnosis not present

## 2016-04-30 ENCOUNTER — Encounter: Payer: Self-pay | Admitting: Internal Medicine

## 2016-05-06 DIAGNOSIS — G5791 Unspecified mononeuropathy of right lower limb: Secondary | ICD-10-CM | POA: Diagnosis not present

## 2016-05-06 DIAGNOSIS — M79671 Pain in right foot: Secondary | ICD-10-CM | POA: Diagnosis not present

## 2016-05-08 ENCOUNTER — Other Ambulatory Visit: Payer: Self-pay | Admitting: Adult Health

## 2016-05-09 ENCOUNTER — Encounter (INDEPENDENT_AMBULATORY_CARE_PROVIDER_SITE_OTHER): Payer: Self-pay

## 2016-05-09 ENCOUNTER — Encounter: Payer: Self-pay | Admitting: Nurse Practitioner

## 2016-05-09 ENCOUNTER — Ambulatory Visit (INDEPENDENT_AMBULATORY_CARE_PROVIDER_SITE_OTHER): Payer: Medicare Other | Admitting: Nurse Practitioner

## 2016-05-09 VITALS — BP 144/93 | HR 60 | Temp 97.4°F | Ht 70.0 in | Wt 191.0 lb

## 2016-05-09 DIAGNOSIS — K222 Esophageal obstruction: Secondary | ICD-10-CM | POA: Diagnosis not present

## 2016-05-09 DIAGNOSIS — K219 Gastro-esophageal reflux disease without esophagitis: Secondary | ICD-10-CM

## 2016-05-09 NOTE — Progress Notes (Signed)
Primary Care Physician:  Glo Herring., MD Primary Gastroenterologist:  Dr. Gala Romney  Chief Complaint  Patient presents with  . Abdominal Pain    HPI:   Brent Wilkins is a 75 y.o. male who presents For upper abdominal pain. Was referred by primary care. PCP notes reviewed. The patient last saw primary care 04/25/2016 at which point he noted GERD symptoms well controlled, but noted epigastric recurrent pain in the a.m. and postprandial. His currently on omeprazole. Labs included shows CBC with normal hemoglobin is 16.0, normal platelets, normal CMP with creatinine of 1.08 and normal liver function testing. Lipase was also normal at 51.  Patient was last seen by our office on 10/22/2011 for dysphagia, GERD, Schatzki's ring. At that time it was noted occasional heartburn with Tums as necessary, was advised to resume PPI due to history of complicated GERD, Schatzki's ring, gastritis and chronic NSAID use. Due to dysphagia he was arranged for an upper endoscopy which was completed on 10/28/2011 and found prominent Schatzki's ring status post dilation, otherwise normal esophagus, empty stomach, moderate-sized hernia, multiple antral erosions without ulcer or infiltrating process, patent pylorus, normal first and second duodenum. Recommended resume Prilosec 20 mg. 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  Today he states he's doing ok. He hasn't had any new pain. Is having some "discomfort" and nausea in the epigastric area. Typically in the morning after breakfast (typically toast and orange juice). No worsening postprandial otherwise. Denies vomiting. No other abdominal pain/discomfort. Denies hematochezia, melena, acute bowel habit changes. Denies chest pain, dyspnea, dizziness, lightheadedness, syncope, near syncope. Denies any other upper or lower GI symptoms.  Was taking Ibuprofen regularly but started Celebrex, typically in the  afternoon, not on Ibuprofen anymore now; This is a recent change in the past week.  Past Medical History:  Diagnosis Date  . Arthritis   . Dysphagia   . Frequent urination at night   . GERD (gastroesophageal reflux disease)   . History of kidney stones   . HTN (hypertension)   . Hypothyroidism   . 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 Neuropathy (HCC)    Bilateral ankles  . Prostate cancer [facility]) 2010   Dr. Rosana Hoes  . Seasonal allergies   . Syncope  dx. 8 yrs ago    Past Surgical History:  Procedure Laterality Date  . APPENDECTOMY    . CATARACT EXTRACTION W/PHACO Left 03/27/2015   Procedure: CATARACT EXTRACTION PHACO AND INTRAOCULAR LENS PLACEMENT LEFT EYE CDE=11.73;  Surgeon: Tonny Branch, MD;  Location: [facility];  Service: Ophthalmology;  Laterality: Left;  . CATARACT EXTRACTION W/PHACO Right 03/30/2015   Procedure: CATARACT EXTRACTION PHACO AND INTRAOCULAR LENS PLACEMENT RIGHT EYE CDE=7.35;  Surgeon: Tonny Branch, MD;  Location: [facility];  Service: Ophthalmology;  Laterality: Right;  . CHOLECYSTECTOMY    . COLONOSCOPY  07/23/10   Dr. Vivi Ferns rectum, scattered pancolonic diverticula  . COLONOSCOPY  08/10/1999   internal hemorrhoids,inflammatory polyp  . ESOPHAGOGASTRODUODENOSCOPY  04/12/08   Prominent Schatzki's ring, erosive reflux esophagitis, multiple antral erosions, small hiatal hernia, reactive gastropathy, status post dilation with 51F  . ESOPHAGOGASTRODUODENOSCOPY  10/28/2011   Procedure: ESOPHAGOGASTRODUODENOSCOPY (EGD);  Surgeon: Daneil Dolin, MD;  [facility];  Service: Endoscopy;  Laterality: N/A;  1:30  . MALONEY DILATION  10/28/2011   Procedure: Venia Minks DILATION;  Surgeon: Daneil Dolin, MD;  [facility];  Service: Endoscopy;  Laterality: N/A;  .  SAVORY DILATION  10/28/2011   Procedure: SAVORY DILATION;  Surgeon: Daneil Dolin, MD;  [facility];  Service: Endoscopy;  Laterality: N/A;  . TONSILLECTOMY    . TOTAL ANKLE ARTHROPLASTY Left  11/25/2013   Procedure: TOTAL ANKLE ARTHOPLASTY;  Surgeon: Wylene Simmer, MD;  Location: [place];  Service: Orthopedics;  Laterality: Left;  . TOTAL ANKLE ARTHROPLASTY Right 07/14/2014   dr hewitt  . TOTAL ANKLE ARTHROPLASTY Right 07/14/2014   Procedure: RIGHT TOTAL ANKLE ARTHOPLASTY;  Surgeon: Wylene Simmer, MD;  Location: [place];  Service: Orthopedics;  Laterality: Right;    Current Outpatient Prescriptions  Medication Sig Dispense Refill  . amLODipine (NORVASC) 5 MG tablet Take 1 tablet (5 mg total) by mouth daily. 90 tablet 3  . celecoxib (CELEBREX) 100 MG capsule Take 100 mg by mouth 2 (two) times daily.    Marland Kitchen ELIQUIS 5 MG TABS tablet TAKE 1 TABLET BY MOUTH TWO  TIMES DAILY 180 tablet 3  . finasteride (PROSCAR) 5 MG tablet Take 5 mg by mouth daily.    . fluticasone (FLONASE) 50 MCG/ACT nasal spray Place 1 spray into both nostrils daily as needed for allergies.     Marland Kitchen gabapentin (NEURONTIN) 300 MG capsule Take 300 mg by mouth daily.     Marland Kitchen ibuprofen (ADVIL,MOTRIN) 600 MG tablet Take 600 mg by mouth every 6 (six) hours as needed.    Marland Kitchen levothyroxine (SYNTHROID, LEVOTHROID) 75 MCG tablet Take 75 mcg by mouth daily.  0  . Multiple Vitamin (MULITIVITAMIN WITH MINERALS) TABS Take 1 tablet by mouth daily.    . Omega-3 Fatty Acids (RA FISH OIL) 1400 MG CPDR Take 1,400 mg by mouth daily.    Marland Kitchen omeprazole (PRILOSEC) 20 MG capsule Take 20 mg by mouth at bedtime.    . propranolol (INDERAL) 10 MG tablet Take 1 tablet (10 mg total) by mouth daily. 30 tablet 11  . Tamsulosin HCl (FLOMAX) 0.4 MG CAPS Take 0.4 mg by mouth daily after breakfast.     . vitamin B-12 (CYANOCOBALAMIN) 500 MCG tablet Take 500 mcg by mouth daily.     No current facility-administered medications for this visit.     Allergies as of 05/09/2016 - Review Complete 05/09/2016  Allergen Reaction Noted  . Oxycodone  07/06/2014  . Statins  05/10/2013  . Tetanus toxoids  05/10/2013  . Penicillins Swelling and Rash 10/22/2011  . Sulfa  antibiotics Swelling and Rash 10/22/2011    Family History  Problem Relation Age of Onset  . Prostate cancer Father   . Prostate cancer Son 84  . Prostate cancer Brother     Social History   Social History  . Marital status: Married    Spouse name: N/A  . Number of children: 2  . Years of education: N/A   Occupational History  . retired; Nurse, learning disability Retired   Social History Main Topics  . Smoking status: Never Smoker  . Smokeless tobacco: Never Used  . Alcohol use 0.6 - 1.2 oz/week    1 - 2 Cans of beer per week     Comment: 2 beers a week  . Drug use: No  . Sexual activity: Not on file   Other Topics Concern  . Not on file   Social History Narrative  . No narrative on file    Review of Systems: General: Negative for anorexia, weight loss, fever, chills, fatigue, weakness. ENT: Negative for hoarseness, difficulty swallowing CV: Negative for chest pain, angina, palpitations, peripheral edema.  Respiratory: Negative for dyspnea at rest, cough, sputum, wheezing.  GI: See history of present illness. MS: Negative worsening MS pain or falls/fractures.  Derm: Negative for rash or itching.  Endo: Negative for unusual weight change.  Heme: Negative for bruising or bleeding. Allergy: Negative for rash or hives.    Physical Exam: BP (!) 144/93   Pulse 60   Temp 97.4 F (36.3 C) (Oral)   Ht 5\' 10"  (1.778 m)   Wt 191 lb (86.6 kg)   BMI 27.41 kg/m  General:   Alert and oriented. Pleasant and cooperative. Well-nourished and well-developed.  Head:  Normocephalic and atraumatic. Eyes:  Without icterus, sclera clear and conjunctiva pink.  Ears:  Normal auditory acuity. Cardiovascular:  S1, S2 present without murmurs appreciated. Normal pulses noted. Extremities without clubbing or edema. Respiratory:  Clear to auscultation bilaterally. No wheezes, rales, or rhonchi. No distress.  Gastrointestinal:  +BS, soft, non-tender and non-distended. No guarding or  rebound. No masses appreciated.  Rectal:  Deferred  Musculoskalatal:  Symmetrical without gross deformities. Neurologic:  Alert and oriented x4;  grossly normal neurologically. Psych:  Alert and cooperative. Normal mood and affect. Heme/Lymph/Immune: No excessive bruising noted.    05/09/2016 10:52 AM   Disclaimer: This note was dictated with voice recognition software. Similar sounding words can inadvertently be transcribed and may not be corrected upon review.

## 2016-05-09 NOTE — Patient Instructions (Addendum)
1. Increase your omeprazole (Prilosec) to twice a day. 30 minutes before breakfast, and before bedtime. 2. Avoid trigger foods. I provided more information below. 3. Avoid taking NSAID medications (ibuprofen, Motrin, Advil, Aleve, naproxen, Naprosyn, other over-the-counter pain medicines, including aspirin; Tylenol is okay.). 4. Return for follow-up in 3 months   Food Choices for Gastroesophageal Reflux Disease, Adult When you have gastroesophageal reflux disease (GERD), the foods you eat and your eating habits are very important. Choosing the right foods can help ease the discomfort of GERD. WHAT GENERAL GUIDELINES DO I NEED TO FOLLOW?  Choose fruits, vegetables, whole grains, low-fat dairy products, and low-fat meat, fish, and poultry.  Limit fats such as oils, salad dressings, butter, nuts, and avocado.  Keep a food diary to identify foods that cause symptoms.  Avoid foods that cause reflux. These may be different for different people.  Eat frequent small meals instead of three large meals each day.  Eat your meals slowly, in a relaxed setting.  Limit fried foods.  Cook foods using methods other than frying.  Avoid drinking alcohol.  Avoid drinking large amounts of liquids with your meals.  Avoid bending over or lying down until 2-3 hours after eating. WHAT FOODS ARE NOT RECOMMENDED? The following are some foods and drinks that may worsen your symptoms: Vegetables Tomatoes. Tomato juice. Tomato and spaghetti sauce. Chili peppers. Onion and garlic. Horseradish. Fruits Oranges, grapefruit, and lemon (fruit and juice). Meats High-fat meats, fish, and poultry. This includes hot dogs, ribs, ham, sausage, salami, and bacon. Dairy Whole milk and chocolate milk. Sour cream. Cream. Butter. Ice cream. Cream cheese.  Beverages Coffee and tea, with or without caffeine. Carbonated beverages or energy drinks. Condiments Hot sauce. Barbecue sauce.  Sweets/Desserts Chocolate and  cocoa. Donuts. Peppermint and spearmint. Fats and Oils High-fat foods, including [place] fries and potato chips. Other Vinegar. Strong spices, such as black pepper, white pepper, red pepper, cayenne, curry powder, cloves, ginger, and chili powder. The items listed above may not be a complete list of foods and beverages to avoid. Contact your dietitian for more information.   This information is not intended to replace advice given to you by your health care provider. Make sure you discuss any questions you have with your health care provider.   Document Released: 07/22/2005 Document Revised: 08/12/2014 Document Reviewed: 05/26/2013 Elsevier Interactive Patient Education [employer].

## 2016-05-09 NOTE — Assessment & Plan Note (Signed)
Currently asymptomatic. Continue to monitor, follow up as needed.

## 2016-05-09 NOTE — Assessment & Plan Note (Signed)
Seems to be having intermittent mild GERD symptoms. Takes omeprazole 20 mg in the evening. Symptoms typically after eating breakfast which usually includes orange juice. Discussed avoiding trigger foods, avoid NSAIDs. He has been put on Celebrex as needed and it has subsequently stopped taking over-the-counter NSAIDs. I will increase his omeprazole to twice a day for now to see if this improves his symptoms in addition to NSAID and trigger avoidance. Return for follow-up in 3 months.

## 2016-05-09 NOTE — Progress Notes (Signed)
cc'ed to pcp °

## 2016-06-20 ENCOUNTER — Telehealth: Payer: Self-pay | Admitting: Internal Medicine

## 2016-06-20 NOTE — Telephone Encounter (Signed)
Patient called to say that he needed a 90 day supply of Omeprazole 20mg  sent to [facility]

## 2016-06-20 NOTE — Telephone Encounter (Signed)
Routing to the refill box. 

## 2016-06-21 MED ORDER — OMEPRAZOLE 20 MG PO CPDR: 20.0000 mg | DELAYED_RELEASE_CAPSULE | Freq: Two times a day (BID) | ORAL | 1 refills | Status: DC

## 2016-06-21 NOTE — Addendum Note (Signed)
Addended by: Mahala Menghini on: 06/21/2016 11:49 AM   Modules accepted: Orders

## 2016-06-21 NOTE — Addendum Note (Signed)
Addended by: Mahala Menghini on: 06/21/2016 08:38 AM   Modules accepted: Orders

## 2016-07-03 DIAGNOSIS — X32XXXD Exposure to sunlight, subsequent encounter: Secondary | ICD-10-CM | POA: Diagnosis not present

## 2016-07-03 DIAGNOSIS — L570 Actinic keratosis: Secondary | ICD-10-CM | POA: Diagnosis not present

## 2016-08-06 ENCOUNTER — Telehealth: Payer: Self-pay

## 2016-08-06 NOTE — Telephone Encounter (Signed)
Pt is aware and he had an appointment next Monday already with [facility].

## 2016-08-06 NOTE — Telephone Encounter (Signed)
Make sure he is taking Prilosec twice a day, 30 minutes before breakfast and dinner. Stick with bland foods for next 24-48 hours. Drink plenty of liquids to stay hydrated, urine clear. If diarrhea persists past 24 hours, check stool studies. Needs appt with Randall Hiss in next 1-2 weeks (he was seen last by Randall Hiss in October 2017), for consideration of EGD if continued refractory GERD.

## 2016-08-06 NOTE — Telephone Encounter (Signed)
Pt called this morning because on 07/29/16 he had some reflux really bad and took tums and then on 08/02/16 he had some pain from his hernia. He is having diarrhea and stomach is upset. He is only able to eat bland foods like oatmeal and eggs. He is unable to sleep also. He is wanting to be seen today but I told him that the first thing we have is Thursday and he wanting to know if there is something he can do now to help. Please advise

## 2016-08-12 ENCOUNTER — Ambulatory Visit (INDEPENDENT_AMBULATORY_CARE_PROVIDER_SITE_OTHER): Payer: Medicare Other | Admitting: Nurse Practitioner

## 2016-08-12 ENCOUNTER — Encounter: Payer: Self-pay | Admitting: Nurse Practitioner

## 2016-08-12 VITALS — BP 130/82 | HR 67 | Temp 97.7°F | Ht 71.0 in | Wt 191.0 lb

## 2016-08-12 DIAGNOSIS — K219 Gastro-esophageal reflux disease without esophagitis: Secondary | ICD-10-CM

## 2016-08-12 DIAGNOSIS — K222 Esophageal obstruction: Secondary | ICD-10-CM

## 2016-08-12 NOTE — Progress Notes (Signed)
Referring Provider: Redmond School, MD Primary Care Physician:  Glo Herring., MD Primary GI:  Dr. Gala Romney  Chief Complaint  Patient presents with  . Gastroesophageal Reflux    HPI:   Brent Wilkins is a 76 y.o. male who presents for follow-up on GERD. The patient was last seen in our office 05/09/2016 for GERD and history of Schatzki's ring. At that time he was doing well, no new pain but admitted some "discomfort" and nausea in the epigastric area which is worse in the morning after breakfast, including testing or juice, without other postprandial worsening. He was taking ibuprofen regularly but started Celebrex in the afternoon. He was recommended to increase Prilosec to twice a day, avoid trigger foods, avoid NSAID medications, return for follow-up in 3 months.  Today he states he's doing ok overall. Doubling up on PPI improved symptoms rapidly. However, on Christmas Eve he had significant reflux, Christmas day he had some diarrhea. A couple days later again had more symptoms and liquid antacid helped. Has been on a soft diet. In the last two days he has been able to eat more normally. He thinks he may have had a stomach bug. No dysphagia symptoms. Denies chest pain, dyspnea, dizziness, lightheadedness, syncope, near syncope. Denies any other upper or lower GI symptoms.   Past Medical History:  Diagnosis Date  . Arthritis   . Dysphagia   . Frequent urination at night   . GERD (gastroesophageal reflux disease)   . History of kidney stones   . HTN (hypertension)   . Hypothyroidism   . 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 Neuropathy (HCC)    Bilateral ankles  . Prostate cancer [facility]) 2010   Dr. Rosana Hoes  . Seasonal allergies   . Syncope  dx. 8 yrs ago    Past Surgical History:  Procedure Laterality Date  . APPENDECTOMY    . CATARACT EXTRACTION W/PHACO Left 03/27/2015   Procedure: CATARACT EXTRACTION PHACO AND INTRAOCULAR LENS PLACEMENT LEFT EYE CDE=11.73;  Surgeon: Tonny Branch, MD;  Location: [facility];  Service: Ophthalmology;  Laterality: Left;  . CATARACT EXTRACTION W/PHACO Right 03/30/2015   Procedure: CATARACT EXTRACTION PHACO AND INTRAOCULAR LENS PLACEMENT RIGHT EYE CDE=7.35;  Surgeon: Tonny Branch, MD;  Location: [facility];  Service: Ophthalmology;  Laterality: Right;  . CHOLECYSTECTOMY    . COLONOSCOPY  07/23/10   Dr. Vivi Ferns rectum, scattered pancolonic diverticula  . COLONOSCOPY  08/10/1999   internal hemorrhoids,inflammatory polyp  . ESOPHAGOGASTRODUODENOSCOPY  04/12/08   Prominent Schatzki's ring, erosive reflux esophagitis, multiple antral erosions, small hiatal hernia, reactive gastropathy, status post dilation with 103F  . ESOPHAGOGASTRODUODENOSCOPY  10/28/2011   Procedure: ESOPHAGOGASTRODUODENOSCOPY (EGD);  Surgeon: Daneil Dolin, MD;  [facility];  Service: Endoscopy;  Laterality: N/A;  1:30  . MALONEY DILATION  10/28/2011   Procedure: Venia Minks DILATION;  Surgeon: Daneil Dolin, MD;  [facility];  Service: Endoscopy;  Laterality: N/A;  . SAVORY DILATION  10/28/2011   Procedure: SAVORY DILATION;  Surgeon: Daneil Dolin, MD;  [facility];  Service: Endoscopy;  Laterality: N/A;  . TONSILLECTOMY    . TOTAL ANKLE ARTHROPLASTY Left 11/25/2013   Procedure: TOTAL ANKLE ARTHOPLASTY;  Surgeon: Wylene Simmer, MD;  Location: [place];  Service: Orthopedics;  Laterality: Left;  . TOTAL ANKLE ARTHROPLASTY Right 07/14/2014   dr hewitt  . TOTAL ANKLE ARTHROPLASTY Right 07/14/2014   Procedure: RIGHT TOTAL ANKLE ARTHOPLASTY;  Surgeon: Wylene Simmer, MD;  Location: [place];  Service: Orthopedics;  Laterality: Right;    Current Outpatient Prescriptions  Medication Sig Dispense Refill  . amLODipine (NORVASC) 5 MG tablet Take 1 tablet (5 mg total) by mouth daily. 90 tablet 3  . celecoxib (CELEBREX) 100 MG capsule Take 100 mg by mouth 2 (two) times daily.    Marland Kitchen ELIQUIS 5 MG TABS tablet TAKE 1 TABLET BY MOUTH TWO  TIMES DAILY 180 tablet 3  .  finasteride (PROSCAR) 5 MG tablet Take 5 mg by mouth daily.    . fluticasone (FLONASE) 50 MCG/ACT nasal spray Place 1 spray into both nostrils daily as needed for allergies.     Marland Kitchen gabapentin (NEURONTIN) 300 MG capsule Take 300 mg by mouth 2 (two) times daily.     Marland Kitchen levothyroxine (SYNTHROID, LEVOTHROID) 75 MCG tablet Take 75 mcg by mouth daily.  0  . Omega-3 Fatty Acids (RA FISH OIL) 1400 MG CPDR Take 1,400 mg by mouth daily.    Marland Kitchen omeprazole (PRILOSEC) 20 MG capsule Take 1 capsule (20 mg total) by mouth 2 (two) times daily before a meal. 180 capsule 1  . propranolol (INDERAL) 10 MG tablet Take 1 tablet (10 mg total) by mouth daily. 30 tablet 11  . Tamsulosin HCl (FLOMAX) 0.4 MG CAPS Take 0.4 mg by mouth daily after breakfast.     . vitamin B-12 (CYANOCOBALAMIN) 500 MCG tablet Take 500 mcg by mouth daily.     No current facility-administered medications for this visit.     Allergies as of 08/12/2016 - Review Complete 08/12/2016  Allergen Reaction Noted  . Oxycodone  07/06/2014  . Statins  05/10/2013  . Tetanus toxoids  05/10/2013  . Penicillins Swelling and Rash 10/22/2011  . Sulfa antibiotics Swelling and Rash 10/22/2011    Family History  Problem Relation Age of Onset  . Prostate cancer Father   . Prostate cancer Son 86  . Prostate cancer Brother   . Colon cancer Neg Hx     Social History   Social History  . Marital status: Married    Spouse name: N/A  . Number of children: 2  . Years of education: N/A   Occupational History  . retired; Nurse, learning disability Retired   Social History Main Topics  . Smoking status: Never Smoker  . Smokeless tobacco: Never Used  . Alcohol use 0.6 - 1.2 oz/week    1 - 2 Cans of beer per week     Comment: 2 beers a week  . Drug use: No  . Sexual activity: Not Asked   Other Topics Concern  . None   Social History Narrative  . None    Review of Systems: General: Negative for anorexia, fever, chills, fatigue, weakness. ENT: Negative  for hoarseness, difficulty swallowing. CV: Negative for chest pain, angina, palpitations, dyspnea on exertion, peripheral edema.  Respiratory: Negative for dyspnea at rest, dyspnea on exertion, cough, sputum, wheezing.  GI: See history of present illness. Endo: Negative for unusual weight change.  Heme: Negative for bruising or bleeding. Allergy: Negative for rash or hives.   Physical Exam: BP 130/82   Pulse 67   Temp 97.7 F (36.5 C) (Oral)   Ht 5\' 11"  (1.803 m)   Wt 191 lb (86.6 kg)   BMI 26.64 kg/m  General:   Alert and oriented. Pleasant and cooperative. Well-nourished and well-developed.  Ears:  Normal auditory acuity. Cardiovascular:  S1, S2 present without murmurs appreciated. Extremities without clubbing or edema. Respiratory:  Clear to  auscultation bilaterally. No wheezes, rales, or rhonchi. No distress.  Gastrointestinal:  +BS, soft, non-tender and non-distended. No HSM noted. No guarding or rebound. No masses appreciated.  Rectal:  Deferred  Musculoskalatal:  Symmetrical without gross deformities. Neurologic:  Alert and oriented x4;  grossly normal neurologically. Psych:  Alert and cooperative. Normal mood and affect. Heme/Lymph/Immune: No excessive bruising noted.    08/12/2016 10:30 AM   Disclaimer: This note was dictated with voice recognition software. Similar sounding words can inadvertently be transcribed and may not be corrected upon review.

## 2016-08-12 NOTE — Progress Notes (Signed)
CC'D TO PCP °

## 2016-08-12 NOTE — Assessment & Plan Note (Signed)
History of Schatzki's ring. Not currently having dysphagia symptoms. Continue to monitor. Return for follow-up in 2 months.

## 2016-08-12 NOTE — Assessment & Plan Note (Signed)
The patient has chronic history of GERD. He did well initially with double dose of Protonix (twice a day). He did have a couple "bumps in [place]" on Christmas likely related to lots of rich foods and trigger foods. He also likely picked up a stomach bug which seems to the going around given the recent weather. He did have nausea and diarrhea although this has resolved. He has a take over-the-counter liquid antacid to help with his breakthrough symptoms. Over the past couple days she seems to be returning to normal, eating normally. GERD symptoms again well controlled. I will keep him on twice a day dosing for another 2 months to get through the holidays and Winter weather. At this point we will see him back in the office and consider backing off to once a day dosing. Take over-the-counter antacid as needed for breakthrough symptoms. Call with any worsening or severe symptoms.

## 2016-08-12 NOTE — Patient Instructions (Signed)
1. Keep taking Prilosec twice a day. 2. Take over-the-counter antacid as needed for breakthrough symptoms. 3. Return for follow-up in 2 months. At this point we will consider if any needed upper endoscopy and to consider backing off to once a day Prilosec. 4. Call with any worsening or severe problems.

## 2016-09-18 ENCOUNTER — Other Ambulatory Visit: Payer: Self-pay | Admitting: Gastroenterology

## 2016-10-10 ENCOUNTER — Ambulatory Visit (INDEPENDENT_AMBULATORY_CARE_PROVIDER_SITE_OTHER): Payer: Medicare Other | Admitting: Nurse Practitioner

## 2016-10-10 ENCOUNTER — Encounter: Payer: Self-pay | Admitting: Nurse Practitioner

## 2016-10-10 VITALS — BP 171/81 | HR 66 | Temp 97.6°F | Ht 70.5 in | Wt 197.6 lb

## 2016-10-10 DIAGNOSIS — K219 Gastro-esophageal reflux disease without esophagitis: Secondary | ICD-10-CM | POA: Diagnosis not present

## 2016-10-10 NOTE — Patient Instructions (Signed)
1. Keep taking Prilosec (omeprazole) twice a day. 2. Return for follow-up in 6 months. 3. At that time we can consider cutting back to once a day. 4. Call if you have any worsening symptoms.

## 2016-10-10 NOTE — Progress Notes (Signed)
Referring Provider: Redmond School, MD Primary Care Physician:  Glo Herring., MD Primary GI:  Dr. Gala Romney  Chief Complaint  Patient presents with  . Gastroesophageal Reflux    f/u, doing ok, GERD is better since Omeprazole was inc to bid    HPI:   Brent Wilkins is a 76 y.o. male who presents for follow-up on GERD. The patient was last seen in our office 08/12/2016 for the same. At that time he was doing well overall, his symptoms rapidly improved with doubling his PPI dose to twice a day. Did have breakthrough symptoms at Christmas. Thinks he might of had a stomach bug around that time which caused some GI discomfort. Otherwise, asymptomatic from a GI standpoint. Decided to keep him on twice a day dosing for another 2 months and check back for possible tapering down to once a day dosing.  Today he states he's doing ok overall. Had another GI virus with vomiting and diarrhea. He states his GERD is doing well since bid dosing of omeprazole. Uses rare NSAIDs for arthritis but he avoids them if he can due to HERD symptoms. Otherwise, no breakthrough of GERD. Denies abdominal pain, N/V, diarrhea, melena, hematochezia. Denies chest pain, dyspnea, dizziness, lightheadedness, syncope, near syncope. Denies any other upper or lower GI symptoms.  Past Medical History:  Diagnosis Date  . Arthritis   . Dysphagia   . Frequent urination at night   . GERD (gastroesophageal reflux disease)   . History of kidney stones   . HTN (hypertension)   . Hypothyroidism   . 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 Neuropathy (HCC)    Bilateral ankles  . Prostate cancer [facility]) 2010   Dr. Rosana Hoes  . Seasonal allergies   . Syncope  dx. 8 yrs ago    Past Surgical History:  Procedure Laterality Date  . APPENDECTOMY    . CATARACT EXTRACTION W/PHACO Left 03/27/2015   Procedure: CATARACT EXTRACTION PHACO AND INTRAOCULAR LENS PLACEMENT LEFT EYE CDE=11.73;  Surgeon: Tonny Branch, MD;  [facility];  Service:  Ophthalmology;  Laterality: Left;  . CATARACT EXTRACTION W/PHACO Right 03/30/2015   Procedure: CATARACT EXTRACTION PHACO AND INTRAOCULAR LENS PLACEMENT RIGHT EYE CDE=7.35;  Surgeon: Tonny Branch, MD;  Location: [facility];  Service: Ophthalmology;  Laterality: Right;  . CHOLECYSTECTOMY    . COLONOSCOPY  07/23/10   Dr. Vivi Ferns rectum, scattered pancolonic diverticula  . COLONOSCOPY  08/10/1999   internal hemorrhoids,inflammatory polyp  . ESOPHAGOGASTRODUODENOSCOPY  04/12/08   Prominent Schatzki's ring, erosive reflux esophagitis, multiple antral erosions, small hiatal hernia, reactive gastropathy, status post dilation with 49F  . ESOPHAGOGASTRODUODENOSCOPY  10/28/2011   Procedure: ESOPHAGOGASTRODUODENOSCOPY (EGD);  Surgeon: Daneil Dolin, MD;  [facility];  Service: Endoscopy;  Laterality: N/A;  1:30  . MALONEY DILATION  10/28/2011   Procedure: Venia Minks DILATION;  Surgeon: Daneil Dolin, MD;  [facility];  Service: Endoscopy;  Laterality: N/A;  . SAVORY DILATION  10/28/2011   Procedure: SAVORY DILATION;  Surgeon: Daneil Dolin, MD;  [facility];  Service: Endoscopy;  Laterality: N/A;  . TONSILLECTOMY    . TOTAL ANKLE ARTHROPLASTY Left 11/25/2013   Procedure: TOTAL ANKLE ARTHOPLASTY;  Surgeon: Wylene Simmer, MD;  Location: [facility];  Service: Orthopedics;  Laterality: Left;  . TOTAL ANKLE ARTHROPLASTY Right 07/14/2014   dr hewitt  . TOTAL ANKLE ARTHROPLASTY Right 07/14/2014   Procedure: RIGHT TOTAL ANKLE ARTHOPLASTY;  Surgeon: Wylene Simmer, MD;  Location: [facility];  Service: Orthopedics;  Laterality: Right;    Current Outpatient Prescriptions  Medication Sig Dispense Refill  . amLODipine (NORVASC) 5 MG tablet Take 1 tablet (5 mg total) by mouth daily. 90 tablet 3  . celecoxib (CELEBREX) 100 MG capsule Take 100 mg by mouth as needed.     Marland Kitchen ELIQUIS 5 MG TABS tablet TAKE 1 TABLET BY MOUTH TWO  TIMES DAILY 180 tablet 3  . finasteride (PROSCAR) 5 MG tablet Take 5 mg by  mouth daily.    . fluticasone (FLONASE) 50 MCG/ACT nasal spray Place 1 spray into both nostrils daily as needed for allergies.     Marland Kitchen gabapentin (NEURONTIN) 300 MG capsule Take 300 mg by mouth 2 (two) times daily.     Marland Kitchen levothyroxine (SYNTHROID, LEVOTHROID) 75 MCG tablet Take 75 mcg by mouth daily.  0  . Omega-3 Fatty Acids (RA FISH OIL) 1400 MG CPDR Take 1,400 mg by mouth daily.    Marland Kitchen omeprazole (PRILOSEC) 20 MG capsule TAKE 1 CAPSULE BY MOUTH 2  TIMES DAILY BEFORE A MEAL 180 capsule 3  . Potassium 99 MG TABS Take 1 tablet by mouth daily. Takes only during the summer    . propranolol (INDERAL) 10 MG tablet Take 1 tablet (10 mg total) by mouth daily. 30 tablet 11  . Tamsulosin HCl (FLOMAX) 0.4 MG CAPS Take 0.4 mg by mouth daily after breakfast.     . vitamin B-12 (CYANOCOBALAMIN) 500 MCG tablet Take 500 mcg by mouth daily.     No current facility-administered medications for this visit.     Allergies as of 10/10/2016 - Review Complete 10/10/2016  Allergen Reaction Noted  . Oxycodone  07/06/2014  . Statins  05/10/2013  . Tetanus toxoids  05/10/2013  . Penicillins Swelling and Rash 10/22/2011  . Sulfa antibiotics Swelling and Rash 10/22/2011    Family History  Problem Relation Age of Onset  . Prostate cancer Father   . Prostate cancer Son 12  . Prostate cancer Brother   . Colon cancer Neg Hx     Social History   Social History  . Marital status: Married    Spouse name: N/A  . Number of children: 2  . Years of education: N/A   Occupational History  . retired; Nurse, learning disability Retired   Social History Main Topics  . Smoking status: Never Smoker  . Smokeless tobacco: Never Used  . Alcohol use 0.6 - 1.2 oz/week    1 - 2 Cans of beer per week     Comment: 2 beers a week  . Drug use: No  . Sexual activity: Not Asked   Other Topics Concern  . None   Social History Narrative  . None    Review of Systems: General: Negative for anorexia, weight loss, fever, chills,  fatigue, weakness. ENT: Negative for hoarseness, difficulty swallowing. CV: Negative for chest pain, angina, palpitations, peripheral edema.  Respiratory: Negative for dyspnea at rest, cough, sputum, wheezing.  GI: See history of present illness. Endo: Negative for unusual weight change.  Heme: Negative for bruising or bleeding.   Physical Exam: BP (!) 171/81   Pulse 66   Temp 97.6 F (36.4 C) (Oral)   Ht 5' 10.5" (1.791 m)   Wt 197 lb 9.6 oz (89.6 kg)   BMI 27.95 kg/m  General:   Alert and oriented. Pleasant and cooperative. Well-nourished and well-developed.  Ears:  Normal auditory acuity. Cardiovascular:  S1, S2 present without murmurs appreciated. Extremities  without clubbing or edema. Respiratory:  Clear to auscultation bilaterally. No wheezes, rales, or rhonchi. No distress.  Gastrointestinal:  +BS, soft, non-tender and non-distended. No HSM noted. No guarding or rebound. No masses appreciated.  Rectal:  Deferred  Musculoskalatal:  Symmetrical without gross deformities. Neurologic:  Alert and oriented x4;  grossly normal neurologically. Psych:  Alert and cooperative. Normal mood and affect. Heme/Lymph/Immune: No excessive bruising noted.    10/10/2016 10:28 AM   Disclaimer: This note was dictated with voice recognition software. Similar sounding words can inadvertently be transcribed and may not be corrected upon review.

## 2016-10-10 NOTE — Assessment & Plan Note (Signed)
Symptoms are significantly improved on twice a day dosing. At this point we will leave him on twice a day omeprazole. Return for follow-up in 6 months. Consider dilating back to once a day at that time. Call with any worsening symptoms.

## 2016-10-10 NOTE — Progress Notes (Signed)
cc'ed to pcp °

## 2016-10-25 DIAGNOSIS — N401 Enlarged prostate with lower urinary tract symptoms: Secondary | ICD-10-CM | POA: Diagnosis not present

## 2016-10-25 DIAGNOSIS — N138 Other obstructive and reflux uropathy: Secondary | ICD-10-CM | POA: Diagnosis not present

## 2016-10-25 DIAGNOSIS — R972 Elevated prostate specific antigen [PSA]: Secondary | ICD-10-CM | POA: Diagnosis not present

## 2016-11-08 DIAGNOSIS — I4891 Unspecified atrial fibrillation: Secondary | ICD-10-CM | POA: Diagnosis not present

## 2016-11-08 DIAGNOSIS — I739 Peripheral vascular disease, unspecified: Secondary | ICD-10-CM | POA: Diagnosis not present

## 2016-11-08 DIAGNOSIS — I10 Essential (primary) hypertension: Secondary | ICD-10-CM | POA: Diagnosis not present

## 2016-11-08 DIAGNOSIS — C61 Malignant neoplasm of prostate: Secondary | ICD-10-CM | POA: Diagnosis not present

## 2016-11-08 DIAGNOSIS — J329 Chronic sinusitis, unspecified: Secondary | ICD-10-CM | POA: Diagnosis not present

## 2016-11-08 DIAGNOSIS — J309 Allergic rhinitis, unspecified: Secondary | ICD-10-CM | POA: Diagnosis not present

## 2016-11-08 DIAGNOSIS — Z1389 Encounter for screening for other disorder: Secondary | ICD-10-CM | POA: Diagnosis not present

## 2016-11-14 ENCOUNTER — Ambulatory Visit (INDEPENDENT_AMBULATORY_CARE_PROVIDER_SITE_OTHER): Payer: Medicare Other | Admitting: Adult Health

## 2016-11-14 ENCOUNTER — Encounter: Payer: Self-pay | Admitting: Adult Health

## 2016-11-14 VITALS — BP 136/86 | HR 56 | Ht 70.0 in | Wt 197.0 lb

## 2016-11-14 DIAGNOSIS — I739 Peripheral vascular disease, unspecified: Secondary | ICD-10-CM | POA: Diagnosis not present

## 2016-11-14 DIAGNOSIS — E7800 Pure hypercholesterolemia, unspecified: Secondary | ICD-10-CM | POA: Diagnosis not present

## 2016-11-14 DIAGNOSIS — I10 Essential (primary) hypertension: Secondary | ICD-10-CM

## 2016-11-14 DIAGNOSIS — I4891 Unspecified atrial fibrillation: Secondary | ICD-10-CM

## 2016-11-14 NOTE — Progress Notes (Signed)
Name: Brent Wilkins    DOB: Jan 13, 1941  Age: 76 y.o.  MR#: [id number]       PCP:  Glo Herring, MD      Insurance: Payor: Onnie Boer MEDICARE / Plan: [facility] MEDICARE / Product Type: *No Product type* /   CC:    Chief Complaint  Patient presents with  . Atrial Fibrillation    Paroxysmal    VS Vitals:   11/14/16 1329  BP: 136/86  Pulse: (!) 56  SpO2: 98%  Weight: 197 lb (89.4 kg)  Height: 5\' 10"  (1.778 m)    Weights Current Weight  11/14/16 197 lb (89.4 kg)  10/10/16 197 lb 9.6 oz (89.6 kg)  08/12/16 191 lb (86.6 kg)    Blood Pressure  BP Readings from Last 3 Encounters:  11/14/16 136/86  10/10/16 (!) 171/81  08/12/16 130/82     Admit date:  (Not on file) Last encounter with RMR:  Visit date not found   Allergy Oxycodone; Statins; Tetanus toxoids; Penicillins; and Sulfa antibiotics  Current Outpatient Prescriptions  Medication Sig Dispense Refill  . amLODipine (NORVASC) 5 MG tablet Take 1 tablet (5 mg total) by mouth daily. 90 tablet 3  . celecoxib (CELEBREX) 100 MG capsule Take 100 mg by mouth as needed.     Marland Kitchen ELIQUIS 5 MG TABS tablet TAKE 1 TABLET BY MOUTH TWO  TIMES DAILY 180 tablet 3  . finasteride (PROSCAR) 5 MG tablet Take 5 mg by mouth daily.    . fluticasone (FLONASE) 50 MCG/ACT nasal spray Place 1 spray into both nostrils daily as needed for allergies.     Marland Kitchen gabapentin (NEURONTIN) 300 MG capsule Take 300 mg by mouth 2 (two) times daily.     Marland Kitchen levothyroxine (SYNTHROID, LEVOTHROID) 75 MCG tablet Take 75 mcg by mouth daily.  0  . Omega-3 Fatty Acids (RA FISH OIL) 1400 MG CPDR Take 1,400 mg by mouth daily.    Marland Kitchen omeprazole (PRILOSEC) 20 MG capsule TAKE 1 CAPSULE BY MOUTH 2  TIMES DAILY BEFORE A MEAL 180 capsule 3  . Potassium 99 MG TABS Take 1 tablet by mouth daily. Takes only during the summer    . propranolol (INDERAL) 10 MG tablet Take 1 tablet (10 mg total) by mouth daily. 30 tablet 11  . Tamsulosin HCl (FLOMAX) 0.4 MG CAPS Take 0.4 mg by mouth daily  after breakfast.     . vitamin B-12 (CYANOCOBALAMIN) 500 MCG tablet Take 500 mcg by mouth daily.     No current facility-administered medications for this visit.     Discontinued Meds:   There are no discontinued medications.  Patient Active Problem List   Diagnosis Date Noted  . Dizziness 12/13/2015  . Atrial fibrillation ([place]) 06/05/2015  . Essential hypertension 06/05/2015  . 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 Arthritis of left ankle 11/25/2013  . Dysphagia 10/22/2011  . GERD (gastroesophageal reflux disease) 10/22/2011  . Schatzki's ring 10/22/2011    LABS    Component Value Date/Time   NA 141 12/13/2015 1309   NA 142 05/22/2015 1056   NA 140 03/22/2015 1500   K 4.5 12/13/2015 1309   K 4.3 05/22/2015 1056   K 4.7 03/22/2015 1500   CL 106 12/13/2015 1309   CL 107 05/22/2015 1056   CL 106 03/22/2015 1500   CO2 29 12/13/2015 1309   CO2 27 05/22/2015 1056   CO2 26 03/22/2015 1500   GLUCOSE 89 12/13/2015 1309   GLUCOSE 111 (H) 05/22/2015  1056   GLUCOSE 92 03/22/2015 1500   BUN 20 12/13/2015 1309   BUN 15 05/22/2015 1056   BUN 23 (H) 03/22/2015 1500   CREATININE 1.15 12/13/2015 1309   CREATININE 0.99 05/22/2015 1056   CREATININE 1.20 03/22/2015 1500   CREATININE 1.06 07/06/2014 1032   CALCIUM 9.6 12/13/2015 1309   CALCIUM 9.1 05/22/2015 1056   CALCIUM 9.3 03/22/2015 1500   GFRNONAA 60 (L) 12/13/2015 1309   GFRNONAA 57 (L) 03/22/2015 1500   GFRNONAA 67 (L) 07/06/2014 1032   GFRAA >60 12/13/2015 1309   GFRAA >60 03/22/2015 1500   GFRAA 77 (L) 07/06/2014 1032   CMP     Component Value Date/Time   NA 141 12/13/2015 1309   K 4.5 12/13/2015 1309   CL 106 12/13/2015 1309   CO2 29 12/13/2015 1309   GLUCOSE 89 12/13/2015 1309   BUN 20 12/13/2015 1309   CREATININE 1.15 12/13/2015 1309   CREATININE 0.99 05/22/2015 1056   CALCIUM 9.6 12/13/2015 1309   GFRNONAA 60 (L) 12/13/2015 1309   GFRAA >60 12/13/2015 1309       Component Value Date/Time    WBC 7.8 12/13/2015 1309   WBC 6.6 05/22/2015 1056   WBC 8.0 03/22/2015 1500   HGB 15.9 12/13/2015 1309   HGB 15.1 05/22/2015 1056   HGB 15.1 03/22/2015 1500   HCT 46.1 12/13/2015 1309   HCT 45.2 05/22/2015 1056   HCT 43.8 03/22/2015 1500   MCV 93.7 12/13/2015 1309   MCV 95.8 05/22/2015 1056   MCV 92.2 03/22/2015 1500    Lipid Panel  No results found for: CHOL, TRIG, HDL, CHOLHDL, VLDL, LDLCALC, LDLDIRECT  ABG No results found for: PHART, PCO2ART, PO2ART, HCO3, TCO2, ACIDBASEDEF, O2SAT   Lab Results  Component Value Date   TSH 1.661 12/13/2015   BNP (last 3 results) No results for input(s): BNP in the last 8760 hours.  ProBNP (last 3 results) No results for input(s): PROBNP in the last 8760 hours.  Cardiac Panel (last 3 results) No results for input(s): CKTOTAL, CKMB, TROPONINI, RELINDX in the last 72 hours.  Iron/TIBC/Ferritin/ %Sat No results found for: IRON, TIBC, FERRITIN, IRONPCTSAT   EKG Orders placed or performed in visit on 12/13/15  . EKG 12-Lead     Prior Assessment and Plan Problem List as of 11/14/2016 Reviewed: 10/10/2016 10:28 AM by Ranae Pila, NP     Cardiovascular and Mediastinum   Atrial fibrillation [facility])   Essential hypertension     Digestive   Dysphagia   Last Assessment & Plan 10/22/2011 Office Visit Written 10/22/2011  1:39 PM by Andria Meuse, NP    Elyn Aquas Miyasato is a pleasant 76 y.o. male history of erosive reflux esophagitis, chronic gastritis, Schatzki's ring presents with recurrent dysphagia.  He is on Relafen twice daily. He previously had gastritis, most likely NSAID-induced.  Unfortunately, he has not been compliant with daily PPI.  EGD with Dr. Gala Romney with possible esophageal dilation.  I have discussed risks & benefits which include, but are not limited to, bleeding, infection, perforation & drug reaction.  The patient agrees with this plan & written consent will be obtained.   Resume omeprazole 20 mg daily        GERD  (gastroesophageal reflux disease)   Last Assessment & Plan 10/10/2016 Office Visit Written 10/10/2016 10:37 AM by Carlis Stable, NP    Symptoms are significantly improved on twice a day dosing. At this point we will leave him on twice  a day omeprazole. Return for follow-up in 6 months. Consider dilating back to once a day at that time. Call with any worsening symptoms.      Schatzki's ring   Last Assessment & Plan 08/12/2016 Office Visit Written 08/12/2016 10:28 AM by Carlis Stable, NP    History of Schatzki's ring. Not currently having dysphagia symptoms. Continue to monitor. Return for follow-up in 2 months.        Musculoskeletal and Integument   Arthritis of left ankle   Post-traumatic arthritis of ankle     Other   Dizziness       Imaging: No results found.

## 2016-11-14 NOTE — Progress Notes (Signed)
Cardiology Office Note   Date:  11/14/2016   ID:  Brent, Wilkins 25-Aug-1940, MRN [id number]  PCP:  Glo Herring, MD  Cardiologist:  Ross/ Jory Sims, NP   Chief Complaint  Patient presents with  . Atrial Fibrillation    Paroxysmal      History of Present Illness: Brent Wilkins is a 76 y.o. male who presents for ongoing assessment and management of paroxysmal atrial fibrillation, hyperlipidemia, and history of syncope with positive tilt table. The patient was last seen in the office in July 2017, continued on [place]. There were no changes in medication regimen. He was advised to stay hydrated. If he began to have some dizziness and near-syncope amlodipine was to be decreased to 2.5 mg. He is also followed by GI for GERD.  He comes today without any cardiac complaint. He is medically compliant. He does have a concern about his lower extremity pulses. He states he has a home health nurse that came by and noticed diminished pulses in the right ankle and foot. They did a Doppler evaluation with report sent to his primary care physician. His primary care physician has not yet followed up on further testing. He denies any pain in the right foot and ankle other than arthritis pain. He denies any weakness or coldness.  Past Medical History:  Diagnosis Date  . Arthritis   . Dysphagia   . Frequent urination at night   . GERD (gastroesophageal reflux disease)   . History of kidney stones   . HTN (hypertension)   . Hypothyroidism   . 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 Neuropathy (HCC)    Bilateral ankles  . Prostate cancer [facility]) 2010   Dr. Rosana Hoes  . Seasonal allergies   . Syncope  dx. 8 yrs ago    Past Surgical History:  Procedure Laterality Date  . APPENDECTOMY    . CATARACT EXTRACTION W/PHACO Left 03/27/2015   Procedure: CATARACT EXTRACTION PHACO AND INTRAOCULAR LENS PLACEMENT LEFT EYE CDE=11.73;  Surgeon: Tonny Branch, MD;  [facility];  Service: Ophthalmology;   Laterality: Left;  . CATARACT EXTRACTION W/PHACO Right 03/30/2015   Procedure: CATARACT EXTRACTION PHACO AND INTRAOCULAR LENS PLACEMENT RIGHT EYE CDE=7.35;  Surgeon: Tonny Branch, MD;  [facility];  Service: Ophthalmology;  Laterality: Right;  . CHOLECYSTECTOMY    . COLONOSCOPY  07/23/10   Dr. Vivi Ferns rectum, scattered pancolonic diverticula  . COLONOSCOPY  08/10/1999   internal hemorrhoids,inflammatory polyp  . ESOPHAGOGASTRODUODENOSCOPY  04/12/08   Prominent Schatzki's ring, erosive reflux esophagitis, multiple antral erosions, small hiatal hernia, reactive gastropathy, status post dilation with 78F  . ESOPHAGOGASTRODUODENOSCOPY  10/28/2011   Procedure: ESOPHAGOGASTRODUODENOSCOPY (EGD);  Surgeon: Daneil Dolin, MD;  [facility];  Service: Endoscopy;  Laterality: N/A;  1:30  . MALONEY DILATION  10/28/2011   Procedure: Venia Minks DILATION;  Surgeon: Daneil Dolin, MD;  [facility];  Service: Endoscopy;  Laterality: N/A;  . SAVORY DILATION  10/28/2011   Procedure: SAVORY DILATION;  Surgeon: Daneil Dolin, MD;  [facility];  Service: Endoscopy;  Laterality: N/A;  . TONSILLECTOMY    . TOTAL ANKLE ARTHROPLASTY Left 11/25/2013   Procedure: TOTAL ANKLE ARTHOPLASTY;  Surgeon: Wylene Simmer, MD;  Location: [place];  Service: Orthopedics;  Laterality: Left;  . TOTAL ANKLE ARTHROPLASTY Right 07/14/2014   dr hewitt  . TOTAL ANKLE ARTHROPLASTY Right 07/14/2014   Procedure: RIGHT TOTAL ANKLE ARTHOPLASTY;  Surgeon: Wylene Simmer,  MD;  Location: [place];  Service: Orthopedics;  Laterality: Right;     Current Outpatient Prescriptions  Medication Sig Dispense Refill  . amLODipine (NORVASC) 5 MG tablet Take 1 tablet (5 mg total) by mouth daily. 90 tablet 3  . celecoxib (CELEBREX) 100 MG capsule Take 100 mg by mouth as needed.     Marland Kitchen ELIQUIS 5 MG TABS tablet TAKE 1 TABLET BY MOUTH TWO  TIMES DAILY 180 tablet 3  . finasteride (PROSCAR) 5 MG tablet Take 5 mg by mouth daily.    .  fluticasone (FLONASE) 50 MCG/ACT nasal spray Place 1 spray into both nostrils daily as needed for allergies.     Marland Kitchen gabapentin (NEURONTIN) 300 MG capsule Take 300 mg by mouth 2 (two) times daily.     Marland Kitchen levothyroxine (SYNTHROID, LEVOTHROID) 75 MCG tablet Take 75 mcg by mouth daily.  0  . Omega-3 Fatty Acids (RA FISH OIL) 1400 MG CPDR Take 1,400 mg by mouth daily.    Marland Kitchen omeprazole (PRILOSEC) 20 MG capsule TAKE 1 CAPSULE BY MOUTH 2  TIMES DAILY BEFORE A MEAL 180 capsule 3  . Potassium 99 MG TABS Take 1 tablet by mouth daily. Takes only during the summer    . propranolol (INDERAL) 10 MG tablet Take 1 tablet (10 mg total) by mouth daily. 30 tablet 11  . Tamsulosin HCl (FLOMAX) 0.4 MG CAPS Take 0.4 mg by mouth daily after breakfast.     . vitamin B-12 (CYANOCOBALAMIN) 500 MCG tablet Take 500 mcg by mouth daily.     No current facility-administered medications for this visit.     Allergies:   Oxycodone; Statins; Tetanus toxoids; Penicillins; and Sulfa antibiotics    Social History:  The patient  reports that he has never smoked. He has never used smokeless tobacco. He reports that he drinks about 0.6 - 1.2 oz of alcohol per week . He reports that he does not use drugs.   Family History:  The patient's family history includes Prostate cancer in his brother and father; Prostate cancer (age of onset: 67) in his son.    ROS: All other systems are reviewed and negative. Unless otherwise mentioned in H&P    PHYSICAL EXAM: VS:  BP 136/86   Pulse (!) 56   Ht 5\' 10"  (1.778 m)   Wt 197 lb (89.4 kg)   SpO2 98%   BMI 28.27 kg/m  , BMI Body mass index is 28.27 kg/m. GEN: Well nourished, well developed, in no acute distress  HEENT: normal  Neck: no JVD, carotid bruits, or masses Cardiac: RRR; no murmurs, rubs, or gallops,no edema  Respiratory:  clear to auscultation bilaterally, normal work of breathing GI: soft, nontender, nondistended, + BS MS: no deformity or atrophy  Skin: warm and dry, no  rash Neuro:  Strength and sensation are intact Psych: euthymic mood, full affect   Recent Labs: 12/13/2015: BUN 20; Creatinine, Ser 1.15; Hemoglobin 15.9; Platelets 194; Potassium 4.5; Sodium 141; TSH 1.661    Lipid Panel No results found for: CHOL, TRIG, HDL, CHOLHDL, VLDL, LDLCALC, LDLDIRECT    Wt Readings from Last 3 Encounters:  11/14/16 197 lb (89.4 kg)  10/10/16 197 lb 9.6 oz (89.6 kg)  08/12/16 191 lb (86.6 kg)      Other studies Reviewed: Echocardiogram April 28, 2017 Left ventricle: The cavity size was normal. Wall thickness was   increased increased in a pattern of mild to moderate LVH.   Systolic function was normal. The estimated ejection fraction was  in the range of 60% to 65%. Wall motion was normal; there were no   regional wall motion abnormalities. - Aortic valve: Valve area (VTI): 2.18 cm^2. Valve area (Vmax):   2.13 cm^2. - Mitral valve: There was mild regurgitation. - Left atrium: The atrium was severely dilated. - Right ventricle: The cavity size was mildly dilated. - Right atrium: The atrium was severely dilated. - Atrial septum: No defect or patent foramen ovale was identified. - Pulmonary arteries: Systolic pressure was mildly increased. PA   peak pressure: 34 mm Hg (S).  ASSESSMENT AND PLAN:  1.  Paroxysmal Atrial fibrillation: Heart rate is well controlled currently on medication regimen. He will continue ELIQUIS 5 mg twice a day and propanolol 10 mg daily. I will order a CBC to evaluate for evidence of anemia on DOac.   2. Hypertension: Blood pressure is well controlled currently. He is concerned about taking NSAIDs for his arthritis pain. This does not appear to be affecting his blood pressure at this time. I will check a BMET to evaluate for kidney function. He will continue amlodipine as directed. He is advised to take NSAIDs with food.  3. Questionable peripheral arterial disease: He does have risk factor of hypertension and diabetes. I have given  the patient time to follow-up with his primary care physician concerning further testing. He does have diminished pulses in his right ankle. We'll plan ABI if he he does not hear from PCP at the first of next week. He is supposed to call us and send [place] a copy of the home health Doppler study.  4. Hypercholesterolemia: He has not had recent labs completed. He is intolerant of statins. Will check lipids and LFTs, continue fish oil.  5. Hypothyroidism: We'll add TSH to labs with copies to primary care   Current medicines are reviewed at length with the patient today.    Labs/ tests ordered today include:   Orders Placed This Encounter  Procedures  . CBC with Differential  . Basic Metabolic Panel (BMET)  . Hepatic function panel  . Lipid Profile  . TSH     Disposition:   FU with 6 months with cardiology  Signed, Jory Sims, NP  11/14/2016 2:03 PM    [facility] [address] [place], [place] [address] Phone: [phone]; Fax: [phone]

## 2016-11-14 NOTE — Patient Instructions (Addendum)
Your physician wants you to follow-up in: 6 Months with Dr. Harrington Challenger.  You will receive a reminder letter in the mail two months in advance. If you don't receive a letter, please call our office to schedule the follow-up appointment.  Your physician recommends that you continue on your current medications as directed. Please refer to the Current Medication list given to you today.  Your physician recommends that you return for lab work in: Fasting  If you need a refill on your cardiac medications before your next appointment, please call your pharmacy.  Thank you for choosing [facility]!

## 2016-11-15 LAB — CBC WITH DIFFERENTIAL/PLATELET
BASOS PCT: 0 %
Basophils Absolute: 0 cells/uL (ref 0–200)
EOS ABS: 375 {cells}/uL (ref 15–500)
EOS PCT: 5 %
HCT: 47.5 % (ref 38.5–50.0)
Hemoglobin: 16.2 g/dL (ref 13.2–17.1)
LYMPHS PCT: 34 %
Lymphs Abs: 2550 cells/uL (ref 850–3900)
MCH: 31.2 pg (ref 27.0–33.0)
MCHC: 34.1 g/dL (ref 32.0–36.0)
MCV: 91.5 fL (ref 80.0–100.0)
MONOS PCT: 6 %
MPV: 9 fL (ref 7.5–12.5)
Monocytes Absolute: 450 cells/uL (ref 200–950)
NEUTROS ABS: 4125 {cells}/uL (ref 1500–7800)
Neutrophils Relative %: 55 %
PLATELETS: 256 10*3/uL (ref 140–400)
RBC: 5.19 MIL/uL (ref 4.20–5.80)
RDW: 14.3 % (ref 11.0–15.0)
WBC: 7.5 10*3/uL (ref 3.8–10.8)

## 2016-11-15 LAB — BASIC METABOLIC PANEL
BUN: 16 mg/dL (ref 7–25)
CHLORIDE: 106 mmol/L (ref 98–110)
CO2: 27 mmol/L (ref 20–31)
CREATININE: 1.14 mg/dL (ref 0.70–1.18)
Calcium: 9.3 mg/dL (ref 8.6–10.3)
Glucose, Bld: 99 mg/dL (ref 65–99)
POTASSIUM: 4.4 mmol/L (ref 3.5–5.3)
SODIUM: 142 mmol/L (ref 135–146)

## 2016-11-15 LAB — LIPID PANEL
Cholesterol: 141 mg/dL (ref <200)
HDL: 50 mg/dL (ref >40)
LDL CALC: 74 mg/dL (ref <100)
TRIGLYCERIDES: 85 mg/dL (ref <150)
Total CHOL/HDL Ratio: 2.8 Ratio (ref <5.0)
VLDL: 17 mg/dL (ref <30)

## 2016-11-15 LAB — HEPATIC FUNCTION PANEL
ALK PHOS: 72 U/L (ref 40–115)
ALT: 7 U/L — AB (ref 9–46)
AST: 19 U/L (ref 10–35)
Albumin: 3.9 g/dL (ref 3.6–5.1)
BILIRUBIN DIRECT: 0.2 mg/dL (ref <=0.2)
Indirect Bilirubin: 0.7 mg/dL (ref 0.2–1.2)
Total Bilirubin: 0.9 mg/dL (ref 0.2–1.2)
Total Protein: 6.7 g/dL (ref 6.1–8.1)

## 2016-11-21 ENCOUNTER — Other Ambulatory Visit: Payer: Self-pay | Admitting: *Deleted

## 2016-11-21 ENCOUNTER — Telehealth: Payer: Self-pay | Admitting: Adult Health

## 2016-11-21 DIAGNOSIS — R0989 Other specified symptoms and signs involving the circulatory and respiratory systems: Secondary | ICD-10-CM

## 2016-11-21 NOTE — Telephone Encounter (Signed)
Per pt phone call--would like to go ahead and scheduled the testing that he and K. Lawrence discussed at his last visit.

## 2016-11-21 NOTE — Telephone Encounter (Signed)
Order placed

## 2016-11-27 ENCOUNTER — Ambulatory Visit: Payer: Medicare Other | Admitting: Nurse Practitioner

## 2016-11-28 ENCOUNTER — Other Ambulatory Visit: Payer: Self-pay

## 2016-11-28 MED ORDER — PROPRANOLOL HCL 10 MG PO TABS: 10.0000 mg | ORAL_TABLET | Freq: Every day | ORAL | 3 refills | Status: DC

## 2016-11-28 NOTE — Telephone Encounter (Signed)
propranolol refilled per fax request from [facility]

## 2016-12-04 ENCOUNTER — Ambulatory Visit (HOSPITAL_COMMUNITY)
Admission: RE | Admit: 2016-12-04 | Discharge: 2016-12-04 | Disposition: A | Discharge status: Home / Self Care | Payer: Medicare Other | Source: Ambulatory Visit | Attending: Adult Health | Admitting: Adult Health

## 2016-12-04 DIAGNOSIS — R0989 Other specified symptoms and signs involving the circulatory and respiratory systems: Secondary | ICD-10-CM

## 2016-12-04 DIAGNOSIS — Z0389 Encounter for observation for other suspected diseases and conditions ruled out: Secondary | ICD-10-CM | POA: Diagnosis not present

## 2017-01-01 DIAGNOSIS — S80861A Insect bite (nonvenomous), right lower leg, initial encounter: Secondary | ICD-10-CM | POA: Diagnosis not present

## 2017-01-01 DIAGNOSIS — X32XXXD Exposure to sunlight, subsequent encounter: Secondary | ICD-10-CM | POA: Diagnosis not present

## 2017-01-01 DIAGNOSIS — D225 Melanocytic nevi of trunk: Secondary | ICD-10-CM | POA: Diagnosis not present

## 2017-01-01 DIAGNOSIS — L570 Actinic keratosis: Secondary | ICD-10-CM | POA: Diagnosis not present

## 2017-01-10 DIAGNOSIS — H26491 Other secondary cataract, right eye: Secondary | ICD-10-CM | POA: Diagnosis not present

## 2017-01-20 DIAGNOSIS — H40013 Open angle with borderline findings, low risk, bilateral: Secondary | ICD-10-CM | POA: Diagnosis not present

## 2017-01-20 DIAGNOSIS — H26493 Other secondary cataract, bilateral: Secondary | ICD-10-CM | POA: Diagnosis not present

## 2017-02-27 ENCOUNTER — Other Ambulatory Visit: Payer: Self-pay

## 2017-02-27 MED ORDER — PROPRANOLOL HCL 10 MG PO TABS: 10.0000 mg | ORAL_TABLET | Freq: Every day | ORAL | 3 refills | Status: DC

## 2017-03-20 ENCOUNTER — Emergency Department (HOSPITAL_COMMUNITY): Payer: Medicare Other

## 2017-03-20 ENCOUNTER — Observation Stay (HOSPITAL_COMMUNITY)
Admission: EM | Admit: 2017-03-20 | Discharge: 2017-03-22 | Disposition: A | Discharge status: Home / Self Care | Payer: Medicare Other | Attending: Internal Medicine | Admitting: Internal Medicine

## 2017-03-20 ENCOUNTER — Encounter (HOSPITAL_COMMUNITY): Payer: Self-pay | Admitting: *Deleted

## 2017-03-20 DIAGNOSIS — S098XXA Other specified injuries of head, initial encounter: Secondary | ICD-10-CM | POA: Diagnosis not present

## 2017-03-20 DIAGNOSIS — Z96662 Presence of left artificial ankle joint: Secondary | ICD-10-CM | POA: Insufficient documentation

## 2017-03-20 DIAGNOSIS — S0190XA Unspecified open wound of unspecified part of head, initial encounter: Secondary | ICD-10-CM | POA: Diagnosis not present

## 2017-03-20 DIAGNOSIS — I480 Paroxysmal atrial fibrillation: Secondary | ICD-10-CM | POA: Insufficient documentation

## 2017-03-20 DIAGNOSIS — R55 Syncope and collapse: Principal | ICD-10-CM | POA: Insufficient documentation

## 2017-03-20 DIAGNOSIS — Z96661 Presence of right artificial ankle joint: Secondary | ICD-10-CM | POA: Diagnosis not present

## 2017-03-20 DIAGNOSIS — E039 Hypothyroidism, unspecified: Secondary | ICD-10-CM | POA: Diagnosis not present

## 2017-03-20 DIAGNOSIS — Y9389 Activity, other specified: Secondary | ICD-10-CM | POA: Insufficient documentation

## 2017-03-20 DIAGNOSIS — I4891 Unspecified atrial fibrillation: Secondary | ICD-10-CM | POA: Insufficient documentation

## 2017-03-20 DIAGNOSIS — Y990 Civilian activity done for income or pay: Secondary | ICD-10-CM | POA: Diagnosis not present

## 2017-03-20 DIAGNOSIS — N182 Chronic kidney disease, stage 2 (mild): Secondary | ICD-10-CM

## 2017-03-20 DIAGNOSIS — Z7901 Long term (current) use of anticoagulants: Secondary | ICD-10-CM | POA: Diagnosis not present

## 2017-03-20 DIAGNOSIS — Z79899 Other long term (current) drug therapy: Secondary | ICD-10-CM | POA: Diagnosis not present

## 2017-03-20 DIAGNOSIS — W01198A Fall on same level from slipping, tripping and stumbling with subsequent striking against other object, initial encounter: Secondary | ICD-10-CM | POA: Diagnosis not present

## 2017-03-20 DIAGNOSIS — S0003XA Contusion of scalp, initial encounter: Secondary | ICD-10-CM

## 2017-03-20 DIAGNOSIS — Y929 Unspecified place or not applicable: Secondary | ICD-10-CM | POA: Diagnosis not present

## 2017-03-20 DIAGNOSIS — S0101XA Laceration without foreign body of scalp, initial encounter: Secondary | ICD-10-CM | POA: Diagnosis not present

## 2017-03-20 DIAGNOSIS — I10 Essential (primary) hypertension: Secondary | ICD-10-CM | POA: Insufficient documentation

## 2017-03-20 DIAGNOSIS — S0990XA Unspecified injury of head, initial encounter: Secondary | ICD-10-CM | POA: Diagnosis not present

## 2017-03-20 HISTORY — DX: Syncope and collapse: R55

## 2017-03-20 LAB — BASIC METABOLIC PANEL
Anion gap: 9 (ref 5–15)
BUN: 20 mg/dL (ref 6–20)
CHLORIDE: 107 mmol/L (ref 101–111)
CO2: 24 mmol/L (ref 22–32)
CREATININE: 1.22 mg/dL (ref 0.61–1.24)
Calcium: 9.2 mg/dL (ref 8.9–10.3)
GFR calc Af Amer: >60 mL/min (ref >60)
GFR calc non Af Amer: 55 mL/min — ABNORMAL LOW (ref >60)
Glucose, Bld: 119 mg/dL — ABNORMAL HIGH (ref 65–99)
Potassium: 4.2 mmol/L (ref 3.5–5.1)
SODIUM: 140 mmol/L (ref 135–145)

## 2017-03-20 LAB — URINALYSIS, ROUTINE W REFLEX MICROSCOPIC
Bilirubin Urine: NEGATIVE
GLUCOSE, UA: NEGATIVE mg/dL
Hgb urine dipstick: NEGATIVE
Ketones, ur: NEGATIVE mg/dL
LEUKOCYTES UA: NEGATIVE
Nitrite: NEGATIVE
PH: 5 (ref 5.0–8.0)
Protein, ur: NEGATIVE mg/dL
SPECIFIC GRAVITY, URINE: 1.024 (ref 1.005–1.030)

## 2017-03-20 LAB — TROPONIN I

## 2017-03-20 LAB — CBC WITH DIFFERENTIAL/PLATELET
Basophils Absolute: 0 10*3/uL (ref 0.0–0.1)
Basophils Relative: 0 %
EOS ABS: 0.3 10*3/uL (ref 0.0–0.7)
EOS PCT: 4 %
HCT: 43.5 % (ref 39.0–52.0)
Hemoglobin: 15 g/dL (ref 13.0–17.0)
LYMPHS ABS: 2.4 10*3/uL (ref 0.7–4.0)
Lymphocytes Relative: 33 %
MCH: 31.5 pg (ref 26.0–34.0)
MCHC: 34.5 g/dL (ref 30.0–36.0)
MCV: 91.4 fL (ref 78.0–100.0)
MONOS PCT: 5 %
Monocytes Absolute: 0.4 10*3/uL (ref 0.1–1.0)
Neutro Abs: 4.2 10*3/uL (ref 1.7–7.7)
Neutrophils Relative %: 58 %
PLATELETS: 183 10*3/uL (ref 150–400)
RBC: 4.76 MIL/uL (ref 4.22–5.81)
RDW: 13.8 % (ref 11.5–15.5)
WBC: 7.3 10*3/uL (ref 4.0–10.5)

## 2017-03-20 LAB — CBG MONITORING, ED: Glucose-Capillary: 113 mg/dL — ABNORMAL HIGH (ref 65–99)

## 2017-03-20 LAB — CK: Total CK: 71 U/L (ref 49–397)

## 2017-03-20 MED ORDER — LIDOCAINE HCL (PF) 2 % IJ SOLN: 10.0000 mL | Freq: Once | INTRAMUSCULAR | Status: DC

## 2017-03-20 MED ORDER — FENTANYL CITRATE (PF) 100 MCG/2ML IJ SOLN
25.0000 ug | Freq: Once | INTRAMUSCULAR | Status: AC
  Administered 2017-03-21: 25 ug via INTRAVENOUS
  Filled 2017-03-20: qty 2

## 2017-03-20 MED ORDER — LIDOCAINE HCL (PF) 1 % IJ SOLN
INTRAMUSCULAR | Status: AC
  Administered 2017-03-20: 22:00:00
  Filled 2017-03-20: qty 10

## 2017-03-20 MED ORDER — LORAZEPAM 2 MG/ML IJ SOLN
0.5000 mg | Freq: Once | INTRAMUSCULAR | Status: AC
Start: 2017-03-20 — End: 2017-03-20
  Administered 2017-03-20: 0.5 mg via INTRAVENOUS
  Filled 2017-03-20: qty 1

## 2017-03-20 NOTE — ED Provider Notes (Signed)
Scalp Laceration Repair.  Patient is a 76 year old male who fell and hit his head against a metal pole earlier today. The patient is on anticoagulation medication.  I discussed the procedure repairing the laceration with the patient in terms which he understands. He gives permission to proceed with the laceration closure.  Patient identified by arm band.  The area was cleansed with safe-cleanse. It was then painted with Betadine. The laceration was infiltrated with 1% plain lidocaine, 8 mL.  The laceration was cleansed with saline and safe-cleanse. Clots were removed. No debris appreciated. The patient has bleeding deep within the laceration. Using sterile technique, 4 interrupted sutures of 4-0 chromic gut were used to slow down the bleeding. The wound was then repaired with 17 staples. There remained an area of significant oozing. This was improved with 2 interrupted sutures of 4-0 nylon on the skin. The wound measures a total of 8 cm.  Bleeding has significantly improved. There is good wound edge anastomosis appreciated.  The patient is allergic to tetanus.  Patient tolerated the procedure without problem.   Lily Kocher, PA-C 03/20/17 [place], [place], DO 03/23/17 1342

## 2017-03-20 NOTE — H&P (Signed)
History and Physical    Brent Wilkins TIR:[id number] DOB: [birth year] DOA: 03/20/2017  PCP: Redmond School, MD  Patient coming from: Home.    Chief Complaint:  Syncope.   HPI: Brent Wilkins is an 76 y.o. male with hx of PAF, on Eliquis as he was having rash with Xarelto, recurrent syncope and positive tilt table test previously, hx of HTN, presented to the ER with syncope. He said his prior syncope has had prodrome, but this last one, there was no warning, and he was standing up eating a hotdog.  He denied having CP or SOB.  He hit his had, had a laceration, and was sutured.  Head CT was negative for ICH.  He has allergy to tetanus shot, so he was not given one.  In the ER, he has normal BP, Normal HR, and negative troponins.  Review of his chart showed that he sees Dr Harrell Lark and Dr Harrington Challenger, and had holter monitor last year, which showed min HR 42, max 106 with average of 67.  There was 2.6 second pauses during early monring hours, and symptoms correlated with afib normal rate.  Interestingly, he never had tachyarrythmia with his afib.  His CHADS was a 3.  He was Tx with Propanalol and Zoloft for his syncope.  Prior, he was on Norvasc and both medications have been reduced.  Hospitalist was asked to admit him for syncope.  He never had any clinical evidence making these events connected to seizure activities.   ED Course:  See above.  Rewiew of Systems:  Constitutional: Negative for malaise, fever and chills. No significant weight loss or weight gain Eyes: Negative for eye pain, redness and discharge, diplopia, visual changes, or flashes of light. ENMT: Negative for ear pain, hoarseness, nasal congestion, sinus pressure and sore throat. No headaches; tinnitus, drooling, or problem swallowing. Cardiovascular: Negative for chest pain, palpitations, diaphoresis, dyspnea and peripheral edema. ; No orthopnea, PND Respiratory: Negative for cough, hemoptysis, wheezing and stridor. No pleuritic  chestpain. Gastrointestinal: Negative for diarrhea, constipation,  melena, blood in stool, hematemesis, jaundice and rectal bleeding.    Genitourinary: Negative for frequency, dysuria, incontinence,flank pain and hematuria; Musculoskeletal: Negative for back pain and neck pain. Negative for swelling and trauma.;  Skin: . Negative for pruritus, rash, abrasions, bruising and skin lesion.; ulcerations Neuro: Negative for headache, lightheadedness and neck stiffness. Negative for weakness, altered level of consciousness , altered mental status, extremity weakness, burning feet, involuntary movement, seizure and syncope.  Psych: negative for anxiety, depression, insomnia, tearfulness, panic attacks, hallucinations, paranoia, suicidal or homicidal ideation    Past Medical History:  Diagnosis Date  . Arthritis   . Dysphagia   . Frequent urination at night   . GERD (gastroesophageal reflux disease)   . History of kidney stones   . HTN (hypertension)   . Hypothyroidism   . 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 Neuropathy    Bilateral ankles  . Prostate cancer [facility]) 2010   Dr. Rosana Hoes  . Seasonal allergies   . Syncope  dx. 8 yrs ago     Past Surgical History:  Procedure Laterality Date  . APPENDECTOMY    . CATARACT EXTRACTION W/PHACO Left 03/27/2015   Procedure: CATARACT EXTRACTION PHACO AND INTRAOCULAR LENS PLACEMENT LEFT EYE CDE=11.73;  Surgeon: Tonny Branch, MD;  Location: [facility];  Service: Ophthalmology;  Laterality: Left;  . CATARACT EXTRACTION W/PHACO Right 03/30/2015   Procedure: CATARACT EXTRACTION PHACO AND INTRAOCULAR LENS PLACEMENT RIGHT EYE CDE=7.35;  Surgeon: Tonny Branch, MD;  Location: [facility];  Service: Ophthalmology;  Laterality: Right;  . CHOLECYSTECTOMY    . COLONOSCOPY  07/23/10   Dr. Vivi Ferns rectum, scattered pancolonic diverticula  . COLONOSCOPY  08/10/1999   internal hemorrhoids,inflammatory polyp  . ESOPHAGOGASTRODUODENOSCOPY  04/12/08   Prominent Schatzki's ring,  erosive reflux esophagitis, multiple antral erosions, small hiatal hernia, reactive gastropathy, status post dilation with 52F  . ESOPHAGOGASTRODUODENOSCOPY  10/28/2011   Procedure: ESOPHAGOGASTRODUODENOSCOPY (EGD);  Surgeon: Daneil Dolin, MD;  Location: [facility];  Service: Endoscopy;  Laterality: N/A;  1:30  . MALONEY DILATION  10/28/2011   Procedure: Venia Minks DILATION;  Surgeon: Daneil Dolin, MD;  Location: [facility];  Service: Endoscopy;  Laterality: N/A;  . SAVORY DILATION  10/28/2011   Procedure: SAVORY DILATION;  Surgeon: Daneil Dolin, MD;  Location: [facility];  Service: Endoscopy;  Laterality: N/A;  . TONSILLECTOMY    . TOTAL ANKLE ARTHROPLASTY Left 11/25/2013   Procedure: TOTAL ANKLE ARTHOPLASTY;  Surgeon: Wylene Simmer, MD;  Location: [place];  Service: Orthopedics;  Laterality: Left;  . TOTAL ANKLE ARTHROPLASTY Right 07/14/2014   dr hewitt  . TOTAL ANKLE ARTHROPLASTY Right 07/14/2014   Procedure: RIGHT TOTAL ANKLE ARTHOPLASTY;  Surgeon: Wylene Simmer, MD;  Location: [place];  Service: Orthopedics;  Laterality: Right;     reports that he has never smoked. He has never used smokeless tobacco. He reports that he drinks about 0.6 - 1.2 oz of alcohol per week . He reports that he does not use drugs.  Allergies  Allergen Reactions  . Oxycodone     Severe constipation  . Statins     Hard to walk, severe leg cramps  . Tetanus Toxoids     Swells up  . Doxycycline Rash  . Penicillins Swelling and Rash    .Has patient had a PCN reaction causing immediate rash, facial/tongue/throat swelling, SOB or lightheadedness with hypotension: Yes Has patient had a PCN reaction causing severe rash involving mucus membranes or skin necrosis: No Has patient had a PCN reaction that required hospitalization: Yes Has patient had a PCN reaction occurring within the last 10 years: No If all of the above answers are "NO", then may proceed with Cephalosporin use.   . Sulfa Antibiotics Swelling and  Rash    Family History  Problem Relation Age of Onset  . Prostate cancer Father   . Prostate cancer Son 42  . Prostate cancer Brother   . Colon cancer Neg Hx      Prior to Admission medications   Medication Sig Start Date End Date Taking? Authorizing Provider  amLODipine (NORVASC) 5 MG tablet Take 1 tablet (5 mg total) by mouth daily. 06/05/15  Yes Lendon Colonel, NP  celecoxib (CELEBREX) 100 MG capsule Take 100 mg by mouth as needed.    Yes [provider]  ELIQUIS 5 MG TABS tablet TAKE 1 TABLET BY MOUTH TWO  TIMES DAILY 05/09/16  Yes Fay Records, MD  finasteride (PROSCAR) 5 MG tablet Take 5 mg by mouth daily.   Yes [provider]  fluticasone (FLONASE) 50 MCG/ACT nasal spray Place 1 spray into both nostrils daily as needed for allergies.  02/15/14  Yes [provider]  gabapentin (NEURONTIN) 300 MG capsule Take 300 mg by mouth 2 (two) times daily.  04/19/15  Yes [provider]  levothyroxine (SYNTHROID, LEVOTHROID) 75 MCG tablet Take 75 mcg by mouth daily. 02/13/15  Yes [provider]  Omega-3  Fatty Acids (RA FISH OIL) 1400 MG CPDR Take 1,400 mg by mouth daily.   Yes [provider]  omeprazole (PRILOSEC) 20 MG capsule TAKE 1 CAPSULE BY MOUTH 2  TIMES DAILY BEFORE A MEAL 09/19/16  Yes Carlis Stable, NP  Polyethyl Glycol-Propyl Glycol (SYSTANE) 0.4-0.3 % SOLN Place 1-2 drops into both eyes 2 (two) times daily as needed.   Yes [provider]  Potassium 99 MG TABS Take 1 tablet by mouth daily. Takes only during the summer   Yes [provider]  propranolol (INDERAL) 10 MG tablet Take 1 tablet (10 mg total) by mouth daily. 02/27/17  Yes Fay Records, MD  Pseudoephedrine-Acetaminophen (SINUS HEADACHE/NON-DROWSY PO) Take 2 tablets by mouth daily as needed.   Yes [provider]  Tamsulosin HCl (FLOMAX) 0.4 MG CAPS Take 0.4 mg by mouth daily after breakfast.    Yes [provider]  vitamin B-12  (CYANOCOBALAMIN) 500 MCG tablet Take 1,000 mcg by mouth daily.    Yes [provider]    Physical Exam: Vitals:   03/20/17 2145 03/20/17 2200 03/20/17 2215 03/20/17 2230  BP:  (!) 187/98  (!) 159/101  Pulse: 77 77 71 74  Resp: 16 16 12 12   Temp:      TempSrc:      SpO2: 96% 94% 99% 96%  Weight:      Height:          Constitutional: NAD, calm, comfortable Vitals:   03/20/17 2145 03/20/17 2200 03/20/17 2215 03/20/17 2230  BP:  (!) 187/98  (!) 159/101  Pulse: 77 77 71 74  Resp: 16 16 12 12   Temp:      TempSrc:      SpO2: 96% 94% 99% 96%  Weight:      Height:       Eyes: PERRL, lids and conjunctivae normal ENMT: Mucous membranes are moist. Posterior pharynx clear of any exudate or lesions.Normal dentition.  Neck: normal, supple, no masses, no thyromegaly Respiratory: clear to auscultation bilaterally, no wheezing, no crackles. Normal respiratory effort. No accessory muscle use.  Cardiovascular: Regular rate and rhythm, no murmurs / rubs / gallops. No extremity edema. 2+ pedal pulses. No carotid bruits.  Abdomen: no tenderness, no masses palpated. No hepatosplenomegaly. Bowel sounds positive.  Musculoskeletal: no clubbing / cyanosis. No joint deformity upper and lower extremities. Good ROM, no contractures. Normal muscle tone.  Skin: no rashes, lesions, ulcers. No induration Neurologic: CN 2-12 grossly intact. Sensation intact, DTR normal. Strength 5/5 in all 4.  Psychiatric: Normal judgment and insight. Alert and oriented x 3. Normal mood.    Labs on Admission: I have personally reviewed following labs and imaging studies CBC:  Recent Labs Lab 03/20/17 1834  WBC 7.3  NEUTROABS 4.2  HGB 15.0  HCT 43.5  MCV 91.4  PLT 161   Basic Metabolic Panel:  Recent Labs Lab 03/20/17 1834  NA 140  K 4.2  CL 107  CO2 24  GLUCOSE 119*  BUN 20  CREATININE 1.22  CALCIUM 9.2   Cardiac Enzymes:  Recent Labs Lab 03/20/17 1834 03/20/17 1840  CKTOTAL  --  71    TROPONINI <0.03  --     Recent Labs Lab 03/20/17 1840  GLUCAP 113*   Radiological Exams on Admission: Dg Chest 2 View  Result Date: 03/20/2017 CLINICAL DATA:  Syncopal episode, striking head today. EXAM: CHEST  2 VIEW COMPARISON:  11/15/2013 FINDINGS: Heart size at the upper limits of normal. Aortic atherosclerosis.  The pulmonary vascularity is normal. The lungs are clear. No effusions. No acute bone finding. IMPRESSION: No active disease.  Aortic atherosclerosis. Electronically Signed   By: [name] Chimes M.D.   On: 03/20/2017 19:16   Ct Head Wo Contrast  Result Date: 03/20/2017 CLINICAL DATA:  76 year old male with history of syncopal event injuring head on metal pole. Laceration to the back of the head. EXAM: CT HEAD WITHOUT CONTRAST CT CERVICAL SPINE WITHOUT CONTRAST TECHNIQUE: Multidetector CT imaging of the head and cervical spine was performed following the standard protocol without intravenous contrast. Multiplanar CT image reconstructions of the cervical spine were also generated. COMPARISON:  Head CT 04/29/2011. FINDINGS: CT HEAD FINDINGS Brain: Patchy and confluent areas of decreased attenuation are noted throughout the deep and periventricular white matter of the cerebral hemispheres bilaterally, compatible with chronic microvascular ischemic disease. Physiologic calcifications in the left basal ganglia. No evidence of acute infarction, hemorrhage, hydrocephalus, extra-axial collection or mass lesion/mass effect. Vascular: No hyperdense vessel or unexpected calcification. Skull: Normal. Negative for fracture or focal lesion. Sinuses/Orbits: No acute finding. Other: Small amount of soft tissue swelling in the right frontal scalp, presumably a hematoma. Bandage overlying the low left parietal scalp where there is also some mild soft tissue swelling. CT CERVICAL SPINE FINDINGS Alignment: Normal. Skull base and vertebrae: No acute fracture. No primary bone lesion or focal pathologic process.  Soft tissues and spinal canal: No prevertebral fluid or swelling. No visible canal hematoma. Disc levels: Mild multilevel degenerative disc disease, most apparent at C6-C7. Mild multilevel facet arthropathy. Upper chest: Unremarkable. Other: None. IMPRESSION: 1. Small scalp contusion in the left parietal region and small right frontal scalp hematoma. No underlying displaced skull fracture, evidence of significant acute traumatic injury to the brain, or acute abnormality of the cervical spine. 2. Mild chronic microvascular ischemic changes in the cerebral white matter again noted. 3. Mild multilevel degenerative disc disease and cervical spondylosis. Electronically Signed   By: Vinnie Langton M.D.   On: 03/20/2017 19:26   Ct Cervical Spine Wo Contrast  Result Date: 03/20/2017 CLINICAL DATA:  76 year old male with history of syncopal event injuring head on metal pole. Laceration to the back of the head. EXAM: CT HEAD WITHOUT CONTRAST CT CERVICAL SPINE WITHOUT CONTRAST TECHNIQUE: Multidetector CT imaging of the head and cervical spine was performed following the standard protocol without intravenous contrast. Multiplanar CT image reconstructions of the cervical spine were also generated. COMPARISON:  Head CT 04/29/2011. FINDINGS: CT HEAD FINDINGS Brain: Patchy and confluent areas of decreased attenuation are noted throughout the deep and periventricular white matter of the cerebral hemispheres bilaterally, compatible with chronic microvascular ischemic disease. Physiologic calcifications in the left basal ganglia. No evidence of acute infarction, hemorrhage, hydrocephalus, extra-axial collection or mass lesion/mass effect. Vascular: No hyperdense vessel or unexpected calcification. Skull: Normal. Negative for fracture or focal lesion. Sinuses/Orbits: No acute finding. Other: Small amount of soft tissue swelling in the right frontal scalp, presumably a hematoma. Bandage overlying the low left parietal scalp where  there is also some mild soft tissue swelling. CT CERVICAL SPINE FINDINGS Alignment: Normal. Skull base and vertebrae: No acute fracture. No primary bone lesion or focal pathologic process. Soft tissues and spinal canal: No prevertebral fluid or swelling. No visible canal hematoma. Disc levels: Mild multilevel degenerative disc disease, most apparent at C6-C7. Mild multilevel facet arthropathy. Upper chest: Unremarkable. Other: None. IMPRESSION: 1. Small scalp contusion in the left parietal region and small right frontal scalp hematoma. No underlying displaced skull  fracture, evidence of significant acute traumatic injury to the brain, or acute abnormality of the cervical spine. 2. Mild chronic microvascular ischemic changes in the cerebral white matter again noted. 3. Mild multilevel degenerative disc disease and cervical spondylosis. Electronically Signed   By: Vinnie Langton M.D.   On: 03/20/2017 19:26    EKG: Independently reviewed.  Assessment/Plan Principal Problem:   Syncope, cardiogenic Active Problems:   Atrial fibrillation (HCC)   Essential hypertension   PLAN:   Syncope:  I worry that this may be cardiogenic syncope.  If he has had tachyarrythmia with his Afib, he may need a pacemaker.  His history of afib is mostly bradycardia with occasional pauses up to 2.6 seconds, early in the am.  I don't think BB or norvasc is ideal for him.  BB has caused lightheadedness in the past, bradycardia and pauses.  Dr Harrington Challenger had reduced his dose previously, and he is on one per day now.  With orthostatic, he may not be able to tolerate norvasc.  ACE I or ARB may be better anti HTN meds for him.  Will admit, place on telemetry.  Hold Norvasc and Propanolol.  Monitor BP.  Will obtain ECHO (last one 2016), and will consult cardiology for further recommendation.   Obtain am cortisol level.  Obtain TSH.   HTN: Since he has orthostasis, may compromise with a little higher systolic BP.  This is especially  important with recurrent syncope in patient on full anticoagulation.   If BP meds is required, ACE I or ARB would probably be best.   Afib:  CHADS 3, on Eliquis.  Will continue.  Though if his recurrent syncope cannot be eliminated,  Then a Watchman's Device will need to be considered.    DVT prophylaxis:  ELiquis.  Code Status:  FULL CODE.  Family Communication: wife at bedside.  Disposition Plan: Home.  Consults called: None. Admission status: OBS>   Shadman Tozzi MD FACP. [facility]  If 7PM-7AM, please contact night-coverage [email or web address] Password [facility]  03/20/2017, 11:31 PM

## 2017-03-20 NOTE — ED Notes (Signed)
Patient transported to X-ray 

## 2017-03-20 NOTE — ED Provider Notes (Signed)
[facility] DEPT Provider Note   CSN: [id number] Arrival date & time: 03/20/17  1824     History   Chief Complaint Chief Complaint  Patient presents with  . Loss of Consciousness    HPI Brent Wilkins is a 76 y.o. male.  HPI  Pt was seen at [place]. Per pt, c/o sudden onset and resolution of one episode of syncope that occurred PTA. 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 Pt states he "just passed out" without any prodromal symptoms. Pt states he believes he hit his head on a metal pole when he fell. Denies CP/palpitations, no SOB/cough, no abd pain, no N/V/D, no focal motor weakness, no tingling/numbness in extremities, no lightheadedness, no facial droop, no slurred speech.   States "allergic" to Td Past Medical History:  Diagnosis Date  . Arthritis   . Dysphagia   . Frequent urination at night   . GERD (gastroesophageal reflux disease)   . History of kidney stones   . HTN (hypertension)   . Hypothyroidism   . 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 Neuropathy    Bilateral ankles  . Prostate cancer [facility]) 2010   Dr. Rosana Hoes  . Seasonal allergies   . Syncope  dx. 8 yrs ago    Patient Active Problem List   Diagnosis Date Noted  . Dizziness 12/13/2015  . Atrial fibrillation ([place]) 06/05/2015  . Essential hypertension 06/05/2015  . 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 Arthritis of left ankle 11/25/2013  . Dysphagia 10/22/2011  . GERD (gastroesophageal reflux disease) 10/22/2011  . Schatzki's ring 10/22/2011    Past Surgical History:  Procedure Laterality Date  . APPENDECTOMY    . CATARACT EXTRACTION W/PHACO Left 03/27/2015   Procedure: CATARACT EXTRACTION PHACO AND INTRAOCULAR LENS PLACEMENT LEFT EYE CDE=11.73;  Surgeon: Tonny Branch, MD;  Location: [facility];  Service: Ophthalmology;  Laterality: Left;  . CATARACT EXTRACTION W/PHACO Right 03/30/2015   Procedure: CATARACT EXTRACTION PHACO AND INTRAOCULAR LENS PLACEMENT RIGHT EYE CDE=7.35;   Surgeon: Tonny Branch, MD;  Location: [facility];  Service: Ophthalmology;  Laterality: Right;  . CHOLECYSTECTOMY    . COLONOSCOPY  07/23/10   Dr. Vivi Ferns rectum, scattered pancolonic diverticula  . COLONOSCOPY  08/10/1999   internal hemorrhoids,inflammatory polyp  . ESOPHAGOGASTRODUODENOSCOPY  04/12/08   Prominent Schatzki's ring, erosive reflux esophagitis, multiple antral erosions, small hiatal hernia, reactive gastropathy, status post dilation with 71F  . ESOPHAGOGASTRODUODENOSCOPY  10/28/2011   Procedure: ESOPHAGOGASTRODUODENOSCOPY (EGD);  Surgeon: Daneil Dolin, MD;  Location: [facility];  Service: Endoscopy;  Laterality: N/A;  1:30  . MALONEY DILATION  10/28/2011   Procedure: Venia Minks DILATION;  Surgeon: Daneil Dolin, MD;  Location: [facility];  Service: Endoscopy;  Laterality: N/A;  . SAVORY DILATION  10/28/2011   Procedure: SAVORY DILATION;  Surgeon: Daneil Dolin, MD;  Location: [facility];  Service: Endoscopy;  Laterality: N/A;  . TONSILLECTOMY    . TOTAL ANKLE ARTHROPLASTY Left 11/25/2013   Procedure: TOTAL ANKLE ARTHOPLASTY;  Surgeon: Wylene Simmer, MD;  Location: [facility];  Service: Orthopedics;  Laterality: Left;  . TOTAL ANKLE ARTHROPLASTY Right 07/14/2014   dr hewitt  . TOTAL ANKLE ARTHROPLASTY Right 07/14/2014   Procedure: RIGHT TOTAL ANKLE ARTHOPLASTY;  Surgeon: Wylene Simmer, MD;  Location: [facility];  Service: Orthopedics;  Laterality: Right;       Home Medications    Prior to Admission medications   Medication Sig Start Date End Date  Taking? Authorizing Provider  amLODipine (NORVASC) 5 MG tablet Take 1 tablet (5 mg total) by mouth daily. 06/05/15   Lendon Colonel, NP  celecoxib (CELEBREX) 100 MG capsule Take 100 mg by mouth as needed.     [provider]  ELIQUIS 5 MG TABS tablet TAKE 1 TABLET BY MOUTH TWO  TIMES DAILY 05/09/16   Fay Records, MD  finasteride (PROSCAR) 5 MG tablet Take 5 mg by mouth daily.    [provider]  fluticasone  (FLONASE) 50 MCG/ACT nasal spray Place 1 spray into both nostrils daily as needed for allergies.  02/15/14   [provider]  gabapentin (NEURONTIN) 300 MG capsule Take 300 mg by mouth 2 (two) times daily.  04/19/15   [provider]  levothyroxine (SYNTHROID, LEVOTHROID) 75 MCG tablet Take 75 mcg by mouth daily. 02/13/15   [provider]  Omega-3 Fatty Acids (RA FISH OIL) 1400 MG CPDR Take 1,400 mg by mouth daily.    [provider]  omeprazole (PRILOSEC) 20 MG capsule TAKE 1 CAPSULE BY MOUTH 2  TIMES DAILY BEFORE A MEAL 09/19/16   Carlis Stable, NP  Potassium 99 MG TABS Take 1 tablet by mouth daily. Takes only during the summer    [provider]  propranolol (INDERAL) 10 MG tablet Take 1 tablet (10 mg total) by mouth daily. 02/27/17   Fay Records, MD  Tamsulosin HCl (FLOMAX) 0.4 MG CAPS Take 0.4 mg by mouth daily after breakfast.     [provider]  vitamin B-12 (CYANOCOBALAMIN) 500 MCG tablet Take 500 mcg by mouth daily.    [provider]    Family History Family History  Problem Relation Age of Onset  . Prostate cancer Father   . Prostate cancer Son 77  . Prostate cancer Brother   . Colon cancer Neg Hx     Social History Social History  Substance Use Topics  . Smoking status: Never Smoker  . Smokeless tobacco: Never Used  . Alcohol use 0.6 - 1.2 oz/week    1 - 2 Cans of beer per week     Comment: 2 beers a week     Allergies   Oxycodone; Statins; Tetanus toxoids; Penicillins; and Sulfa antibiotics   Review of Systems Review of Systems ROS: Statement: All systems negative except as marked or noted in the HPI; Constitutional: Negative for fever and chills. ; ; Eyes: Negative for eye pain, redness and discharge. ; ; ENMT: Negative for ear pain, hoarseness, nasal congestion, sinus pressure and sore throat. ; ; Cardiovascular: Negative for chest pain, palpitations, diaphoresis, dyspnea and peripheral edema. ; ;  Respiratory: Negative for cough, wheezing and stridor. ; ; Gastrointestinal: Negative for nausea, vomiting, diarrhea, abdominal pain, blood in stool, hematemesis, jaundice and rectal bleeding. . ; ; Genitourinary: Negative for dysuria, flank pain and hematuria. ; ; Musculoskeletal: Negative for back pain and neck pain. Negative for swelling and deformity.; ; Skin: +scalp lac. Negative for pruritus, rash, abrasions, blisters, bruising and skin lesion.; ; Neuro: Negative for headache, lightheadedness and neck stiffness. Negative for weakness, extremity weakness, paresthesias, involuntary movement, seizure and +syncope.      Physical Exam Updated Vital Signs BP (!) 176/81   Pulse 72   Temp 98 F (36.7 C) (Oral)   Resp 13   Ht 5\' 10"  (1.778 m)   Wt 87.1 kg (192 lb)   SpO2 96%   BMI 27.55 kg/m    21:16:21 Orthostatic Vital Signs  BD  Orthostatic Lying   BP- Lying:  175/109  Pulse- Lying: 74      Orthostatic Sitting  BP- Sitting:  186/97  Pulse- Sitting: 79      Orthostatic Standing at 0 minutes  BP- Standing at 0 minutes:  167/107  Pulse- Standing at 0 minutes: 80      Physical Exam 1845: Physical examination:  Nursing notes reviewed; Vital signs and O2 SAT reviewed;  Constitutional: Well developed, Well nourished, Well hydrated, In no acute distress; Head:  Normocephalic, +approximately 5cm "V-shaped" lac to left posterior parietal scalp with localized contusion. +contusion to right lower forehead, no open wound.;;; Eyes: EOMI, PERRL, No scleral icterus; ENMT: Mouth and pharynx normal, Mucous membranes moist; Neck: Supple, Full range of motion, No lymphadenopathy; Cardiovascular: Regular rate and rhythm, No gallop; Respiratory: Breath sounds clear & equal bilaterally, No wheezes.  Speaking full sentences with ease, Normal respiratory effort/excursion; Chest: Nontender, Movement normal; Abdomen: Soft, Nontender, Nondistended, Normal bowel sounds; Genitourinary: No CVA tenderness;  Spine:  No midline CS, TS, LS tenderness.;; Extremities: Pulses normal, No tenderness, No edema, No calf edema or asymmetry.; Neuro: AA&Ox3, Major CN grossly intact.  Speech clear. No gross focal motor or sensory deficits in extremities.; Skin: Color normal, Warm, Dry.    ED Treatments / Results  Labs (all labs ordered are listed, but only abnormal results are displayed)   EKG  EKG Interpretation  Date/Time:  Thursday March 20 2017 18:35:42 EDT Ventricular Rate:  73 PR Interval:    QRS Duration: 141 QT Interval:  421 QTC Calculation: 464 R Axis:   74 Text Interpretation:  Sinus rhythm Right bundle branch block When compared with ECG of 03/22/2015 Normal sinus rhythm has replaced Atrial fibrillation Confirmed by Francine Graven [phone]) on 03/20/2017 7:31:56 PM       Radiology   Procedures Procedures (including critical care time)  Medications Ordered in ED Medications - No data to display   Initial Impression / Assessment and Plan / ED Course  I have reviewed the triage vital signs and the nursing notes.  Pertinent labs & imaging results that were available during my care of the patient were reviewed by me and considered in my medical decision making (see chart for details).  MDM Reviewed: previous chart, nursing note and vitals Reviewed previous: labs and ECG Interpretation: labs, ECG, x-ray and CT scan    Results for orders placed or performed during the hospital encounter of 03/20/17  CBC with Differential  Result Value Ref Range   WBC 7.3 4.0 - 10.5 K/uL   RBC 4.76 4.22 - 5.81 MIL/uL   Hemoglobin 15.0 13.0 - 17.0 g/dL   HCT 43.5 39.0 - 52.0 %   MCV 91.4 78.0 - 100.0 fL   MCH 31.5 26.0 - 34.0 pg   MCHC 34.5 30.0 - 36.0 g/dL   RDW 13.8 11.5 - 15.5 %   Platelets 183 150 - 400 K/uL   Neutrophils Relative % 58 %   Neutro Abs 4.2 1.7 - 7.7 K/uL   Lymphocytes Relative 33 %   Lymphs Abs 2.4 0.7 - 4.0 K/uL   Monocytes Relative 5 %   Monocytes Absolute 0.4 0.1  - 1.0 K/uL   Eosinophils Relative 4 %   Eosinophils Absolute 0.3 0.0 - 0.7 K/uL   Basophils Relative 0 %   Basophils Absolute 0.0 0.0 - 0.1 K/uL  Basic metabolic panel  Result Value Ref Range   Sodium 140 135 - 145 mmol/L   Potassium 4.2  3.5 - 5.1 mmol/L   Chloride 107 101 - 111 mmol/L   CO2 24 22 - 32 mmol/L   Glucose, Bld 119 (H) 65 - 99 mg/dL   BUN 20 6 - 20 mg/dL   Creatinine, Ser 1.22 0.61 - 1.24 mg/dL   Calcium 9.2 8.9 - 10.3 mg/dL   GFR calc non Af Amer 55 (L) >60 mL/min   GFR calc Af Amer >60 >60 mL/min   Anion gap 9 5 - 15  Troponin I  Result Value Ref Range   Troponin I <0.03 <0.03 ng/mL  CK  Result Value Ref Range   Total CK 71 49 - 397 U/L  POC CBG, ED  Result Value Ref Range   Glucose-Capillary 113 (H) 65 - 99 mg/dL   Dg Chest 2 View Result Date: 03/20/2017 CLINICAL DATA:  Syncopal episode, striking head today. EXAM: CHEST  2 VIEW COMPARISON:  11/15/2013 FINDINGS: Heart size at the upper limits of normal. Aortic atherosclerosis. The pulmonary vascularity is normal. The lungs are clear. No effusions. No acute bone finding. IMPRESSION: No active disease.  Aortic atherosclerosis. Electronically Signed   By: [name] Chimes M.D.   On: 03/20/2017 19:16   Ct Head Wo Contrast Result Date: 03/20/2017 CLINICAL DATA:  76 year old male with history of syncopal event injuring head on metal pole. Laceration to the back of the head. EXAM: CT HEAD WITHOUT CONTRAST CT CERVICAL SPINE WITHOUT CONTRAST TECHNIQUE: Multidetector CT imaging of the head and cervical spine was performed following the standard protocol without intravenous contrast. Multiplanar CT image reconstructions of the cervical spine were also generated. COMPARISON:  Head CT 04/29/2011. FINDINGS: CT HEAD FINDINGS Brain: Patchy and confluent areas of decreased attenuation are noted throughout the deep and periventricular white matter of the cerebral hemispheres bilaterally, compatible with chronic microvascular ischemic  disease. Physiologic calcifications in the left basal ganglia. No evidence of acute infarction, hemorrhage, hydrocephalus, extra-axial collection or mass lesion/mass effect. Vascular: No hyperdense vessel or unexpected calcification. Skull: Normal. Negative for fracture or focal lesion. Sinuses/Orbits: No acute finding. Other: Small amount of soft tissue swelling in the right frontal scalp, presumably a hematoma. Bandage overlying the low left parietal scalp where there is also some mild soft tissue swelling. CT CERVICAL SPINE FINDINGS Alignment: Normal. Skull base and vertebrae: No acute fracture. No primary bone lesion or focal pathologic process. Soft tissues and spinal canal: No prevertebral fluid or swelling. No visible canal hematoma. Disc levels: Mild multilevel degenerative disc disease, most apparent at C6-C7. Mild multilevel facet arthropathy. Upper chest: Unremarkable. Other: None. IMPRESSION: 1. Small scalp contusion in the left parietal region and small right frontal scalp hematoma. No underlying displaced skull fracture, evidence of significant acute traumatic injury to the brain, or acute abnormality of the cervical spine. 2. Mild chronic microvascular ischemic changes in the cerebral white matter again noted. 3. Mild multilevel degenerative disc disease and cervical spondylosis. Electronically Signed   By: Vinnie Langton M.D.   On: 03/20/2017 19:26   Ct Cervical Spine Wo Contrast Result Date: 03/20/2017 CLINICAL DATA:  76 year old male with history of syncopal event injuring head on metal pole. Laceration to the back of the head. EXAM: CT HEAD WITHOUT CONTRAST CT CERVICAL SPINE WITHOUT CONTRAST TECHNIQUE: Multidetector CT imaging of the head and cervical spine was performed following the standard protocol without intravenous contrast. Multiplanar CT image reconstructions of the cervical spine were also generated. COMPARISON:  Head CT 04/29/2011. FINDINGS: CT HEAD FINDINGS Brain: Patchy and  confluent areas of decreased  attenuation are noted throughout the deep and periventricular white matter of the cerebral hemispheres bilaterally, compatible with chronic microvascular ischemic disease. Physiologic calcifications in the left basal ganglia. No evidence of acute infarction, hemorrhage, hydrocephalus, extra-axial collection or mass lesion/mass effect. Vascular: No hyperdense vessel or unexpected calcification. Skull: Normal. Negative for fracture or focal lesion. Sinuses/Orbits: No acute finding. Other: Small amount of soft tissue swelling in the right frontal scalp, presumably a hematoma. Bandage overlying the low left parietal scalp where there is also some mild soft tissue swelling. CT CERVICAL SPINE FINDINGS Alignment: Normal. Skull base and vertebrae: No acute fracture. No primary bone lesion or focal pathologic process. Soft tissues and spinal canal: No prevertebral fluid or swelling. No visible canal hematoma. Disc levels: Mild multilevel degenerative disc disease, most apparent at C6-C7. Mild multilevel facet arthropathy. Upper chest: Unremarkable. Other: None. IMPRESSION: 1. Small scalp contusion in the left parietal region and small right frontal scalp hematoma. No underlying displaced skull fracture, evidence of significant acute traumatic injury to the brain, or acute abnormality of the cervical spine. 2. Mild chronic microvascular ischemic changes in the cerebral white matter again noted. 3. Mild multilevel degenerative disc disease and cervical spondylosis. Electronically Signed   By: Vinnie Langton M.D.   On: 03/20/2017 19:26    2230:  Lac repaired by APP (see separate note). Pt not orthostatic on VS. Dx and testing d/w pt.  Questions answered.  Verb understanding, agreeable to admit. T/C to [facility]. Marin Comment, case discussed, including:  HPI, pertinent PM/SHx, VS/PE, dx testing, ED course and treatment:  Agreeable to admit.  Final Clinical Impressions(s) / ED Diagnoses   Final  diagnoses:  None    New Prescriptions New Prescriptions   No medications on file     Francine Graven, DO 03/23/17 1341

## 2017-03-20 NOTE — ED Triage Notes (Signed)
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  Reported pt has been there since 1000 this morning.  Pt is on Eliquis and with lac to back of head.

## 2017-03-20 NOTE — ED Notes (Signed)
ED Provider at bedside.  Sima Matas, PA in room to suture

## 2017-03-20 NOTE — ED Notes (Signed)
Per Dr Marin Comment he is not going to address pt's hypertension at this time.

## 2017-03-21 ENCOUNTER — Observation Stay (HOSPITAL_BASED_OUTPATIENT_CLINIC_OR_DEPARTMENT_OTHER): Payer: Medicare Other

## 2017-03-21 ENCOUNTER — Other Ambulatory Visit: Payer: Self-pay | Admitting: *Deleted

## 2017-03-21 ENCOUNTER — Other Ambulatory Visit: Payer: Self-pay | Admitting: Adult Health

## 2017-03-21 DIAGNOSIS — I480 Paroxysmal atrial fibrillation: Secondary | ICD-10-CM

## 2017-03-21 DIAGNOSIS — R55 Syncope and collapse: Secondary | ICD-10-CM | POA: Diagnosis not present

## 2017-03-21 DIAGNOSIS — I951 Orthostatic hypotension: Secondary | ICD-10-CM

## 2017-03-21 DIAGNOSIS — I361 Nonrheumatic tricuspid (valve) insufficiency: Secondary | ICD-10-CM

## 2017-03-21 DIAGNOSIS — N182 Chronic kidney disease, stage 2 (mild): Secondary | ICD-10-CM | POA: Diagnosis not present

## 2017-03-21 DIAGNOSIS — I10 Essential (primary) hypertension: Secondary | ICD-10-CM

## 2017-03-21 DIAGNOSIS — I4891 Unspecified atrial fibrillation: Secondary | ICD-10-CM | POA: Diagnosis not present

## 2017-03-21 LAB — CORTISOL: CORTISOL PLASMA: 11.1 ug/dL

## 2017-03-21 LAB — TSH: TSH: 2.125 u[IU]/mL (ref 0.350–4.500)

## 2017-03-21 LAB — ECHOCARDIOGRAM COMPLETE
HEIGHTINCHES: 70 in
WEIGHTICAEL: 3088 [oz_av]

## 2017-03-21 MED ORDER — TAMSULOSIN HCL 0.4 MG PO CAPS
0.4000 mg | ORAL_CAPSULE | Freq: Every day | ORAL | Status: DC
  Administered 2017-03-21 – 2017-03-22 (×2): 0.4 mg via ORAL
  Filled 2017-03-21 (×2): qty 1

## 2017-03-21 MED ORDER — VITAMIN B-12 1000 MCG PO TABS
1000.0000 ug | ORAL_TABLET | Freq: Every day | ORAL | Status: DC
  Administered 2017-03-21 – 2017-03-22 (×2): 1000 ug via ORAL
  Filled 2017-03-21 (×2): qty 1

## 2017-03-21 MED ORDER — OMEGA-3-ACID ETHYL ESTERS 1 G PO CAPS
1000.0000 mg | ORAL_CAPSULE | Freq: Every day | ORAL | Status: DC
  Administered 2017-03-21 – 2017-03-22 (×2): 1000 mg via ORAL
  Filled 2017-03-21 (×2): qty 1

## 2017-03-21 MED ORDER — POLYVINYL ALCOHOL 1.4 % OP SOLN: 1.0000 [drp] | Freq: Two times a day (BID) | OPHTHALMIC | Status: DC | PRN

## 2017-03-21 MED ORDER — LEVOTHYROXINE SODIUM 75 MCG PO TABS
75.0000 ug | ORAL_TABLET | Freq: Every day | ORAL | Status: DC
  Administered 2017-03-21 – 2017-03-22 (×2): 75 ug via ORAL
  Filled 2017-03-21 (×2): qty 1

## 2017-03-21 MED ORDER — HYDRALAZINE HCL 20 MG/ML IJ SOLN: 5.0000 mg | Freq: Three times a day (TID) | INTRAMUSCULAR | Status: DC | PRN

## 2017-03-21 MED ORDER — SODIUM CHLORIDE 0.9% FLUSH
3.0000 mL | Freq: Two times a day (BID) | INTRAVENOUS | Status: DC
  Administered 2017-03-21 (×2): 3 mL via INTRAVENOUS

## 2017-03-21 MED ORDER — FLUTICASONE PROPIONATE 50 MCG/ACT NA SUSP: 1.0000 | Freq: Every day | NASAL | Status: DC | PRN

## 2017-03-21 MED ORDER — FINASTERIDE 5 MG PO TABS
5.0000 mg | ORAL_TABLET | Freq: Every day | ORAL | Status: DC
  Administered 2017-03-21 – 2017-03-22 (×2): 5 mg via ORAL
  Filled 2017-03-21 (×4): qty 1

## 2017-03-21 MED ORDER — APIXABAN 5 MG PO TABS
5.0000 mg | ORAL_TABLET | Freq: Two times a day (BID) | ORAL | Status: DC
  Administered 2017-03-21 – 2017-03-22 (×4): 5 mg via ORAL
  Filled 2017-03-21 (×4): qty 1

## 2017-03-21 MED ORDER — SODIUM CHLORIDE 0.9 % IV SOLN
INTRAVENOUS | Status: AC
  Administered 2017-03-21 (×2): via INTRAVENOUS

## 2017-03-21 MED ORDER — GABAPENTIN 300 MG PO CAPS
300.0000 mg | ORAL_CAPSULE | Freq: Two times a day (BID) | ORAL | Status: DC
  Administered 2017-03-21 – 2017-03-22 (×4): 300 mg via ORAL
  Filled 2017-03-21 (×4): qty 1

## 2017-03-21 MED ORDER — ACETAMINOPHEN 325 MG PO TABS
650.0000 mg | ORAL_TABLET | Freq: Four times a day (QID) | ORAL | Status: DC | PRN
  Administered 2017-03-21 – 2017-03-22 (×3): 650 mg via ORAL
  Filled 2017-03-21 (×3): qty 2

## 2017-03-21 MED ORDER — PANTOPRAZOLE SODIUM 40 MG PO TBEC
40.0000 mg | DELAYED_RELEASE_TABLET | Freq: Every day | ORAL | Status: DC
  Administered 2017-03-21 – 2017-03-22 (×2): 40 mg via ORAL
  Filled 2017-03-21 (×2): qty 1

## 2017-03-21 NOTE — Consult Note (Signed)
Cardiology Consultation:   Patient ID: Brent Wilkins; [id number]; [birth year]   Admit date: 03/20/2017 Date of Consult: 03/21/2017  Primary Care Provider: Redmond School, MD Primary Cardiologist: Harrington Challenger    Patient Profile:   Brent Wilkins is a 76 y.o. male with a hx of  Paroxysmal atrial fibrillation, on ELIQUIS, hyperlipidemia, history of syncope with positive tilt table, who is being seen today for the evaluation of syncope at the request of Dr. Carles Collet  History of Present Illness:   Brent Wilkins presented to the emergency room after experiencing a syncopal episode. He was working outside all day, and was setting up a stent to be ready for a football game last night at [employer]. The patient states he stayed hydrated all day. Just before opening the stand, the patient stated began drinking a Coke and eating a hotdog. He said he took 2 bites and that's a lasting he remembers. He awoke looking up into the face of one of the football coaches who incidentally is also EMS. He hit his head on a table on the way down apparently, and suffered the laceration..  As result of the syncopal episode the patient had a head injury with a laceration that required suturing. CT scan of the head was negative for intracranial bleeding.   His wife states that he has been feeling more tired over the last couple of weeks, and has had 2 days where he would come in from being outside stating how tired he was. He reports that he was not overexerting himself, "I'm tired and I haven't done anything". The patient also has had 2 episodes of dizzy spells but no syncope.  He denies chest pain, oral, dyspnea, diaphoresis, or palpitations prior to any of these events.  On arrival to the emergency room the patient's blood pressure was 178/92 heart rate 75 O2 sat 97% patient was afebrile. Pertinent labs, negative or UTI, he was not found to be anemic, creatinine 1.22. Potassium 4.2. Sodium 140. CT scan did reveal small  scalp contusion in the left parietal region and a small right frontal scalp hematoma. There was no fracture. Mild chronic microvascular ischemic changes were noted. Mild multilevel degenerative disc disease and cervical spondylosis was also noted. EKG revealed normal sinus rhythm, right bundle branch block , heart rate of 73 bpm. This has replaced atrial fibrillation which was seen on EKG in the past.  Past Medical History:  Diagnosis Date  . Arthritis   . Dysphagia   . Frequent urination at night   . GERD (gastroesophageal reflux disease)   . History of kidney stones   . HTN (hypertension)   . Hypothyroidism   . 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 Neuropathy    Bilateral ankles  . Prostate cancer [facility] 2010   Dr. Rosana Hoes  . Seasonal allergies   . Syncope  dx. 8 yrs ago    Past Surgical History:  Procedure Laterality Date  . APPENDECTOMY    . CATARACT EXTRACTION W/PHACO Left 03/27/2015   Procedure: CATARACT EXTRACTION PHACO AND INTRAOCULAR LENS PLACEMENT LEFT EYE CDE=11.73;  Surgeon: Tonny Branch, MD;  Location: [facility];  Service: Ophthalmology;  Laterality: Left;  . CATARACT EXTRACTION W/PHACO Right 03/30/2015   Procedure: CATARACT EXTRACTION PHACO AND INTRAOCULAR LENS PLACEMENT RIGHT EYE CDE=7.35;  Surgeon: Tonny Branch, MD;  Location: [facility];  Service: Ophthalmology;  Laterality: Right;  . CHOLECYSTECTOMY    . COLONOSCOPY  07/23/10   Dr. Vivi Ferns rectum, scattered  pancolonic diverticula  . COLONOSCOPY  08/10/1999   internal hemorrhoids,inflammatory polyp  . ESOPHAGOGASTRODUODENOSCOPY  04/12/08   Prominent Schatzki's ring, erosive reflux esophagitis, multiple antral erosions, small hiatal hernia, reactive gastropathy, status post dilation with 97F  . ESOPHAGOGASTRODUODENOSCOPY  10/28/2011   Procedure: ESOPHAGOGASTRODUODENOSCOPY (EGD);  Surgeon: Daneil Dolin, MD;  Location: [facility];  Service: Endoscopy;  Laterality: N/A;  1:30  . MALONEY DILATION  10/28/2011   Procedure: Venia Minks  DILATION;  Surgeon: Daneil Dolin, MD;  Location: [facility];  Service: Endoscopy;  Laterality: N/A;  . SAVORY DILATION  10/28/2011   Procedure: SAVORY DILATION;  Surgeon: Daneil Dolin, MD;  Location: [facility];  Service: Endoscopy;  Laterality: N/A;  . TONSILLECTOMY    . TOTAL ANKLE ARTHROPLASTY Left 11/25/2013   Procedure: TOTAL ANKLE ARTHOPLASTY;  Surgeon: Wylene Simmer, MD;  Location: [place];  Service: Orthopedics;  Laterality: Left;  . TOTAL ANKLE ARTHROPLASTY Right 07/14/2014   dr hewitt  . TOTAL ANKLE ARTHROPLASTY Right 07/14/2014   Procedure: RIGHT TOTAL ANKLE ARTHOPLASTY;  Surgeon: Wylene Simmer, MD;  Location: [place];  Service: Orthopedics;  Laterality: Right;       Inpatient Medications: Scheduled Meds: . apixaban  5 mg Oral BID  . finasteride  5 mg Oral Daily  . gabapentin  300 mg Oral BID  . levothyroxine  75 mcg Oral QAC breakfast  . omega-3 acid ethyl esters  1,000 mg Oral Daily  . pantoprazole  40 mg Oral Daily  . sodium chloride flush  3 mL Intravenous Q12H  . tamsulosin  0.4 mg Oral QPC breakfast  . vitamin B-12  1,000 mcg Oral Daily   Continuous Infusions: . sodium chloride 75 mL/hr at 03/21/17 0932   PRN Meds: fluticasone, polyvinyl alcohol  Allergies:    Allergies  Allergen Reactions  . Oxycodone     Severe constipation  . Statins     Hard to walk, severe leg cramps  . Tetanus Toxoids     Swells up  . Doxycycline Rash  . Penicillins Swelling and Rash    .Has patient had a PCN reaction causing immediate rash, facial/tongue/throat swelling, SOB or lightheadedness with hypotension: Yes Has patient had a PCN reaction causing severe rash involving mucus membranes or skin necrosis: No Has patient had a PCN reaction that required hospitalization: Yes Has patient had a PCN reaction occurring within the last 10 years: No If all of the above answers are "NO", then may proceed with Cephalosporin use.   . Sulfa Antibiotics Swelling and Rash    Social  History:   Social History   Social History  . Marital status: Married    Spouse name: N/A  . Number of children: 2  . Years of education: N/A   Occupational History  . retired; Nurse, learning disability Retired   Social History Main Topics  . Smoking status: Never Smoker  . Smokeless tobacco: Never Used  . Alcohol use 0.6 - 1.2 oz/week    1 - 2 Cans of beer per week     Comment: 2 beers a week  . Drug use: No  . Sexual activity: Not on file   Other Topics Concern  . Not on file   Social History Narrative  . No narrative on file    Family History:    Family History  Problem Relation Age of Onset  . Prostate cancer Father   . Prostate cancer Son 36  . Prostate cancer Brother   .  Colon cancer Neg Hx      ROS:  Please see the history of present illness.  ROS  All other ROS reviewed and negative.     Physical Exam/Data:   Vitals:   03/20/17 2330 03/21/17 0030 03/21/17 0100 03/21/17 0149  BP: (!) 173/111 (!) 170/98 (!) 160/102 (!) 161/70  Pulse: 82 73  68  Resp: 14 13 18 16   Temp:      TempSrc:      SpO2: 98% 96%  98%  Weight:    193 lb (87.5 kg)  Height:        Intake/Output Summary (Last 24 hours) at 03/21/17 1025 Last data filed at 03/21/17 0949  Gross per 24 hour  Intake              243 ml  Output                0 ml  Net              243 ml   Filed Weights   03/20/17 1825 03/21/17 0149  Weight: 192 lb (87.1 kg) 193 lb (87.5 kg)   Body mass index is 27.69 kg/m.  General:  Well nourished, well developed, in no acute distress HEENT: Right eye is bruised and blackened but not closed shut. ACE wrap around his head with red staining on the occipital region. No active bleeding.  Lymph: no adenopathy Neck: no JVD Endocrine:  No thryomegaly Vascular: No carotid bruits; FA pulses 2+ bilaterally without bruits  Cardiac:  normal S1, S2; RRR extra systole is noted; no murmur  Lungs:  clear to auscultation bilaterally, no wheezing, rhonchi or rales  Abd: soft,  nontender, no hepatomegaly  Ext: no edema Musculoskeletal:   equal Skin: warm and dry  Neuro:  CNs 2-12 intact, no focal abnormalities noted Psych:  Normal affect   EKG:  The EKG was personally reviewed and demonstrates:  Sinus rhythm with right bundle-branch block heart rate of 73 bpm. Telemetry:  Telemetry was personally reviewed and demonstrates:  Normal sinus rhythm heart rate in the 70s with PVCs. Relevant CV Studies: Echocardiogram 04/26/2015 Left ventricle: The cavity size was normal. Wall thickness was   increased increased in a pattern of mild to moderate LVH.   Systolic function was normal. The estimated ejection fraction was   in the range of 60% to 65%. Wall motion was normal; there were no   regional wall motion abnormalities. - Aortic valve: Valve area (VTI): 2.18 cm^2. Valve area (Vmax):   2.13 cm^2. - Mitral valve: There was mild regurgitation. - Left atrium: The atrium was severely dilated. - Right ventricle: The cavity size was mildly dilated. - Right atrium: The atrium was severely dilated. - Atrial septum: No defect or patent foramen ovale was identified. - Pulmonary arteries: Systolic pressure was mildly increased. PA   peak pressure: 34 mm Hg (S).  Holter Monitor 12/13/2015 Study Highlights    Predominant rhythm is atrial fibrillation  Rare ventricular ectopy  Min HR 42, Max HR 103. Avg HR 67. Occasional 2.4-2.6 second pauses,all in early AM hours presumably while sleeping  Reported symptoms correlated with afib with normal heart rates   Doppler Arterial US of the LE with ABI 12/04/2016 oh FINDINGS: Right ABI:  1.2 Left ABI:  1.2 IMPRESSION: 1. Normal bilateral resting ankle-brachial indices and triphasic arterial waveforms. 2. No evidence of significant occlusive disease to the level of the knees bilaterally. 3. Difficult to exclude an element of runoff (  below the knee) disease.   Laboratory Data: Chemistry Recent Labs Lab 03/20/17 1834  NA  140  K 4.2  CL 107  CO2 24  GLUCOSE 119*  BUN 20  CREATININE 1.22  CALCIUM 9.2  GFRNONAA 55*  GFRAA >60  ANIONGAP 9    No results for input(s): PROT, ALBUMIN, AST, ALT, ALKPHOS, BILITOT in the last 168 hours. Hematology Recent Labs Lab 03/20/17 1834  WBC 7.3  RBC 4.76  HGB 15.0  HCT 43.5  MCV 91.4  MCH 31.5  MCHC 34.5  RDW 13.8  PLT 183   Cardiac Enzymes Recent Labs Lab 03/20/17 1834  TROPONINI <0.03   No results for input(s): TROPIPOC in the last 168 hours.  BNPNo results for input(s): BNP, PROBNP in the last 168 hours.  DDimer No results for input(s): DDIMER in the last 168 hours.  Radiology/Studies:  Dg Chest 2 View  Result Date: 03/20/2017 CLINICAL DATA:  Syncopal episode, striking head today. EXAM: CHEST  2 VIEW COMPARISON:  11/15/2013 FINDINGS: Heart size at the upper limits of normal. Aortic atherosclerosis. The pulmonary vascularity is normal. The lungs are clear. No effusions. No acute bone finding. IMPRESSION: No active disease.  Aortic atherosclerosis. Electronically Signed   By: [name] Chimes M.D.   On: 03/20/2017 19:16   Ct Head Wo Contrast  Result Date: 03/20/2017 CLINICAL DATA:  76 year old male with history of syncopal event injuring head on metal pole. Laceration to the back of the head. EXAM: CT HEAD WITHOUT CONTRAST CT CERVICAL SPINE WITHOUT CONTRAST TECHNIQUE: Multidetector CT imaging of the head and cervical spine was performed following the standard protocol without intravenous contrast. Multiplanar CT image reconstructions of the cervical spine were also generated. COMPARISON:  Head CT 04/29/2011. FINDINGS: CT HEAD FINDINGS Brain: Patchy and confluent areas of decreased attenuation are noted throughout the deep and periventricular white matter of the cerebral hemispheres bilaterally, compatible with chronic microvascular ischemic disease. Physiologic calcifications in the left basal ganglia. No evidence of acute infarction, hemorrhage, hydrocephalus,  extra-axial collection or mass lesion/mass effect. Vascular: No hyperdense vessel or unexpected calcification. Skull: Normal. Negative for fracture or focal lesion. Sinuses/Orbits: No acute finding. Other: Small amount of soft tissue swelling in the right frontal scalp, presumably a hematoma. Bandage overlying the low left parietal scalp where there is also some mild soft tissue swelling. CT CERVICAL SPINE FINDINGS Alignment: Normal. Skull base and vertebrae: No acute fracture. No primary bone lesion or focal pathologic process. Soft tissues and spinal canal: No prevertebral fluid or swelling. No visible canal hematoma. Disc levels: Mild multilevel degenerative disc disease, most apparent at C6-C7. Mild multilevel facet arthropathy. Upper chest: Unremarkable. Other: None. IMPRESSION: 1. Small scalp contusion in the left parietal region and small right frontal scalp hematoma. No underlying displaced skull fracture, evidence of significant acute traumatic injury to the brain, or acute abnormality of the cervical spine. 2. Mild chronic microvascular ischemic changes in the cerebral white matter again noted. 3. Mild multilevel degenerative disc disease and cervical spondylosis. Electronically Signed   By: Vinnie Langton M.D.   On: 03/20/2017 19:26   Ct Cervical Spine Wo Contrast  Result Date: 03/20/2017 CLINICAL DATA:  76 year old male with history of syncopal event injuring head on metal pole. Laceration to the back of the head. EXAM: CT HEAD WITHOUT CONTRAST CT CERVICAL SPINE WITHOUT CONTRAST TECHNIQUE: Multidetector CT imaging of the head and cervical spine was performed following the standard protocol without intravenous contrast. Multiplanar CT image reconstructions of the cervical spine were  also generated. COMPARISON:  Head CT 04/29/2011. FINDINGS: CT HEAD FINDINGS Brain: Patchy and confluent areas of decreased attenuation are noted throughout the deep and periventricular white matter of the cerebral  hemispheres bilaterally, compatible with chronic microvascular ischemic disease. Physiologic calcifications in the left basal ganglia. No evidence of acute infarction, hemorrhage, hydrocephalus, extra-axial collection or mass lesion/mass effect. Vascular: No hyperdense vessel or unexpected calcification. Skull: Normal. Negative for fracture or focal lesion. Sinuses/Orbits: No acute finding. Other: Small amount of soft tissue swelling in the right frontal scalp, presumably a hematoma. Bandage overlying the low left parietal scalp where there is also some mild soft tissue swelling. CT CERVICAL SPINE FINDINGS Alignment: Normal. Skull base and vertebrae: No acute fracture. No primary bone lesion or focal pathologic process. Soft tissues and spinal canal: No prevertebral fluid or swelling. No visible canal hematoma. Disc levels: Mild multilevel degenerative disc disease, most apparent at C6-C7. Mild multilevel facet arthropathy. Upper chest: Unremarkable. Other: None. IMPRESSION: 1. Small scalp contusion in the left parietal region and small right frontal scalp hematoma. No underlying displaced skull fracture, evidence of significant acute traumatic injury to the brain, or acute abnormality of the cervical spine. 2. Mild chronic microvascular ischemic changes in the cerebral white matter again noted. 3. Mild multilevel degenerative disc disease and cervical spondylosis. Electronically Signed   By: Vinnie Langton M.D.   On: 03/20/2017 19:26    Assessment and Plan:   1. Syncope: Review of home medications has him on propanolol 10 mg daily, amlodipine, 5 mg, Proscar 5 mg daily, Flomax 0.4 mg daily, all of which can contribute to dizziness, hypotension, and possible syncope. EKG and telemetry do not reveal bradycardia as had been previously documented per Holter monitor a year ago. He was not hypotensive on arrival to the ER. In fact, since admission blood pressure has been elevated.  Since admission, propanolol,  and amlodipine have been discontinued. He remains on Flomax and Proscar. He is not on a diuretic. He denies any further dizziness but has pretty much stayed in the bed since admission. Orthostatic blood pressures have not been completed.  Agree with holding propanolol. He did have a positive tilt test in the past. Would avoid vasodilators. No indication for pacemaker at this time. Can consider loop recorder as outpatient if clinically warranted.  2. Hypertension: Blood pressure has been elevated since admission after being taken off of propanolol and amlodipine. May restart low dose ACE for BP control. Lisinopril 2.5 mg daily. Will discuss with Dr. Bronson Ing first before starting.  3. PAF: He remains in normal sinus rhythm currently. Occasional PVCs. The patient continues on anticoagulation therapy. CHADS VASC Score of 2. With syncopal episodes there is a concern for continuing anticoagulation. However, he has not had a syncopal episode in several years. He has been having some dizziness however. We'll continue ELIQUIS for now.  4. Hypothyroidism: TSH is normal at 2.125. Continues on Synthroid 75 g daily.      Signed, Jory Sims DNP, ANP. AACC  03/21/2017 10:25 AM   The patient was seen and examined, and I agree with the history, physical exam, assessment and plan as documented above, with modifications as noted below. I have also personally reviewed all relevant documentation, old records, labs, and both radiographic and cardiovascular studies. I have also independently interpreted old and new ECG's.  76 yr old male with h/o PAF and remote h/o syncope (15 yrs ago) admitted after syncopal episode. Details surrounding history noted above and reviewed.  There was  no antecedent chest pain, shortness of breath, dizziness, or palpitations. Thinks he lost consciousness for approximately 2 minutes. Thus far, testing has been unremarkable. ECG shows NSR with RBBB. No acute intracranial  pathology on CT. TSH, troponins, CBC all normal.  Has had some dizziness and fatigue and HR's in low 40 bpm range during these episodes, all within the last 2 weeks. Denies any near syncope.  Propranolol and amlodipine currently on hold.  Recommendations: I wonder if this episode was due to a bradyarrhythmia, given recent episodes of fatigue corresponding to HR in low 40 bpm range. Propranolol (and amlodipine) on hold. Avoid AV nodal blocking agents. Hypertensive early this morning. I have asked nurse to recheck BP. Consider ACEI/ARB for future antihypertensive regimen. I will arrange for 30-day event monitor at time of discharge. No driving. If event monitor is unrevealing, I would consider an implantable loop recorder. Plan discussed with patient and wife.    Kate Sable, MD, [facility]  03/21/2017 12:43 PM

## 2017-03-21 NOTE — Care Management Obs Status (Signed)
[place] NOTIFICATION   Patient Details  Name: PIO EATHERLY MRN: [id number] Date of Birth: [birth year]   Medicare Observation Status Notification Given:  Yes    Sherald Barge, RN 03/21/2017, 1:40 PM

## 2017-03-21 NOTE — Progress Notes (Signed)
PROGRESS NOTE  Brent Wilkins HLK:[id number] DOB: 1941-07-06 DOA: 03/20/2017 PCP: [employer], MD  Brief History:  76 year old male with a history of paroxysmal atrial fibrillation on apixaban, recurrent syncope with positive tilt table test, hypothyroidism, hypertension, hyperlipidemia presenting after syncopal episode. 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 While eating a hotdog in the late afternoon on 03/20/2017, the patient had a syncopal episode while he was standing up without any prodromal symptoms. The patient says he has not had any syncopal episodes for nearly 15 years. Prior to these events, the patient has been in his usual state of health without any complaints. He denies any fevers, chills, chest discomfort, shortness breath, palpitations, dizziness, nausea, vomiting, diarrhea. He states that he has been eating and drinking well. He endorses compliance with his medications. Because of the patient's syncope, the patient was admitted for further evaluation.  Assessment/Plan: Syncope -Suspicious for cardiogenic syncope given that he had positive orthostatics -Consult cardiology -Remain on telemetry -Allow for permissive hypertension -Discontinue propranolol and amlodipine -Echocardiogram -12/13/2015 Holter monitor--predominate rhythm atrial fibrillation with 2.4-2.6 second pauses presumably during sleep; HR 42-103  Orthostatic hypotension -Blood pressure dropped from 186/97 to 167/107 from sitting to standing -judicious IVF -allow for permissive hypertension  Hypothyroidism -Continue Synthroid -TSH 2.125  Paroxysmal atrial fibrillation -CHADS-VASc = 3 -rate controlled -continue apixaban  Essential Hypertension -allow for permissive hypertension in setting of syncope and orthostasis  Scalp laceration -staples placed in ED  CKD stage 2 -baseline creatinine  1.0-1.2    Disposition Plan:   Home when cleared by cardiology Family Communication: no Family at bedside  Consultants:  cardiology  Code Status:  FULL   DVT Prophylaxis: apixaban   Procedures: As Listed in Progress Note Above  Antibiotics: None    Subjective: Patient denies fevers, chills, headache, chest pain, dyspnea, nausea, vomiting, diarrhea, abdominal pain, dysuria, hematuria, hematochezia, and melena.   Objective: Vitals:   03/20/17 2330 03/21/17 0030 03/21/17 0100 03/21/17 0149  BP: (!) 173/111 (!) 170/98 (!) 160/102 (!) 161/70  Pulse: 82 73  68  Resp: 14 13 18 16   Temp:      TempSrc:      SpO2: 98% 96%  98%  Weight:    87.5 kg (193 lb)  Height:        Intake/Output Summary (Last 24 hours) at 03/21/17 7342 Last data filed at 03/21/17 0208  Gross per 24 hour  Intake                3 ml  Output                0 ml  Net                3 ml   Weight change:  Exam:   General:  Pt is alert, follows commands appropriately, not in acute distress  HEENT: No icterus, No thrush, No neck mass, [place]/AT  Cardiovascular: IRRR, S1/S2, no rubs, no gallops  Respiratory: CTA bilaterally, no wheezing, no crackles, no rhonchi  Abdomen: Soft/+BS, non tender, non distended, no guarding  Extremities: No edema, No lymphangitis, No petechiae, No rashes, no synovitis   Data Reviewed: I have personally reviewed following labs and imaging studies Basic Metabolic Panel:  Recent Labs Lab 03/20/17 1834  NA 140  K 4.2  CL 107  CO2 24  GLUCOSE 119*  BUN 20  CREATININE 1.22  CALCIUM 9.2   Liver Function Tests: No results for input(s): AST, ALT, ALKPHOS, BILITOT, PROT, ALBUMIN in the last 168 hours. No results for input(s): LIPASE, AMYLASE in the last 168 hours. No results for input(s): AMMONIA in the last 168 hours. Coagulation Profile: No results for input(s): INR, PROTIME in the last 168 hours. CBC:  Recent Labs Lab 03/20/17 1834  WBC 7.3  NEUTROABS  4.2  HGB 15.0  HCT 43.5  MCV 91.4  PLT 183   Cardiac Enzymes:  Recent Labs Lab 03/20/17 1834 03/20/17 1840  CKTOTAL  --  71  TROPONINI <0.03  --    BNP: Invalid input(s): POCBNP CBG:  Recent Labs Lab 03/20/17 1840  GLUCAP 113*   HbA1C: No results for input(s): HGBA1C in the last 72 hours. Urine analysis:    Component Value Date/Time   COLORURINE YELLOW 03/20/2017 1845   APPEARANCEUR CLEAR 03/20/2017 1845   LABSPEC 1.024 03/20/2017 1845   PHURINE 5.0 03/20/2017 1845   GLUCOSEU NEGATIVE 03/20/2017 1845   HGBUR NEGATIVE 03/20/2017 1845   BILIRUBINUR NEGATIVE 03/20/2017 1845   KETONESUR NEGATIVE 03/20/2017 1845   PROTEINUR NEGATIVE 03/20/2017 1845   NITRITE NEGATIVE 03/20/2017 1845   LEUKOCYTESUR NEGATIVE 03/20/2017 1845   Sepsis Labs: @LABRCNTIP (procalcitonin:4,lacticidven:4) )No results found for this or any previous visit (from the past 240 hour(s)).   Scheduled Meds: . apixaban  5 mg Oral BID  . finasteride  5 mg Oral Daily  . gabapentin  300 mg Oral BID  . levothyroxine  75 mcg Oral QAC breakfast  . omega-3 acid ethyl esters  1,000 mg Oral Daily  . pantoprazole  40 mg Oral Daily  . sodium chloride flush  3 mL Intravenous Q12H  . tamsulosin  0.4 mg Oral QPC breakfast  . vitamin B-12  1,000 mcg Oral Daily   Continuous Infusions:  Procedures/Studies: Dg Chest 2 View  Result Date: 03/20/2017 CLINICAL DATA:  Syncopal episode, striking head today. EXAM: CHEST  2 VIEW COMPARISON:  11/15/2013 FINDINGS: Heart size at the upper limits of normal. Aortic atherosclerosis. The pulmonary vascularity is normal. The lungs are clear. No effusions. No acute bone finding. IMPRESSION: No active disease.  Aortic atherosclerosis. Electronically Signed   By: [name] Chimes M.D.   On: 03/20/2017 19:16   Ct Head Wo Contrast  Result Date: 03/20/2017 CLINICAL DATA:  76 year old male with history of syncopal event injuring head on metal pole. Laceration to the back of the head.  EXAM: CT HEAD WITHOUT CONTRAST CT CERVICAL SPINE WITHOUT CONTRAST TECHNIQUE: Multidetector CT imaging of the head and cervical spine was performed following the standard protocol without intravenous contrast. Multiplanar CT image reconstructions of the cervical spine were also generated. COMPARISON:  Head CT 04/29/2011. FINDINGS: CT HEAD FINDINGS Brain: Patchy and confluent areas of decreased attenuation are noted throughout the deep and periventricular white matter of the cerebral hemispheres bilaterally, compatible with chronic microvascular ischemic disease. Physiologic calcifications in the left basal ganglia. No evidence of acute infarction, hemorrhage, hydrocephalus, extra-axial collection or mass lesion/mass effect. Vascular: No hyperdense vessel or unexpected calcification. Skull: Normal. Negative for fracture or focal lesion. Sinuses/Orbits: No acute finding. Other: Small amount of soft tissue swelling in the right frontal scalp, presumably a hematoma. Bandage overlying the low left parietal scalp where there is also some mild soft tissue swelling. CT CERVICAL SPINE FINDINGS Alignment: Normal. Skull base and vertebrae: No acute fracture. No primary bone lesion or focal pathologic process. Soft tissues  and spinal canal: No prevertebral fluid or swelling. No visible canal hematoma. Disc levels: Mild multilevel degenerative disc disease, most apparent at C6-C7. Mild multilevel facet arthropathy. Upper chest: Unremarkable. Other: None. IMPRESSION: 1. Small scalp contusion in the left parietal region and small right frontal scalp hematoma. No underlying displaced skull fracture, evidence of significant acute traumatic injury to the brain, or acute abnormality of the cervical spine. 2. Mild chronic microvascular ischemic changes in the cerebral white matter again noted. 3. Mild multilevel degenerative disc disease and cervical spondylosis. Electronically Signed   By: Vinnie Langton M.D.   On: 03/20/2017 19:26     Ct Cervical Spine Wo Contrast  Result Date: 03/20/2017 CLINICAL DATA:  76 year old male with history of syncopal event injuring head on metal pole. Laceration to the back of the head. EXAM: CT HEAD WITHOUT CONTRAST CT CERVICAL SPINE WITHOUT CONTRAST TECHNIQUE: Multidetector CT imaging of the head and cervical spine was performed following the standard protocol without intravenous contrast. Multiplanar CT image reconstructions of the cervical spine were also generated. COMPARISON:  Head CT 04/29/2011. FINDINGS: CT HEAD FINDINGS Brain: Patchy and confluent areas of decreased attenuation are noted throughout the deep and periventricular white matter of the cerebral hemispheres bilaterally, compatible with chronic microvascular ischemic disease. Physiologic calcifications in the left basal ganglia. No evidence of acute infarction, hemorrhage, hydrocephalus, extra-axial collection or mass lesion/mass effect. Vascular: No hyperdense vessel or unexpected calcification. Skull: Normal. Negative for fracture or focal lesion. Sinuses/Orbits: No acute finding. Other: Small amount of soft tissue swelling in the right frontal scalp, presumably a hematoma. Bandage overlying the low left parietal scalp where there is also some mild soft tissue swelling. CT CERVICAL SPINE FINDINGS Alignment: Normal. Skull base and vertebrae: No acute fracture. No primary bone lesion or focal pathologic process. Soft tissues and spinal canal: No prevertebral fluid or swelling. No visible canal hematoma. Disc levels: Mild multilevel degenerative disc disease, most apparent at C6-C7. Mild multilevel facet arthropathy. Upper chest: Unremarkable. Other: None. IMPRESSION: 1. Small scalp contusion in the left parietal region and small right frontal scalp hematoma. No underlying displaced skull fracture, evidence of significant acute traumatic injury to the brain, or acute abnormality of the cervical spine. 2. Mild chronic microvascular ischemic  changes in the cerebral white matter again noted. 3. Mild multilevel degenerative disc disease and cervical spondylosis. Electronically Signed   By: Vinnie Langton M.D.   On: 03/20/2017 19:26    Kacelyn Rowzee, DO  [facility] Pager [phone]  If 7PM-7AM, please contact night-coverage [email or web address] Password [email or web address] 03/21/2017, 9:06 AM   LOS: 0 days

## 2017-03-21 NOTE — Progress Notes (Signed)
Dressing to Posterior head changed.  Old dressing noted to be mildly saturated with blood.  Cleaned with NS, dried and placed non-adherent dressing covered by Abdominal pad and taped to head at patients request.  Stable condition with no c/o

## 2017-03-21 NOTE — Progress Notes (Signed)
Dressing remains intact to entire surface of head with no drainage noted.  Right eye discolored Black/purple.  No syncopal episodes this shift.  Remains alert/oriented x 4.  No c/o. Will continue to monitor.

## 2017-03-22 DIAGNOSIS — N182 Chronic kidney disease, stage 2 (mild): Secondary | ICD-10-CM | POA: Diagnosis not present

## 2017-03-22 DIAGNOSIS — R55 Syncope and collapse: Secondary | ICD-10-CM | POA: Diagnosis not present

## 2017-03-22 DIAGNOSIS — I4891 Unspecified atrial fibrillation: Secondary | ICD-10-CM | POA: Diagnosis not present

## 2017-03-22 DIAGNOSIS — I951 Orthostatic hypotension: Secondary | ICD-10-CM | POA: Diagnosis not present

## 2017-03-22 DIAGNOSIS — I10 Essential (primary) hypertension: Secondary | ICD-10-CM | POA: Diagnosis not present

## 2017-03-22 LAB — BASIC METABOLIC PANEL
ANION GAP: 4 — AB (ref 5–15)
BUN: 12 mg/dL (ref 6–20)
CALCIUM: 8.9 mg/dL (ref 8.9–10.3)
CO2: 28 mmol/L (ref 22–32)
Chloride: 114 mmol/L — ABNORMAL HIGH (ref 101–111)
Creatinine, Ser: 1.09 mg/dL (ref 0.61–1.24)
GFR calc Af Amer: >60 mL/min (ref >60)
GFR calc non Af Amer: >60 mL/min (ref >60)
GLUCOSE: 93 mg/dL (ref 65–99)
Potassium: 3.9 mmol/L (ref 3.5–5.1)
Sodium: 146 mmol/L — ABNORMAL HIGH (ref 135–145)

## 2017-03-22 LAB — MAGNESIUM: Magnesium: 1.9 mg/dL (ref 1.7–2.4)

## 2017-03-22 MED ORDER — LOSARTAN POTASSIUM 25 MG PO TABS: 25.0000 mg | ORAL_TABLET | Freq: Every day | ORAL | 0 refills | Status: DC

## 2017-03-22 NOTE — Progress Notes (Signed)
Discharge instructions given, verbalized understanding, out in stable condition via w/c with staff. 

## 2017-03-22 NOTE — Discharge Summary (Addendum)
Physician Discharge Summary  Brent Wilkins MGQ:[id number] DOB: 1940-11-17 DOA: 03/20/2017  PCP: Redmond School, MD  Admit date: 03/20/2017 Discharge date: 03/22/2017  Admitted From: Home Disposition:  Home  Recommendations for Outpatient Follow-up:  1. Follow up with PCP in 1-2 weeks 2. Please obtain BMP/CBC in one week    Discharge Condition: Stable CODE STATUS:FULL Diet recommendation: Heart Healthy   Brief/Interim Summary: 76 year old male with a history of paroxysmal atrial fibrillation on apixaban, recurrent syncope with positive tilt table test, hypothyroidism, hypertension, hyperlipidemia presenting after syncopal episode. 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 While eating a hotdog in the late afternoon on 03/20/2017, the patient had a syncopal episode while he was standing up without any prodromal symptoms. The patient says he has not had any syncopal episodes for nearly 15 years. Prior to these events, the patient has been in his usual state of health without any complaints. He denies any fevers, chills, chest discomfort, shortness breath, palpitations, dizziness, nausea, vomiting, diarrhea. He states that he has been eating and drinking well. He endorses compliance with his medications. Because of the patient's syncope, the patient was admitted for further evaluation.  Discharge Diagnoses:  Syncope -Suspicious for cardiogenic syncope given that he had positive orthostatics -Consulted cardiology--plans for 30 day event monitor after discharge; concerned patient may have bradyarrhythmia -Remain on telemetry -Allow for permissive hypertension -Discontinue propranolol and amlodipine -d/c home with losartan 25 mg daily -Echocardiogram--EF 60-65%, no WMA, mild decreased RV function, mild to moderate TR -12/13/2015 Holter monitor--predominate rhythm atrial fibrillation with 2.4-2.6  second pauses presumably during sleep; HR 42-103 -during hospitalization, no concerning dysrhythmia on telemetry -I informed patient he cannot drive until he is cleared by cardiology  Orthostatic hypotension -Blood pressure dropped from 186/97 to 167/107 from sitting to standing -judicious IVF -03/22/17--repeat orthostatics negative;  Laying 178/102 HR 72; sitting 189/88 HR 67; stand 168/89 HR 78  Hypothyroidism -Continue Synthroid -TSH 2.125  Paroxysmal atrial fibrillation -CHADS-VASc = 3 -rate controlled -continue apixaban  Essential Hypertension -allow for permissive hypertension in setting of syncope and orthostasis  Scalp laceration -staples placed in ED  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   Discharge Instructions  Discharge Instructions    Diet - low sodium heart healthy    Complete by:  As directed    Increase activity slowly    Complete by:  As directed      Allergies as of 03/22/2017      Reactions   Oxycodone    Severe constipation   Statins    Hard to walk, severe leg cramps   Tetanus Toxoids    Swells up   Doxycycline Rash   Penicillins Swelling, Rash   .Has patient had a PCN reaction causing immediate rash, facial/tongue/throat swelling, SOB or lightheadedness with hypotension: Yes Has patient had a PCN reaction causing severe rash involving mucus membranes or skin necrosis: No Has patient had a PCN reaction that required hospitalization: Yes Has patient had a PCN reaction occurring within the last 10 years: No If all of the above answers are "NO", then may proceed with Cephalosporin use.   Sulfa Antibiotics Swelling, Rash      Medication List    STOP taking these medications   amLODipine 5 MG tablet Commonly known as:  NORVASC   celecoxib 100 MG capsule Commonly known as:  CELEBREX   Potassium 99 MG Tabs   propranolol  10 MG tablet Commonly known as:  INDERAL     TAKE these medications   ELIQUIS 5 MG Tabs tablet Generic  drug:  apixaban TAKE 1 TABLET BY MOUTH TWO  TIMES DAILY   finasteride 5 MG tablet Commonly known as:  PROSCAR Take 5 mg by mouth daily.   fluticasone 50 MCG/ACT nasal spray Commonly known as:  FLONASE Place 1 spray into both nostrils daily as needed for allergies.   gabapentin 300 MG capsule Commonly known as:  NEURONTIN Take 300 mg by mouth 2 (two) times daily.   levothyroxine 75 MCG tablet Commonly known as:  SYNTHROID, LEVOTHROID Take 75 mcg by mouth daily.   losartan 25 MG tablet Commonly known as:  COZAAR Take 1 tablet (25 mg total) by mouth daily.   omeprazole 20 MG capsule Commonly known as:  PRILOSEC TAKE 1 CAPSULE BY MOUTH 2  TIMES DAILY BEFORE A MEAL   RA FISH OIL 1400 MG Cpdr Take 1,400 mg by mouth daily.   SINUS HEADACHE/NON-DROWSY PO Take 2 tablets by mouth daily as needed.   SYSTANE 0.4-0.3 % Soln Generic drug:  Polyethyl Glycol-Propyl Glycol Place 1-2 drops into both eyes 2 (two) times daily as needed.   tamsulosin 0.4 MG Caps capsule Commonly known as:  FLOMAX Take 0.4 mg by mouth daily after breakfast.   vitamin B-12 500 MCG tablet Commonly known as:  CYANOCOBALAMIN Take 1,000 mcg by mouth daily.       Allergies  Allergen Reactions  . Oxycodone     Severe constipation  . Statins     Hard to walk, severe leg cramps  . Tetanus Toxoids     Swells up  . Doxycycline Rash  . Penicillins Swelling and Rash    .Has patient had a PCN reaction causing immediate rash, facial/tongue/throat swelling, SOB or lightheadedness with hypotension: Yes Has patient had a PCN reaction causing severe rash involving mucus membranes or skin necrosis: No Has patient had a PCN reaction that required hospitalization: Yes Has patient had a PCN reaction occurring within the last 10 years: No If all of the above answers are "NO", then may proceed with Cephalosporin use.   . Sulfa Antibiotics Swelling and Rash     Consultations:  cardiology   Procedures/Studies: Dg Chest 2 View  Result Date: 03/20/2017 CLINICAL DATA:  Syncopal episode, striking head today. EXAM: CHEST  2 VIEW COMPARISON:  11/15/2013 FINDINGS: Heart size at the upper limits of normal. Aortic atherosclerosis. The pulmonary vascularity is normal. The lungs are clear. No effusions. No acute bone finding. IMPRESSION: No active disease.  Aortic atherosclerosis. Electronically Signed   By: [name] Chimes M.D.   On: 03/20/2017 19:16   Ct Head Wo Contrast  Result Date: 03/20/2017 CLINICAL DATA:  76 year old male with history of syncopal event injuring head on metal pole. Laceration to the back of the head. EXAM: CT HEAD WITHOUT CONTRAST CT CERVICAL SPINE WITHOUT CONTRAST TECHNIQUE: Multidetector CT imaging of the head and cervical spine was performed following the standard protocol without intravenous contrast. Multiplanar CT image reconstructions of the cervical spine were also generated. COMPARISON:  Head CT 04/29/2011. FINDINGS: CT HEAD FINDINGS Brain: Patchy and confluent areas of decreased attenuation are noted throughout the deep and periventricular white matter of the cerebral hemispheres bilaterally, compatible with chronic microvascular ischemic disease. Physiologic calcifications in the left basal ganglia. No evidence of acute infarction, hemorrhage, hydrocephalus, extra-axial collection or mass lesion/mass effect. Vascular: No hyperdense vessel or unexpected calcification. Skull: Normal. Negative for  fracture or focal lesion. Sinuses/Orbits: No acute finding. Other: Small amount of soft tissue swelling in the right frontal scalp, presumably a hematoma. Bandage overlying the low left parietal scalp where there is also some mild soft tissue swelling. CT CERVICAL SPINE FINDINGS Alignment: Normal. Skull base and vertebrae: No acute fracture. No primary bone lesion or focal pathologic process. Soft tissues and spinal canal: No prevertebral fluid  or swelling. No visible canal hematoma. Disc levels: Mild multilevel degenerative disc disease, most apparent at C6-C7. Mild multilevel facet arthropathy. Upper chest: Unremarkable. Other: None. IMPRESSION: 1. Small scalp contusion in the left parietal region and small right frontal scalp hematoma. No underlying displaced skull fracture, evidence of significant acute traumatic injury to the brain, or acute abnormality of the cervical spine. 2. Mild chronic microvascular ischemic changes in the cerebral white matter again noted. 3. Mild multilevel degenerative disc disease and cervical spondylosis. Electronically Signed   By: Vinnie Langton M.D.   On: 03/20/2017 19:26   Ct Cervical Spine Wo Contrast  Result Date: 03/20/2017 CLINICAL DATA:  76 year old male with history of syncopal event injuring head on metal pole. Laceration to the back of the head. EXAM: CT HEAD WITHOUT CONTRAST CT CERVICAL SPINE WITHOUT CONTRAST TECHNIQUE: Multidetector CT imaging of the head and cervical spine was performed following the standard protocol without intravenous contrast. Multiplanar CT image reconstructions of the cervical spine were also generated. COMPARISON:  Head CT 04/29/2011. FINDINGS: CT HEAD FINDINGS Brain: Patchy and confluent areas of decreased attenuation are noted throughout the deep and periventricular white matter of the cerebral hemispheres bilaterally, compatible with chronic microvascular ischemic disease. Physiologic calcifications in the left basal ganglia. No evidence of acute infarction, hemorrhage, hydrocephalus, extra-axial collection or mass lesion/mass effect. Vascular: No hyperdense vessel or unexpected calcification. Skull: Normal. Negative for fracture or focal lesion. Sinuses/Orbits: No acute finding. Other: Small amount of soft tissue swelling in the right frontal scalp, presumably a hematoma. Bandage overlying the low left parietal scalp where there is also some mild soft tissue swelling. CT  CERVICAL SPINE FINDINGS Alignment: Normal. Skull base and vertebrae: No acute fracture. No primary bone lesion or focal pathologic process. Soft tissues and spinal canal: No prevertebral fluid or swelling. No visible canal hematoma. Disc levels: Mild multilevel degenerative disc disease, most apparent at C6-C7. Mild multilevel facet arthropathy. Upper chest: Unremarkable. Other: None. IMPRESSION: 1. Small scalp contusion in the left parietal region and small right frontal scalp hematoma. No underlying displaced skull fracture, evidence of significant acute traumatic injury to the brain, or acute abnormality of the cervical spine. 2. Mild chronic microvascular ischemic changes in the cerebral white matter again noted. 3. Mild multilevel degenerative disc disease and cervical spondylosis. Electronically Signed   By: Vinnie Langton M.D.   On: 03/20/2017 19:26        Discharge Exam: Vitals:   03/22/17 0458 03/22/17 0500  BP: (!) 162/63 (!) 178/90  Pulse: 64   Resp: 18   Temp: 98.4 F (36.9 C)   SpO2: 98%    Vitals:   03/21/17 1444 03/21/17 2100 03/22/17 0458 03/22/17 0500  BP: (!) 154/85 (!) 172/81 (!) 162/63 (!) 178/90  Pulse: 62 70 64   Resp: 16 20 18    Temp: 97.9 F (36.6 C)  98.4 F (36.9 C)   TempSrc: Oral  Oral   SpO2: 98% 100% 98%   Weight:      Height:        General: Pt is alert, awake, not in acute  distress Cardiovascular: RRR, S1/S2 +, no rubs, no gallops Respiratory: CTA bilaterally, no wheezing, no rhonchi Abdominal: Soft, NT, ND, bowel sounds + Extremities: no edema, no cyanosis   The results of significant diagnostics from this hospitalization (including imaging, microbiology, ancillary and laboratory) are listed below for reference.    Significant Diagnostic Studies: Dg Chest 2 View  Result Date: 03/20/2017 CLINICAL DATA:  Syncopal episode, striking head today. EXAM: CHEST  2 VIEW COMPARISON:  11/15/2013 FINDINGS: Heart size at the upper limits of normal.  Aortic atherosclerosis. The pulmonary vascularity is normal. The lungs are clear. No effusions. No acute bone finding. IMPRESSION: No active disease.  Aortic atherosclerosis. Electronically Signed   By: [name] Chimes M.D.   On: 03/20/2017 19:16   Ct Head Wo Contrast  Result Date: 03/20/2017 CLINICAL DATA:  76 year old male with history of syncopal event injuring head on metal pole. Laceration to the back of the head. EXAM: CT HEAD WITHOUT CONTRAST CT CERVICAL SPINE WITHOUT CONTRAST TECHNIQUE: Multidetector CT imaging of the head and cervical spine was performed following the standard protocol without intravenous contrast. Multiplanar CT image reconstructions of the cervical spine were also generated. COMPARISON:  Head CT 04/29/2011. FINDINGS: CT HEAD FINDINGS Brain: Patchy and confluent areas of decreased attenuation are noted throughout the deep and periventricular white matter of the cerebral hemispheres bilaterally, compatible with chronic microvascular ischemic disease. Physiologic calcifications in the left basal ganglia. No evidence of acute infarction, hemorrhage, hydrocephalus, extra-axial collection or mass lesion/mass effect. Vascular: No hyperdense vessel or unexpected calcification. Skull: Normal. Negative for fracture or focal lesion. Sinuses/Orbits: No acute finding. Other: Small amount of soft tissue swelling in the right frontal scalp, presumably a hematoma. Bandage overlying the low left parietal scalp where there is also some mild soft tissue swelling. CT CERVICAL SPINE FINDINGS Alignment: Normal. Skull base and vertebrae: No acute fracture. No primary bone lesion or focal pathologic process. Soft tissues and spinal canal: No prevertebral fluid or swelling. No visible canal hematoma. Disc levels: Mild multilevel degenerative disc disease, most apparent at C6-C7. Mild multilevel facet arthropathy. Upper chest: Unremarkable. Other: None. IMPRESSION: 1. Small scalp contusion in the left parietal  region and small right frontal scalp hematoma. No underlying displaced skull fracture, evidence of significant acute traumatic injury to the brain, or acute abnormality of the cervical spine. 2. Mild chronic microvascular ischemic changes in the cerebral white matter again noted. 3. Mild multilevel degenerative disc disease and cervical spondylosis. Electronically Signed   By: Vinnie Langton M.D.   On: 03/20/2017 19:26   Ct Cervical Spine Wo Contrast  Result Date: 03/20/2017 CLINICAL DATA:  76 year old male with history of syncopal event injuring head on metal pole. Laceration to the back of the head. EXAM: CT HEAD WITHOUT CONTRAST CT CERVICAL SPINE WITHOUT CONTRAST TECHNIQUE: Multidetector CT imaging of the head and cervical spine was performed following the standard protocol without intravenous contrast. Multiplanar CT image reconstructions of the cervical spine were also generated. COMPARISON:  Head CT 04/29/2011. FINDINGS: CT HEAD FINDINGS Brain: Patchy and confluent areas of decreased attenuation are noted throughout the deep and periventricular white matter of the cerebral hemispheres bilaterally, compatible with chronic microvascular ischemic disease. Physiologic calcifications in the left basal ganglia. No evidence of acute infarction, hemorrhage, hydrocephalus, extra-axial collection or mass lesion/mass effect. Vascular: No hyperdense vessel or unexpected calcification. Skull: Normal. Negative for fracture or focal lesion. Sinuses/Orbits: No acute finding. Other: Small amount of soft tissue swelling in the right frontal scalp, presumably a hematoma. Bandage  overlying the low left parietal scalp where there is also some mild soft tissue swelling. CT CERVICAL SPINE FINDINGS Alignment: Normal. Skull base and vertebrae: No acute fracture. No primary bone lesion or focal pathologic process. Soft tissues and spinal canal: No prevertebral fluid or swelling. No visible canal hematoma. Disc levels: Mild  multilevel degenerative disc disease, most apparent at C6-C7. Mild multilevel facet arthropathy. Upper chest: Unremarkable. Other: None. IMPRESSION: 1. Small scalp contusion in the left parietal region and small right frontal scalp hematoma. No underlying displaced skull fracture, evidence of significant acute traumatic injury to the brain, or acute abnormality of the cervical spine. 2. Mild chronic microvascular ischemic changes in the cerebral white matter again noted. 3. Mild multilevel degenerative disc disease and cervical spondylosis. Electronically Signed   By: Vinnie Langton M.D.   On: 03/20/2017 19:26     Microbiology: No results found for this or any previous visit (from the past 240 hour(s)).   Labs: Basic Metabolic Panel:  Recent Labs Lab 03/20/17 1834 03/22/17 0642  NA 140 146*  K 4.2 3.9  CL 107 114*  CO2 24 28  GLUCOSE 119* 93  BUN 20 12  CREATININE 1.22 1.09  CALCIUM 9.2 8.9  MG  --  1.9   Liver Function Tests: No results for input(s): AST, ALT, ALKPHOS, BILITOT, PROT, ALBUMIN in the last 168 hours. No results for input(s): LIPASE, AMYLASE in the last 168 hours. No results for input(s): AMMONIA in the last 168 hours. CBC:  Recent Labs Lab 03/20/17 1834  WBC 7.3  NEUTROABS 4.2  HGB 15.0  HCT 43.5  MCV 91.4  PLT 183   Cardiac Enzymes:  Recent Labs Lab 03/20/17 1834 03/20/17 1840  CKTOTAL  --  39  TROPONINI <0.03  --    BNP: Invalid input(s): POCBNP CBG:  Recent Labs Lab 03/20/17 1840  GLUCAP 113*    Time coordinating discharge:  Greater than 30 minutes  Signed:  Kesha Hurrell, DO [facility] Pager: [phone] 03/22/2017, 9:24 AM

## 2017-03-26 ENCOUNTER — Encounter: Payer: Self-pay | Admitting: Nurse Practitioner

## 2017-03-27 ENCOUNTER — Encounter (INDEPENDENT_AMBULATORY_CARE_PROVIDER_SITE_OTHER): Payer: Medicare Other

## 2017-03-27 DIAGNOSIS — R55 Syncope and collapse: Secondary | ICD-10-CM

## 2017-03-31 ENCOUNTER — Ambulatory Visit (INDEPENDENT_AMBULATORY_CARE_PROVIDER_SITE_OTHER): Payer: Medicare Other | Admitting: Adult Health

## 2017-03-31 ENCOUNTER — Encounter: Payer: Self-pay | Admitting: Adult Health

## 2017-03-31 VITALS — BP 136/80 | HR 54 | Ht 70.5 in | Wt 199.0 lb

## 2017-03-31 DIAGNOSIS — R55 Syncope and collapse: Secondary | ICD-10-CM | POA: Diagnosis not present

## 2017-03-31 DIAGNOSIS — E063 Autoimmune thyroiditis: Secondary | ICD-10-CM | POA: Diagnosis not present

## 2017-03-31 DIAGNOSIS — I4891 Unspecified atrial fibrillation: Secondary | ICD-10-CM | POA: Diagnosis not present

## 2017-03-31 DIAGNOSIS — E860 Dehydration: Secondary | ICD-10-CM | POA: Diagnosis not present

## 2017-03-31 DIAGNOSIS — Z0001 Encounter for general adult medical examination with abnormal findings: Secondary | ICD-10-CM | POA: Diagnosis not present

## 2017-03-31 DIAGNOSIS — I10 Essential (primary) hypertension: Secondary | ICD-10-CM

## 2017-03-31 NOTE — Progress Notes (Signed)
Cardiology Office Note   Date:  03/31/2017   ID:  Brent Wilkins, Brent Wilkins [birth year], MRN [id number]  PCP:  Redmond School, MD  Cardiologist:  Harrington Challenger  Chief Complaint  Patient presents with  . Atrial Fibrillation  . Loss of Consciousness      History of Present Illness: Brent Wilkins is a 76 y.o. male who presents for ongoing assessment and management of paroxysmal atrial fibrillation on ELIQUIS, hyperlipidemia, we are seeing posthospitalization after admission for syncope and collapse he was found to be bradycardic during hospitalization and propanolol and amlodipine were held and ultimately DC'd. He was scheduled for 30 day event monitor at the time of discharge. He was also found to be positive for orthostatic hypotension. The patient was advised not to drive until  etiology of his episodes were determined. Cardiac monitor as she had to be placed.  While in church this past Sunday when it was very hot and humid and he began to feel lightheaded and dizzy. They left the building and when he arrived at his car and use air conditioning everything got better.  He was seen by his primary care physician today and had staples removed from his scalp. He was found to have an infection which was cleaned and he was started on antibiotics. He is being referred to a Psychologist, sport and exercise. He remains on anticoagulation therapy.  Past Medical History:  Diagnosis Date  . Arthritis   . Dysphagia   . Frequent urination at night   . GERD (gastroesophageal reflux disease)   . History of kidney stones   . HTN (hypertension)   . Hypothyroidism   . 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 Neuropathy    Bilateral ankles  . Prostate cancer [facility]) 2010   Dr. Rosana Hoes  . Seasonal allergies   . Syncope  dx. 8 yrs ago    Past Surgical History:  Procedure Laterality Date  . APPENDECTOMY    . CATARACT EXTRACTION W/PHACO Left 03/27/2015   Procedure: CATARACT EXTRACTION PHACO AND INTRAOCULAR LENS PLACEMENT LEFT EYE CDE=11.73;   Surgeon: Tonny Branch, MD;  [facility]: [facility];  Service: Ophthalmology;  Laterality: Left;  . CATARACT EXTRACTION W/PHACO Right 03/30/2015   Procedure: CATARACT EXTRACTION PHACO AND INTRAOCULAR LENS PLACEMENT RIGHT EYE CDE=7.35;  Surgeon: Tonny Branch, MD;  Location: [facility];  Service: Ophthalmology;  Laterality: Right;  . CHOLECYSTECTOMY    . COLONOSCOPY  07/23/10   Dr. Vivi Ferns rectum, scattered pancolonic diverticula  . COLONOSCOPY  08/10/1999   internal hemorrhoids,inflammatory polyp  . ESOPHAGOGASTRODUODENOSCOPY  04/12/08   Prominent Schatzki's ring, erosive reflux esophagitis, multiple antral erosions, small hiatal hernia, reactive gastropathy, status post dilation with 43F  . ESOPHAGOGASTRODUODENOSCOPY  10/28/2011   Procedure: ESOPHAGOGASTRODUODENOSCOPY (EGD);  Surgeon: Daneil Dolin, MD;  [facility];  Service: Endoscopy;  Laterality: N/A;  1:30  . MALONEY DILATION  10/28/2011   Procedure: Venia Minks DILATION;  Surgeon: Daneil Dolin, MD;  [facility];  Service: Endoscopy;  Laterality: N/A;  . SAVORY DILATION  10/28/2011   Procedure: SAVORY DILATION;  Surgeon: Daneil Dolin, MD;  [facility];  Service: Endoscopy;  Laterality: N/A;  . TONSILLECTOMY    . TOTAL ANKLE ARTHROPLASTY Left 11/25/2013   Procedure: TOTAL ANKLE ARTHOPLASTY;  Surgeon: Wylene Simmer, MD;  Location[place] [facility]  Service: Orthopedics;  Laterality: Left;  . TOTAL ANKLE ARTHROPLASTY Right 07/14/2014   dr hewitt  . TOTAL ANKLE ARTHROPLASTY Right 07/14/2014   Procedure: RIGHT  TOTAL ANKLE ARTHOPLASTY;  Surgeon: Wylene Simmer, MD;  Location: [place];  Service: Orthopedics;  Laterality: Right;     Current Outpatient Prescriptions  Medication Sig Dispense Refill  . ELIQUIS 5 MG TABS tablet TAKE 1 TABLET BY MOUTH TWO  TIMES DAILY 180 tablet 3  . finasteride (PROSCAR) 5 MG tablet Take 5 mg by mouth daily.    . fluticasone (FLONASE) 50 MCG/ACT nasal spray Place 1 spray into both nostrils daily as needed  for allergies.     Marland Kitchen gabapentin (NEURONTIN) 300 MG capsule Take 300 mg by mouth 2 (two) times daily.     Marland Kitchen levothyroxine (SYNTHROID, LEVOTHROID) 75 MCG tablet Take 75 mcg by mouth daily.  0  . losartan (COZAAR) 25 MG tablet Take 1 tablet (25 mg total) by mouth daily. 30 tablet 0  . Omega-3 Fatty Acids (RA FISH OIL) 1400 MG CPDR Take 1,400 mg by mouth daily.    Vladimir Faster Glycol-Propyl Glycol (SYSTANE) 0.4-0.3 % SOLN Place 1-2 drops into both eyes 2 (two) times daily as needed.    . Pseudoephedrine-Acetaminophen (SINUS HEADACHE/NON-DROWSY PO) Take 2 tablets by mouth daily as needed.    . Tamsulosin HCl (FLOMAX) 0.4 MG CAPS Take 0.4 mg by mouth daily after breakfast.     . vitamin B-12 (CYANOCOBALAMIN) 500 MCG tablet Take 1,000 mcg by mouth daily.      No current facility-administered medications for this visit.     Allergies:   Oxycodone; Statins; Tetanus toxoids; Doxycycline; Penicillins; and Sulfa antibiotics    Social History:  The patient  reports that he has never smoked. He has never used smokeless tobacco. He reports that he drinks about 0.6 - 1.2 oz of alcohol per week . He reports that he does not use drugs.   Family History:  The patient's family history includes Prostate cancer in his brother and father; Prostate cancer (age of onset: 59) in his son.    ROS: All other systems are reviewed and negative. Unless otherwise mentioned in H&P    PHYSICAL EXAM: VS:  BP 136/80 (BP Location: Left Arm)   Pulse (!) 54   Ht 5' 10.5" (1.791 m)   Wt 199 lb (90.3 kg)   SpO2 97%   BMI 28.15 kg/m  , BMI Body mass index is 28.15 kg/m. GEN: Well nourished, well developed, in no acute distress  HEENT: Wrapped dressing around his scalp, with padding on the occipital area. Some staining is noted.  Neck: no JVD, carotid bruits, or masses Cardiac: RRR; frequent extra systole. no murmurs, rubs, or gallops,no edema  Respiratory:  clear to auscultation bilaterally, normal work of breathing GI:  soft, nontender, nondistended, + BS MS: no deformity or atrophy  Skin: warm and dry, no rash Neuro:  Strength and sensation are intact Psych: euthymic mood, full affect   Recent Labs: 11/14/2016: ALT 7 03/20/2017: Hemoglobin 15.0; Platelets 183; TSH 2.125 03/22/2017: BUN 12; Creatinine, Ser 1.09; Magnesium 1.9; Potassium 3.9; Sodium 146    Lipid Panel    Component Value Date/Time   CHOL 141 11/14/2016 0928   TRIG 85 11/14/2016 0928   HDL 50 11/14/2016 0928   CHOLHDL 2.8 11/14/2016 0928   VLDL 17 11/14/2016 0928   LDLCALC 74 11/14/2016 0928      Wt Readings from Last 3 Encounters:  03/31/17 199 lb (90.3 kg)  19-Apr-2017 193 lb (87.5 kg)  11/14/16 197 lb (89.4 kg)      Other studies Reviewed: Echocardiogram April 19, 2017 Left ventricle: The cavity size  was normal. Wall thickness was   increased in a pattern of moderate LVH. Systolic function was   normal. The estimated ejection fraction was in the range of 60%   to 65%. Wall motion was normal; there were no regional wall   motion abnormalities. Doppler parameters are consistent with   restrictive physiology, indicative of decreased left ventricular   diastolic compliance and/or increased left atrial pressure.   Doppler parameters are consistent with high ventricular filling   pressure. - Aortic valve: Moderately calcified annulus. Trileaflet. - Mitral valve: Mildly calcified annulus. - Left atrium: The atrium was severely dilated. - Right ventricle: Systolic function was mildly reduced. - Right atrium: The atrium was mildly to moderately dilated. - Tricuspid valve: There was mild-moderate regurgitation. - Pulmonary arteries: Incomplete TR jet to accurately estimated   PASP.  ASSESSMENT AND PLAN:  1.  PAF: Remains on Eliquis. No AV nodal blockers in the setting of bradycardia and syncope. Continue to wear cardiac monitor. He has some tremors off of the propanolol.   2. Hypertension: Currently controlled. No changes at this  time.   3. Syncope: He has not had any further episodes. Some dizziness while in a hot church this Sunday. Got better when he got into air conditioning. He wants to travel to see a football game in [place]. He is not driving. He can do that but must be in cool weather, out of the sun. He agrees to this. He is not to drive until further notice.   Current medicines are reviewed at length with the patient today.    Labs/ tests ordered today include:  Phill Myron. West Pugh, ANP, AACC   03/31/2017 4:56 PM    [facility] [address], [place], [place] [address] Phone: [phone]; Fax: [phone]

## 2017-03-31 NOTE — Patient Instructions (Signed)
Medication Instructions:  Your physician recommends that you continue on your current medications as directed. Please refer to the Current Medication list given to you today.   Labwork: NONE  Testing/Procedures: NONE  Follow-Up: Your physician recommends that you schedule a follow-up appointment in: 3-4 Weeks   Any Other Special Instructions Will Be Listed Below (If Applicable).     If you need a refill on your cardiac medications before your next appointment, please call your pharmacy.

## 2017-04-28 ENCOUNTER — Encounter: Payer: Self-pay | Admitting: Adult Health

## 2017-04-28 ENCOUNTER — Ambulatory Visit (INDEPENDENT_AMBULATORY_CARE_PROVIDER_SITE_OTHER): Payer: Medicare Other | Admitting: Adult Health

## 2017-04-28 VITALS — BP 134/84 | HR 56 | Ht 70.5 in | Wt 195.0 lb

## 2017-04-28 DIAGNOSIS — I4821 Permanent atrial fibrillation: Secondary | ICD-10-CM

## 2017-04-28 DIAGNOSIS — I482 Chronic atrial fibrillation: Secondary | ICD-10-CM | POA: Diagnosis not present

## 2017-04-28 DIAGNOSIS — R55 Syncope and collapse: Secondary | ICD-10-CM | POA: Diagnosis not present

## 2017-04-28 DIAGNOSIS — I10 Essential (primary) hypertension: Secondary | ICD-10-CM

## 2017-04-28 MED ORDER — LOSARTAN POTASSIUM 25 MG PO TABS: 25.0000 mg | ORAL_TABLET | Freq: Every day | ORAL | 3 refills | Status: DC

## 2017-04-28 NOTE — Progress Notes (Signed)
Cardiology Office Note   Date:  04/28/2017   ID:  Brent, Wilkins Feb 06, 1941, MRN [id number]  PCP:  Redmond School, MD  Cardiologist: Wyatt Haste, M.D.  Chief Complaint  Patient presents with  . Atrial Fibrillation  . Hyperlipidemia     History of Present Illness: Brent Wilkins is a 76 y.o. male who presents for ongoing assessment and management of paroxysmal atrial fibrillation, on anticoagulation with ELIQUIS, hyperlipidemia. The patient was last seen in the office on 03/31/2017, post hospitalization for syncope and collapse. The patient was not placed on any AV nodal blockers in the setting of bradycardia and syncope noted during recent hospitalization. Cardiac monitor was placed.  Narrative & Impression     Atrial fibrillation and flutter seen throughout study.  Multiple pauses up to 3 seconds with most occurring during early morning hours (presumably asleep) and one occurring at approximately 1 pm.   He comes today having had dizzy spells since being seen last. He states he thought it was related to working outside in the heat. He has not had any more syncopal episodes. He plans to get on a plane and fly to [place] to watch a [employer] football game with his family. He is anxious to go to that game.   Past Medical History:  Diagnosis Date  . Arthritis   . Dysphagia   . Frequent urination at night   . GERD (gastroesophageal reflux disease)   . History of kidney stones   . HTN (hypertension)   . Hypothyroidism   . 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 Neuropathy    Bilateral ankles  . Prostate cancer [facility]) 2010   Dr. Rosana Hoes  . Seasonal allergies   . Syncope  dx. 8 yrs ago    Past Surgical History:  Procedure Laterality Date  . APPENDECTOMY    . CATARACT EXTRACTION W/PHACO Left 03/27/2015   Procedure: CATARACT EXTRACTION PHACO AND INTRAOCULAR LENS PLACEMENT LEFT EYE CDE=11.73;  Surgeon: Tonny Branch, MD;  [facility];  Service: Ophthalmology;  Laterality:  Left;  . CATARACT EXTRACTION W/PHACO Right 03/30/2015   Procedure: CATARACT EXTRACTION PHACO AND INTRAOCULAR LENS PLACEMENT RIGHT EYE CDE=7.35;  Surgeon: Tonny Branch, MD;  [facility]: [facility];  Service: Ophthalmology;  Laterality: Right;  . CHOLECYSTECTOMY    . COLONOSCOPY  07/23/10   Dr. Vivi Ferns rectum, scattered pancolonic diverticula  . COLONOSCOPY  08/10/1999   internal hemorrhoids,inflammatory polyp  . ESOPHAGOGASTRODUODENOSCOPY  04/12/08   Prominent Schatzki's ring, erosive reflux esophagitis, multiple antral erosions, small hiatal hernia, reactive gastropathy, status post dilation with 7F  . ESOPHAGOGASTRODUODENOSCOPY  10/28/2011   Procedure: ESOPHAGOGASTRODUODENOSCOPY (EGD);  Surgeon: Daneil Dolin, MD;  [facility];  Service: Endoscopy;  Laterality: N/A;  1:30  . MALONEY DILATION  10/28/2011   Procedure: Venia Minks DILATION;  Surgeon: Daneil Dolin, MD;  [facility];  Service: Endoscopy;  Laterality: N/A;  . SAVORY DILATION  10/28/2011   Procedure: SAVORY DILATION;  Surgeon: Daneil Dolin, MD;  [facility];  Service: Endoscopy;  Laterality: N/A;  . TONSILLECTOMY    . TOTAL ANKLE ARTHROPLASTY Left 11/25/2013   Procedure: TOTAL ANKLE ARTHOPLASTY;  Surgeon: Wylene Simmer, MD;  Location: [place];  Service: Orthopedics;  Laterality: Left;  . TOTAL ANKLE ARTHROPLASTY Right 07/14/2014   dr hewitt  . TOTAL ANKLE ARTHROPLASTY Right 07/14/2014   Procedure: RIGHT TOTAL ANKLE ARTHOPLASTY;  Surgeon: Wylene Simmer, MD;  Location: [place];  Service: Orthopedics;  Laterality: Right;     Current Outpatient Prescriptions  Medication Sig Dispense Refill  . ELIQUIS 5 MG TABS tablet TAKE 1 TABLET BY MOUTH TWO  TIMES DAILY 180 tablet 3  . finasteride (PROSCAR) 5 MG tablet Take 5 mg by mouth daily.    . fluticasone (FLONASE) 50 MCG/ACT nasal spray Place 1 spray into both nostrils daily as needed for allergies.     Marland Kitchen gabapentin (NEURONTIN) 300 MG capsule Take 300 mg by mouth 2  (two) times daily.     Marland Kitchen levothyroxine (SYNTHROID, LEVOTHROID) 75 MCG tablet Take 75 mcg by mouth daily.  0  . losartan (COZAAR) 25 MG tablet Take 1 tablet (25 mg total) by mouth daily. 90 tablet 3  . Omega-3 Fatty Acids (RA FISH OIL) 1400 MG CPDR Take 1,400 mg by mouth daily.    Vladimir Faster Glycol-Propyl Glycol (SYSTANE) 0.4-0.3 % SOLN Place 1-2 drops into both eyes 2 (two) times daily as needed.    . Pseudoephedrine-Acetaminophen (SINUS HEADACHE/NON-DROWSY PO) Take 2 tablets by mouth daily as needed.    . Tamsulosin HCl (FLOMAX) 0.4 MG CAPS Take 0.4 mg by mouth daily after breakfast.     . vitamin B-12 (CYANOCOBALAMIN) 500 MCG tablet Take 1,000 mcg by mouth daily.      No current facility-administered medications for this visit.     Allergies:   Oxycodone; Statins; Tetanus toxoids; Doxycycline; Penicillins; and Sulfa antibiotics    Social History:  The patient  reports that he has never smoked. He has never used smokeless tobacco. He reports that he drinks about 0.6 - 1.2 oz of alcohol per week . He reports that he does not use drugs.   Family History:  The patient's family history includes Prostate cancer in his brother and father; Prostate cancer (age of onset: 51) in his son.    ROS: All other systems are reviewed and negative. Unless otherwise mentioned in H&P    PHYSICAL EXAM: VS:  BP 134/84   Pulse 98   Ht 5' 10.5" (1.791 m)   Wt 195 lb (88.5 kg)   SpO2 (!) 56%   BMI 27.58 kg/m  , BMI Body mass index is 27.58 kg/m. GEN: Well nourished, well developed, in no acute distress  HEENT: normal  Neck: no JVD, carotid bruits, or masses Cardiac: IRRR; bradycardic, no murmurs, rubs, or gallops,no edema  Respiratory:  clear to auscultation bilaterally, normal work of breathing GI: soft, nontender, nondistended, + BS MS: no deformity or atrophy  Skin: warm and dry, no rash Neuro:  Strength and sensation are intact Psych: euthymic mood, full affect  Recent Labs: 11/14/2016: ALT  7 03/20/2017: Hemoglobin 15.0; Platelets 183; TSH 2.125 03/22/2017: BUN 12; Creatinine, Ser 1.09; Magnesium 1.9; Potassium 3.9; Sodium 146    Lipid Panel    Component Value Date/Time   CHOL 141 11/14/2016 0928   TRIG 85 11/14/2016 0928   HDL 50 11/14/2016 0928   CHOLHDL 2.8 11/14/2016 0928   VLDL 17 11/14/2016 0928   LDLCALC 74 11/14/2016 0928      Wt Readings from Last 3 Encounters:  04/28/17 195 lb (88.5 kg)  03/31/17 199 lb (90.3 kg)  03/21/17 193 lb (87.5 kg)      Other studies Reviewed:  Echocardiogram 03/21/2017 Left ventricle: The cavity size was normal. Wall thickness was   increased in a pattern of moderate LVH. Systolic function was   normal. The estimated ejection fraction was in the range of 60%   to 65%. Wall motion  was normal; there were no regional wall   motion abnormalities. Doppler parameters are consistent with   restrictive physiology, indicative of decreased left ventricular   diastolic compliance and/or increased left atrial pressure.   Doppler parameters are consistent with high ventricular filling   pressure. - Aortic valve: Moderately calcified annulus. Trileaflet. - Mitral valve: Mildly calcified annulus. - Left atrium: The atrium was severely dilated. - Right ventricle: Systolic function was mildly reduced. - Right atrium: The atrium was mildly to moderately dilated. - Tricuspid valve: There was mild-moderate regurgitation. - Pulmonary arteries: Incomplete TR jet to accurately estimated   PASP.  ASSESSMENT AND PLAN:  1. Syncope and collapse: Cardiac monitor revealed atrial fibrillation and atrial flutter with variable conduction, having 3-3.5 second pauses early morning hours around 4 AM and 5 AM, and also in the afternoon around 12:30 and 1 PM. The patient has been having some dizzy spells which he felt were related to working on size in the heat.   I have discussed with the patient the need to be seen by electrophysiology for discussion of  pacemaker implantation. He was reluctant at first to be seen by EP this week as he had made plans to one out of town to the full pole came. After reviewing the cardiac strips with the patient and showing him the pauses and abnormalities seen, he has agreed to be seen.  I've arranged at appointment with Dr. Lavell Luster the [facility] office on September 25 at 11 AM. I have spoken with Dr. Curt Bears Concerning need to hold [facility]. He advised that it was not necessary. I discussed with the patient who is willing to be seen, and have pacemaker implantation. We'll defer to them for scheduling. He is again advised not to drive.  2. Hypertension: He will continue on losartan 25 mg daily. Refills are provided.  3. Atrial fib/Atrial flutter with variable conduction:  He is not on any AV nodal blocking agents. He will be continued on ELIQUIS with CHADS VASC score of 2   Cuent medicines are reviewed at length with the patient today.    Labs/ tests ordered today include: We'll defer to EP for timing of labs prior to pacemaker placement if indicated.  Phill Myron. West Pugh, ANP, AACC   04/28/2017 3:13 PM    [facility] [address], [place], [place] [address] Phone: [phone]; Fax: [phone]

## 2017-04-28 NOTE — Patient Instructions (Signed)
Medication Instructions:  Your physician recommends that you continue on your current medications as directed. Please refer to the Current Medication list given to you today.   Labwork: NONE   Testing/Procedures: NONE   Follow-Up: Your physician recommends that you schedule a follow-up appointment with Electrophysiology.   Any Other Special Instructions Will Be Listed Below (If Applicable).     If you need a refill on your cardiac medications before your next appointment, please call your pharmacy. Thank you for choosing [facility]!

## 2017-04-29 ENCOUNTER — Ambulatory Visit: Payer: Medicare Other | Admitting: Nurse Practitioner

## 2017-04-30 ENCOUNTER — Telehealth: Payer: Self-pay | Admitting: Pharmacist

## 2017-04-30 ENCOUNTER — Ambulatory Visit (INDEPENDENT_AMBULATORY_CARE_PROVIDER_SITE_OTHER): Payer: Medicare Other | Admitting: Cardiology

## 2017-04-30 ENCOUNTER — Encounter: Payer: Self-pay | Admitting: Cardiology

## 2017-04-30 ENCOUNTER — Ambulatory Visit: Payer: Medicare Other | Admitting: Nurse Practitioner

## 2017-04-30 VITALS — BP 136/72 | HR 57 | Ht 71.0 in | Wt 193.2 lb

## 2017-04-30 DIAGNOSIS — R55 Syncope and collapse: Secondary | ICD-10-CM | POA: Diagnosis not present

## 2017-04-30 DIAGNOSIS — I481 Persistent atrial fibrillation: Secondary | ICD-10-CM

## 2017-04-30 DIAGNOSIS — I4819 Other persistent atrial fibrillation: Secondary | ICD-10-CM

## 2017-04-30 DIAGNOSIS — I10 Essential (primary) hypertension: Secondary | ICD-10-CM

## 2017-04-30 NOTE — Telephone Encounter (Signed)
Medication list reviewed in anticipation of upcoming Tikosyn initiation. Patient is not taking any contraindicated or QTc prolonging medications. Patient is anticoagulated on Eliquis 5mg  BID on the appropriate dose. K slightly low on last check at 3.9 and may need to be supplemented, Mg at goal.  Please ensure that patient has not missed any anticoagulation doses in the 3 weeks prior to Tikosyn initiation. Patient will need to be counseled to avoid use of Benadryl while on Tikosyn and in the 2-3 days prior to Tikosyn initiation.

## 2017-04-30 NOTE — Progress Notes (Signed)
Electrophysiology Office Note   Date:  04/30/2017   ID:  Brent Wilkins, Brent Wilkins [birth year], MRN [id number]  PCP:  Redmond School, MD  Cardiologist:  Harrington Challenger Primary Electrophysiologist:  Teddie Curd [name], MD    Chief Complaint  Patient presents with  . Advice Only    AFib     History of Present Illness: Brent Wilkins is a 76 y.o. male who is being seen today for the evaluation of atrial fibrillation at the request of Redmond School, MD. Presenting today for electrophysiology evaluation. He has a history of hypertension, paroxysmal atrial fibrillation, and hyperlipidemia. He was seen in the hospital total at the beginning of April for syncope. The patient was bradycardic and thus not on any AV nodal blockers. At the time of hospitalization, he was found to be orthostatic.    Today, he denies symptoms of palpitations, chest pain, shortness of breath, orthopnea, PND, lower extremity edema, claudication, presyncope, syncope, bleeding, or neurologic sequela. The patient is tolerating medications without difficulties. Main complaint today is of weakness and fatigue. He has not had any episodes of syncope since his hospitalization. He wore cardiac monitor that showed atrial fibrillation and nocturnal pauses of up to 3-1/2 seconds. His heart rate ranged from 44-123 bpm.   Past Medical History:  Diagnosis Date  . Arthritis   . Dysphagia   . Frequent urination at night   . GERD (gastroesophageal reflux disease)   . History of kidney stones   . HTN (hypertension)   . Hypothyroidism   . 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 Neuropathy    Bilateral ankles  . Prostate cancer [facility]) 2010   Dr. Rosana Hoes  . Seasonal allergies   . Syncope  dx. 8 yrs ago   Past Surgical History:  Procedure Laterality Date  . APPENDECTOMY    . CATARACT EXTRACTION W/PHACO Left 03/27/2015   Procedure: CATARACT EXTRACTION PHACO AND INTRAOCULAR LENS PLACEMENT LEFT EYE CDE=11.73;  Surgeon: Tonny Branch, MD;   Location: [facility];  Service: Ophthalmology;  Laterality: Left;  . CATARACT EXTRACTION W/PHACO Right 03/30/2015   Procedure: CATARACT EXTRACTION PHACO AND INTRAOCULAR LENS PLACEMENT RIGHT EYE CDE=7.35;  Surgeon: Tonny Branch, MD;  Location: [facility];  Service: Ophthalmology;  Laterality: Right;  . CHOLECYSTECTOMY    . COLONOSCOPY  07/23/10   Dr. Vivi Ferns rectum, scattered pancolonic diverticula  . COLONOSCOPY  08/10/1999   internal hemorrhoids,inflammatory polyp  . ESOPHAGOGASTRODUODENOSCOPY  04/12/08   Prominent Schatzki's ring, erosive reflux esophagitis, multiple antral erosions, small hiatal hernia, reactive gastropathy, status post dilation with 6F  . ESOPHAGOGASTRODUODENOSCOPY  10/28/2011   Procedure: ESOPHAGOGASTRODUODENOSCOPY (EGD);  Surgeon: Daneil Dolin, MD;  [facility];  Service: Endoscopy;  Laterality: N/A;  1:30  . MALONEY DILATION  10/28/2011   Procedure: Venia Minks DILATION;  Surgeon: Daneil Dolin, MD;  [facility];  Service: Endoscopy;  Laterality: N/A;  . SAVORY DILATION  10/28/2011   Procedure: SAVORY DILATION;  Surgeon: Daneil Dolin, MD;  [facility];  Service: Endoscopy;  Laterality: N/A;  . TONSILLECTOMY    . TOTAL ANKLE ARTHROPLASTY Left 11/25/2013   Procedure: TOTAL ANKLE ARTHOPLASTY;  Surgeon: Wylene Simmer, MD;  Location: [place];  Service: Orthopedics;  Laterality: Left;  . TOTAL ANKLE ARTHROPLASTY Right 07/14/2014   dr hewitt  . TOTAL ANKLE ARTHROPLASTY Right 07/14/2014   Procedure: RIGHT TOTAL ANKLE ARTHOPLASTY;  Surgeon: Wylene Simmer, MD;  Location: [place];  Service: Orthopedics;  Laterality: Right;  Current Outpatient Prescriptions  Medication Sig Dispense Refill  . ELIQUIS 5 MG TABS tablet TAKE 1 TABLET BY MOUTH TWO  TIMES DAILY 180 tablet 3  . finasteride (PROSCAR) 5 MG tablet Take 5 mg by mouth daily.    . fluticasone (FLONASE) 50 MCG/ACT nasal spray Place 1 spray into both nostrils daily as needed for allergies.     Marland Kitchen  gabapentin (NEURONTIN) 300 MG capsule Take 300 mg by mouth 2 (two) times daily.     Marland Kitchen levothyroxine (SYNTHROID, LEVOTHROID) 75 MCG tablet Take 75 mcg by mouth daily.  0  . losartan (COZAAR) 25 MG tablet Take 1 tablet (25 mg total) by mouth daily. 90 tablet 3  . Omega-3 Fatty Acids (RA FISH OIL) 1400 MG CPDR Take 1,400 mg by mouth daily.    Brent Wilkins (SYSTANE) 0.4-0.3 % SOLN Place 1-2 drops into both eyes 2 (two) times daily as needed.    . Pseudoephedrine-Acetaminophen (SINUS HEADACHE/NON-DROWSY PO) Take 2 tablets by mouth daily as needed.    . Tamsulosin HCl (FLOMAX) 0.4 MG CAPS Take 0.4 mg by mouth daily after breakfast.     . vitamin B-12 (CYANOCOBALAMIN) 500 MCG tablet Take 1,000 mcg by mouth daily.      No current facility-administered medications for this visit.     Allergies:   Oxycodone; Statins; Tetanus toxoids; Doxycycline; Penicillins; and Sulfa antibiotics   Social History:  The patient  reports that he has never smoked. He has never used smokeless tobacco. He reports that he drinks about 0.6 - 1.2 oz of alcohol per week . He reports that he does not use drugs.   Family History:  The patient's family history includes Prostate cancer in his brother and father; Prostate cancer (age of onset: 64) in his son.    ROS:  Please see the history of present illness.   Otherwise, review of systems is positive for palpitations, balance problems, dizziness, syncope, headaches, easy bruising.   All other systems are reviewed and negative.    PHYSICAL EXAM: VS:  BP 136/72   Pulse (!) 57   Ht 5\' 11"  (1.803 m)   Wt 193 lb 3.2 oz (87.6 kg)   BMI 26.95 kg/m  , BMI Body mass index is 26.95 kg/m. GEN: Well nourished, well developed, in no acute distress  HEENT: normal  Neck: no JVD, carotid bruits, or masses Cardiac: iRRR; no murmurs, rubs, or gallops,no edema  Respiratory:  clear to auscultation bilaterally, normal work of breathing GI: soft, nontender, nondistended,  + BS MS: no deformity or atrophy  Skin: warm and dry Neuro:  Strength and sensation are intact Psych: euthymic mood, full affect  EKG:  EKG is ordered today. Personal review of the ekg ordered shows AF, RBBB, rate 57  Recent Labs: 11/14/2016: ALT 7 03/20/2017: Hemoglobin 15.0; Platelets 183; TSH 2.125 03/22/2017: BUN 12; Creatinine, Ser 1.09; Magnesium 1.9; Potassium 3.9; Sodium 146    Lipid Panel     Component Value Date/Time   CHOL 141 11/14/2016 0928   TRIG 85 11/14/2016 0928   HDL 50 11/14/2016 0928   CHOLHDL 2.8 11/14/2016 0928   VLDL 17 11/14/2016 0928   LDLCALC 74 11/14/2016 0928     Wt Readings from Last 3 Encounters:  04/30/17 193 lb 3.2 oz (87.6 kg)  04/28/17 195 lb (88.5 kg)  03/31/17 199 lb (90.3 kg)      Other studies Reviewed: Additional studies/ records that were reviewed today include: 30 day monitor 04/28/17 - personally  reviewed  Review of the above records today demonstrates:   Atrial fibrillation and flutter seen throughout study.  Multiple pauses up to 3 seconds with most occurring during early morning hours (presumably asleep) and one occurring at approximately 1 pm.  TTE 03/21/17 - Left ventricle: The cavity size was normal. Wall thickness was   increased in a pattern of moderate LVH. Systolic function was   normal. The estimated ejection fraction was in the range of 60%   to 65%. Wall motion was normal; there were no regional wall   motion abnormalities. Doppler parameters are consistent with   restrictive physiology, indicative of decreased left ventricular   diastolic compliance and/or increased left atrial pressure.   Doppler parameters are consistent with high ventricular filling   pressure. - Aortic valve: Moderately calcified annulus. Trileaflet. - Mitral valve: Mildly calcified annulus. - Left atrium: The atrium was severely dilated. - Right ventricle: Systolic function was mildly reduced. - Right atrium: The atrium was mildly to  moderately dilated. - Tricuspid valve: There was mild-moderate regurgitation. - Pulmonary arteries: Incomplete TR jet to accurately estimated   PASP.  ASSESSMENT AND PLAN:  1.  Atrial fibrillation/atrial flutter: Currently on Eliquis but no AV nodal blockers.He is not had any further episodes of syncope since being seen in the hospital. He did wear a monitor that showed atrial fibrillation with nocturnal pauses of up to 3 and half seconds. He is having symptoms of weakness and fatigue, which could possibly be due to atrial fibrillation. He had an EKG in August that showed sinus rhythm. His heart rate was 73 with an EKG. It is possible that resumption of sinus rhythm would prevent him from having any further syncope or weakness or fatigue. Due to that, we'll plan for admission for Tikosyn loading and we'll hold off on pacemaker implantation at this time. Should he continue to have symptoms of weakness and fatigue, he may benefit from Ace maker implantation.  This patients CHA2DS2-VASc Score and unadjusted Ischemic Stroke Rate (% per year) is equal to 3.2 % stroke rate/year from a score of 3  Above score calculated as 1 point each if present [CHF, HTN, DM, Vascular=MI/PAD/Aortic Plaque, Age if 65-74, or Male] Above score calculated as 2 points each if present [Age > 75, or Stroke/TIA/TE]  2. Syncope: Cardiac monitor shows atrial fibrillation and flutter with variable conduction, 3-3 have second pauses in the early morning hours around 4:56 AM and also around 12:30 to 1 PM. His pauses are mostly nocturnal, and he has not passed out while wearing the monitor. We'll plan to start Tikosyn which may prevent further pauses.  3. Hypertension: Currently on losartan. No changes at this time.    Current medicines are reviewed at length with the patient today.   The patient does not have concerns regarding his medicines.  The following changes were made today:  none  Labs/ tests ordered today include:    No orders of the defined types were placed in this encounter.    Disposition:   FU with Adalina Dopson 3 months  Signed, Breckon Reeves [name], MD  04/30/2017 11:20 AM     [facility] [address] [place]  [address] [phone] (office) [phone] (fax)

## 2017-04-30 NOTE — Patient Instructions (Signed)
Medication Instructions:  Your physician recommends that you continue on your current medications as directed. Please refer to the Current Medication list given to you today.  -- If you need a refill on your cardiac medications before your next appointment, please call your pharmacy. --  Labwork: None ordered  Testing/Procedures: None ordered  Follow-Up: To be determined  Thank you for choosing [facility]!!   Trinidad Curet, RN [phone]  Any Other Special Instructions Will Be Listed Below (If Applicable). Please contact your insurance company to discuss cost of the medication and call the office once completed.  Please review Preparing for Tikosyn Admission sheet given to you today.

## 2017-05-05 ENCOUNTER — Ambulatory Visit (HOSPITAL_COMMUNITY)
Admission: RE | Admit: 2017-05-05 | Discharge: 2017-05-05 | Disposition: A | Discharge status: Home / Self Care | Payer: Medicare Other | Source: Ambulatory Visit | Attending: Nurse Practitioner | Admitting: Nurse Practitioner

## 2017-05-05 ENCOUNTER — Encounter (HOSPITAL_COMMUNITY): Payer: Self-pay | Admitting: Nurse Practitioner

## 2017-05-05 ENCOUNTER — Inpatient Hospital Stay (HOSPITAL_COMMUNITY)
Admission: AD | Admit: 2017-05-05 | Discharge: 2017-05-09 | DRG: 310 | Disposition: A | Discharge status: Home / Self Care | Payer: Medicare Other | Source: Ambulatory Visit | Attending: Cardiology | Admitting: Cardiology

## 2017-05-05 VITALS — BP 140/76 | HR 64 | Ht 71.0 in | Wt 195.8 lb

## 2017-05-05 DIAGNOSIS — Z8546 Personal history of malignant neoplasm of prostate: Secondary | ICD-10-CM | POA: Diagnosis not present

## 2017-05-05 DIAGNOSIS — Z880 Allergy status to penicillin: Secondary | ICD-10-CM

## 2017-05-05 DIAGNOSIS — I4892 Unspecified atrial flutter: Secondary | ICD-10-CM | POA: Diagnosis not present

## 2017-05-05 DIAGNOSIS — I4891 Unspecified atrial fibrillation: Secondary | ICD-10-CM | POA: Diagnosis present

## 2017-05-05 DIAGNOSIS — E039 Hypothyroidism, unspecified: Secondary | ICD-10-CM | POA: Diagnosis present

## 2017-05-05 DIAGNOSIS — K219 Gastro-esophageal reflux disease without esophagitis: Secondary | ICD-10-CM | POA: Diagnosis present

## 2017-05-05 DIAGNOSIS — I480 Paroxysmal atrial fibrillation: Principal | ICD-10-CM | POA: Diagnosis present

## 2017-05-05 DIAGNOSIS — N289 Disorder of kidney and ureter, unspecified: Secondary | ICD-10-CM | POA: Diagnosis present

## 2017-05-05 DIAGNOSIS — Z888 Allergy status to other drugs, medicaments and biological substances status: Secondary | ICD-10-CM | POA: Diagnosis not present

## 2017-05-05 DIAGNOSIS — I481 Persistent atrial fibrillation: Secondary | ICD-10-CM | POA: Diagnosis not present

## 2017-05-05 DIAGNOSIS — J302 Other seasonal allergic rhinitis: Secondary | ICD-10-CM | POA: Diagnosis not present

## 2017-05-05 DIAGNOSIS — Z96661 Presence of right artificial ankle joint: Secondary | ICD-10-CM | POA: Diagnosis present

## 2017-05-05 DIAGNOSIS — Z23 Encounter for immunization: Secondary | ICD-10-CM | POA: Diagnosis not present

## 2017-05-05 DIAGNOSIS — I119 Hypertensive heart disease without heart failure: Secondary | ICD-10-CM | POA: Diagnosis present

## 2017-05-05 DIAGNOSIS — I4510 Unspecified right bundle-branch block: Secondary | ICD-10-CM | POA: Diagnosis present

## 2017-05-05 DIAGNOSIS — I4819 Other persistent atrial fibrillation: Secondary | ICD-10-CM

## 2017-05-05 DIAGNOSIS — Z887 Allergy status to serum and vaccine status: Secondary | ICD-10-CM

## 2017-05-05 DIAGNOSIS — Z885 Allergy status to narcotic agent status: Secondary | ICD-10-CM

## 2017-05-05 DIAGNOSIS — Z8661 Personal history of infections of the central nervous system: Secondary | ICD-10-CM

## 2017-05-05 DIAGNOSIS — Z882 Allergy status to sulfonamides status: Secondary | ICD-10-CM | POA: Diagnosis not present

## 2017-05-05 LAB — BASIC METABOLIC PANEL
ANION GAP: 6 (ref 5–15)
Anion gap: 7 (ref 5–15)
BUN: 13 mg/dL (ref 6–20)
BUN: 13 mg/dL (ref 6–20)
CALCIUM: 9.5 mg/dL (ref 8.9–10.3)
CHLORIDE: 107 mmol/L (ref 101–111)
CO2: 27 mmol/L (ref 22–32)
CO2: 27 mmol/L (ref 22–32)
CREATININE: 1.17 mg/dL (ref 0.61–1.24)
Calcium: 9 mg/dL (ref 8.9–10.3)
Chloride: 108 mmol/L (ref 101–111)
Creatinine, Ser: 1.16 mg/dL (ref 0.61–1.24)
GFR calc Af Amer: >60 mL/min (ref >60)
GFR calc Af Amer: >60 mL/min (ref >60)
GFR calc non Af Amer: 58 mL/min — ABNORMAL LOW (ref >60)
GFR calc non Af Amer: 58 mL/min — ABNORMAL LOW (ref >60)
GLUCOSE: 98 mg/dL (ref 65–99)
GLUCOSE: 130 mg/dL — AB (ref 65–99)
POTASSIUM: 4.4 mmol/L (ref 3.5–5.1)
Potassium: 4 mmol/L (ref 3.5–5.1)
SODIUM: 141 mmol/L (ref 135–145)
Sodium: 141 mmol/L (ref 135–145)

## 2017-05-05 LAB — MAGNESIUM
MAGNESIUM: 2.1 mg/dL (ref 1.7–2.4)
Magnesium: 2 mg/dL (ref 1.7–2.4)

## 2017-05-05 MED ORDER — INFLUENZA VAC SPLIT HIGH-DOSE 0.5 ML IM SUSY
0.5000 mL | PREFILLED_SYRINGE | INTRAMUSCULAR | Status: AC
  Administered 2017-05-07: 0.5 mL via INTRAMUSCULAR
  Filled 2017-05-05: qty 0.5

## 2017-05-05 MED ORDER — LOSARTAN POTASSIUM 25 MG PO TABS
25.0000 mg | ORAL_TABLET | Freq: Every day | ORAL | Status: DC
  Administered 2017-05-06: 25 mg via ORAL
  Filled 2017-05-05 (×2): qty 1

## 2017-05-05 MED ORDER — VITAMIN B-12 1000 MCG PO TABS
1000.0000 ug | ORAL_TABLET | Freq: Every day | ORAL | Status: DC
  Administered 2017-05-06 – 2017-05-09 (×4): 1000 ug via ORAL
  Filled 2017-05-05 (×4): qty 1

## 2017-05-05 MED ORDER — APIXABAN 5 MG PO TABS: 5.0000 mg | ORAL_TABLET | Freq: Two times a day (BID) | ORAL | 3 refills | Status: DC

## 2017-05-05 MED ORDER — GABAPENTIN 300 MG PO CAPS
300.0000 mg | ORAL_CAPSULE | Freq: Two times a day (BID) | ORAL | Status: DC
  Administered 2017-05-05 – 2017-05-09 (×8): 300 mg via ORAL
  Filled 2017-05-05 (×8): qty 1

## 2017-05-05 MED ORDER — SODIUM CHLORIDE 0.9% FLUSH: 3.0000 mL | INTRAVENOUS | Status: DC | PRN

## 2017-05-05 MED ORDER — APIXABAN 5 MG PO TABS
5.0000 mg | ORAL_TABLET | Freq: Two times a day (BID) | ORAL | Status: DC
  Administered 2017-05-05 – 2017-05-09 (×8): 5 mg via ORAL
  Filled 2017-05-05 (×8): qty 1

## 2017-05-05 MED ORDER — OMEGA-3-ACID ETHYL ESTERS 1 G PO CAPS
1000.0000 mg | ORAL_CAPSULE | Freq: Every day | ORAL | Status: DC
  Administered 2017-05-06 – 2017-05-09 (×4): 1000 mg via ORAL
  Filled 2017-05-05 (×4): qty 1

## 2017-05-05 MED ORDER — FINASTERIDE 5 MG PO TABS
5.0000 mg | ORAL_TABLET | Freq: Every day | ORAL | Status: DC
  Administered 2017-05-06 – 2017-05-09 (×4): 5 mg via ORAL
  Filled 2017-05-05 (×4): qty 1

## 2017-05-05 MED ORDER — SODIUM CHLORIDE 0.9 % IV SOLN: 250.0000 mL | INTRAVENOUS | Status: DC | PRN

## 2017-05-05 MED ORDER — DOFETILIDE 500 MCG PO CAPS: 500.0000 ug | ORAL_CAPSULE | Freq: Two times a day (BID) | ORAL | Status: DC

## 2017-05-05 MED ORDER — TAMSULOSIN HCL 0.4 MG PO CAPS
0.4000 mg | ORAL_CAPSULE | Freq: Every day | ORAL | Status: DC
  Administered 2017-05-06 – 2017-05-09 (×4): 0.4 mg via ORAL
  Filled 2017-05-05 (×4): qty 1

## 2017-05-05 MED ORDER — SODIUM CHLORIDE 0.9% FLUSH
3.0000 mL | Freq: Two times a day (BID) | INTRAVENOUS | Status: DC
  Administered 2017-05-05: 3 mL via INTRAVENOUS

## 2017-05-05 MED ORDER — LEVOTHYROXINE SODIUM 75 MCG PO TABS
75.0000 ug | ORAL_TABLET | Freq: Every day | ORAL | Status: DC
  Administered 2017-05-06 – 2017-05-09 (×4): 75 ug via ORAL
  Filled 2017-05-05 (×4): qty 1

## 2017-05-05 NOTE — H&P (Addendum)
Primary Care Physician: Redmond School, MD Referring Physician: Dr. Jonn Shingles Brent Wilkins is a 76 y.o. male with a h/o HTN, persistent atrial fibrillation, that is in the afib clinic for admission for tikosyn. He has a syncopal episode in April, bradycardia and not on any AV nodal blockers. He is aware of the cost of the drug, no recent benadryl use, no missed doses of eliquis 5 mg bid.  Today, he denies symptoms of palpitations, chest pain, shortness of breath, orthopnea, PND, lower extremity edema, dizziness, presyncope, syncope, or neurologic sequela. The patient is tolerating medications without difficulties and is otherwise without complaint today.         Past Medical History:  Diagnosis Date  . Arthritis   . Dysphagia   . Frequent urination at night   . GERD (gastroesophageal reflux disease)   . History of kidney stones   . HTN (hypertension)   . Hypothyroidism   . 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 Neuropathy    Bilateral ankles  . Prostate cancer [facility]) 2010   Dr. Rosana Hoes  . Seasonal allergies   . Syncope  dx. 8 yrs ago        Past Surgical History:  Procedure Laterality Date  . APPENDECTOMY    . CATARACT EXTRACTION W/PHACO Left 03/27/2015   Procedure: CATARACT EXTRACTION PHACO AND INTRAOCULAR LENS PLACEMENT LEFT EYE CDE=11.73;  Surgeon: Tonny Branch, MD;  Location: [facility];  Service: Ophthalmology;  Laterality: Left;  . CATARACT EXTRACTION W/PHACO Right 03/30/2015   Procedure: CATARACT EXTRACTION PHACO AND INTRAOCULAR LENS PLACEMENT RIGHT EYE CDE=7.35;  Surgeon: Tonny Branch, MD;  [facility]: [facility];  Service: Ophthalmology;  Laterality: Right;  . CHOLECYSTECTOMY    . COLONOSCOPY  07/23/10   Dr. Vivi Ferns rectum, scattered pancolonic diverticula  . COLONOSCOPY  08/10/1999   internal hemorrhoids,inflammatory polyp  . ESOPHAGOGASTRODUODENOSCOPY  04/12/08   Prominent Schatzki's ring, erosive reflux esophagitis, multiple antral erosions, small  hiatal hernia, reactive gastropathy, status post dilation with 14F  . ESOPHAGOGASTRODUODENOSCOPY  10/28/2011   Procedure: ESOPHAGOGASTRODUODENOSCOPY (EGD);  Surgeon: Daneil Dolin, MD;  [facility];  Service: Endoscopy;  Laterality: N/A;  1:30  . MALONEY DILATION  10/28/2011   Procedure: Venia Minks DILATION;  Surgeon: Daneil Dolin, MD;  [facility];  Service: Endoscopy;  Laterality: N/A;  . SAVORY DILATION  10/28/2011   Procedure: SAVORY DILATION;  Surgeon: Daneil Dolin, MD;  [facility];  Service: Endoscopy;  Laterality: N/A;  . TONSILLECTOMY    . TOTAL ANKLE ARTHROPLASTY Left 11/25/2013   Procedure: TOTAL ANKLE ARTHOPLASTY;  Surgeon: Wylene Simmer, MD;  Location: [place];  Service: Orthopedics;  Laterality: Left;  . TOTAL ANKLE ARTHROPLASTY Right 07/14/2014   dr hewitt  . TOTAL ANKLE ARTHROPLASTY Right 07/14/2014   Procedure: RIGHT TOTAL ANKLE ARTHOPLASTY;  Surgeon: Wylene Simmer, MD;  Location: [place];  Service: Orthopedics;  Laterality: Right;          Current Outpatient Prescriptions  Medication Sig Dispense Refill  . apixaban (ELIQUIS) 5 MG TABS tablet Take 1 tablet (5 mg total) by mouth 2 (two) times daily. 180 tablet 3  . finasteride (PROSCAR) 5 MG tablet Take 5 mg by mouth daily.    . fluticasone (FLONASE) 50 MCG/ACT nasal spray Place 1 spray into both nostrils daily as needed for allergies.     Marland Kitchen gabapentin (NEURONTIN) 300 MG capsule Take 300 mg by mouth 2 (two) times daily.     Marland Kitchen  levothyroxine (SYNTHROID, LEVOTHROID) 75 MCG tablet Take 75 mcg by mouth daily.  0  . losartan (COZAAR) 25 MG tablet Take 1 tablet (25 mg total) by mouth daily. 90 tablet 3  . Omega-3 Fatty Acids (RA FISH OIL) 1400 MG CPDR Take 1,400 mg by mouth daily.    Vladimir Faster Glycol-Propyl Glycol (SYSTANE) 0.4-0.3 % SOLN Place 1-2 drops into both eyes 2 (two) times daily as needed.    . Pseudoephedrine-Acetaminophen (SINUS HEADACHE/NON-DROWSY PO) Take 2 tablets  by mouth daily as needed.    . Tamsulosin HCl (FLOMAX) 0.4 MG CAPS Take 0.4 mg by mouth daily after breakfast.     . vitamin B-12 (CYANOCOBALAMIN) 500 MCG tablet Take 1,000 mcg by mouth daily.      No current facility-administered medications for this encounter.          Allergies  Allergen Reactions  . Oxycodone     Severe constipation  . Statins     Hard to walk, severe leg cramps  . Tetanus Toxoids     Swells up  . Doxycycline Rash  . Penicillins Swelling and Rash    .Has patient had a PCN reaction causing immediate rash, facial/tongue/throat swelling, SOB or lightheadedness with hypotension: Yes Has patient had a PCN reaction causing severe rash involving mucus membranes or skin necrosis: No Has patient had a PCN reaction that required hospitalization: Yes Has patient had a PCN reaction occurring within the last 10 years: No If all of the above answers are "NO", then may proceed with Cephalosporin use.   . Sulfa Antibiotics Swelling and Rash    Social History        Social History  . Marital status: Married    Spouse name: N/A  . Number of children: 2  . Years of education: N/A       Occupational History  . retired; Nurse, learning disability Retired         Social History Main Topics  . Smoking status: Never Smoker  . Smokeless tobacco: Never Used  . Alcohol use 0.6 - 1.2 oz/week    1 - 2 Cans of beer per week     Comment: 2 beers a week  . Drug use: No  . Sexual activity: Not on file       Other Topics Concern  . Not on file      Social History Narrative  . No narrative on file         Family History  Problem Relation Age of Onset  . Prostate cancer Father   . Prostate cancer Son 53  . Prostate cancer Brother   . Colon cancer Neg Hx     ROS- All systems are reviewed and negative except as per the HPI above  Physical Exam:    Vitals:   05/05/17 0935  BP: 140/76  Pulse: 64  Weight: 195 lb 12.8 oz  (88.8 kg)  Height: 5\' 11"  (1.803 m)      Wt Readings from Last 3 Encounters:  05/05/17 195 lb 12.8 oz (88.8 kg)  04/30/17 193 lb 3.2 oz (87.6 kg)  04/28/17 195 lb (88.5 kg)    Labs: Recent Labs       Lab Results  Component Value Date   NA 141 05/05/2017   K 4.0 05/05/2017   CL 108 05/05/2017   CO2 27 05/05/2017   GLUCOSE 98 05/05/2017   BUN 13 05/05/2017   CREATININE 1.16 05/05/2017   CALCIUM 9.5 05/05/2017   MG  2.0 05/05/2017     Recent Labs  No results found for: INR   Recent Labs       Lab Results  Component Value Date   CHOL 141 11/14/2016   HDL 50 11/14/2016   LDLCALC 74 11/14/2016   TRIG 85 11/14/2016       GEN- The patient is well appearing, alert and oriented x 3 today.   Head- normocephalic, atraumatic Eyes-  Sclera clear, conjunctiva pink Ears- hearing intact Oropharynx- clear Neck- supple, no JVP Lymph- no cervical lymphadenopathy Lungs- Clear to ausculation bilaterally, normal work of breathing Heart- irregular rate and rhythm, no murmurs, rubs or gallops, PMI not laterally displaced GI- soft, NT, ND, + BS Extremities- no clubbing, cyanosis, or edema MS- no significant deformity or atrophy Skin- no rash or lesion Psych- euthymic mood, full affect Neuro- strength and sensation are intact  EKG- afib at 64 bpm, qrs int 140 ms, qtc 425 ms Echo-Study Conclusions  - Left ventricle: The cavity size was normal. Wall thickness was increased in a pattern of moderate LVH. Systolic function was normal. The estimated ejection fraction was in the range of 60% to 65%. Wall motion was normal; there were no regional wall motion abnormalities. Doppler parameters are consistent with restrictive physiology, indicative of decreased left ventricular diastolic compliance and/or increased left atrial pressure. Doppler parameters are consistent with high ventricular filling pressure. - Aortic valve: Moderately calcified  annulus. Trileaflet. - Mitral valve: Mildly calcified annulus. - Left atrium: The atrium was severely dilated. - Right ventricle: Systolic function was mildly reduced. - Right atrium: The atrium was mildly to moderately dilated. - Tricuspid valve: There was mild-moderate regurgitation. - Pulmonary arteries: Incomplete TR jet to accurately estimated PASP.    Assessment and Plan: 1. 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 Heart tracings from 2016 have all been atrial fibrillation until the immediate post-syncopal ECG showing sinus rhythm. The event recorder strips all demonstrate atrial fibrillation not withstanding the fact that the summary statement describes 30% atrial fibrillation burden. Furthermore, the symptoms of concern over the last 2 or 3 years have been lightheadedness and these are markedly attenuated though not totally eliminated following the elimination of his rate controlling medications. In this regard he notes that he has never had rapid atrial fibrillation.  It makes me question the value of the restoration of sinus rhythm and his atrial dimensions may be not sanguine that sinus rhythm would be maintaining normal.  The concern that I have regarding the post-syncopal ECG demonstrated sinus rhythm with all prior ECGs demonstrating atrial  fibrillation and given the abrupt nature of his syncopal event and its duration being 5-10 seconds that he had a posttermination pauses for whatever reason after all these years. His event recorder shows some degree of AV nodal conduction disease with daytime pauses of 3+ seconds  I have reviewed these concerns  with the family I also discussed this with Dr. Carlyn Reichert.  I will defer to their discussions tomorrow the final plan but will hold dofetilide for tonight for the reasons outlined above. I wonder whether he might not be well served with an implantable loop recorder with efforts to try to correlate his lightheaded spells with his rhythm.  I have also requested that his event recorder strips be collected  to see whether there is sinus rhythm interspersed with the atrial fibrillation as suggested by the summary report. In this regard, he notes few changes in symptoms over the last couple weeks and again speaks to the unlikely nature that maintaining a sinus rhythm would be associated with symptomatic improvement.

## 2017-05-05 NOTE — Progress Notes (Signed)
Pharmacy Review for Dofetilide (Tikosyn) Initiation  Admit Complaint: 76 y.o. male admitted 05/05/2017 with atrial fibrillation to be initiated on dofetilide.   Assessment:  Patient Exclusion Criteria: If any screening criteria checked as "Yes", then  patient  should NOT receive dofetilide until criteria item is corrected. If "Yes" please indicate correction plan.  YES  NO Patient  Exclusion Criteria Correction Plan  []  [x]  Baseline QTc interval is greater than or equal to 440 msec. IF above YES box checked dofetilide contraindicated unless patient has ICD; then may proceed if QTc 500-550 msec or with known ventricular conduction abnormalities may proceed with QTc 550-600 msec. QTc = 425   []  [x]  Magnesium level is less than 1.8 mEq/l : Last magnesium:  Lab Results  Component Value Date   MG 2.0 05/05/2017         []  [x]  Potassium level is less than 4 mEq/l : Last potassium:  Lab Results  Component Value Date   K 4.0 05/05/2017         []  [x]  Patient is known or suspected to have a digoxin level greater than 2 ng/ml: No results found for: DIGOXIN    []  [x]  Creatinine clearance less than 20 ml/min (calculated using Cockcroft-Gault, actual body weight and serum creatinine): Estimated Creatinine Clearance: 54.2 mL/min (by C-G formula based on SCr of 1.16 mg/dL).    []  [x]  Patient has received drugs known to prolong the QT intervals within the last 48 hours (phenothiazines, tricyclics or tetracyclic antidepressants, erythromycin, H-1 antihistamines, cisapride, fluoroquinolones, azithromycin). Drugs not listed above may have an, as yet, undetected potential to prolong the QT interval, updated information on QT prolonging agents is available at this website:QT prolonging agents   []  [x]  Patient received a dose of hydrochlorothiazide (Oretic) alone or in any combination including triamterene (Dyazide, Maxzide) in the last 48 hours.   []  [x]  Patient received a medication known to increase  dofetilide plasma concentrations prior to initial dofetilide dose:  . Trimethoprim (Primsol, Proloprim) in the last 36 hours . Verapamil (Calan, Verelan) in the last 36 hours or a sustained release dose in the last 72 hours . Megestrol (Megace) in the last 5 days  . Cimetidine (Tagamet) in the last 6 hours . Ketoconazole (Nizoral) in the last 24 hours . Itraconazole (Sporanox) in the last 48 hours  . Prochlorperazine (Compazine) in the last 36 hours    []  [x]  Patient is known to have a history of torsades de pointes; congenital or acquired long QT syndromes.   []  [x]  Patient has received a Class 1 antiarrhythmic with less than 2 half-lives since last dose. (Disopyramide, Quinidine, Procainamide, Lidocaine, Mexiletine, Flecainide, Propafenone)   []  [x]  Patient has received amiodarone therapy in the past 3 months or amiodarone level is greater than 0.3 ng/ml.    Patient has been appropriately anticoagulated with apixaban.  Ordering provider was confirmed at [email or web address] if they are not listed on the [place] Prescribers list.  Goal of Therapy: Follow renal function, electrolytes, potential drug interactions, and dose adjustment. Provide education and 1 week supply at discharge.  Plan:  [x]   Physician selected initial dose within range recommended for patients level of renal function - will monitor for response.  []   Physician selected initial dose outside of range recommended for patients level of renal function - will discuss if the dose should be altered at this time.   Select One Calculated CrCl  Dose q12h  [x]  > 60 ml/min 500 mcg  []   40-60 ml/min 250 mcg  []  20-40 ml/min 125 mcg   2. Follow up QTc after the first 5 doses, renal function, electrolytes (K & Mg) daily x 3 days, dose adjustment, success of initiation and facilitate 1 week discharge supply as     clinically indicated.  Erin Hearing PharmD., BCPS Clinical Pharmacist Pager [phone] 05/05/2017 6:03  PM

## 2017-05-05 NOTE — Progress Notes (Signed)
Primary Care Physician: Redmond School, MD Referring Physician: Dr. Jonn Shingles Brent Wilkins is a 76 y.o. male with a h/o HTN, persistent atrial fibrillation, that is in the afib clinic for admission for tikosyn. He has a syncopal episode in April, bradycardia and not on any AV nodal blockers. He is aware of the cost of the drug, no recent benadryl use, no missed doses of eliquis 5 mg bid.  Today, he denies symptoms of palpitations, chest pain, shortness of breath, orthopnea, PND, lower extremity edema, dizziness, presyncope, syncope, or neurologic sequela. The patient is tolerating medications without difficulties and is otherwise without complaint today.   Past Medical History:  Diagnosis Date  . Arthritis   . Dysphagia   . Frequent urination at night   . GERD (gastroesophageal reflux disease)   . History of kidney stones   . HTN (hypertension)   . Hypothyroidism   . 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 Neuropathy    Bilateral ankles  . Prostate cancer [facility]) 2010   Dr. Rosana Hoes  . Seasonal allergies   . Syncope  dx. 8 yrs ago   Past Surgical History:  Procedure Laterality Date  . APPENDECTOMY    . CATARACT EXTRACTION W/PHACO Left 03/27/2015   Procedure: CATARACT EXTRACTION PHACO AND INTRAOCULAR LENS PLACEMENT LEFT EYE CDE=11.73;  Surgeon: Tonny Branch, MD;  Location: [facility];  Service: Ophthalmology;  Laterality: Left;  . CATARACT EXTRACTION W/PHACO Right 03/30/2015   Procedure: CATARACT EXTRACTION PHACO AND INTRAOCULAR LENS PLACEMENT RIGHT EYE CDE=7.35;  Surgeon: Tonny Branch, MD;  Location: [facility];  Service: Ophthalmology;  Laterality: Right;  . CHOLECYSTECTOMY    . COLONOSCOPY  07/23/10   Dr. Vivi Ferns rectum, scattered pancolonic diverticula  . COLONOSCOPY  08/10/1999   internal hemorrhoids,inflammatory polyp  . ESOPHAGOGASTRODUODENOSCOPY  04/12/08   Prominent Schatzki's ring, erosive reflux esophagitis, multiple antral erosions, small hiatal hernia, reactive gastropathy,  status post dilation with 59F  . ESOPHAGOGASTRODUODENOSCOPY  10/28/2011   Procedure: ESOPHAGOGASTRODUODENOSCOPY (EGD);  Surgeon: Daneil Dolin, MD;  Location: [facility];  Service: Endoscopy;  Laterality: N/A;  1:30  . MALONEY DILATION  10/28/2011   Procedure: Venia Minks DILATION;  Surgeon: Daneil Dolin, MD;  Location: [facility];  Service: Endoscopy;  Laterality: N/A;  . SAVORY DILATION  10/28/2011   Procedure: SAVORY DILATION;  Surgeon: Daneil Dolin, MD;  Location: [facility];  Service: Endoscopy;  Laterality: N/A;  . TONSILLECTOMY    . TOTAL ANKLE ARTHROPLASTY Left 11/25/2013   Procedure: TOTAL ANKLE ARTHOPLASTY;  Surgeon: Wylene Simmer, MD;  Location: [facility];  Service: Orthopedics;  Laterality: Left;  . TOTAL ANKLE ARTHROPLASTY Right 07/14/2014   dr hewitt  . TOTAL ANKLE ARTHROPLASTY Right 07/14/2014   Procedure: RIGHT TOTAL ANKLE ARTHOPLASTY;  Surgeon: Wylene Simmer, MD;  Location: [employer];  Service: Orthopedics;  Laterality: Right;    Current Outpatient Prescriptions  Medication Sig Dispense Refill  . apixaban (ELIQUIS) 5 MG TABS tablet Take 1 tablet (5 mg total) by mouth 2 (two) times daily. 180 tablet 3  . finasteride (PROSCAR) 5 MG tablet Take 5 mg by mouth daily.    . fluticasone (FLONASE) 50 MCG/ACT nasal spray Place 1 spray into both nostrils daily as needed for allergies.     Marland Kitchen gabapentin (NEURONTIN) 300 MG capsule Take 300 mg by mouth 2 (two) times daily.     Marland Kitchen levothyroxine (SYNTHROID, LEVOTHROID) 75 MCG tablet Take 75 mcg by mouth daily.  0  .  losartan (COZAAR) 25 MG tablet Take 1 tablet (25 mg total) by mouth daily. 90 tablet 3  . Omega-3 Fatty Acids (RA FISH OIL) 1400 MG CPDR Take 1,400 mg by mouth daily.    Vladimir Faster Glycol-Propyl Glycol (SYSTANE) 0.4-0.3 % SOLN Place 1-2 drops into both eyes 2 (two) times daily as needed.    . Pseudoephedrine-Acetaminophen (SINUS HEADACHE/NON-DROWSY PO) Take 2 tablets by mouth daily as needed.    . Tamsulosin HCl (FLOMAX) 0.4 MG CAPS  Take 0.4 mg by mouth daily after breakfast.     . vitamin B-12 (CYANOCOBALAMIN) 500 MCG tablet Take 1,000 mcg by mouth daily.      No current facility-administered medications for this encounter.     Allergies  Allergen Reactions  . Oxycodone     Severe constipation  . Statins     Hard to walk, severe leg cramps  . Tetanus Toxoids     Swells up  . Doxycycline Rash  . Penicillins Swelling and Rash    .Has patient had a PCN reaction causing immediate rash, facial/tongue/throat swelling, SOB or lightheadedness with hypotension: Yes Has patient had a PCN reaction causing severe rash involving mucus membranes or skin necrosis: No Has patient had a PCN reaction that required hospitalization: Yes Has patient had a PCN reaction occurring within the last 10 years: No If all of the above answers are "NO", then may proceed with Cephalosporin use.   . Sulfa Antibiotics Swelling and Rash    Social History   Social History  . Marital status: Married    Spouse name: N/A  . Number of children: 2  . Years of education: N/A   Occupational History  . retired; Nurse, learning disability Retired   Social History Main Topics  . Smoking status: Never Smoker  . Smokeless tobacco: Never Used  . Alcohol use 0.6 - 1.2 oz/week    1 - 2 Cans of beer per week     Comment: 2 beers a week  . Drug use: No  . Sexual activity: Not on file   Other Topics Concern  . Not on file   Social History Narrative  . No narrative on file    Family History  Problem Relation Age of Onset  . Prostate cancer Father   . Prostate cancer Son 62  . Prostate cancer Brother   . Colon cancer Neg Hx     ROS- All systems are reviewed and negative except as per the HPI above  Physical Exam: Vitals:   05/05/17 0935  BP: 140/76  Pulse: 64  Weight: 195 lb 12.8 oz (88.8 kg)  Height: 5\' 11"  (1.803 m)   Wt Readings from Last 3 Encounters:  05/05/17 195 lb 12.8 oz (88.8 kg)  04/30/17 193 lb 3.2 oz (87.6 kg)  04/28/17  195 lb (88.5 kg)    Labs: Lab Results  Component Value Date   NA 141 05/05/2017   K 4.0 05/05/2017   CL 108 05/05/2017   CO2 27 05/05/2017   GLUCOSE 98 05/05/2017   BUN 13 05/05/2017   CREATININE 1.16 05/05/2017   CALCIUM 9.5 05/05/2017   MG 2.0 05/05/2017   No results found for: INR Lab Results  Component Value Date   CHOL 141 11/14/2016   HDL 50 11/14/2016   LDLCALC 74 11/14/2016   TRIG 85 11/14/2016     GEN- The patient is well appearing, alert and oriented x 3 today.   Head- normocephalic, atraumatic Eyes-  Sclera clear, conjunctiva pink  Ears- hearing intact Oropharynx- clear Neck- supple, no JVP Lymph- no cervical lymphadenopathy Lungs- Clear to ausculation bilaterally, normal work of breathing Heart- irregular rate and rhythm, no murmurs, rubs or gallops, PMI not laterally displaced GI- soft, NT, ND, + BS Extremities- no clubbing, cyanosis, or edema MS- no significant deformity or atrophy Skin- no rash or lesion Psych- euthymic mood, full affect Neuro- strength and sensation are intact  EKG- afib at 64 bpm, qrs int 140 ms, qtc 425 ms Echo-Study Conclusions  - Left ventricle: The cavity size was normal. Wall thickness was   increased in a pattern of moderate LVH. Systolic function was   normal. The estimated ejection fraction was in the range of 60%   to 65%. Wall motion was normal; there were no regional wall   motion abnormalities. Doppler parameters are consistent with   restrictive physiology, indicative of decreased left ventricular   diastolic compliance and/or increased left atrial pressure.   Doppler parameters are consistent with high ventricular filling   pressure. - Aortic valve: Moderately calcified annulus. Trileaflet. - Mitral valve: Mildly calcified annulus. - Left atrium: The atrium was severely dilated. - Right ventricle: Systolic function was mildly reduced. - Right atrium: The atrium was mildly to moderately dilated. - Tricuspid  valve: There was mild-moderate regurgitation. - Pulmonary arteries: Incomplete TR jet to accurately estimated   PASP.    Assessment and Plan: 1. Persistent afib General precautions re tikosyn admission  To be admitted today for Tikosyn, currently no bed availability, admissions will notify pt later today when bed is ready. No benadryl use No missed doses of eliquis Drugs have been screened by PharmD and no issues Knows cost of drugs and pt assistance forms given to pt Crcl cal  at 65.66, Kt at 4.0 and mag at 2.0 qtc today at 425 ms with RBBB  Butch Penny C. Torin Whisner, [facility] [address] [place], [place] [address] [phone]

## 2017-05-06 ENCOUNTER — Other Ambulatory Visit: Payer: Self-pay

## 2017-05-06 ENCOUNTER — Encounter (HOSPITAL_COMMUNITY): Payer: Self-pay

## 2017-05-06 LAB — COMPREHENSIVE METABOLIC PANEL
ALBUMIN: 3.2 g/dL — AB (ref 3.5–5.0)
ALT: 11 U/L — AB (ref 17–63)
AST: 28 U/L (ref 15–41)
Alkaline Phosphatase: 63 U/L (ref 38–126)
Anion gap: 8 (ref 5–15)
BUN: 13 mg/dL (ref 6–20)
CHLORIDE: 107 mmol/L (ref 101–111)
CO2: 26 mmol/L (ref 22–32)
Calcium: 9.4 mg/dL (ref 8.9–10.3)
Creatinine, Ser: 1.15 mg/dL (ref 0.61–1.24)
GFR calc Af Amer: >60 mL/min (ref >60)
GFR, EST NON AFRICAN AMERICAN: 59 mL/min — AB (ref >60)
Glucose, Bld: 89 mg/dL (ref 65–99)
POTASSIUM: 4.3 mmol/L (ref 3.5–5.1)
SODIUM: 141 mmol/L (ref 135–145)
Total Bilirubin: 0.9 mg/dL (ref 0.3–1.2)
Total Protein: 6 g/dL — ABNORMAL LOW (ref 6.5–8.1)

## 2017-05-06 LAB — CBC
HEMATOCRIT: 44.6 % (ref 39.0–52.0)
HEMOGLOBIN: 15.2 g/dL (ref 13.0–17.0)
MCH: 30.8 pg (ref 26.0–34.0)
MCHC: 34.1 g/dL (ref 30.0–36.0)
MCV: 90.5 fL (ref 78.0–100.0)
Platelets: 204 10*3/uL (ref 150–400)
RBC: 4.93 MIL/uL (ref 4.22–5.81)
RDW: 13.7 % (ref 11.5–15.5)
WBC: 7.4 10*3/uL (ref 4.0–10.5)

## 2017-05-06 LAB — MAGNESIUM: MAGNESIUM: 2 mg/dL (ref 1.7–2.4)

## 2017-05-06 MED ORDER — SODIUM CHLORIDE 0.9 % IV SOLN: 250.0000 mL | INTRAVENOUS | Status: DC | PRN

## 2017-05-06 MED ORDER — DOFETILIDE 500 MCG PO CAPS
500.0000 ug | ORAL_CAPSULE | Freq: Two times a day (BID) | ORAL | Status: DC
  Administered 2017-05-06 – 2017-05-09 (×7): 500 ug via ORAL
  Filled 2017-05-06 (×7): qty 1

## 2017-05-06 MED ORDER — SODIUM CHLORIDE 0.9% FLUSH
3.0000 mL | Freq: Two times a day (BID) | INTRAVENOUS | Status: DC
  Administered 2017-05-06 – 2017-05-08 (×6): 3 mL via INTRAVENOUS

## 2017-05-06 MED ORDER — SODIUM CHLORIDE 0.9% FLUSH: 3.0000 mL | INTRAVENOUS | Status: DC | PRN

## 2017-05-06 NOTE — Progress Notes (Signed)
EKG done 3 hours after 1st Tikosyn load. EKG showed Sinus Rhythm with 1 degree HB. Telemetry shows NSR. Patient just stated he felt flushed post-administration of Tikosyn. Vital signs stable. Will continue to monitor.

## 2017-05-06 NOTE — Progress Notes (Signed)
Progress Note  Patient Name: Brent Wilkins Date of Encounter: 05/06/2017  Primary Cardiologist: Curt Bears  Subjective   Feeling well with minimal fatigue and SOB. Did not receive tikosy last night.  Inpatient Medications    Scheduled Meds: . apixaban  5 mg Oral BID  . dofetilide  500 mcg Oral BID  . finasteride  5 mg Oral Daily  . gabapentin  300 mg Oral BID  . [START ON 05/07/2017] Influenza vac split quadrivalent PF  0.5 mL Intramuscular Tomorrow-1000  . levothyroxine  75 mcg Oral QAC breakfast  . losartan  25 mg Oral Daily  . omega-3 acid ethyl esters  1,000 mg Oral Daily  . sodium chloride flush  3 mL Intravenous Q12H  . sodium chloride flush  3 mL Intravenous Q12H  . tamsulosin  0.4 mg Oral QPC breakfast  . vitamin B-12  1,000 mcg Oral Daily   Continuous Infusions: . sodium chloride    . sodium chloride     PRN Meds: sodium chloride, sodium chloride, sodium chloride flush, sodium chloride flush   Vital Signs    Vitals:   05/05/17 1728 05/05/17 2026 05/06/17 0500  BP: (!) 178/85 (!) 158/90 (!) 158/99  Pulse: 66 (!) 51 68  Resp: 17 18 16   Temp: 98 F (36.7 C) 98 F (36.7 C) 98.7 F (37.1 C)  TempSrc: Oral Oral   SpO2: 100% 100% 96%  Weight: 190 lb (86.2 kg)    Height: 5\' 10"  (1.778 m)      Intake/Output Summary (Last 24 hours) at 05/06/17 0853 Last data filed at 05/06/17 0500  Gross per 24 hour  Intake              240 ml  Output              700 ml  Net             -460 ml   Filed Weights   05/05/17 1728  Weight: 190 lb (86.2 kg)    Telemetry    Atrial fibrillation - Personally Reviewed  ECG    Atrial fibrillation, RBBB - Personally Reviewed  Physical Exam   GEN: No acute distress.   Neck: No JVD Cardiac: RRR, no murmurs, rubs, or gallops.  Respiratory: Clear to auscultation bilaterally. GI: Soft, nontender, non-distended  MS: No edema; No deformity. Neuro:  Nonfocal  Psych: Normal affect   Labs    Chemistry Recent Labs Lab  05/05/17 0943 05/05/17 1907 05/06/17 0406  NA 141 141 141  K 4.0 4.4 4.3  CL 108 107 107  CO2 27 27 26   GLUCOSE 98 130* 89  BUN 13 13 13   CREATININE 1.16 1.17 1.15  CALCIUM 9.5 9.0 9.4  PROT  --   --  6.0*  ALBUMIN  --   --  3.2*  AST  --   --  28  ALT  --   --  11*  ALKPHOS  --   --  63  BILITOT  --   --  0.9  GFRNONAA 58* 58* 59*  GFRAA >60 >60 >60  ANIONGAP 6 7 8      Hematology Recent Labs Lab 05/06/17 0406  WBC 7.4  RBC 4.93  HGB 15.2  HCT 44.6  MCV 90.5  MCH 30.8  MCHC 34.1  RDW 13.7  PLT 204    Cardiac EnzymesNo results for input(s): TROPONINI in the last 168 hours. No results for input(s): TROPIPOC in the last 168 hours.  BNPNo results for input(s): BNP, PROBNP in the last 168 hours.   DDimer No results for input(s): DDIMER in the last 168 hours.   Radiology    No results found.  Cardiac Studies   TTE - Left ventricle: The cavity size was normal. Wall thickness was   increased in a pattern of moderate LVH. Systolic function was   normal. The estimated ejection fraction was in the range of 60%   to 65%. Wall motion was normal; there were no regional wall   motion abnormalities. Doppler parameters are consistent with   restrictive physiology, indicative of decreased left ventricular   diastolic compliance and/or increased left atrial pressure.   Doppler parameters are consistent with high ventricular filling   pressure. - Aortic valve: Moderately calcified annulus. Trileaflet. - Mitral valve: Mildly calcified annulus. - Left atrium: The atrium was severely dilated. - Right ventricle: Systolic function was mildly reduced. - Right atrium: The atrium was mildly to moderately dilated. - Tricuspid valve: There was mild-moderate regurgitation. - Pulmonary arteries: Incomplete TR jet to accurately estimated   PASP.  Patient Profile     76 y.o. male with a episode of syncope and atrial fibrillaiton presents for Tikosyn initiation.  Assessment &  Plan    Atrial fibrillation: Plan for Tikosyn with first dose today. QTc 412 on ECG as outpatient. No missed doses of Eliquis. Stavroula Rohde monitor for QTc changes with serial ECGs as well as BMP daily to monitor K and Mg. Plan for cardioversion Wednesday if not in sinus rhythm.  Hypertension: well controlled.  For questions or updates, please contact [place] Please consult [email or web address] for contact info under Cardiology/STEMI.      Signed, Meleny Tregoning [name], MD  05/06/2017, 8:53 AM

## 2017-05-06 NOTE — Care Management Note (Addendum)
Case Management Note  Patient Details  Name: Brent Wilkins MRN: [id number] Date of Birth: July 07, 1941  Subjective/Objective:        Pt presented for Tikosyn load.  Benefits check completed. CM will assist with 14-day supply from main pharmacy.  Pt will need Rx for 14 day supply Tikosyn with no refill.  Also needed is the original RX for 90-day supply with refills.  RX will be sent to [facility].     Action/Plan: S/W KATHY @ [facility] # [phone]   1. TIKOSYN 500 MCG BID CAPSULE  COVER- NONE FORMULARY  PRIOR APPROVAL- YES # [phone] FOR EXCEPTION    2, DOFETILIDE 500 MCG BID CAPSULE  COVER- YES  CO-PAY- $ 160.76  TIER- 4 DRUG  PRIOR APPROVAL- NO   DEDUCTIBLE - NOT MET AND PATIENT IN COVERAGE GAP   PREFERRED PHARMACY : [facility]   Expected Discharge Date:                  Expected Discharge Plan:  Home/Self Care  In-House Referral:  NA  Discharge planning Services  CM Consult, Medication Assistance  Post Acute Care Choice:  NA Choice offered to:  NA  DME Arranged:  N/A DME Agency:  NA  HH Arranged:  NA HH Agency:  NA  Status of Service:  Completed, signed off  If discussed at [place] of Stay Meetings, dates discussed:    Additional Comments:  Arley Phenix, RN 05/06/2017, 3:00 PM

## 2017-05-07 LAB — BASIC METABOLIC PANEL
Anion gap: 8 (ref 5–15)
BUN: 13 mg/dL (ref 6–20)
CO2: 26 mmol/L (ref 22–32)
CREATININE: 1.21 mg/dL (ref 0.61–1.24)
Calcium: 8.9 mg/dL (ref 8.9–10.3)
Chloride: 106 mmol/L (ref 101–111)
GFR calc Af Amer: >60 mL/min (ref >60)
GFR, EST NON AFRICAN AMERICAN: 56 mL/min — AB (ref >60)
Glucose, Bld: 86 mg/dL (ref 65–99)
Potassium: 3.9 mmol/L (ref 3.5–5.1)
SODIUM: 140 mmol/L (ref 135–145)

## 2017-05-07 LAB — MAGNESIUM: MAGNESIUM: 2 mg/dL (ref 1.7–2.4)

## 2017-05-07 MED ORDER — POTASSIUM CHLORIDE CRYS ER 20 MEQ PO TBCR
20.0000 meq | EXTENDED_RELEASE_TABLET | Freq: Once | ORAL | Status: AC
  Administered 2017-05-07: 20 meq via ORAL
  Filled 2017-05-07: qty 1

## 2017-05-07 MED ORDER — PANTOPRAZOLE SODIUM 40 MG PO TBEC
40.0000 mg | DELAYED_RELEASE_TABLET | Freq: Two times a day (BID) | ORAL | Status: DC
  Administered 2017-05-07 – 2017-05-09 (×4): 40 mg via ORAL
  Filled 2017-05-07 (×4): qty 1

## 2017-05-07 MED ORDER — LORATADINE 10 MG PO TABS
10.0000 mg | ORAL_TABLET | Freq: Every day | ORAL | Status: DC
  Administered 2017-05-07 – 2017-05-09 (×3): 10 mg via ORAL
  Filled 2017-05-07 (×3): qty 1

## 2017-05-07 MED ORDER — ACETAMINOPHEN 325 MG PO TABS
650.0000 mg | ORAL_TABLET | Freq: Four times a day (QID) | ORAL | Status: DC | PRN
  Administered 2017-05-07 – 2017-05-08 (×2): 650 mg via ORAL
  Filled 2017-05-07 (×2): qty 2

## 2017-05-07 MED ORDER — SALINE SPRAY 0.65 % NA SOLN
1.0000 | NASAL | Status: DC | PRN
  Filled 2017-05-07: qty 44

## 2017-05-07 MED ORDER — LOSARTAN POTASSIUM 50 MG PO TABS
50.0000 mg | ORAL_TABLET | Freq: Every day | ORAL | Status: DC
  Administered 2017-05-07 – 2017-05-09 (×3): 50 mg via ORAL
  Filled 2017-05-07 (×3): qty 1

## 2017-05-07 NOTE — Progress Notes (Addendum)
Page to Dr. Eula Fried:  1610: 9U04: Kasel, Lenna Sciara. Tikosyn patient. aflutter rate controlled 60's. AM K:3.9. Supplement prior to 8am dose?   0345: return call from Dr. Eula Fried. Orders received: 51mEq k-dur, PO, 1 time dose, now. No recheck necessary prior to morning Tikosyn administration.    5409: Patient back in NSR.

## 2017-05-07 NOTE — Progress Notes (Signed)
Progress Note  Patient Name: Brent Wilkins Date of Encounter: 05/07/2017  Primary Cardiologist: [place] into sinus rhythm after the initial dose of tikosyn with some atrial flutter intermittently. Is in sinus rhythm now and feeling well without CP or SOB.  Inpatient Medications    Scheduled Meds: . apixaban  5 mg Oral BID  . dofetilide  500 mcg Oral BID  . finasteride  5 mg Oral Daily  . gabapentin  300 mg Oral BID  . Influenza vac split quadrivalent PF  0.5 mL Intramuscular Tomorrow-1000  . levothyroxine  75 mcg Oral QAC breakfast  . losartan  25 mg Oral Daily  . omega-3 acid ethyl esters  1,000 mg Oral Daily  . sodium chloride flush  3 mL Intravenous Q12H  . tamsulosin  0.4 mg Oral QPC breakfast  . vitamin B-12  1,000 mcg Oral Daily   Continuous Infusions: . sodium chloride     PRN Meds: sodium chloride, sodium chloride flush   Vital Signs    Vitals:   05/06/17 2025 05/06/17 2026 05/06/17 2331 05/07/17 0400  BP: (!) 182/85  (!) 169/91 (!) 160/67  Pulse:    64  Resp: 18 18    Temp: 98.7 F (37.1 C)   97.9 F (36.6 C)  TempSrc:      SpO2: 97% 97%  97%  Weight:      Height:        Intake/Output Summary (Last 24 hours) at 05/07/17 0746 Last data filed at 05/07/17 0100  Gross per 24 hour  Intake              580 ml  Output              700 ml  Net             -120 ml   Filed Weights   05/05/17 1728  Weight: 190 lb (86.2 kg)    Telemetry    Sinus rhythm - Personally Reviewed  ECG    Atrial flutter and sinus rhythm, RBBB - Personally Reviewed  Physical Exam   GEN: Well nourished, well developed, in no acute distress  HEENT: normal  Neck: no JVD, carotid bruits, or masses Cardiac: RRR; no murmurs, rubs, or gallops,no edema  Respiratory:  clear to auscultation bilaterally, normal work of breathing GI: soft, nontender, nondistended, + BS MS: no deformity or atrophy  Skin: warm and dry Neuro:  Strength and sensation are  intact Psych: euthymic mood, full affect   Labs    Chemistry  Recent Labs Lab 05/05/17 1907 05/06/17 0406 05/07/17 0242  NA 141 141 140  K 4.4 4.3 3.9  CL 107 107 106  CO2 27 26 26   GLUCOSE 130* 89 86  BUN 13 13 13   CREATININE 1.17 1.15 1.21  CALCIUM 9.0 9.4 8.9  PROT  --  6.0*  --   ALBUMIN  --  3.2*  --   AST  --  28  --   ALT  --  11*  --   ALKPHOS  --  63  --   BILITOT  --  0.9  --   GFRNONAA 58* 59* 56*  GFRAA >60 >60 >60  ANIONGAP 7 8 8      Hematology  Recent Labs Lab 05/06/17 0406  WBC 7.4  RBC 4.93  HGB 15.2  HCT 44.6  MCV 90.5  MCH 30.8  MCHC 34.1  RDW 13.7  PLT 204  Cardiac EnzymesNo results for input(s): TROPONINI in the last 168 hours. No results for input(s): TROPIPOC in the last 168 hours.   BNPNo results for input(s): BNP, PROBNP in the last 168 hours.   DDimer No results for input(s): DDIMER in the last 168 hours.   Radiology    No results found.  Cardiac Studies   TTE - Left ventricle: The cavity size was normal. Wall thickness was   increased in a pattern of moderate LVH. Systolic function was   normal. The estimated ejection fraction was in the range of 60%   to 65%. Wall motion was normal; there were no regional wall   motion abnormalities. Doppler parameters are consistent with   restrictive physiology, indicative of decreased left ventricular   diastolic compliance and/or increased left atrial pressure.   Doppler parameters are consistent with high ventricular filling   pressure. - Aortic valve: Moderately calcified annulus. Trileaflet. - Mitral valve: Mildly calcified annulus. - Left atrium: The atrium was severely dilated. - Right ventricle: Systolic function was mildly reduced. - Right atrium: The atrium was mildly to moderately dilated. - Tricuspid valve: There was mild-moderate regurgitation. - Pulmonary arteries: Incomplete TR jet to accurately estimated   PASP.  Patient Profile     76 y.o. male with a  episode of syncope and atrial fibrillaiton presents for Tikosyn initiation.  Assessment & Plan    Atrial fibrillation: Currently on Tikosyn 500 mcg. QTc stable. Has converted to sinus rhythm without post conversion pauses to explain prior syncope. Continue to monitor K and Mg. [place] replete K today.  Hypertension: Elevated today but has not yet received home BP meds. Brent Wilkins adjust losartan to 50 mg.  For questions or updates, please contact [place] Please consult [email or web address] for contact info under Cardiology/STEMI.      Signed, Brent Doster [name], MD  05/07/2017, 7:46 AM

## 2017-05-08 LAB — MAGNESIUM: MAGNESIUM: 2.1 mg/dL (ref 1.7–2.4)

## 2017-05-08 LAB — BASIC METABOLIC PANEL
ANION GAP: 8 (ref 5–15)
BUN: 13 mg/dL (ref 6–20)
CO2: 27 mmol/L (ref 22–32)
Calcium: 8.9 mg/dL (ref 8.9–10.3)
Chloride: 106 mmol/L (ref 101–111)
Creatinine, Ser: 1.12 mg/dL (ref 0.61–1.24)
GFR calc Af Amer: >60 mL/min (ref >60)
GFR calc non Af Amer: >60 mL/min (ref >60)
GLUCOSE: 82 mg/dL (ref 65–99)
POTASSIUM: 3.9 mmol/L (ref 3.5–5.1)
Sodium: 141 mmol/L (ref 135–145)

## 2017-05-08 NOTE — Discharge Summary (Signed)
ELECTROPHYSIOLOGY PROCEDURE DISCHARGE SUMMARY    Patient ID: Brent Wilkins,  MRN: [id number], DOB/AGE: [id number] y.o.  Admit date: 05/05/2017 Discharge date: 05/09/2017  Primary Care Physician: Redmond School, MD Primary Cardiologist: Harrington Challenger Electrophysiologist: [facility]  Primary Discharge Diagnosis:  1.  Paroxysmal atrial fibrillation status post Tikosyn loading this admission  Secondary Discharge Diagnosis:  1.  HTN 2.  Hypothyroidism 3.  Syncope 4.  GERD  Allergies  Allergen Reactions  . Oxycodone Other (See Comments)    Severe constipation  . Statins Other (See Comments)    Hard to walk, severe leg cramps  . Tetanus Toxoids Swelling  . Doxycycline Rash  . Penicillins Swelling and Rash    .Has patient had a PCN reaction causing immediate rash, facial/tongue/throat swelling, SOB or lightheadedness with hypotension: Yes Has patient had a PCN reaction causing severe rash involving mucus membranes or skin necrosis: No Has patient had a PCN reaction that required hospitalization: Yes Has patient had a PCN reaction occurring within the last 10 years: No If all of the above answers are "NO", then may proceed with Cephalosporin use.   . Sulfa Antibiotics Swelling and Rash     Procedures This Admission:  1.  Tikosyn loading  Brief HPI: Brent Wilkins is a 76 y.o. male with a past medical history as noted above.  They were referred to EP in the outpatient setting for treatment options of atrial fibrillation.  Risks, benefits, and alternatives to Tikosyn were reviewed with the patient who wished to proceed.    Hospital Course:  The patient was admitted and Tikosyn was initiated.  Renal function and electrolytes were followed during the hospitalization.  Their QTc remained stable. They were monitored until discharge on telemetry which demonstrated sinus rhythm with intermittent AF/flutter.  On the day of discharge, they were examined by Dr Rayann Heman who considered them  stable for discharge to home.  Follow-up has been arranged with AF clinic in 1 week and with Dr Curt Bears in 4 weeks.   Physical Exam: Vitals:   05/08/17 0527 05/08/17 1454 05/08/17 2000 05/09/17 0600  BP: (!) 158/81 139/65 (!) 150/76 140/87  Pulse: (!) 57  64   Resp:   17   Temp: 97.6 F (36.4 C) 98.1 F (36.7 C) (!) 97.5 F (36.4 C)   TempSrc: Oral Oral Axillary   SpO2: 98% 98% 100%   Weight: 190 lb 11.2 oz (86.5 kg)     Height:        GEN- The patient is well appearing, alert and oriented x 3 today.   HEENT: normocephalic, atraumatic; sclera clear, conjunctiva pink; hearing intact; oropharynx clear; neck supple  Lungs- Clear to ausculation bilaterally, normal work of breathing.  No wheezes, rales, rhonchi Heart- Irregular rate and rhythm  GI- soft, non-tender, non-distended, bowel sounds present  Extremities- no clubbing, cyanosis, or edema  MS- no significant deformity or atrophy Skin- warm and dry, no rash or lesion Psych- euthymic mood, full affect Neuro- strength and sensation are intact   Labs:   Lab Results  Component Value Date   WBC 7.4 05/06/2017   HGB 15.2 05/06/2017   HCT 44.6 05/06/2017   MCV 90.5 05/06/2017   PLT 204 05/06/2017    Recent Labs Lab 05/06/17 0406  05/09/17 0412  NA 141  < > 141  K 4.3  < > 4.2  CL 107  < > 106  CO2 26  < > 26  BUN 13  < > 16  CREATININE 1.15  < > 1.15  CALCIUM 9.4  < > 9.3  PROT 6.0*  --   --   BILITOT 0.9  --   --   ALKPHOS 63  --   --   ALT 11*  --   --   AST 28  --   --   GLUCOSE 89  < > 98  < > = values in this interval not displayed.   Discharge Medications:  Allergies as of 05/09/2017      Reactions   Oxycodone Other (See Comments)   Severe constipation   Statins Other (See Comments)   Hard to walk, severe leg cramps   Tetanus Toxoids Swelling   Doxycycline Rash   Penicillins Swelling, Rash   .Has patient had a PCN reaction causing immediate rash, facial/tongue/throat swelling, SOB or  lightheadedness with hypotension: Yes Has patient had a PCN reaction causing severe rash involving mucus membranes or skin necrosis: No Has patient had a PCN reaction that required hospitalization: Yes Has patient had a PCN reaction occurring within the last 10 years: No If all of the above answers are "NO", then may proceed with Cephalosporin use.   Sulfa Antibiotics Swelling, Rash      Medication List    TAKE these medications   apixaban 5 MG Tabs tablet Commonly known as:  ELIQUIS Take 1 tablet (5 mg total) by mouth 2 (two) times daily.   dofetilide 500 MCG capsule Commonly known as:  TIKOSYN Take 1 capsule (500 mcg total) by mouth 2 (two) times daily.   finasteride 5 MG tablet Commonly known as:  PROSCAR Take 5 mg by mouth daily.   fluticasone 50 MCG/ACT nasal spray Commonly known as:  FLONASE Place 1 spray into both nostrils daily as needed for allergies.   gabapentin 300 MG capsule Commonly known as:  NEURONTIN Take 300 mg by mouth 2 (two) times daily.   levothyroxine 75 MCG tablet Commonly known as:  SYNTHROID, LEVOTHROID Take 75 mcg by mouth daily.   losartan 25 MG tablet Commonly known as:  COZAAR Take 1 tablet (25 mg total) by mouth daily.   RA FISH OIL 1400 MG Cpdr Take 1,400 mg by mouth daily.   SYSTANE 0.4-0.3 % Soln Generic drug:  Polyethyl Glycol-Propyl Glycol Place 1-2 drops into both eyes 2 (two) times daily as needed (dry).   tamsulosin 0.4 MG Caps capsule Commonly known as:  FLOMAX Take 0.4 mg by mouth daily after breakfast.   vitamin B-12 500 MCG tablet Commonly known as:  CYANOCOBALAMIN Take 1,000 mcg by mouth daily.       Disposition:  Discharge Instructions    Diet - low sodium heart healthy    Complete by:  As directed    Increase activity slowly    Complete by:  As directed      Follow-up Information    [facility] Follow up on 05/15/2017.   Specialty:  Cardiology Why:  at 10:30AM Contact  information: [address] [id number] [place] [address] [phone]       Constance Haw, MD Follow up on 06/10/2017.   Specialty:  Cardiology Why:  at 8:15AM Contact information: [place] [address] [phone]           Duration of Discharge Encounter: Greater than 30 minutes including physician time.  Signed, Chanetta Marshall, NP 05/09/2017 7:53 AM

## 2017-05-08 NOTE — Plan of Care (Signed)
Problem: Safety: Goal: Ability to remain free from injury will improve Outcome: Progressing Patient educated on room safety and hospital routine. Patient a&ox4. Call light within reach.uses appropriatly. NSR/SB on tele. QTc: 473 on EKG post 4th dose of tikosyn.

## 2017-05-08 NOTE — Progress Notes (Signed)
Electrophysiology Rounding Note  Patient Name: Brent Wilkins Date of Encounter: 05/08/2017  Electrophysiologist: Curt Bears   Subjective   The patient is doing well today.  At this time, the patient denies chest pain, shortness of breath, or any new concerns.  Inpatient Medications    Scheduled Meds: . apixaban  5 mg Oral BID  . dofetilide  500 mcg Oral BID  . finasteride  5 mg Oral Daily  . gabapentin  300 mg Oral BID  . levothyroxine  75 mcg Oral QAC breakfast  . loratadine  10 mg Oral Daily  . losartan  50 mg Oral Daily  . omega-3 acid ethyl esters  1,000 mg Oral Daily  . pantoprazole  40 mg Oral BID  . sodium chloride flush  3 mL Intravenous Q12H  . tamsulosin  0.4 mg Oral QPC breakfast  . vitamin B-12  1,000 mcg Oral Daily   Continuous Infusions: . sodium chloride     PRN Meds: sodium chloride, acetaminophen, sodium chloride, sodium chloride flush   Vital Signs    Vitals:   05/07/17 0400 05/07/17 1500 05/07/17 2032 05/08/17 0527  BP: (!) 160/67  (!) 176/85 (!) 158/81  Pulse: 64   (!) 57  Resp:  20    Temp: 97.9 F (36.6 C) 98.2 F (36.8 C) 97.8 F (36.6 C) 97.6 F (36.4 C)  TempSrc:  Oral Oral Oral  SpO2: 97% 99% 99% 98%  Weight:    190 lb 11.2 oz (86.5 kg)  Height:        Intake/Output Summary (Last 24 hours) at 05/08/17 0813 Last data filed at 05/07/17 2100  Gross per 24 hour  Intake             1200 ml  Output                0 ml  Net             1200 ml   Filed Weights   05/05/17 1728 05/08/17 0527  Weight: 190 lb (86.2 kg) 190 lb 11.2 oz (86.5 kg)    Physical Exam    GEN- The patient is well appearing, alert and oriented x 3 today.   Head- normocephalic, atraumatic Eyes-  Sclera clear, conjunctiva pink Ears- hearing intact Oropharynx- clear Neck- supple Lungs- Clear to ausculation bilaterally, normal work of breathing Heart- Regular rate and rhythm, no murmurs, rubs or gallops GI- soft, NT, ND, + BS Extremities- no clubbing,  cyanosis, or edema Skin- no rash or lesion Psych- euthymic mood, full affect Neuro- strength and sensation are intact  Labs    CBC  Recent Labs  05/06/17 0406  WBC 7.4  HGB 15.2  HCT 44.6  MCV 90.5  PLT 643   Basic Metabolic Panel  Recent Labs  05/07/17 0242 05/08/17 0457  NA 140 141  K 3.9 3.9  CL 106 106  CO2 26 27  GLUCOSE 86 82  BUN 13 13  CREATININE 1.21 1.12  CALCIUM 8.9 8.9  MG 2.0 2.1   Liver Function Tests  Recent Labs  05/06/17 0406  AST 28  ALT 11*  ALKPHOS 63  BILITOT 0.9  PROT 6.0*  ALBUMIN 3.2*     Telemetry    Sinus rhythm predominately (personally reviewed)  Radiology    No results found.   Patient Profile     Brent Wilkins is a 76 y.o. male admitted for Tikosyn load   Assessment & Plan    1.  Persistent atrial fibrillation Maintaining SR on Tikosyn BMET, Mg, QTc stable Continue anticoagulation long term for CHADS2VASC of 3 Plan discharge tomorrow  2.  HTN Zaelyn Noack follow Slightly elevated today  Plan discharge tomorrow morning.    Signed, Chanetta Marshall, NP  05/08/2017, 8:13 AM   I have seen and examined this patient with Chanetta Marshall.  Agree with above, note added to reflect my findings.  On exam, RRR, no murmurs, lungs clear. We Elena Davia today without major complaint. Was walking in the halls without issue. This continued on Tikosyn 500 g a day. QTC has stayed less than 500 ms with consistent right bundle-branch block. He has had a few episodes of atrial flutter throughout the day, though mostly in sinus rhythm. Continue with current management.    Johnmatthew Solorio M. Flecia Shutter MD 05/08/2017 8:36 AM

## 2017-05-09 DIAGNOSIS — Z23 Encounter for immunization: Secondary | ICD-10-CM | POA: Diagnosis not present

## 2017-05-09 LAB — BASIC METABOLIC PANEL
Anion gap: 9 (ref 5–15)
BUN: 16 mg/dL (ref 6–20)
CALCIUM: 9.3 mg/dL (ref 8.9–10.3)
CHLORIDE: 106 mmol/L (ref 101–111)
CO2: 26 mmol/L (ref 22–32)
CREATININE: 1.15 mg/dL (ref 0.61–1.24)
GFR calc Af Amer: >60 mL/min (ref >60)
GFR calc non Af Amer: 59 mL/min — ABNORMAL LOW (ref >60)
Glucose, Bld: 98 mg/dL (ref 65–99)
Potassium: 4.2 mmol/L (ref 3.5–5.1)
SODIUM: 141 mmol/L (ref 135–145)

## 2017-05-09 LAB — MAGNESIUM: MAGNESIUM: 2.1 mg/dL (ref 1.7–2.4)

## 2017-05-09 MED ORDER — DOFETILIDE 500 MCG PO CAPS: 500.0000 ug | ORAL_CAPSULE | Freq: Two times a day (BID) | ORAL | 1 refills | Status: DC

## 2017-05-09 NOTE — Progress Notes (Signed)
Pt is currently in afib/aflutter rate 60-90. Will continue to monitor.

## 2017-05-09 NOTE — Progress Notes (Signed)
Tikosyn 500mg  1 tablet BID #14 for 7 day supply dispensed to patient for discharge from [place].  Georga Bora, PharmD Clinical Pharmacist 05/09/2017 9:55 AM

## 2017-05-09 NOTE — Care Management Important Message (Signed)
Important Message  Patient Details  Name: Brent Wilkins MRN: [id number] Date of Birth: [birth year]   Medicare Important Message Given:  Yes    Diann Bangerter Abena 05/09/2017, 10:04 AM

## 2017-05-15 ENCOUNTER — Encounter (HOSPITAL_COMMUNITY): Payer: Self-pay | Admitting: Nurse Practitioner

## 2017-05-15 ENCOUNTER — Ambulatory Visit (HOSPITAL_COMMUNITY)
Admit: 2017-05-15 | Discharge: 2017-05-15 | Disposition: A | Discharge status: Home / Self Care | Payer: Medicare Other | Attending: Nurse Practitioner | Admitting: Nurse Practitioner

## 2017-05-15 VITALS — BP 136/82 | HR 67 | Ht 70.0 in | Wt 197.2 lb

## 2017-05-15 DIAGNOSIS — E039 Hypothyroidism, unspecified: Secondary | ICD-10-CM | POA: Insufficient documentation

## 2017-05-15 DIAGNOSIS — Z8546 Personal history of malignant neoplasm of prostate: Secondary | ICD-10-CM | POA: Insufficient documentation

## 2017-05-15 DIAGNOSIS — Z9049 Acquired absence of other specified parts of digestive tract: Secondary | ICD-10-CM | POA: Diagnosis not present

## 2017-05-15 DIAGNOSIS — K219 Gastro-esophageal reflux disease without esophagitis: Secondary | ICD-10-CM | POA: Insufficient documentation

## 2017-05-15 DIAGNOSIS — I4510 Unspecified right bundle-branch block: Secondary | ICD-10-CM | POA: Diagnosis not present

## 2017-05-15 DIAGNOSIS — Z7901 Long term (current) use of anticoagulants: Secondary | ICD-10-CM | POA: Diagnosis not present

## 2017-05-15 DIAGNOSIS — K449 Diaphragmatic hernia without obstruction or gangrene: Secondary | ICD-10-CM | POA: Insufficient documentation

## 2017-05-15 DIAGNOSIS — G629 Polyneuropathy, unspecified: Secondary | ICD-10-CM | POA: Diagnosis not present

## 2017-05-15 DIAGNOSIS — I481 Persistent atrial fibrillation: Secondary | ICD-10-CM

## 2017-05-15 DIAGNOSIS — I4819 Other persistent atrial fibrillation: Secondary | ICD-10-CM

## 2017-05-15 DIAGNOSIS — Z79899 Other long term (current) drug therapy: Secondary | ICD-10-CM | POA: Diagnosis not present

## 2017-05-15 DIAGNOSIS — I10 Essential (primary) hypertension: Secondary | ICD-10-CM | POA: Diagnosis not present

## 2017-05-15 DIAGNOSIS — Z880 Allergy status to penicillin: Secondary | ICD-10-CM | POA: Diagnosis not present

## 2017-05-15 DIAGNOSIS — Z87442 Personal history of urinary calculi: Secondary | ICD-10-CM | POA: Insufficient documentation

## 2017-05-15 DIAGNOSIS — I440 Atrioventricular block, first degree: Secondary | ICD-10-CM | POA: Diagnosis not present

## 2017-05-15 DIAGNOSIS — Z9842 Cataract extraction status, left eye: Secondary | ICD-10-CM | POA: Insufficient documentation

## 2017-05-15 LAB — BASIC METABOLIC PANEL
Anion gap: 7 (ref 5–15)
BUN: 12 mg/dL (ref 6–20)
CHLORIDE: 109 mmol/L (ref 101–111)
CO2: 25 mmol/L (ref 22–32)
CREATININE: 1.09 mg/dL (ref 0.61–1.24)
Calcium: 9.4 mg/dL (ref 8.9–10.3)
GFR calc Af Amer: >60 mL/min (ref >60)
GFR calc non Af Amer: >60 mL/min (ref >60)
Glucose, Bld: 119 mg/dL — ABNORMAL HIGH (ref 65–99)
POTASSIUM: 4.1 mmol/L (ref 3.5–5.1)
Sodium: 141 mmol/L (ref 135–145)

## 2017-05-15 LAB — MAGNESIUM: Magnesium: 2 mg/dL (ref 1.7–2.4)

## 2017-05-15 NOTE — Progress Notes (Signed)
Primary Care Physician: Redmond School, MD Referring Physician: Dr. Curt Bears Cardiologist: Dr. Jacklynn Bue Brent Wilkins is a 76 y.o. male with a h/o HTN, persistent atrial fibrillation, that presented to the afib clinic, 10/1 for admission for tikosyn. He has a syncopal episode in April, bradycardia and not on any AV nodal blockers. He was aware of the cost of the drug, no recent benadryl use, no missed doses of eliquis 5 mg bid.  F/u 10/11, pt returns to afib clinic s/p hospitalization  one week for Tikosyn f/u. QTc/labs remained stable. He reports that he feels so much better, more energy. Qtc is stable.He has drug on hand, know not to miss doses  Today, he denies symptoms of palpitations, chest pain, shortness of breath, orthopnea, PND, lower extremity edema, dizziness, presyncope, syncope, or neurologic sequela. The patient is tolerating medications without difficulties and is otherwise without complaint today.   Past Medical History:  Diagnosis Date  . Arthritis   . Dysphagia   . Frequent urination at night   . GERD (gastroesophageal reflux disease)   . History of kidney stones   . HTN (hypertension)   . Hypothyroidism   . 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 Neuropathy    Bilateral ankles  . Prostate cancer [facility]) 2010   Dr. Rosana Hoes  . Seasonal allergies   . Syncope  dx. 8 yrs ago   Past Surgical History:  Procedure Laterality Date  . APPENDECTOMY    . CATARACT EXTRACTION W/PHACO Left 03/27/2015   Procedure: CATARACT EXTRACTION PHACO AND INTRAOCULAR LENS PLACEMENT LEFT EYE CDE=11.73;  Surgeon: Tonny Branch, MD;  Location: [facility];  Service: Ophthalmology;  Laterality: Left;  . CATARACT EXTRACTION W/PHACO Right 03/30/2015   Procedure: CATARACT EXTRACTION PHACO AND INTRAOCULAR LENS PLACEMENT RIGHT EYE CDE=7.35;  Surgeon: Tonny Branch, MD;  Location: [facility];  Service: Ophthalmology;  Laterality: Right;  . CHOLECYSTECTOMY    . COLONOSCOPY  07/23/10   Dr. Vivi Ferns rectum, scattered  pancolonic diverticula  . COLONOSCOPY  08/10/1999   internal hemorrhoids,inflammatory polyp  . ESOPHAGOGASTRODUODENOSCOPY  04/12/08   Prominent Schatzki's ring, erosive reflux esophagitis, multiple antral erosions, small hiatal hernia, reactive gastropathy, status post dilation with 74F  . ESOPHAGOGASTRODUODENOSCOPY  10/28/2011   Procedure: ESOPHAGOGASTRODUODENOSCOPY (EGD);  Surgeon: Daneil Dolin, MD;  Location: [facility];  Service: Endoscopy;  Laterality: N/A;  1:30  . MALONEY DILATION  10/28/2011   Procedure: Venia Minks DILATION;  Surgeon: Daneil Dolin, MD;  Location: [facility];  Service: Endoscopy;  Laterality: N/A;  . SAVORY DILATION  10/28/2011   Procedure: SAVORY DILATION;  Surgeon: Daneil Dolin, MD;  Location: [facility];  Service: Endoscopy;  Laterality: N/A;  . TONSILLECTOMY    . TOTAL ANKLE ARTHROPLASTY Left 11/25/2013   Procedure: TOTAL ANKLE ARTHOPLASTY;  Surgeon: Wylene Simmer, MD;  Location: [place];  Service: Orthopedics;  Laterality: Left;  . TOTAL ANKLE ARTHROPLASTY Right 07/14/2014   dr hewitt  . TOTAL ANKLE ARTHROPLASTY Right 07/14/2014   Procedure: RIGHT TOTAL ANKLE ARTHOPLASTY;  Surgeon: Wylene Simmer, MD;  Location: [place];  Service: Orthopedics;  Laterality: Right;    Current Outpatient Prescriptions  Medication Sig Dispense Refill  . apixaban (ELIQUIS) 5 MG TABS tablet Take 1 tablet (5 mg total) by mouth 2 (two) times daily. 180 tablet 3  . dofetilide (TIKOSYN) 500 MCG capsule Take 1 capsule (500 mcg total) by mouth 2 (two) times daily. 180 capsule 1  . finasteride (PROSCAR) 5  MG tablet Take 5 mg by mouth daily.    . fluticasone (FLONASE) 50 MCG/ACT nasal spray Place 1 spray into both nostrils daily as needed for allergies.     Marland Kitchen gabapentin (NEURONTIN) 300 MG capsule Take 300 mg by mouth 2 (two) times daily.     Marland Kitchen levothyroxine (SYNTHROID, LEVOTHROID) 75 MCG tablet Take 75 mcg by mouth daily.  0  . losartan (COZAAR) 25 MG tablet Take 1 tablet (25 mg total) by mouth  daily. 90 tablet 3  . Omega-3 Fatty Acids (RA FISH OIL) 1400 MG CPDR Take 1,400 mg by mouth daily.    Vladimir Faster Glycol-Propyl Glycol (SYSTANE) 0.4-0.3 % SOLN Place 1-2 drops into both eyes 2 (two) times daily as needed (dry).     . Tamsulosin HCl (FLOMAX) 0.4 MG CAPS Take 0.4 mg by mouth daily after breakfast.     . vitamin B-12 (CYANOCOBALAMIN) 500 MCG tablet Take 1,000 mcg by mouth daily.      No current facility-administered medications for this encounter.     Allergies  Allergen Reactions  . Oxycodone Other (See Comments)    Severe constipation  . Statins Other (See Comments)    Hard to walk, severe leg cramps  . Tetanus Toxoids Swelling  . Doxycycline Rash  . Penicillins Swelling and Rash    .Has patient had a PCN reaction causing immediate rash, facial/tongue/throat swelling, SOB or lightheadedness with hypotension: Yes Has patient had a PCN reaction causing severe rash involving mucus membranes or skin necrosis: No Has patient had a PCN reaction that required hospitalization: Yes Has patient had a PCN reaction occurring within the last 10 years: No If all of the above answers are "NO", then may proceed with Cephalosporin use.   . Sulfa Antibiotics Swelling and Rash    Social History   Social History  . Marital status: Married    Spouse name: N/A  . Number of children: 2  . Years of education: N/A   Occupational History  . retired; Nurse, learning disability Retired   Social History Main Topics  . Smoking status: Never Smoker  . Smokeless tobacco: Never Used     Comment: doesnt smoke  . Alcohol use 0.6 - 1.2 oz/week    1 - 2 Cans of beer per week     Comment: 2 beers a week  . Drug use: No  . Sexual activity: Not on file   Other Topics Concern  . Not on file   Social History Narrative  . No narrative on file    Family History  Problem Relation Age of Onset  . Prostate cancer Father   . Prostate cancer Son 68  . Prostate cancer Brother   . Colon cancer Neg  Hx     ROS- All systems are reviewed and negative except as per the HPI above  Physical Exam: Vitals:   05/15/17 1028  BP: 136/82  Pulse: 67  Weight: 197 lb 3.2 oz (89.4 kg)  Height: 5\' 10"  (1.778 m)   Wt Readings from Last 3 Encounters:  05/15/17 197 lb 3.2 oz (89.4 kg)  05/08/17 190 lb 11.2 oz (86.5 kg)  05/05/17 195 lb 12.8 oz (88.8 kg)    Labs: Lab Results  Component Value Date   NA 141 05/09/2017   K 4.2 05/09/2017   CL 106 05/09/2017   CO2 26 05/09/2017   GLUCOSE 98 05/09/2017   BUN 16 05/09/2017   CREATININE 1.15 05/09/2017   CALCIUM 9.3 05/09/2017  MG 2.1 05/09/2017   No results found for: INR Lab Results  Component Value Date   CHOL 141 11/14/2016   HDL 50 11/14/2016   LDLCALC 74 11/14/2016   TRIG 85 11/14/2016     GEN- The patient is well appearing, alert and oriented x 3 today.   Head- normocephalic, atraumatic Eyes-  Sclera clear, conjunctiva pink Ears- hearing intact Oropharynx- clear Neck- supple, no JVP Lymph- no cervical lymphadenopathy Lungs- Clear to ausculation bilaterally, normal work of breathing Heart- regular rate and rhythm, no murmurs, rubs or gallops, PMI not laterally displaced GI- soft, NT, ND, + BS Extremities- no clubbing, cyanosis, or edema MS- no significant deformity or atrophy Skin- no rash or lesion Psych- euthymic mood, full affect Neuro- strength and sensation are intact  EKG- NSR at 67 bpm, RBBB, pr itn 208 ms, qrs int 148 ms, qtc 481 ms (stable) Echo-Study Conclusions  - Left ventricle: The cavity size was normal. Wall thickness was   increased in a pattern of moderate LVH. Systolic function was   normal. The estimated ejection fraction was in the range of 60%   to 65%. Wall motion was normal; there were no regional wall   motion abnormalities. Doppler parameters are consistent with   restrictive physiology, indicative of decreased left ventricular   diastolic compliance and/or increased left atrial  pressure.   Doppler parameters are consistent with high ventricular filling   pressure. - Aortic valve: Moderately calcified annulus. Trileaflet. - Mitral valve: Mildly calcified annulus. - Left atrium: The atrium was severely dilated. - Right ventricle: Systolic function was mildly reduced. - Right atrium: The atrium was mildly to moderately dilated. - Tricuspid valve: There was mild-moderate regurgitation. - Pulmonary arteries: Incomplete TR jet to accurately estimated   PASP.    Assessment and Plan: 1. Persistent afib Return to SR with Tikosyn loading with improvement of symptoms Continue Tikosyn 500 mg bid General precautions re tikosyn reviewed  Continue eliquis without missed doses, chadsvasc score of at least 3 Bmet/mag today  F/u with Dr. Harrington Challenger 10/29 Dr. Curt Bears 11/6 afib clinc as needed  Geroge Baseman. Mila [facility] [address] [place], [place] [address] [place] Shanetta Nicolls, [facility] [address] [place], [place] [address] [phone]

## 2017-05-15 NOTE — Addendum Note (Signed)
Encounter addended by: Juluis Mire, RN on: 05/15/2017 12:19 PM<BR>    Actions taken: Order list changed

## 2017-05-20 DIAGNOSIS — X32XXXD Exposure to sunlight, subsequent encounter: Secondary | ICD-10-CM | POA: Diagnosis not present

## 2017-05-20 DIAGNOSIS — L570 Actinic keratosis: Secondary | ICD-10-CM | POA: Diagnosis not present

## 2017-05-20 DIAGNOSIS — Z1283 Encounter for screening for malignant neoplasm of skin: Secondary | ICD-10-CM | POA: Diagnosis not present

## 2017-05-23 DIAGNOSIS — E063 Autoimmune thyroiditis: Secondary | ICD-10-CM | POA: Diagnosis not present

## 2017-05-23 DIAGNOSIS — I10 Essential (primary) hypertension: Secondary | ICD-10-CM | POA: Diagnosis not present

## 2017-06-02 ENCOUNTER — Ambulatory Visit: Payer: Medicare Other | Admitting: Internal Medicine

## 2017-06-04 ENCOUNTER — Telehealth (HOSPITAL_COMMUNITY): Payer: Self-pay | Admitting: *Deleted

## 2017-06-04 NOTE — Telephone Encounter (Signed)
Patient approved for tikosyn patient assistance from 05/30/17-08/04/2017. Patient ID #[id number].

## 2017-06-10 ENCOUNTER — Ambulatory Visit: Payer: Medicare Other | Admitting: Cardiology

## 2017-06-10 ENCOUNTER — Ambulatory Visit (INDEPENDENT_AMBULATORY_CARE_PROVIDER_SITE_OTHER): Payer: Medicare Other | Admitting: Cardiology

## 2017-06-10 ENCOUNTER — Encounter: Payer: Self-pay | Admitting: Cardiology

## 2017-06-10 VITALS — BP 140/68 | HR 78 | Ht 70.0 in | Wt 193.4 lb

## 2017-06-10 DIAGNOSIS — I4819 Other persistent atrial fibrillation: Secondary | ICD-10-CM

## 2017-06-10 DIAGNOSIS — I481 Persistent atrial fibrillation: Secondary | ICD-10-CM | POA: Diagnosis not present

## 2017-06-10 DIAGNOSIS — R55 Syncope and collapse: Secondary | ICD-10-CM | POA: Diagnosis not present

## 2017-06-10 DIAGNOSIS — I10 Essential (primary) hypertension: Secondary | ICD-10-CM | POA: Diagnosis not present

## 2017-06-10 MED ORDER — PROPRANOLOL HCL 10 MG PO TABS: 10.0000 mg | ORAL_TABLET | Freq: Every day | ORAL | 3 refills | Status: DC

## 2017-06-10 NOTE — Patient Instructions (Addendum)
Medication Instructions:  Your physician has recommended you make the following change in your medication:  1.) Restart Propranolol 10 mg daily    -- If you need a refill on your cardiac medications before your next appointment, please call your pharmacy. --  Labwork: None ordered  Testing/Procedures: None ordered  Follow-Up: Your physician wants you to follow-up in: 6 months with Dr. Curt Bears. You will receive a reminder letter in the mail two months in advance. If you don't receive a letter, please call our office to schedule the follow-up appointment.  Thank you for choosing [facility]!!   Frederik Schmidt, RN [phone]  Any Other Special Instructions Will Be Listed Below (If Applicable).

## 2017-06-10 NOTE — Progress Notes (Signed)
Electrophysiology Office Note   Date:  06/10/2017   ID:  Brent Wilkins April 01, 1941, MRN [id number]  PCP:  Redmond School, MD  Cardiologist:  Harrington Challenger Primary Electrophysiologist:  Burdette Gergely [name], MD    Chief Complaint  Patient presents with  . Follow-up    Persistent Afib     History of Present Illness: Brent Wilkins is a 76 y.o. male who is being seen today for the evaluation of atrial fibrillation at the request of Redmond School, MD. Presenting today for electrophysiology evaluation. He has a history of hypertension, paroxysmal atrial fibrillation, and hyperlipidemia. He was seen in the hospital total at the beginning of April for syncope. The patient was bradycardic and thus not on any AV nodal blockers. At the time of hospitalization, he was found to be orthostatic.  Due to his atrial fibrillation, he was admitted to the hospital for [place] and administration.  He tolerated the dose well and was discharged on 500 mcg.   Today, denies symptoms of palpitations, chest pain, shortness of breath, orthopnea, PND, lower extremity edema, claudication, dizziness, presyncope, syncope, bleeding, or neurologic sequela. The patient is tolerating medications without difficulties.  He is feeling well today.  He feels much better now that he is back in sinus rhythm.  He is having much less fatigue, weakness, and shortness of breath.  He did not realize that he was having as many symptoms prior to returning to normal rhythm.   Past Medical History:  Diagnosis Date  . Arthritis   . Dysphagia   . Frequent urination at night   . GERD (gastroesophageal reflux disease)   . History of kidney stones   . HTN (hypertension)   . Hypothyroidism   . 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 Neuropathy    Bilateral ankles  . Prostate cancer [facility]) 2010   Dr. Rosana Hoes  . Seasonal allergies   . Syncope  dx. 8 yrs ago   Past Surgical History:  Procedure Laterality Date  . APPENDECTOMY    .  CHOLECYSTECTOMY    . COLONOSCOPY  07/23/10   Dr. Vivi Ferns rectum, scattered pancolonic diverticula  . COLONOSCOPY  08/10/1999   internal hemorrhoids,inflammatory polyp  . ESOPHAGOGASTRODUODENOSCOPY  04/12/08   Prominent Schatzki's ring, erosive reflux esophagitis, multiple antral erosions, small hiatal hernia, reactive gastropathy, status post dilation with 70F  . TONSILLECTOMY    . TOTAL ANKLE ARTHROPLASTY Right 07/14/2014   dr hewitt     Current Outpatient Medications  Medication Sig Dispense Refill  . apixaban (ELIQUIS) 5 MG TABS tablet Take 1 tablet (5 mg total) by mouth 2 (two) times daily. 180 tablet 3  . dofetilide (TIKOSYN) 500 MCG capsule Take 1 capsule (500 mcg total) by mouth 2 (two) times daily. 180 capsule 1  . finasteride (PROSCAR) 5 MG tablet Take 5 mg by mouth daily.    . fluticasone (FLONASE) 50 MCG/ACT nasal spray Place 1 spray into both nostrils daily as needed for allergies.     Marland Kitchen gabapentin (NEURONTIN) 300 MG capsule Take 300 mg by mouth 2 (two) times daily.     Marland Kitchen levothyroxine (SYNTHROID, LEVOTHROID) 75 MCG tablet Take 75 mcg by mouth daily.  0  . losartan (COZAAR) 25 MG tablet Take 1 tablet (25 mg total) by mouth daily. 90 tablet 3  . Omega-3 Fatty Acids (RA FISH OIL) 1400 MG CPDR Take 1,400 mg by mouth daily.    Brent Wilkins Glycol-Propyl Glycol (SYSTANE) 0.4-0.3 % SOLN Place 1-2  drops into both eyes 2 (two) times daily as needed (dry).     . Tamsulosin HCl (FLOMAX) 0.4 MG CAPS Take 0.4 mg by mouth daily after breakfast.     . vitamin B-12 (CYANOCOBALAMIN) 500 MCG tablet Take 1,000 mcg by mouth daily.      No current facility-administered medications for this visit.     Allergies:   Oxycodone; Statins; Tetanus toxoids; Doxycycline; Penicillins; and Sulfa antibiotics   Social History:  The patient  reports that  has never smoked. he has never used smokeless tobacco. He reports that he drinks about 0.6 - 1.2 oz of alcohol per week. He reports that he does not use  drugs.   Family History:  The patient's family history includes Prostate cancer in his brother and father; Prostate cancer (age of onset: 58) in his son.    ROS:  Please see the history of present illness.   Otherwise, review of systems is positive for cough, nasal congestion.   All other systems are reviewed and negative.   PHYSICAL EXAM: VS:  BP 140/68   Pulse 78   Ht 5\' 10"  (1.778 m)   Wt 193 lb 6.4 oz (87.7 kg)   BMI 27.75 kg/m  , BMI Body mass index is 27.75 kg/m. GEN: Well nourished, well developed, in no acute distress  HEENT: normal  Neck: no JVD, carotid bruits, or masses Cardiac: RRR; no murmurs, rubs, or gallops,no edema  Respiratory:  clear to auscultation bilaterally, normal work of breathing GI: soft, nontender, nondistended, + BS MS: no deformity or atrophy  Skin: warm and dry Neuro:  Strength and sensation are intact Psych: euthymic mood, full affect  EKG:  EKG is ordered today. Personal review of the ekg ordered shows sinus rhythm, RBBB, rate 78   Recent Labs: 03/20/2017: TSH 2.125 05/06/2017: ALT 11; Hemoglobin 15.2; Platelets 204 05/15/2017: BUN 12; Creatinine, Ser 1.09; Magnesium 2.0; Potassium 4.1; Sodium 141    Lipid Panel     Component Value Date/Time   CHOL 141 11/14/2016 0928   TRIG 85 11/14/2016 0928   HDL 50 11/14/2016 0928   CHOLHDL 2.8 11/14/2016 0928   VLDL 17 11/14/2016 0928   LDLCALC 74 11/14/2016 0928     Wt Readings from Last 3 Encounters:  06/10/17 193 lb 6.4 oz (87.7 kg)  05/15/17 197 lb 3.2 oz (89.4 kg)  05/08/17 190 lb 11.2 oz (86.5 kg)      Other studies Reviewed: Additional studies/ records that were reviewed today include: 30 day monitor 04/28/17 - personally reviewed  Review of the above records today demonstrates:   Atrial fibrillation and flutter seen throughout study.  Multiple pauses up to 3 seconds with most occurring during early morning hours (presumably asleep) and one occurring at approximately 1 pm.  TTE  03/21/17 - Left ventricle: The cavity size was normal. Wall thickness was   increased in a pattern of moderate LVH. Systolic function was   normal. The estimated ejection fraction was in the range of 60%   to 65%. Wall motion was normal; there were no regional wall   motion abnormalities. Doppler parameters are consistent with   restrictive physiology, indicative of decreased left ventricular   diastolic compliance and/or increased left atrial pressure.   Doppler parameters are consistent with high ventricular filling   pressure. - Aortic valve: Moderately calcified annulus. Trileaflet. - Mitral valve: Mildly calcified annulus. - Left atrium: The atrium was severely dilated. - Right ventricle: Systolic function was mildly reduced. - Right  atrium: The atrium was mildly to moderately dilated. - Tricuspid valve: There was mild-moderate regurgitation. - Pulmonary arteries: Incomplete TR jet to accurately estimated   PASP.  ASSESSMENT AND PLAN:  1.  Atrial fibrillation/atrial flutter: Brent Wilkins on Eliquis and dofetilide.  Fortunately he is back in sinus rhythm after dofetilide loading.  He was in atrial fibrillation for a few years prior to this.  He is feeling much better in sinus rhythm.  Continue T consent.  This patients CHA2DS2-VASc Score and unadjusted Ischemic Stroke Rate (% per year) is equal to 3.2 % stroke rate/year from a score of 3  Above score calculated as 1 point each if present [CHF, HTN, DM, Vascular=MI/PAD/Aortic Plaque, Age if 65-74, or Male] Above score calculated as 2 points each if present [Age > 75, or Stroke/TIA/TE]  2. Syncope: No further episodes of syncope since starting on decreasing.  Continues to not drive.  Continue current management.  3. Hypertension: Currently well controlled.  No changes.  4.  Resting tremor: Was better on propranolol previously.  Was stopped in the hospital due to bradycardia.  Is no longer bradycardic.  We [place] Brent Wilkins plan to restart today.   No interaction with dofetilide.  Current medicines are reviewed at length with the patient today.   The patient does not have concerns regarding his medicines.  The following changes were made today:  Restart propranolol  Labs/ tests ordered today include:  No orders of the defined types were placed in this encounter.    Disposition:   FU with Brent Wilkins 6 months  Signed, Brent Gad [name], MD  06/10/2017 10:44 AM     [facility] [address] [place] [address] [place] [address] [phone] (office) [phone] (fax)

## 2017-06-11 ENCOUNTER — Other Ambulatory Visit: Payer: Self-pay | Admitting: Internal Medicine

## 2017-06-11 NOTE — Telephone Encounter (Signed)
Eliquis 5mg  refill request received; pt is 76 yrs old, wt-87.7kg, Crea-1.09 on 05/15/17, last seen by Dr. Curt Bears on 06/10/17; will send in refill to requested pharmacy.

## 2017-06-16 ENCOUNTER — Ambulatory Visit (INDEPENDENT_AMBULATORY_CARE_PROVIDER_SITE_OTHER): Payer: Medicare Other | Admitting: Family Medicine

## 2017-06-16 ENCOUNTER — Encounter: Payer: Self-pay | Admitting: Family Medicine

## 2017-06-16 ENCOUNTER — Other Ambulatory Visit: Payer: Self-pay

## 2017-06-16 VITALS — BP 138/76 | HR 72 | Temp 97.9°F | Resp 16 | Ht 70.0 in | Wt 197.0 lb

## 2017-06-16 DIAGNOSIS — G250 Essential tremor: Secondary | ICD-10-CM

## 2017-06-16 DIAGNOSIS — Z8546 Personal history of malignant neoplasm of prostate: Secondary | ICD-10-CM

## 2017-06-16 DIAGNOSIS — I10 Essential (primary) hypertension: Secondary | ICD-10-CM | POA: Diagnosis not present

## 2017-06-16 DIAGNOSIS — E039 Hypothyroidism, unspecified: Secondary | ICD-10-CM | POA: Diagnosis not present

## 2017-06-16 DIAGNOSIS — G629 Polyneuropathy, unspecified: Secondary | ICD-10-CM

## 2017-06-16 DIAGNOSIS — Z23 Encounter for immunization: Secondary | ICD-10-CM | POA: Diagnosis not present

## 2017-06-16 HISTORY — DX: Personal history of malignant neoplasm of prostate: Z85.46

## 2017-06-16 HISTORY — DX: Polyneuropathy, unspecified: G62.9

## 2017-06-16 NOTE — Progress Notes (Signed)
Chief Complaint  Patient presents with  . Hypertension  Very pleasant 76 year old gentleman who is here for his first visit.  He is under the care of cardiology for long-standing atrial fibrillation.  He had a spell with his atrial fibrillation where his heart rate dropped into the 30s.  He had syncope and collapse.  He had a closed head injury with staples and bleeding.  He is on long-standing anticoagulation.  He has been fully worked up and is on new medication.  His atrial fibrillation is now under control with medicine and he feels well.  He has had no more dizziness or syncope.  No problems with his blood pressure.  He is still in his 33-month window where he cannot drive after syncope, and is frustrated, but pleased with his improvement. He takes a beta-blocker for benign essential tremor.  This works reasonably well. 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  Uncertain etiology.  Controlled with gabapentin. He has essential hypertension that is controlled. 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 He has a history of GERD, stricture, and dilatation.  He is having some dysphasia and will follow up with his GI doctors. On his medicine list there is Celebrex.  I cautioned him against taking Celebrex with a history of GI problems and long-term anticoagulation.  He states he takes it infrequently, and only with a meal. He has a history of prostate cancer.  He is under the care of a urologist.  He sees them yearly.  He is being treated with medication.  He has no urinary symptoms. He is up-to-date with his colon cancer screening.  He goes every 5 years due to a history of colon polyps.   Patient Active Problem List   Diagnosis Date Noted  . History of prostate cancer 06/16/2017  . Benign essential tremor 06/16/2017  . Polyneuropathy 06/16/2017  . CKD (chronic kidney disease) stage 2, GFR 60-89  ml/min 03/21/2017  . Syncope, cardiogenic 03/20/2017  . Atrial fibrillation ([place]) 06/05/2015  . Essential hypertension 06/05/2015  . 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 Dysphagia 10/22/2011  . GERD (gastroesophageal reflux disease) 10/22/2011    Outpatient Encounter Medications as of 06/16/2017  Medication Sig  . dofetilide (TIKOSYN) 500 MCG capsule Take 1 capsule (500 mcg total) by mouth 2 (two) times daily.  Marland Kitchen ELIQUIS 5 MG TABS tablet TAKE 1 TABLET BY MOUTH TWO  TIMES DAILY  . finasteride (PROSCAR) 5 MG tablet Take 5 mg by mouth daily.  . fluticasone (FLONASE) 50 MCG/ACT nasal spray Place 1 spray into both nostrils daily as needed for allergies.   Marland Kitchen gabapentin (NEURONTIN) 300 MG capsule Take 300 mg by mouth 2 (two) times daily.   Marland Kitchen levothyroxine (SYNTHROID, LEVOTHROID) 75 MCG tablet Take 75 mcg by mouth daily.  Marland Kitchen losartan (COZAAR) 25 MG tablet Take 1 tablet (25 mg total) by mouth daily.  . Omega-3 Fatty Acids (RA FISH OIL) 1400 MG CPDR Take 1,400 mg by mouth daily.  Vladimir Faster Glycol-Propyl Glycol (SYSTANE) 0.4-0.3 % SOLN Place 1-2 drops into both eyes 2 (two) times daily as needed (dry).   . propranolol (INDERAL) 10 MG tablet Take 1 tablet (10 mg total) daily by mouth.  . ranitidine (ZANTAC) 150 MG tablet Take 150 mg at bedtime by mouth.  . Tamsulosin HCl (FLOMAX) 0.4 MG CAPS Take 0.4 mg by mouth daily after breakfast.   .  vitamin B-12 (CYANOCOBALAMIN) 500 MCG tablet Take 1,000 mcg by mouth daily.   . celecoxib (CELEBREX) 200 MG capsule Take 200 mg by mouth.   No facility-administered encounter medications on file as of 06/16/2017.     Past Medical History:  Diagnosis Date  . Allergy    hay fever  . Arthritis    right ankle  . Cataract    surgery 2016  . Chronic kidney disease    stones many years ago  . Dizziness 12/13/2015  . Dysphagia   . Frequent urination at night   . GERD (gastroesophageal reflux disease)   . History of kidney stones   . HTN  (hypertension)   . Hypothyroidism   . 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 Neuropathy    Bilateral ankles  . Polyneuropathy 06/16/2017  . Prostate cancer [facility]) 2010   Dr. Rosana Hoes  . Schatzki's ring 10/22/2011  . Seasonal allergies   . Syncope  dx. 8 yrs ago    Past Surgical History:  Procedure Laterality Date  . APPENDECTOMY    . CHOLECYSTECTOMY    . COLONOSCOPY  07/23/10   Dr. Vivi Ferns rectum, scattered pancolonic diverticula  . COLONOSCOPY  08/10/1999   internal hemorrhoids,inflammatory polyp  . ESOPHAGOGASTRODUODENOSCOPY  04/12/08   Prominent Schatzki's ring, erosive reflux esophagitis, multiple antral erosions, small hiatal hernia, reactive gastropathy, status post dilation with 71F  . EYE SURGERY    . TONSILLECTOMY    . TOTAL ANKLE ARTHROPLASTY Right 07/14/2014   dr hewitt    Social History   Socioeconomic History  . Marital status: Married    Spouse name: Candace  . Number of children: 2  . Years of education: Not on file  . Highest education level: Bachelor's degree (e.g., BA, AB, BS)  Social Needs  . Financial resource strain: Not hard at all  . Food insecurity - worry: Never true  . Food insecurity - inability: Never true  . Transportation needs - medical: No  . Transportation needs - non-medical: No  Occupational History  . Occupation: retired; Ambulance person: RETIRED  Tobacco Use  . Smoking status: Never Smoker  . Smokeless tobacco: Never Used  Substance and Sexual Activity  . Alcohol use: Yes    Alcohol/week: 0.6 - 1.2 oz    Types: 1 - 2 Cans of beer per week    Comment: 2 beers a week  . Drug use: No  . Sexual activity: Yes  Other Topics Concern  . Not on file  Social History Narrative   Lives with wife Heloise Beecham drive until Feb - 6 months after episode    Family History  Problem Relation Age of Onset  . Prostate cancer Father   . Cancer Father        prostate to bone  . Hypertension Father   . Prostate cancer Son  59  . ALS Son   . Cancer Son 89       prostate cancer  . Prostate cancer Brother   . Cancer Brother        prostate  . Heart disease Mother        died at 33  . Arthritis Mother   . Colon cancer Neg Hx     Review of Systems  Constitutional: Negative for chills, fever and weight loss.  HENT: Negative for congestion and hearing loss.   Eyes: Negative for blurred vision and pain.  Respiratory: Negative for cough  and shortness of breath.   Cardiovascular: Negative for chest pain and leg swelling.  Gastrointestinal: Negative for abdominal pain, constipation, diarrhea and heartburn.  Genitourinary: Negative for dysuria and frequency.  Musculoskeletal: Positive for joint pain. Negative for falls and myalgias.       Ankle arthritis  Neurological: Negative for dizziness, seizures and headaches.       Prior syncopal spell, 1 only.  Psychiatric/Behavioral: Negative for depression. The patient is not nervous/anxious and does not have insomnia.     BP 138/76 (BP Location: Right Arm, Patient Position: Sitting, Cuff Size: Normal)   Pulse 72   Temp 97.9 F (36.6 C) (Temporal)   Resp 16   Ht 5\' 10"  (1.778 m)   Wt 197 lb (89.4 kg)   SpO2 95%   BMI 28.27 kg/m   Physical Exam  Constitutional: He is oriented to person, place, and time. He appears well-developed and well-nourished.  HENT:  Head: Normocephalic and atraumatic.  Mouth/Throat: Oropharynx is clear and moist.  Eyes: Conjunctivae are normal. Pupils are equal, round, and reactive to light.  Neck: Normal range of motion. Neck supple. No thyromegaly present.  Cardiovascular: Normal rate, regular rhythm and normal heart sounds.  Occasional ectopy  Pulmonary/Chest: Effort normal and breath sounds normal. No respiratory distress.  Abdominal: Soft. Bowel sounds are normal.  Musculoskeletal: Normal range of motion. He exhibits no edema.  Lymphadenopathy:    He has no cervical adenopathy.  Neurological: He is alert and oriented to  person, place, and time.  Gait normal  Skin: Skin is warm and dry.  Psychiatric: He has a normal mood and affect. His behavior is normal. Thought content normal.  Nursing note and vitals reviewed.  ASSESSMENT/PLAN:  1. Need for pneumococcal vaccination Done - Pneumococcal conjugate vaccine 13-valent IM  2. History of prostate cancer Under care of urology.  Stable  3. Benign essential tremor On beta-blocker, stable  4. Polyneuropathy On gabapentin, stable  5. Essential hypertension Well-controlled - CBC - COMPLETE METABOLIC PANEL WITH GFR - Lipid panel - VITAMIN D 25 Hydroxy (Vit-D Deficiency, Fractures) - Urinalysis, Routine w reflex microscopic - TSH 7.  Hypothyroidism  Greater than 50% of this visit was spent in counseling and coordinating care.  Total face to face time:   45 minutes was spent with patient reviewing his medical history, the workup and treatment of his syncopal episode, reason that he is unable to drive, diet exercise and healthy lifestyle.   Patient Instructions  Continue to eat well and stay active Call for refills or problems See me every six months    Raylene Everts, MD

## 2017-06-16 NOTE — Patient Instructions (Addendum)
Continue to eat well and stay active Call for refills or problems See me every six months

## 2017-06-18 ENCOUNTER — Encounter: Payer: Self-pay | Admitting: Nurse Practitioner

## 2017-06-18 ENCOUNTER — Ambulatory Visit: Payer: Medicare Other | Admitting: Nurse Practitioner

## 2017-06-18 VITALS — BP 140/88 | HR 56 | Temp 97.3°F | Ht 70.0 in | Wt 195.0 lb

## 2017-06-18 DIAGNOSIS — K219 Gastro-esophageal reflux disease without esophagitis: Secondary | ICD-10-CM

## 2017-06-18 DIAGNOSIS — R1310 Dysphagia, unspecified: Secondary | ICD-10-CM | POA: Diagnosis not present

## 2017-06-18 NOTE — Progress Notes (Signed)
Referring Provider: Redmond School, MD Primary Care Physician:  Raylene Everts, MD Primary GI:  Dr. Gala Romney  Chief Complaint  Patient presents with  . Gastroesophageal Reflux    doing well when meds are taken. heart dr. d/c prilosec and started zantac    HPI:   Brent Wilkins is a 76 y.o. male who presents for follow-up on GERD.  The patient was last seen in our office 10/10/2016 at which point he was doing okay overall with GERD well managed since twice a day dosing of omeprazole.  Rare NSAIDs for arthritis but tends to avoid them.  Otherwise no breakthrough symptoms and no other GI symptoms.  Commended continue Prilosec twice a day, follow-up in 6 months.  Today he states he's doing well. Was admitted twice in the past few months related to AFib and Syncope. These symptoms resolved with addition of Tikosyn. Realized being off PPI in the hospital his GERD was pretty bad. States when he was in the hospital the first time, his PPI was discontinued. States cardiology during his [facility] admission stopped hsi PPI because they felt it was effecting "something, but I don't remember what it was." I cannot find any documentation of this. Whoever stopped his PPI put him on Zantac. It is effective for him. His takes Zantac 150 mg bid and his GERD is well managed as long as he takes his medication. He is having some increased swallowing issues with cold liquids and "very dry meats." Last EGD 5 years ago. Denies abdominal pain, N/V, hematochezia, melena, fever, chills, unintentional weight loss. Has lost some weight with a concerted effort. Denies chest pain, dyspnea, dizziness, lightheadedness, syncope, near syncope. Denies any other upper or lower GI symptoms.  NOTE: Patient PMH and [place] incomplete in this note due to computer issues. See history tab for complete information. History tab information was reviewed with the patient and found to be correct.  Past Medical History:  Diagnosis Date  .  Allergy    hay fever  . Arthritis    right ankle  . Cataract    surgery 2016  . Chronic kidney disease    stones many years ago  . Dizziness 12/13/2015  . Dysphagia   . Frequent urination at night   . GERD (gastroesophageal reflux disease)   . History of kidney stones   . HTN (hypertension)   . Hypothyroidism   . 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 Neuropathy    Bilateral ankles  . Polyneuropathy 06/16/2017  . Prostate cancer [facility]) 2010   Dr. Rosana Hoes  . Schatzki's ring 10/22/2011  . Seasonal allergies   . Syncope  dx. 8 yrs ago    Past Surgical History:  Procedure Laterality Date  . APPENDECTOMY    . CHOLECYSTECTOMY    . COLONOSCOPY  07/23/10   Dr. Vivi Ferns rectum, scattered pancolonic diverticula  . COLONOSCOPY  08/10/1999   internal hemorrhoids,inflammatory polyp  . ESOPHAGOGASTRODUODENOSCOPY  04/12/08   Prominent Schatzki's ring, erosive reflux esophagitis, multiple antral erosions, small hiatal hernia, reactive gastropathy, status post dilation with 33F  . EYE SURGERY    . TONSILLECTOMY    . TOTAL ANKLE ARTHROPLASTY Right 07/14/2014   dr hewitt    Current Outpatient Medications  Medication Sig Dispense Refill  . celecoxib (CELEBREX) 200 MG capsule Take 200 mg by mouth.    . dofetilide (TIKOSYN) 500 MCG capsule Take 1 capsule (500 mcg total) by mouth 2 (two) times daily. 180 capsule  1  . ELIQUIS 5 MG TABS tablet TAKE 1 TABLET BY MOUTH TWO  TIMES DAILY 180 tablet 3  . finasteride (PROSCAR) 5 MG tablet Take 5 mg by mouth daily.    . fluticasone (FLONASE) 50 MCG/ACT nasal spray Place 1 spray into both nostrils daily as needed for allergies.     Marland Kitchen gabapentin (NEURONTIN) 300 MG capsule Take 300 mg by mouth 2 (two) times daily.     Marland Kitchen levothyroxine (SYNTHROID, LEVOTHROID) 75 MCG tablet Take 75 mcg by mouth daily.  0  . losartan (COZAAR) 25 MG tablet Take 1 tablet (25 mg total) by mouth daily. 90 tablet 3  . Omega-3 Fatty Acids (RA FISH OIL) 1400 MG CPDR Take 1,400 mg by  mouth daily.    Vladimir Faster Glycol-Propyl Glycol (SYSTANE) 0.4-0.3 % SOLN Place 1-2 drops into both eyes 2 (two) times daily as needed (dry).     . propranolol (INDERAL) 10 MG tablet Take 1 tablet (10 mg total) daily by mouth. 90 tablet 3  . ranitidine (ZANTAC) 150 MG tablet Take 150 mg at bedtime by mouth.    . Tamsulosin HCl (FLOMAX) 0.4 MG CAPS Take 0.4 mg by mouth daily after breakfast.     . vitamin B-12 (CYANOCOBALAMIN) 500 MCG tablet Take 1,000 mcg by mouth daily.      No current facility-administered medications for this visit.     Allergies as of 06/18/2017 - Review Complete 06/18/2017  Allergen Reaction Noted  . Tetanus toxoids Swelling 05/10/2013  . Statins Other (See Comments) 05/10/2013  . Doxycycline Rash 09/30/2011  . Oxycodone Other (See Comments) 07/06/2014  . Penicillins Swelling and Rash 10/22/2011  . Sulfa antibiotics Swelling and Rash 10/22/2011  . Xarelto [rivaroxaban] Rash 06/16/2017    Family History  Problem Relation Age of Onset  . Prostate cancer Father   . Cancer Father        prostate to bone  . Hypertension Father   . Prostate cancer Son 33  . ALS Son   . Cancer Son 1       prostate cancer  . Prostate cancer Brother   . Cancer Brother        prostate  . Heart disease Mother        died at 53  . Arthritis Mother   . Colon cancer Neg Hx     Social History   Socioeconomic History  . Marital status: Married    Spouse name: Candace  . Number of children: 2  . Years of education: None  . Highest education level: Bachelor's degree (e.g., BA, AB, BS)  Social Needs  . Financial resource strain: Not hard at all  . Food insecurity - worry: Never true  . Food insecurity - inability: Never true  . Transportation needs - medical: No  . Transportation needs - non-medical: No  Occupational History  . Occupation: retired; Ambulance person: RETIRED  Tobacco Use  . Smoking status: Never Smoker  . Smokeless tobacco: Never Used    Substance and Sexual Activity  . Alcohol use: Yes    Alcohol/week: 0.6 - 1.2 oz    Types: 1 - 2 Cans of beer per week    Comment: 2 beers a week  . Drug use: No  . Sexual activity: Yes  Other Topics Concern  . None  Social History Narrative   Lives with wife Candace   Cannot drive until Feb - 6 months after episode    Review  of Systems: General: Negative for anorexia, weight loss, fever, chills, fatigue, weakness. ENT: Negative for hoarseness, difficulty swallowing. CV: Negative for chest pain, angina, palpitations, peripheral edema.  Respiratory: Negative for dyspnea at rest, cough, sputum, wheezing.  GI: See history of present illness. Endo: Negative for unusual weight change.  Heme: Negative for bruising or bleeding. Allergy: Negative for rash or hives.   Physical Exam: BP 140/88   Pulse (!) 56   Temp (!) 97.3 F (36.3 C) (Oral)   Ht 5\' 10"  (1.778 m)   Wt 195 lb (88.5 kg)   BMI 27.98 kg/m  General:   Alert and oriented. Pleasant and cooperative. Well-nourished and well-developed.  Eyes:  Without icterus, sclera clear and conjunctiva pink.  Ears:  Normal auditory acuity. Cardiovascular:  S1, S2 present without murmurs appreciated. Extremities without clubbing or edema. Respiratory:  Clear to auscultation bilaterally. No wheezes, rales, or rhonchi. No distress.  Gastrointestinal:  +BS, rounded but soft, non-tender and non-distended. No HSM noted. No guarding or rebound. No masses appreciated.  Rectal:  Deferred  Musculoskalatal:  Symmetrical without gross deformities. Neurologic:  Alert and oriented x4;  grossly normal neurologically. Psych:  Alert and cooperative. Normal mood and affect. Heme/Lymph/Immune: No excessive bruising noted.    06/18/2017 4:03 PM   Disclaimer: This note was dictated with voice recognition software. Similar sounding words can inadvertently be transcribed and may not be corrected upon review.

## 2017-06-18 NOTE — Assessment & Plan Note (Addendum)
His cardiologist apparently stopped his PPI, along with multiple other medications, related to his syncope and A. fib with bradycardia.  He has recently been restarted on Tikosyn.  They did place him on Zantac 150 mg twice a day.  He states this is controlling his GERD symptoms well.  At this point, given his need to be off PPI and the effectiveness of his H2 receptor blocker we will not restart PPI and will leave him on Zantac twice a day.  Return for follow-up in 2 months.

## 2017-06-18 NOTE — Assessment & Plan Note (Signed)
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  Last EGD was approximately 5 years ago.  He states he is not as bad as it was last time, but could get there over a long period of time.  He is wanting to hold off on endoscopy at this point given his multiple admissions in the past he is agreeable to returning in 2 months to discuss upper endoscopy with possible dilation related to dysphasia.  Right now he is only having problems with really cold liquids and very dry meats.  Recommend chewing precautions, return for follow-up as agreed.

## 2017-06-18 NOTE — Patient Instructions (Addendum)
1. Continue your current medications. 2. Return for follow-up in 2 months to discuss doing an upper endoscopy and possible dilation to help your swallowing problems. 3. Call us if you have any questions or concerns.   Happy Thanksgiving!!!

## 2017-06-19 ENCOUNTER — Encounter: Payer: Self-pay | Admitting: Internal Medicine

## 2017-06-19 NOTE — Progress Notes (Signed)
cc'ed to pcp °

## 2017-06-30 ENCOUNTER — Encounter: Payer: Self-pay | Admitting: Internal Medicine

## 2017-06-30 ENCOUNTER — Ambulatory Visit: Payer: Medicare Other | Admitting: Internal Medicine

## 2017-06-30 VITALS — BP 130/74 | HR 92 | Ht 70.0 in | Wt 202.0 lb

## 2017-06-30 DIAGNOSIS — I480 Paroxysmal atrial fibrillation: Secondary | ICD-10-CM | POA: Diagnosis not present

## 2017-06-30 DIAGNOSIS — R55 Syncope and collapse: Secondary | ICD-10-CM

## 2017-06-30 NOTE — Patient Instructions (Signed)
Your physician recommends that you schedule a follow-up appointment in: February 2019 with Dr Harrington Challenger    Your physician recommends that you continue on your current medications as directed. Please refer to the Current Medication list given to you today.    If you need a refill on your cardiac medications before your next appointment, please call your pharmacy.      No labs or tests ordered today.      Thank you for choosing [facility] !

## 2017-06-30 NOTE — Progress Notes (Signed)
Cardiology Office Note   Date:  06/30/2017   ID:  Caylor, Tallarico 1940-09-21, MRN [id number]  PCP:  Raylene Everts, MD  Cardiologist:   Dorris Carnes, MD   F/U of afib and dizziness    History of Present Illness: Brent Wilkins is a 76 y.o. male with a history of syncope and positive tilt tabale  Also HL , PAF  CHASVASc 3.   Holter  Average HR 67  No significant pauses    n Inderal decreased to 10 mg per day due to dizziness  I asw the pt in 2017s   He was seen by Arnold Long after in 2018  Developed persistent atrial fib  Seen by Maximino Greenland  Admitted forTikosyn load  Alos a nistory of syncope in August  ? If post termination pause  Last seen by Elliot Cousin  in early November   Pt says he converted to SR after first dose of Tikosyn given  Has felt great since  Energy great  No SOB No dizzienss  No syncope     Outpatient Medications Prior to Visit  Medication Sig Dispense Refill  . celecoxib (CELEBREX) 200 MG capsule Take 200 mg by mouth.    . dofetilide (TIKOSYN) 500 MCG capsule Take 1 capsule (500 mcg total) by mouth 2 (two) times daily. 180 capsule 1  . ELIQUIS 5 MG TABS tablet TAKE 1 TABLET BY MOUTH TWO  TIMES DAILY 180 tablet 3  . finasteride (PROSCAR) 5 MG tablet Take 5 mg by mouth daily.    . fluticasone (FLONASE) 50 MCG/ACT nasal spray Place 1 spray into both nostrils daily as needed for allergies.     Marland Kitchen gabapentin (NEURONTIN) 300 MG capsule Take 300 mg by mouth 2 (two) times daily.     Marland Kitchen levothyroxine (SYNTHROID, LEVOTHROID) 75 MCG tablet Take 75 mcg by mouth daily.  0  . losartan (COZAAR) 25 MG tablet Take 1 tablet (25 mg total) by mouth daily. 90 tablet 3  . Omega-3 Fatty Acids (RA FISH OIL) 1400 MG CPDR Take 1,400 mg by mouth daily.    Vladimir Faster Glycol-Propyl Glycol (SYSTANE) 0.4-0.3 % SOLN Place 1-2 drops into both eyes 2 (two) times daily as needed (dry).     . propranolol (INDERAL) 10 MG tablet Take 1 tablet (10 mg total) daily by mouth. 90 tablet 3  .  ranitidine (ZANTAC) 150 MG tablet Take 150 mg at bedtime by mouth.    . Tamsulosin HCl (FLOMAX) 0.4 MG CAPS Take 0.4 mg by mouth daily after breakfast.     . vitamin B-12 (CYANOCOBALAMIN) 500 MCG tablet Take 1,000 mcg by mouth daily.      No facility-administered medications prior to visit.      Allergies:   Tetanus toxoids; Statins; Doxycycline; Oxycodone; Penicillins; Sulfa antibiotics; and Xarelto [rivaroxaban]   Past Medical History:  Diagnosis Date  . Allergy    hay fever  . Arthritis    right ankle  . Cataract    surgery 2016  . Chronic kidney disease    stones many years ago  . Dizziness 12/13/2015  . Dysphagia   . Frequent urination at night   . GERD (gastroesophageal reflux disease)   . History of kidney stones   . HTN (hypertension)   . Hypothyroidism   . 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 Neuropathy    Bilateral ankles  . Polyneuropathy 06/16/2017  . Prostate cancer [facility]) 2010   Dr.  Davis  . Schatzki's ring 10/22/2011  . Seasonal allergies   . Syncope  dx. 8 yrs ago    Past Surgical History:  Procedure Laterality Date  . APPENDECTOMY    . CATARACT EXTRACTION W/PHACO Left 03/27/2015   Procedure: CATARACT EXTRACTION PHACO AND INTRAOCULAR LENS PLACEMENT LEFT EYE CDE=11.73;  Surgeon: Tonny Branch, MD;  Location: [facility];  Service: Ophthalmology;  Laterality: Left;  . CATARACT EXTRACTION W/PHACO Right 03/30/2015   Procedure: CATARACT EXTRACTION PHACO AND INTRAOCULAR LENS PLACEMENT RIGHT EYE CDE=7.35;  Surgeon: Tonny Branch, MD;  Location: [facility];  Service: Ophthalmology;  Laterality: Right;  . CHOLECYSTECTOMY    . COLONOSCOPY  07/23/10   Dr. Vivi Ferns rectum, scattered pancolonic diverticula  . COLONOSCOPY  08/10/1999   internal hemorrhoids,inflammatory polyp  . ESOPHAGOGASTRODUODENOSCOPY  04/12/08   Prominent Schatzki's ring, erosive reflux esophagitis, multiple antral erosions, small hiatal hernia, reactive gastropathy, status post dilation with 62F  .  ESOPHAGOGASTRODUODENOSCOPY  10/28/2011   Procedure: ESOPHAGOGASTRODUODENOSCOPY (EGD);  Surgeon: Daneil Dolin, MD;  Location: [facility];  Service: Endoscopy;  Laterality: N/A;  1:30  . EYE SURGERY    . MALONEY DILATION  10/28/2011   Procedure: Venia Minks DILATION;  Surgeon: Daneil Dolin, MD;  Location: [facility];  Service: Endoscopy;  Laterality: N/A;  . SAVORY DILATION  10/28/2011   Procedure: SAVORY DILATION;  Surgeon: Daneil Dolin, MD;  Location: [facility];  Service: Endoscopy;  Laterality: N/A;  . TONSILLECTOMY    . TOTAL ANKLE ARTHROPLASTY Left 11/25/2013   Procedure: TOTAL ANKLE ARTHOPLASTY;  Surgeon: Wylene Simmer, MD;  Location: [facility];  Service: Orthopedics;  Laterality: Left;  . TOTAL ANKLE ARTHROPLASTY Right 07/14/2014   dr hewitt  . TOTAL ANKLE ARTHROPLASTY Right 07/14/2014   Procedure: RIGHT TOTAL ANKLE ARTHOPLASTY;  Surgeon: Wylene Simmer, MD;  Location: [facility];  Service: Orthopedics;  Laterality: Right;     Social History:  The patient  reports that  has never smoked. he has never used smokeless tobacco. He reports that he drinks about 0.6 - 1.2 oz of alcohol per week. He reports that he does not use drugs.   Family History:  The patient's family history includes ALS in his son; Arthritis in his mother; Cancer in his brother and father; Cancer (age of onset: 70) in his son; Heart disease in his mother; Hypertension in his father; Prostate cancer in his brother and father; Prostate cancer (age of onset: 36) in his son.    ROS:  Please see the history of present illness. All other systems are reviewed and  Negative to the above problem except as noted.    PHYSICAL EXAM: VS:  BP 130/74 (BP Location: Right Arm)   Pulse 92   Ht 5\' 10"  (1.778 m)   Wt 202 lb (91.6 kg)   SpO2 95%   BMI 28.98 kg/m   GEN: Well nourished, well developed, in no acute distress HEENT: normal Neck: no JVD, carotid bruits, or masses Cardiac: RRR; no murmurs, rubs, or gallops,no edema    Respiratory:  clear to auscultation bilaterally, normal work of breathing GI: soft, nontender, nondistended, + BS  No hepatomegaly  MS: no deformity Moving all extremities   Skin: warm and dry, no rash Neuro:  Strength and sensation are intact Psych: euthymic mood, full affect   EKG:  EKG is not ordered today.   Lipid Panel    Component Value Date/Time   CHOL 141 11/14/2016 0928   TRIG 85 11/14/2016 0928  HDL 50 11/14/2016 0928   CHOLHDL 2.8 11/14/2016 0928   VLDL 17 11/14/2016 0928   LDLCALC 74 11/14/2016 0928      Wt Readings from Last 3 Encounters:  06/30/17 202 lb (91.6 kg)  06/18/17 195 lb (88.5 kg)  06/16/17 197 lb (89.4 kg)      ASSESSMENT AND PLAN:  1.  Afib  Currently on Tikosyn  Doing very good  Would keep on Xarelto   F/U in Feb    2  Syncope  Review of records  ? Post termination pause  No recurrence  No driving until Feb   3  Dizziness   Denies    F/U in Feb with labs      Signed, Dorris Carnes, MD  06/30/2017 10:26 AM    [address], [place], [place]  [address] Phone: [phone]; Fax: [phone]

## 2017-07-23 DIAGNOSIS — L570 Actinic keratosis: Secondary | ICD-10-CM | POA: Diagnosis not present

## 2017-07-23 DIAGNOSIS — L308 Other specified dermatitis: Secondary | ICD-10-CM | POA: Diagnosis not present

## 2017-07-23 DIAGNOSIS — X32XXXD Exposure to sunlight, subsequent encounter: Secondary | ICD-10-CM | POA: Diagnosis not present

## 2017-07-24 ENCOUNTER — Other Ambulatory Visit: Payer: Self-pay

## 2017-07-24 MED ORDER — LEVOTHYROXINE SODIUM 75 MCG PO TABS: 75.0000 ug | ORAL_TABLET | Freq: Every day | ORAL | 3 refills | Status: DC

## 2017-07-24 MED ORDER — FLUTICASONE PROPIONATE 50 MCG/ACT NA SUSP: 1.0000 | Freq: Every day | NASAL | 3 refills | Status: DC | PRN

## 2017-08-11 DIAGNOSIS — G5791 Unspecified mononeuropathy of right lower limb: Secondary | ICD-10-CM | POA: Diagnosis not present

## 2017-08-11 DIAGNOSIS — M722 Plantar fascial fibromatosis: Secondary | ICD-10-CM | POA: Diagnosis not present

## 2017-08-11 DIAGNOSIS — G6181 Chronic inflammatory demyelinating polyneuritis: Secondary | ICD-10-CM | POA: Diagnosis not present

## 2017-08-19 DIAGNOSIS — L570 Actinic keratosis: Secondary | ICD-10-CM | POA: Diagnosis not present

## 2017-08-19 DIAGNOSIS — X32XXXD Exposure to sunlight, subsequent encounter: Secondary | ICD-10-CM | POA: Diagnosis not present

## 2017-08-20 ENCOUNTER — Ambulatory Visit: Payer: Medicare Other | Admitting: Nurse Practitioner

## 2017-08-20 ENCOUNTER — Encounter: Payer: Self-pay | Admitting: Nurse Practitioner

## 2017-08-20 ENCOUNTER — Other Ambulatory Visit: Payer: Self-pay | Admitting: *Deleted

## 2017-08-20 ENCOUNTER — Telehealth: Payer: Self-pay

## 2017-08-20 ENCOUNTER — Encounter: Payer: Self-pay | Admitting: *Deleted

## 2017-08-20 ENCOUNTER — Encounter: Payer: Self-pay | Admitting: Internal Medicine

## 2017-08-20 VITALS — BP 176/86 | HR 59 | Temp 96.8°F | Ht 70.0 in | Wt 200.8 lb

## 2017-08-20 DIAGNOSIS — K219 Gastro-esophageal reflux disease without esophagitis: Secondary | ICD-10-CM | POA: Diagnosis not present

## 2017-08-20 DIAGNOSIS — I481 Persistent atrial fibrillation: Secondary | ICD-10-CM

## 2017-08-20 DIAGNOSIS — R1310 Dysphagia, unspecified: Secondary | ICD-10-CM

## 2017-08-20 DIAGNOSIS — I4819 Other persistent atrial fibrillation: Secondary | ICD-10-CM

## 2017-08-20 NOTE — Assessment & Plan Note (Signed)
Assistant A. fib for which the patient is now typically in sinus rhythm after Tikosyn initiation and ongoing treatment he does remain on Eliquis.  We will need to discuss with cardiology about holding Eliquis prior to his procedure.

## 2017-08-20 NOTE — Assessment & Plan Note (Signed)
The patient has recurrent dysphasia and is undergone EGD with dilation previously with resolution of symptoms.  It is been about 5 years since his last endoscopy and dilation.  Recommend he continue current medications to help with persistent GERD symptoms that can exacerbate dysphasia.  At this point we will set him up for an EGD with possible dilation.  We would need to hold Eliquis for 48 hours prior to procedure.  We will need to clear this with his cardiologist, Dr. Curt Bears.  Return for follow-up in 3 months.

## 2017-08-20 NOTE — Telephone Encounter (Signed)
OK to hold eliquis for EGD for 2 days.

## 2017-08-20 NOTE — Patient Instructions (Signed)
1. We will tentatively schedule your procedure today. 2. Continue your current medications. 3. We will discussed with your cardiologist about holding your Eliquis for 2 days prior to your procedure. 4. Return for follow-up in 3 months. 5. Call if you have any questions or concerns.

## 2017-08-20 NOTE — Assessment & Plan Note (Signed)
GERD symptoms generally well managed on Zantac 150 milligrams twice a day.  Recommend he continue this.  Return for follow-up in 3 months.

## 2017-08-20 NOTE — Telephone Encounter (Signed)
Noted  

## 2017-08-20 NOTE — Telephone Encounter (Signed)
Called spoke with pt and is aware okay to hold eliquis 2 days prior to EGD/+/-DIL. Pt scheduled for 09/10/17 at 12:30pm. Instructions mailed to pt.

## 2017-08-20 NOTE — Telephone Encounter (Signed)
Dr. Tomasa Blase,  [place], NP, would like pt to have an EGD with dilation. Is it ok for pt to hold Eliquis for 2 days prior to his procedure? Please advise.

## 2017-08-20 NOTE — H&P (View-Only) (Signed)
Referring Provider: Raylene Everts, MD Primary Care Physician:  Raylene Everts, MD Primary GI:  Dr. Gala Romney  Chief Complaint  Patient presents with  . Dysphagia    HPI:   Brent Wilkins is a 77 y.o. male who presents follow-up on dysphagia.  The patient was last seen in our office 06/18/2017 for GERD and dysphasia.  History of A. fib which was resolved with hospital admission and initiation of Tikosyn.  At his last visit he was on Zantac 150 mg twice a day and GERD is well managed on this.  Last EGD 5 years ago.  Overall was doing well.  Today he states he thinks it's time a esophageal dilation. He is having dysphagia with solid foods, no regurgitation. Worse with cold beverages. No pill dysphagia. Dysphagia symptoms occur about 1-2 times a week. Denies abdominal pain, N/V, hematochezia, melena, unintentional weight loss. GERD well controlled on Zantac twice daily. Denies chest pain, dyspnea, dizziness, lightheadedness, syncope, near syncope. Denies any other upper or lower GI symptoms.  Takes Ibuprofen intermittently about 2-3 times a week at most.  Past Medical History:  Diagnosis Date  . Allergy    hay fever  . Arthritis    right ankle  . Cataract    surgery 2016  . Chronic kidney disease    stones many years ago  . Dizziness 12/13/2015  . Dysphagia   . Frequent urination at night   . GERD (gastroesophageal reflux disease)   . History of kidney stones   . HTN (hypertension)   . Hypothyroidism   . 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 Neuropathy    Bilateral ankles  . Polyneuropathy 06/16/2017  . Prostate cancer [facility]) 2010   Dr. Rosana Hoes  . Schatzki's ring 10/22/2011  . Seasonal allergies   . Syncope  dx. 8 yrs ago    Past Surgical History:  Procedure Laterality Date  . APPENDECTOMY    . CATARACT EXTRACTION W/PHACO Left 03/27/2015   Procedure: CATARACT EXTRACTION PHACO AND INTRAOCULAR LENS PLACEMENT LEFT EYE CDE=11.73;  Surgeon: Tonny Branch, MD;  Location: [facility];  Service: Ophthalmology;  Laterality: Left;  . CATARACT EXTRACTION W/PHACO Right 03/30/2015   Procedure: CATARACT EXTRACTION PHACO AND INTRAOCULAR LENS PLACEMENT RIGHT EYE CDE=7.35;  Surgeon: Tonny Branch, MD;  Location: [facility];  Service: Ophthalmology;  Laterality: Right;  . CHOLECYSTECTOMY    . COLONOSCOPY  07/23/10   Dr. Vivi Ferns rectum, scattered pancolonic diverticula  . COLONOSCOPY  08/10/1999   internal hemorrhoids,inflammatory polyp  . ESOPHAGOGASTRODUODENOSCOPY  04/12/08   Prominent Schatzki's ring, erosive reflux esophagitis, multiple antral erosions, small hiatal hernia, reactive gastropathy, status post dilation with 84F  . ESOPHAGOGASTRODUODENOSCOPY  10/28/2011   Procedure: ESOPHAGOGASTRODUODENOSCOPY (EGD);  Surgeon: Daneil Dolin, MD;  [facility];  Service: Endoscopy;  Laterality: N/A;  1:30  . EYE SURGERY    . MALONEY DILATION  10/28/2011   Procedure: Venia Minks DILATION;  Surgeon: Daneil Dolin, MD;  [facility]: [facility];  Service: Endoscopy;  Laterality: N/A;  . SAVORY DILATION  10/28/2011   Procedure: SAVORY DILATION;  Surgeon: Daneil Dolin, MD;  [facility]: [facility];  Service: Endoscopy;  Laterality: N/A;  . TONSILLECTOMY    . TOTAL ANKLE ARTHROPLASTY Left 11/25/2013   Procedure: TOTAL ANKLE ARTHOPLASTY;  Surgeon: Wylene Simmer, MD;  Location: [place];  Service: Orthopedics;  Laterality: Left;  . TOTAL ANKLE ARTHROPLASTY Right 07/14/2014   dr hewitt  . TOTAL ANKLE  ARTHROPLASTY Right 07/14/2014   Procedure: RIGHT TOTAL ANKLE ARTHOPLASTY;  Surgeon: Wylene Simmer, MD;  Location: [place];  Service: Orthopedics;  Laterality: Right;    Current Outpatient Medications  Medication Sig Dispense Refill  . celecoxib (CELEBREX) 200 MG capsule Take 200 mg by mouth as needed.     . dofetilide (TIKOSYN) 500 MCG capsule Take 1 capsule (500 mcg total) by mouth 2 (two) times daily. 180 capsule 1  . ELIQUIS 5 MG TABS tablet TAKE 1 TABLET BY MOUTH TWO  TIMES DAILY 180 tablet 3    . finasteride (PROSCAR) 5 MG tablet Take 5 mg by mouth daily.    . fluticasone (FLONASE) 50 MCG/ACT nasal spray Place 1 spray into both nostrils daily as needed for allergies. 48 g 3  . gabapentin (NEURONTIN) 300 MG capsule Take 300 mg by mouth 2 (two) times daily.     Marland Kitchen ibuprofen (ADVIL,MOTRIN) 600 MG tablet Take 600 mg by mouth every 6 (six) hours as needed.    Marland Kitchen levothyroxine (SYNTHROID, LEVOTHROID) 75 MCG tablet Take 1 tablet (75 mcg total) by mouth daily. 90 tablet 3  . losartan (COZAAR) 25 MG tablet Take 1 tablet (25 mg total) by mouth daily. 90 tablet 3  . Omega-3 Fatty Acids (RA FISH OIL) 1400 MG CPDR Take 1,400 mg by mouth daily.    Vladimir Faster Glycol-Propyl Glycol (SYSTANE) 0.4-0.3 % SOLN Place 1-2 drops into both eyes 2 (two) times daily as needed (dry).     . propranolol (INDERAL) 10 MG tablet Take 1 tablet (10 mg total) daily by mouth. 90 tablet 3  . ranitidine (ZANTAC) 150 MG tablet Take 150 mg by mouth 2 (two) times daily.     . Tamsulosin HCl (FLOMAX) 0.4 MG CAPS Take 0.4 mg by mouth daily after breakfast.     . vitamin B-12 (CYANOCOBALAMIN) 500 MCG tablet Take 1,000 mcg by mouth daily.      No current facility-administered medications for this visit.     Allergies as of 08/20/2017 - Review Complete 08/20/2017  Allergen Reaction Noted  . Tetanus toxoids Swelling 05/10/2013  . Statins Other (See Comments) 05/10/2013  . Doxycycline Rash 09/30/2011  . Oxycodone Other (See Comments) 07/06/2014  . Penicillins Swelling and Rash 10/22/2011  . Sulfa antibiotics Swelling and Rash 10/22/2011  . Xarelto [rivaroxaban] Rash 06/16/2017    Family History  Problem Relation Age of Onset  . Prostate cancer Father   . Cancer Father        prostate to bone  . Hypertension Father   . Prostate cancer Son 48  . ALS Son   . Cancer Son 26       prostate cancer  . Prostate cancer Brother   . Cancer Brother        prostate  . Heart disease Mother        died at 83  . Arthritis Mother    . Colon cancer Neg Hx     Social History   Socioeconomic History  . Marital status: Married    Spouse name: Candace  . Number of children: 2  . Years of education: None  . Highest education level: Bachelor's degree (e.g., BA, AB, BS)  Social Needs  . Financial resource strain: Not hard at all  . Food insecurity - worry: Never true  . Food insecurity - inability: Never true  . Transportation needs - medical: No  . Transportation needs - non-medical: No  Occupational History  . Occupation: retired; Press photographer and  marketing    Employer: RETIRED  Tobacco Use  . Smoking status: Never Smoker  . Smokeless tobacco: Never Used  Substance and Sexual Activity  . Alcohol use: Yes    Alcohol/week: 0.6 - 1.2 oz    Types: 1 - 2 Cans of beer per week    Comment: 2 beers a week  . Drug use: No  . Sexual activity: Yes  Other Topics Concern  . None  Social History Narrative   Lives with wife Candace   Cannot drive until Feb - 6 months after episode    Review of Systems: General: Negative for anorexia, weight loss, fever, chills, fatigue, weakness. ENT: Negative for hoarseness. Admits solid food dysphagia. CV: Negative for chest pain, angina, palpitations, peripheral edema.  Respiratory: Negative for dyspnea at rest, cough, sputum, wheezing.  GI: See history of present illness. Endo: Negative for unusual weight change.  Heme: Negative for bruising or bleeding.  Physical Exam: BP (!) 176/86   Pulse (!) 59   Temp (!) 96.8 F (36 C) (Oral)   Ht 5\' 10"  (1.778 m)   Wt 200 lb 12.8 oz (91.1 kg)   BMI 28.81 kg/m  General:   Alert and oriented. Pleasant and cooperative. Well-nourished and well-developed.  Eyes:  Without icterus, sclera clear and conjunctiva pink.  Ears:  Normal auditory acuity. Cardiovascular:  S1, S2 present without murmurs appreciated. Extremities without clubbing or edema. Respiratory:  Clear to auscultation bilaterally. No wheezes, rales, or rhonchi. No distress.   Gastrointestinal:  +BS, rounded but soft, non-tender and non-distended. No HSM noted. No guarding or rebound. No masses appreciated.  Rectal:  Deferred  Musculoskalatal:  Symmetrical without gross deformities. Neurologic:  Alert and oriented x4;  grossly normal neurologically. Psych:  Alert and cooperative. Normal mood and affect. Heme/Lymph/Immune: No excessive bruising noted.    08/20/2017 11:45 AM   Disclaimer: This note was dictated with voice recognition software. Similar sounding words can inadvertently be transcribed and may not be corrected upon review.

## 2017-08-20 NOTE — Progress Notes (Signed)
Referring Provider: Raylene Everts, MD Primary Care Physician:  Raylene Everts, MD Primary GI:  Dr. Gala Romney  Chief Complaint  Patient presents with  . Dysphagia    HPI:   Brent Wilkins is a 77 y.o. male who presents follow-up on dysphagia.  The patient was last seen in our office 06/18/2017 for GERD and dysphasia.  History of A. fib which was resolved with hospital admission and initiation of Tikosyn.  At his last visit he was on Zantac 150 mg twice a day and GERD is well managed on this.  Last EGD 5 years ago.  Overall was doing well.  Today he states he thinks it's time a esophageal dilation. He is having dysphagia with solid foods, no regurgitation. Worse with cold beverages. No pill dysphagia. Dysphagia symptoms occur about 1-2 times a week. Denies abdominal pain, N/V, hematochezia, melena, unintentional weight loss. GERD well controlled on Zantac twice daily. Denies chest pain, dyspnea, dizziness, lightheadedness, syncope, near syncope. Denies any other upper or lower GI symptoms.  Takes Ibuprofen intermittently about 2-3 times a week at most.  Past Medical History:  Diagnosis Date  . Allergy    hay fever  . Arthritis    right ankle  . Cataract    surgery 2016  . Chronic kidney disease    stones many years ago  . Dizziness 12/13/2015  . Dysphagia   . Frequent urination at night   . GERD (gastroesophageal reflux disease)   . History of kidney stones   . HTN (hypertension)   . Hypothyroidism   . 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 Neuropathy    Bilateral ankles  . Polyneuropathy 06/16/2017  . Prostate cancer [facility]) 2010   Dr. Rosana Hoes  . Schatzki's ring 10/22/2011  . Seasonal allergies   . Syncope  dx. 8 yrs ago    Past Surgical History:  Procedure Laterality Date  . APPENDECTOMY    . CATARACT EXTRACTION W/PHACO Left 03/27/2015   Procedure: CATARACT EXTRACTION PHACO AND INTRAOCULAR LENS PLACEMENT LEFT EYE CDE=11.73;  Surgeon: Tonny Branch, MD;  Location: [facility];  Service: Ophthalmology;  Laterality: Left;  . CATARACT EXTRACTION W/PHACO Right 03/30/2015   Procedure: CATARACT EXTRACTION PHACO AND INTRAOCULAR LENS PLACEMENT RIGHT EYE CDE=7.35;  Surgeon: Tonny Branch, MD;  Location: [facility];  Service: Ophthalmology;  Laterality: Right;  . CHOLECYSTECTOMY    . COLONOSCOPY  07/23/10   Dr. Vivi Ferns rectum, scattered pancolonic diverticula  . COLONOSCOPY  08/10/1999   internal hemorrhoids,inflammatory polyp  . ESOPHAGOGASTRODUODENOSCOPY  04/12/08   Prominent Schatzki's ring, erosive reflux esophagitis, multiple antral erosions, small hiatal hernia, reactive gastropathy, status post dilation with 65F  . ESOPHAGOGASTRODUODENOSCOPY  10/28/2011   Procedure: ESOPHAGOGASTRODUODENOSCOPY (EGD);  Surgeon: Daneil Dolin, MD;  [facility];  Service: Endoscopy;  Laterality: N/A;  1:30  . EYE SURGERY    . MALONEY DILATION  10/28/2011   Procedure: Venia Minks DILATION;  Surgeon: Daneil Dolin, MD;  [facility];  Service: Endoscopy;  Laterality: N/A;  . SAVORY DILATION  10/28/2011   Procedure: SAVORY DILATION;  Surgeon: Daneil Dolin, MD;  [facility];  Service: Endoscopy;  Laterality: N/A;  . TONSILLECTOMY    . TOTAL ANKLE ARTHROPLASTY Left 11/25/2013   Procedure: TOTAL ANKLE ARTHOPLASTY;  Surgeon: Wylene Simmer, MD;  Location: [place];  Service: Orthopedics;  Laterality: Left;  . TOTAL ANKLE ARTHROPLASTY Right 07/14/2014   dr hewitt  . TOTAL ANKLE  ARTHROPLASTY Right 07/14/2014   Procedure: RIGHT TOTAL ANKLE ARTHOPLASTY;  Surgeon: Wylene Simmer, MD;  Location: [place];  Service: Orthopedics;  Laterality: Right;    Current Outpatient Medications  Medication Sig Dispense Refill  . celecoxib (CELEBREX) 200 MG capsule Take 200 mg by mouth as needed.     . dofetilide (TIKOSYN) 500 MCG capsule Take 1 capsule (500 mcg total) by mouth 2 (two) times daily. 180 capsule 1  . ELIQUIS 5 MG TABS tablet TAKE 1 TABLET BY MOUTH TWO  TIMES DAILY 180 tablet 3    . finasteride (PROSCAR) 5 MG tablet Take 5 mg by mouth daily.    . fluticasone (FLONASE) 50 MCG/ACT nasal spray Place 1 spray into both nostrils daily as needed for allergies. 48 g 3  . gabapentin (NEURONTIN) 300 MG capsule Take 300 mg by mouth 2 (two) times daily.     Marland Kitchen ibuprofen (ADVIL,MOTRIN) 600 MG tablet Take 600 mg by mouth every 6 (six) hours as needed.    Marland Kitchen levothyroxine (SYNTHROID, LEVOTHROID) 75 MCG tablet Take 1 tablet (75 mcg total) by mouth daily. 90 tablet 3  . losartan (COZAAR) 25 MG tablet Take 1 tablet (25 mg total) by mouth daily. 90 tablet 3  . Omega-3 Fatty Acids (RA FISH OIL) 1400 MG CPDR Take 1,400 mg by mouth daily.    Vladimir Faster Glycol-Propyl Glycol (SYSTANE) 0.4-0.3 % SOLN Place 1-2 drops into both eyes 2 (two) times daily as needed (dry).     . propranolol (INDERAL) 10 MG tablet Take 1 tablet (10 mg total) daily by mouth. 90 tablet 3  . ranitidine (ZANTAC) 150 MG tablet Take 150 mg by mouth 2 (two) times daily.     . Tamsulosin HCl (FLOMAX) 0.4 MG CAPS Take 0.4 mg by mouth daily after breakfast.     . vitamin B-12 (CYANOCOBALAMIN) 500 MCG tablet Take 1,000 mcg by mouth daily.      No current facility-administered medications for this visit.     Allergies as of 08/20/2017 - Review Complete 08/20/2017  Allergen Reaction Noted  . Tetanus toxoids Swelling 05/10/2013  . Statins Other (See Comments) 05/10/2013  . Doxycycline Rash 09/30/2011  . Oxycodone Other (See Comments) 07/06/2014  . Penicillins Swelling and Rash 10/22/2011  . Sulfa antibiotics Swelling and Rash 10/22/2011  . Xarelto [rivaroxaban] Rash 06/16/2017    Family History  Problem Relation Age of Onset  . Prostate cancer Father   . Cancer Father        prostate to bone  . Hypertension Father   . Prostate cancer Son 4  . ALS Son   . Cancer Son 85       prostate cancer  . Prostate cancer Brother   . Cancer Brother        prostate  . Heart disease Mother        died at 74  . Arthritis Mother    . Colon cancer Neg Hx     Social History   Socioeconomic History  . Marital status: Married    Spouse name: Candace  . Number of children: 2  . Years of education: None  . Highest education level: Bachelor's degree (e.g., BA, AB, BS)  Social Needs  . Financial resource strain: Not hard at all  . Food insecurity - worry: Never true  . Food insecurity - inability: Never true  . Transportation needs - medical: No  . Transportation needs - non-medical: No  Occupational History  . Occupation: retired; Press photographer and  marketing    Employer: RETIRED  Tobacco Use  . Smoking status: Never Smoker  . Smokeless tobacco: Never Used  Substance and Sexual Activity  . Alcohol use: Yes    Alcohol/week: 0.6 - 1.2 oz    Types: 1 - 2 Cans of beer per week    Comment: 2 beers a week  . Drug use: No  . Sexual activity: Yes  Other Topics Concern  . None  Social History Narrative   Lives with wife Candace   Cannot drive until Feb - 6 months after episode    Review of Systems: General: Negative for anorexia, weight loss, fever, chills, fatigue, weakness. ENT: Negative for hoarseness. Admits solid food dysphagia. CV: Negative for chest pain, angina, palpitations, peripheral edema.  Respiratory: Negative for dyspnea at rest, cough, sputum, wheezing.  GI: See history of present illness. Endo: Negative for unusual weight change.  Heme: Negative for bruising or bleeding.  Physical Exam: BP (!) 176/86   Pulse (!) 59   Temp (!) 96.8 F (36 C) (Oral)   Ht 5\' 10"  (1.778 m)   Wt 200 lb 12.8 oz (91.1 kg)   BMI 28.81 kg/m  General:   Alert and oriented. Pleasant and cooperative. Well-nourished and well-developed.  Eyes:  Without icterus, sclera clear and conjunctiva pink.  Ears:  Normal auditory acuity. Cardiovascular:  S1, S2 present without murmurs appreciated. Extremities without clubbing or edema. Respiratory:  Clear to auscultation bilaterally. No wheezes, rales, or rhonchi. No distress.   Gastrointestinal:  +BS, rounded but soft, non-tender and non-distended. No HSM noted. No guarding or rebound. No masses appreciated.  Rectal:  Deferred  Musculoskalatal:  Symmetrical without gross deformities. Neurologic:  Alert and oriented x4;  grossly normal neurologically. Psych:  Alert and cooperative. Normal mood and affect. Heme/Lymph/Immune: No excessive bruising noted.    08/20/2017 11:45 AM   Disclaimer: This note was dictated with voice recognition software. Similar sounding words can inadvertently be transcribed and may not be corrected upon review.

## 2017-08-21 NOTE — Telephone Encounter (Signed)
Noted, thank you

## 2017-08-21 NOTE — Progress Notes (Signed)
CC'D TO PCP °

## 2017-08-25 ENCOUNTER — Telehealth (HOSPITAL_COMMUNITY): Payer: Self-pay | Admitting: *Deleted

## 2017-08-25 NOTE — Telephone Encounter (Signed)
Patient approved for tikosyn patient assistance through 08/04/18. Patient ID #[id number]

## 2017-08-27 DIAGNOSIS — M47816 Spondylosis without myelopathy or radiculopathy, lumbar region: Secondary | ICD-10-CM | POA: Diagnosis not present

## 2017-08-27 DIAGNOSIS — M9903 Segmental and somatic dysfunction of lumbar region: Secondary | ICD-10-CM | POA: Diagnosis not present

## 2017-08-27 DIAGNOSIS — S39012A Strain of muscle, fascia and tendon of lower back, initial encounter: Secondary | ICD-10-CM | POA: Diagnosis not present

## 2017-08-28 DIAGNOSIS — M9903 Segmental and somatic dysfunction of lumbar region: Secondary | ICD-10-CM | POA: Diagnosis not present

## 2017-08-28 DIAGNOSIS — S39012A Strain of muscle, fascia and tendon of lower back, initial encounter: Secondary | ICD-10-CM | POA: Diagnosis not present

## 2017-08-28 DIAGNOSIS — M47816 Spondylosis without myelopathy or radiculopathy, lumbar region: Secondary | ICD-10-CM | POA: Diagnosis not present

## 2017-09-01 DIAGNOSIS — M9903 Segmental and somatic dysfunction of lumbar region: Secondary | ICD-10-CM | POA: Diagnosis not present

## 2017-09-01 DIAGNOSIS — M47816 Spondylosis without myelopathy or radiculopathy, lumbar region: Secondary | ICD-10-CM | POA: Diagnosis not present

## 2017-09-01 DIAGNOSIS — S39012A Strain of muscle, fascia and tendon of lower back, initial encounter: Secondary | ICD-10-CM | POA: Diagnosis not present

## 2017-09-05 ENCOUNTER — Ambulatory Visit (INDEPENDENT_AMBULATORY_CARE_PROVIDER_SITE_OTHER): Payer: Medicare Other | Admitting: Family Medicine

## 2017-09-05 ENCOUNTER — Ambulatory Visit: Payer: Medicare Other | Admitting: Family Medicine

## 2017-09-05 ENCOUNTER — Encounter: Payer: Self-pay | Admitting: Family Medicine

## 2017-09-05 VITALS — BP 180/108 | HR 70 | Temp 97.8°F | Resp 16 | Ht 70.0 in | Wt 198.5 lb

## 2017-09-05 DIAGNOSIS — J0101 Acute recurrent maxillary sinusitis: Secondary | ICD-10-CM

## 2017-09-05 MED ORDER — LOSARTAN POTASSIUM 50 MG PO TABS: 50.0000 mg | ORAL_TABLET | Freq: Every day | ORAL | 3 refills | Status: DC

## 2017-09-05 MED ORDER — CEFUROXIME AXETIL 250 MG PO TABS: 250.0000 mg | ORAL_TABLET | Freq: Two times a day (BID) | ORAL | 1 refills | Status: DC

## 2017-09-05 NOTE — Patient Instructions (Addendum)
Take the ceftin two times a day Continue with the flushing and staying hydrated  I am doubling the cozaar ( losartan) Take 50 mg a day  Continue to check your BP Let me know if the BP stays over 140/90

## 2017-09-05 NOTE — Progress Notes (Signed)
Chief Complaint  Patient presents with  . Headache   Patient is seen today for an acute visit.  He has had a "sinus infection" for 2 weeks.  He has sinus pressure and pain around his eyes and in his cheeks.  Postnasal drip.  Yellow drainage.  Mild sore throat.  No cough or chest congestion.  Mild ear pain.  Headache.  He has been using over-the-counter Flonase.  He is also been using a Nettie pot to rinse his sinuses twice a day.  He has been taking ibuprofen as needed pain.  He is reminded not to use ibuprofen while on his blood thinner as it increases risk of GI bleed.  He is advised to take only Tylenol if needed for pain.  He states that he has had sinus infections before.  He believes he has allergies.  He is taking a daily antihistamine.  No fever or chills.  No nausea or vomiting.  A second problem is elevated blood pressure.  He is compliant with his medication.  He checks his blood pressure once a day at home.  He states that lately it has been running in the 160-180/100 range.  This is been going on for the last couple of weeks.  His blood pressure is elevated in the office today.  We discussed blood pressure management and I am going to double his Cozaar to 100 mg a day.  He will follow blood pressure at home and let me know if it stays over 140/90.  Patient Active Problem List   Diagnosis Date Noted  . History of prostate cancer 06/16/2017  . Benign essential tremor 06/16/2017  . Polyneuropathy 06/16/2017  . Hypothyroidism 06/16/2017  . CKD (chronic kidney disease) stage 2, GFR 60-89 ml/min 03/21/2017  . Syncope, cardiogenic 03/20/2017  . Atrial fibrillation ([place]) 06/05/2015  . Essential hypertension 06/05/2015  . 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 Dysphagia 10/22/2011  . GERD (gastroesophageal reflux disease) 10/22/2011    Outpatient Encounter Medications as of 09/05/2017  Medication Sig  . celecoxib (CELEBREX) 200 MG capsule Take 200 mg by mouth daily as needed  for mild pain.   Marland Kitchen dofetilide (TIKOSYN) 500 MCG capsule Take 1 capsule (500 mcg total) by mouth 2 (two) times daily.  Marland Kitchen ELIQUIS 5 MG TABS tablet TAKE 1 TABLET BY MOUTH TWO  TIMES DAILY  . finasteride (PROSCAR) 5 MG tablet Take 5 mg by mouth daily.  . fluticasone (FLONASE) 50 MCG/ACT nasal spray Place 1 spray into both nostrils daily as needed for allergies.  Marland Kitchen gabapentin (NEURONTIN) 300 MG capsule Take 300 mg by mouth 2 (two) times daily.   Marland Kitchen levothyroxine (SYNTHROID, LEVOTHROID) 75 MCG tablet Take 1 tablet (75 mcg total) by mouth daily.  Marland Kitchen loratadine (CLARITIN) 10 MG tablet Take 10 mg by mouth daily as needed for allergies.  Marland Kitchen losartan (COZAAR) 50 MG tablet Take 1 tablet (50 mg total) by mouth daily.  . Melatonin 10 MG TABS Take 10 mg by mouth at bedtime.  . Omega-3 Fatty Acids (RA FISH OIL) 1400 MG CPDR Take 1,400 mg by mouth daily.  Vladimir Faster Glycol-Propyl Glycol (SYSTANE) 0.4-0.3 % SOLN Place 1 drop into both eyes 2 (two) times daily as needed (dry eyes).   . propranolol (INDERAL) 10 MG tablet Take 1 tablet (10 mg total) daily by mouth.  . ranitidine (ZANTAC) 150 MG tablet Take 150 mg by mouth 2 (two) times daily.   . Tamsulosin HCl (FLOMAX) 0.4 MG CAPS Take  0.4 mg by mouth daily after breakfast.   . Turmeric 500 MG CAPS Take 500 mg by mouth daily.  . vitamin B-12 (CYANOCOBALAMIN) 500 MCG tablet Take 1,000 mcg by mouth daily.   . [DISCONTINUED] ibuprofen (ADVIL,MOTRIN) 600 MG tablet Take 600 mg by mouth every 6 (six) hours as needed for moderate pain.   . [DISCONTINUED] losartan (COZAAR) 25 MG tablet Take 1 tablet (25 mg total) by mouth daily.  . cefUROXime (CEFTIN) 250 MG tablet Take 1 tablet (250 mg total) by mouth 2 (two) times daily with a meal.   No facility-administered encounter medications on file as of 09/05/2017.     Allergies  Allergen Reactions  . Tetanus Toxoids Swelling    Face swelling  . Statins Other (See Comments)    Hard to walk, severe leg cramps  . Doxycycline  Rash    Rash only  . Oxycodone Other (See Comments)    Severe constipation  . Penicillins Swelling and Rash    Took as a child, developed rash over time Has patient had a PCN reaction causing immediate rash, facial/tongue/throat swelling, SOB or lightheadedness with hypotension: Yes Has patient had a PCN reaction causing severe rash involving mucus membranes or skin necrosis: Unknown Has patient had a PCN reaction that required hospitalization: No Has patient had a PCN reaction occurring within the last 10 years: No If all of the above answers are "NO", then may proceed with Cephalosporin use.   . Sulfa Antibiotics Swelling and Rash  . Xarelto [Rivaroxaban] Rash    rash    Review of Systems  Constitutional: Positive for activity change and appetite change. Negative for chills and fever.       Feels ill  HENT: Positive for congestion, ear pain, rhinorrhea, sinus pressure and sinus pain. Negative for sore throat and trouble swallowing.   Eyes: Negative for redness and visual disturbance.  Respiratory: Negative for cough and shortness of breath.   Cardiovascular: Negative for chest pain and palpitations.  Gastrointestinal: Negative for constipation and diarrhea.  Genitourinary: Negative for difficulty urinating and frequency.  Musculoskeletal: Negative for neck pain.  Neurological: Positive for headaches.  Psychiatric/Behavioral: Positive for sleep disturbance.    BP (!) 180/108 (BP Location: Right Arm, Patient Position: Sitting, Cuff Size: Normal)   Pulse 70   Temp 97.8 F (36.6 C) (Temporal)   Resp 16   Ht 5\' 10"  (1.778 m)   Wt 198 lb 8 oz (90 kg)   SpO2 95%   BMI 28.48 kg/m   Physical Exam  Constitutional: He is oriented to person, place, and time. He appears well-developed. He appears ill.  Appears moderately tired  HENT:  Head: Normocephalic and atraumatic.  Mouth/Throat: Oropharynx is clear and moist.   Ethmoid sinuses are quite tender.  Nasal membranes are  congested.  Posterior pharynx clear  Eyes: EOM are normal. Pupils are equal, round, and reactive to light.  Neck: Normal range of motion. Neck supple.  Cardiovascular: Normal rate and normal heart sounds.  Pulmonary/Chest: Effort normal and breath sounds normal. He has no rales.  Abdominal: Soft. Bowel sounds are normal.  Lymphadenopathy:    He has no cervical adenopathy.  Neurological: He is alert and oriented to person, place, and time.  Psychiatric: He has a normal mood and affect. His behavior is normal.    ASSESSMENT/PLAN:  1. Acute recurrent maxillary sinusitis Discussed over-the-counter medicines and home care.  I am going to give him an antibiotic.  He is allergic to multiple antibiotics  including penicillin, sulfa, and doxycycline.  He does not not have a severe reaction to penicillin he reports he believes he has taken Keflex in the past.   Patient Instructions  Take the ceftin two times a day Continue with the flushing and staying hydrated  I am doubling the cozaar ( losartan) Take 50 mg a day  Continue to check your BP Let me know if the BP stays over 140/90    Raylene Everts, MD

## 2017-09-10 ENCOUNTER — Ambulatory Visit (HOSPITAL_COMMUNITY)
Admission: RE | Admit: 2017-09-10 | Discharge: 2017-09-10 | Disposition: A | Discharge status: Home / Self Care | Payer: Medicare Other | Source: Ambulatory Visit | Attending: Internal Medicine | Admitting: Internal Medicine

## 2017-09-10 ENCOUNTER — Encounter (HOSPITAL_COMMUNITY)
Admission: RE | Disposition: A | Discharge status: Home / Self Care | Payer: Self-pay | Source: Ambulatory Visit | Attending: Internal Medicine

## 2017-09-10 ENCOUNTER — Other Ambulatory Visit: Payer: Self-pay

## 2017-09-10 ENCOUNTER — Encounter (HOSPITAL_COMMUNITY): Payer: Self-pay | Admitting: *Deleted

## 2017-09-10 DIAGNOSIS — I4891 Unspecified atrial fibrillation: Secondary | ICD-10-CM | POA: Diagnosis not present

## 2017-09-10 DIAGNOSIS — Z7901 Long term (current) use of anticoagulants: Secondary | ICD-10-CM | POA: Insufficient documentation

## 2017-09-10 DIAGNOSIS — G629 Polyneuropathy, unspecified: Secondary | ICD-10-CM | POA: Insufficient documentation

## 2017-09-10 DIAGNOSIS — Z8546 Personal history of malignant neoplasm of prostate: Secondary | ICD-10-CM | POA: Insufficient documentation

## 2017-09-10 DIAGNOSIS — K210 Gastro-esophageal reflux disease with esophagitis: Secondary | ICD-10-CM | POA: Diagnosis not present

## 2017-09-10 DIAGNOSIS — K219 Gastro-esophageal reflux disease without esophagitis: Secondary | ICD-10-CM

## 2017-09-10 DIAGNOSIS — K222 Esophageal obstruction: Secondary | ICD-10-CM

## 2017-09-10 DIAGNOSIS — E039 Hypothyroidism, unspecified: Secondary | ICD-10-CM | POA: Diagnosis not present

## 2017-09-10 DIAGNOSIS — Z791 Long term (current) use of non-steroidal anti-inflammatories (NSAID): Secondary | ICD-10-CM | POA: Diagnosis not present

## 2017-09-10 DIAGNOSIS — Z79899 Other long term (current) drug therapy: Secondary | ICD-10-CM | POA: Diagnosis not present

## 2017-09-10 DIAGNOSIS — R1310 Dysphagia, unspecified: Secondary | ICD-10-CM | POA: Diagnosis not present

## 2017-09-10 DIAGNOSIS — Z96661 Presence of right artificial ankle joint: Secondary | ICD-10-CM | POA: Insufficient documentation

## 2017-09-10 DIAGNOSIS — I10 Essential (primary) hypertension: Secondary | ICD-10-CM | POA: Diagnosis not present

## 2017-09-10 DIAGNOSIS — K449 Diaphragmatic hernia without obstruction or gangrene: Secondary | ICD-10-CM | POA: Insufficient documentation

## 2017-09-10 HISTORY — PX: MALONEY DILATION: SHX5535

## 2017-09-10 HISTORY — PX: ESOPHAGOGASTRODUODENOSCOPY: SHX5428

## 2017-09-10 SURGERY — EGD (ESOPHAGOGASTRODUODENOSCOPY)
Anesthesia: Moderate Sedation

## 2017-09-10 MED ORDER — LIDOCAINE VISCOUS 2 % MT SOLN
OROMUCOSAL | Status: DC | PRN
  Administered 2017-09-10: 4 mL via OROMUCOSAL

## 2017-09-10 MED ORDER — STERILE WATER FOR IRRIGATION IR SOLN
Status: DC | PRN
  Administered 2017-09-10: 11:00:00

## 2017-09-10 MED ORDER — MIDAZOLAM HCL 5 MG/5ML IJ SOLN
INTRAMUSCULAR | Status: DC | PRN
  Administered 2017-09-10: 2 mg via INTRAVENOUS
  Administered 2017-09-10: 1 mg via INTRAVENOUS

## 2017-09-10 MED ORDER — ONDANSETRON HCL 4 MG/2ML IJ SOLN
INTRAMUSCULAR | Status: DC | PRN
Start: 2017-09-10 — End: 2017-09-10
  Administered 2017-09-10: 4 mg via INTRAVENOUS

## 2017-09-10 MED ORDER — LIDOCAINE VISCOUS 2 % MT SOLN
OROMUCOSAL | Status: AC
  Filled 2017-09-10: qty 15

## 2017-09-10 MED ORDER — MEPERIDINE HCL 100 MG/ML IJ SOLN
INTRAMUSCULAR | Status: DC | PRN
  Administered 2017-09-10: 25 mg via INTRAVENOUS
  Administered 2017-09-10: 50 mg via INTRAVENOUS

## 2017-09-10 MED ORDER — MEPERIDINE HCL 100 MG/ML IJ SOLN
INTRAMUSCULAR | Status: AC
  Filled 2017-09-10: qty 2

## 2017-09-10 MED ORDER — MIDAZOLAM HCL 5 MG/5ML IJ SOLN
INTRAMUSCULAR | Status: AC
  Filled 2017-09-10: qty 10

## 2017-09-10 MED ORDER — SODIUM CHLORIDE 0.9 % IV SOLN
INTRAVENOUS | Status: DC
  Administered 2017-09-10: 1000 mL via INTRAVENOUS

## 2017-09-10 MED ORDER — ONDANSETRON HCL 4 MG/2ML IJ SOLN
INTRAMUSCULAR | Status: AC
  Filled 2017-09-10: qty 2

## 2017-09-10 NOTE — Discharge Instructions (Signed)
EGD Discharge instructions Please read the instructions outlined below and refer to this sheet in the next few weeks. These discharge instructions provide you with general information on caring for yourself after you leave the hospital. Your doctor may also give you specific instructions. While your treatment has been planned according to the most current medical practices available, unavoidable complications occasionally occur. If you have any problems or questions after discharge, please call your doctor. ACTIVITY  You may resume your regular activity but move at a slower pace for the next 24 hours.   Take frequent rest periods for the next 24 hours.   Walking will help expel (get rid of) the air and reduce the bloated feeling in your abdomen.   No driving for 24 hours (because of the anesthesia (medicine) used during the test).   You may shower.   Do not sign any important legal documents or operate any machinery for 24 hours (because of the anesthesia used during the test).  NUTRITION  Drink plenty of fluids.   You may resume your normal diet.   Begin with a light meal and progress to your normal diet.   Avoid alcoholic beverages for 24 hours or as instructed by your caregiver.  MEDICATIONS  You may resume your normal medications unless your caregiver tells you otherwise.  WHAT YOU CAN EXPECT TODAY  You may experience abdominal discomfort such as a feeling of fullness or gas pains.  FOLLOW-UP  Your doctor will discuss the results of your test with you.  SEEK IMMEDIATE MEDICAL ATTENTION IF ANY OF THE FOLLOWING OCCUR:  Excessive nausea (feeling sick to your stomach) and/or vomiting.   Severe abdominal pain and distention (swelling).   Trouble swallowing.   Temperature over 101 F (37.8 C).   Rectal bleeding or vomiting of blood.    GERD information provided  Stop Zantac/ranitidine  Begin Protonix 40 mg daily.  Resume Eliquis tomorrow.  Office visit with [place]  in 6 months   Gastroesophageal Reflux Disease, Adult Normally, food travels down the esophagus and stays in the stomach to be digested. However, when a person has gastroesophageal reflux disease (GERD), food and stomach acid move back up into the esophagus. When this happens, the esophagus becomes sore and inflamed. Over time, GERD can create small holes (ulcers) in the lining of the esophagus. What are the causes? This condition is caused by a problem with the muscle between the esophagus and the stomach (lower esophageal sphincter, or LES). Normally, the LES muscle closes after food passes through the esophagus to the stomach. When the LES is weakened or abnormal, it does not close properly, and that allows food and stomach acid to go back up into the esophagus. The LES can be weakened by certain dietary substances, medicines, and medical conditions, including:  Tobacco use.  Pregnancy.  Having a hiatal hernia.  Heavy alcohol use.  Certain foods and beverages, such as coffee, chocolate, onions, and peppermint.  What increases the risk? This condition is more likely to develop in:  People who have an increased body weight.  People who have connective tissue disorders.  People who use NSAID medicines.  What are the signs or symptoms? Symptoms of this condition include:  Heartburn.  Difficult or painful swallowing.  The feeling of having a lump in the throat.  Abitter taste in the mouth.  Bad breath.  Having a large amount of saliva.  Having an upset or bloated stomach.  Belching.  Chest pain.  Shortness of breath  or wheezing.  Ongoing (chronic) cough or a night-time cough.  Wearing away of tooth enamel.  Weight loss.  Different conditions can cause chest pain. Make sure to see your health care provider if you experience chest pain. How is this diagnosed? Your health care provider will take a medical history and perform a physical exam. To determine if you  have mild or severe GERD, your health care provider may also monitor how you respond to treatment. You may also have other tests, including:  An endoscopy toexamine your stomach and esophagus with a small camera.  A test thatmeasures the acidity level in your esophagus.  A test thatmeasures how much pressure is on your esophagus.  A barium swallow or modified barium swallow to show the shape, size, and functioning of your esophagus.  How is this treated? The goal of treatment is to help relieve your symptoms and to prevent complications. Treatment for this condition may vary depending on how severe your symptoms are. Your health care provider may recommend:  Changes to your diet.  Medicine.  Surgery.  Follow these instructions at home: Diet  Follow a diet as recommended by your health care provider. This may involve avoiding foods and drinks such as: ? Coffee and tea (with or without caffeine). ? Drinks that containalcohol. ? Energy drinks and sports drinks. ? Carbonated drinks or sodas. ? Chocolate and cocoa. ? Peppermint and mint flavorings. ? Garlic and onions. ? Horseradish. ? Spicy and acidic foods, including peppers, chili powder, curry powder, vinegar, hot sauces, and barbecue sauce. ? Citrus fruit juices and citrus fruits, such as oranges, lemons, and limes. ? Tomato-based foods, such as red sauce, chili, salsa, and pizza with red sauce. ? Fried and fatty foods, such as donuts, french fries, potato chips, and high-fat dressings. ? High-fat meats, such as hot dogs and fatty cuts of red and white meats, such as rib eye steak, sausage, ham, and bacon. ? High-fat dairy items, such as whole milk, butter, and cream cheese.  Eat small, frequent meals instead of large meals.  Avoid drinking large amounts of liquid with your meals.  Avoid eating meals during the 2-3 hours before bedtime.  Avoid lying down right after you eat.  Do not exercise right after you  eat. General instructions  Pay attention to any changes in your symptoms.  Take over-the-counter and prescription medicines only as told by your health care provider. Do not take aspirin, ibuprofen, or other NSAIDs unless your health care provider told you to do so.  Do not use any tobacco products, including cigarettes, chewing tobacco, and e-cigarettes. If you need help quitting, ask your health care provider.  Wear loose-fitting clothing. Do not wear anything tight around your waist that causes pressure on your abdomen.  Raise (elevate) the head of your bed 6 inches (15cm).  Try to reduce your stress, such as with yoga or meditation. If you need help reducing stress, ask your health care provider.  If you are overweight, reduce your weight to an amount that is healthy for you. Ask your health care provider for guidance about a safe weight loss goal.  Keep all follow-up visits as told by your health care provider. This is important. Contact a health care provider if:  You have new symptoms.  You have unexplained weight loss.  You have difficulty swallowing, or it hurts to swallow.  You have wheezing or a persistent cough.  Your symptoms do not improve with treatment.  You have a hoarse  voice. Get help right away if:  You have pain in your arms, neck, jaw, teeth, or back.  You feel sweaty, dizzy, or light-headed.  You have chest pain or shortness of breath.  You vomit and your vomit looks like blood or coffee grounds.  You faint.  Your stool is bloody or black.  You cannot swallow, drink, or eat. This information is not intended to replace advice given to you by your health care provider. Make sure you discuss any questions you have with your health care provider. Document Released: 05/01/2005 Document Revised: 12/20/2015 Document Reviewed: 11/16/2014 Elsevier Interactive Patient Education  [employer].

## 2017-09-10 NOTE — Interval H&P Note (Signed)
History and Physical Interval Note:  09/10/2017 10:58 AM  Brent Wilkins  has presented today for surgery, with the diagnosis of DYSPHAGIA, GERD  The various methods of treatment have been discussed with the patient and family. After consideration of risks, benefits and other options for treatment, the patient has consented to  Procedure(s) with comments: ESOPHAGOGASTRODUODENOSCOPY (EGD) (N/A) - 12:30PM MALONEY DILATION (N/A) as a surgical intervention .  The patient's history has been reviewed, patient examined, no change in status, stable for surgery.  I have reviewed the patient's chart and labs.  Questions were answered to the patient's satisfaction.     Brent Wilkins  No change. L

## 2017-09-10 NOTE — Interval H&P Note (Signed)
History and Physical Interval Note:  09/10/2017 10:59 AM  Brent Wilkins  has presented today for surgery, with the diagnosis of DYSPHAGIA, GERD  The various methods of treatment have been discussed with the patient and family. After consideration of risks, benefits and other options for treatment, the patient has consented to  Procedure(s) with comments: ESOPHAGOGASTRODUODENOSCOPY (EGD) (N/A) - 12:30PM MALONEY DILATION (N/A) as a surgical intervention .  The patient's history has been reviewed, patient examined, no change in status, stable for surgery.  I have reviewed the patient's chart and labs.  Questions were answered to the patient's satisfaction.     [name]  No change. L was held

## 2017-09-10 NOTE — Op Note (Signed)
[facility] Patient Name: Brent Wilkins Procedure Date: 09/10/2017 10:48 AM MRN: [id number] Date of Birth: Jul 22, 1941 Attending MD: Norvel Richards , MD CSN: [id number] Age: 77 Admit Type: Outpatient Procedure:                Upper GI endoscopy Indications:              Dysphagia Providers:                Norvel Richards, MD, Jeanann Lewandowsky. Sharon Seller, RN,                            Randa Spike, Technician Referring MD:             Lysle Morales Medicines:                Midazolam 3 mg IV, Meperidine 75 mg IV, Ondansetron                            4 mg IV Complications:            No immediate complications. Estimated Blood Loss:     Estimated blood loss: Minimal. Procedure:                Pre-Anesthesia Assessment:                           - Prior to the procedure, a History and Physical                            was performed, and patient medications and                            allergies were reviewed. The patient's tolerance of                            previous anesthesia was also reviewed. The risks                            and benefits of the procedure and the sedation                            options and risks were discussed with the patient.                            All questions were answered, and informed consent                            was obtained. Prior Anticoagulants: The patient                            last took Eliquis (apixaban) 3 days prior to the                            procedure. ASA Grade Assessment: III - A patient  with severe systemic disease. After reviewing the                            risks and benefits, the patient was deemed in                            satisfactory condition to undergo the procedure.                           After obtaining informed consent, the endoscope was                            passed under direct vision. Throughout the                            procedure, the patient's  blood pressure, pulse, and                            oxygen saturations were monitored continuously. The                            EG-[id number] ([id number]) scope was introduced through the                            mouth, and advanced to the second part of duodenum.                            The upper GI endoscopy was accomplished without                            difficulty. The patient tolerated the procedure                            well. Scope In: 11:09:30 AM Scope Out: 11:15:24 AM Total Procedure Duration: 0 hours 5 minutes 54 seconds  Findings:      A moderate Schatzki ring (acquired) was found at the gastroesophageal       junction. Mild reflux esophagitis found. No Barrett's epithelium or       tumor.      A medium-sized hiatal hernia was present.      The duodenal bulb and second portion of the duodenum were normal. The       scope was withdrawn. Dilation was performed with a Maloney dilator with       mild resistance at 56 Fr. The scope was withdrawn. Dilation was       performed with a Maloney dilator with mild resistance at 51 Fr. The       dilation site was examined following endoscope reinsertion and showed       breathing nicely disrupted without apparent complication.. Impression:               - Moderate Schatzki ring. Dilated. Mild erosive                            reflux esophagitis.                           -  Medium-sized hiatal hernia.                           - Normal duodenal bulb and second portion of the                            duodenum.                           - No specimens collected. Moderate Sedation:      Moderate (conscious) sedation was administered by the endoscopy nurse       and supervised by the endoscopist. The following parameters were       monitored: oxygen saturation, heart rate, blood pressure, respiratory       rate, EKG, adequacy of pulmonary ventilation, and response to care.       Total physician intraservice time was 13  minutes. Recommendation:           - Patient has a contact number available for                            emergencies. The signs and symptoms of potential                            delayed complications were discussed with the                            patient. Return to normal activities tomorrow.                            Written discharge instructions were provided to the                            patient.                           - Advance diet as tolerated. Stop ranitidine. Begin                            Protonix 40 mg daily. Office visit with [place] in 6                            months. Resume Eliquis tomorrow.                           - Continue present medications. Procedure Code(s):        --- Professional ---                           [phone], Esophagogastroduodenoscopy, flexible,                            transoral; diagnostic, including collection of                            specimen(s) by brushing or washing, when performed                            (  separate procedure)                           43450, Dilation of esophagus, by unguided sound or                            bougie, single or multiple passes                           99152, Moderate sedation services provided by the                            same physician or other qualified health care                            professional performing the diagnostic or                            therapeutic service that the sedation supports,                            requiring the presence of an independent trained                            observer to assist in the monitoring of the                            patient's level of consciousness and physiological                            status; initial 15 minutes of intraservice time,                            patient age 37 years or older Diagnosis Code(s):        --- Professional ---                           K22.2, Esophageal obstruction                            K44.9, Diaphragmatic hernia without obstruction or                            gangrene                           R13.10, Dysphagia, unspecified CPT copyright 2016 American Medical Association. All rights reserved. The codes documented in this report are preliminary and upon coder review may  be revised to meet current compliance requirements. Cristopher Estimable. Landy Dunnavant, MD Norvel Richards, MD 09/10/2017 11:28:00 AM This report has been signed electronically. Number of Addenda: 0

## 2017-09-10 NOTE — Interval H&P Note (Signed)
History and Physical Interval Note:  09/10/2017 10:59 AM  Brent Wilkins  has presented today for surgery, with the diagnosis of DYSPHAGIA, GERD  The various methods of treatment have been discussed with the patient and family. After consideration of risks, benefits and other options for treatment, the patient has consented to  Procedure(s) with comments: ESOPHAGOGASTRODUODENOSCOPY (EGD) (N/A) - 12:30PM MALONEY DILATION (N/A) as a surgical intervention .  The patient's history has been reviewed, patient examined, no change in status, stable for surgery.  I have reviewed the patient's chart and labs.  Questions were answered to the patient's satisfaction.     [name]  No change. The risks, benefits, limitations, alternatives and imponderables have been reviewed with the patient. Potential for esophageal dilation, biopsy, etc. have also been reviewed.  Questions have been answered. All parties agreeable.

## 2017-09-12 ENCOUNTER — Encounter (HOSPITAL_COMMUNITY): Payer: Self-pay | Admitting: Internal Medicine

## 2017-09-19 ENCOUNTER — Telehealth: Payer: Self-pay | Admitting: Family Medicine

## 2017-09-19 NOTE — Telephone Encounter (Signed)
Patient is calling in to let you know he is still having bad headaches.   Cb#: [phone]

## 2017-09-19 NOTE — Telephone Encounter (Signed)
Please call patient for clarification.  When he came in last time he had a sinus infection, drainage, allergies, and headache.  I treated him for the sinus infection and allergies.  Does he just have a headache, or did his sinus infection not clear up

## 2017-09-19 NOTE — Telephone Encounter (Signed)
Brent Wilkins is just c/o head pain behind his eyes.

## 2017-09-19 NOTE — Telephone Encounter (Signed)
I called Brent Wilkins.  He is continuing to have headache behind his eyes is pretty significant.  It is a "8" on a scale of 1-10.  He does not have nausea or vomiting.  He states that his vision feels a little impaired but he really can still focus and see.  He does not have photophobia.  Certainly no change in mentation, weakness, balance issues, neurologic signs.  He is never had headaches before, however,.  He states it really still feels like sinus.  I encouraged him to continue use the Flonase twice a day.  Continue the nasal rinses.  Take Tylenol for pain.  I offered him stronger pain medicine which he refuses.  If he gets acutely worse over the weekend he is good to go the emergency room.  I did inform him that I am caring the call pager this week and he can call me for any questions.  Otherwise I will check with him on Monday to see how he is doing

## 2017-09-19 NOTE — Telephone Encounter (Signed)
Brent Wilkins talked to the pt, no signs of sinus infection, he is only having headache behind his eyes.

## 2017-09-22 ENCOUNTER — Encounter: Payer: Self-pay | Admitting: Internal Medicine

## 2017-09-22 ENCOUNTER — Ambulatory Visit: Payer: Medicare Other | Admitting: Internal Medicine

## 2017-09-22 ENCOUNTER — Other Ambulatory Visit (HOSPITAL_COMMUNITY)
Admission: RE | Admit: 2017-09-22 | Discharge: 2017-09-22 | Disposition: A | Discharge status: Home / Self Care | Payer: Medicare Other | Source: Ambulatory Visit | Attending: Internal Medicine | Admitting: Internal Medicine

## 2017-09-22 VITALS — BP 162/100 | HR 76 | Ht 71.0 in | Wt 201.0 lb

## 2017-09-22 DIAGNOSIS — I480 Paroxysmal atrial fibrillation: Secondary | ICD-10-CM

## 2017-09-22 DIAGNOSIS — R55 Syncope and collapse: Secondary | ICD-10-CM

## 2017-09-22 DIAGNOSIS — R42 Dizziness and giddiness: Secondary | ICD-10-CM

## 2017-09-22 DIAGNOSIS — I10 Essential (primary) hypertension: Secondary | ICD-10-CM | POA: Diagnosis not present

## 2017-09-22 LAB — BASIC METABOLIC PANEL
Anion gap: 8 (ref 5–15)
BUN: 18 mg/dL (ref 6–20)
CALCIUM: 9.3 mg/dL (ref 8.9–10.3)
CO2: 26 mmol/L (ref 22–32)
CREATININE: 1.15 mg/dL (ref 0.61–1.24)
Chloride: 107 mmol/L (ref 101–111)
GFR calc Af Amer: >60 mL/min (ref >60)
GFR, EST NON AFRICAN AMERICAN: 59 mL/min — AB (ref >60)
GLUCOSE: 147 mg/dL — AB (ref 65–99)
Potassium: 4 mmol/L (ref 3.5–5.1)
Sodium: 141 mmol/L (ref 135–145)

## 2017-09-22 LAB — CBC
HCT: 43.8 % (ref 39.0–52.0)
Hemoglobin: 14.6 g/dL (ref 13.0–17.0)
MCH: 30.9 pg (ref 26.0–34.0)
MCHC: 33.3 g/dL (ref 30.0–36.0)
MCV: 92.6 fL (ref 78.0–100.0)
Platelets: 183 10*3/uL (ref 150–400)
RBC: 4.73 MIL/uL (ref 4.22–5.81)
RDW: 13.2 % (ref 11.5–15.5)
WBC: 6.7 10*3/uL (ref 4.0–10.5)

## 2017-09-22 LAB — LIPID PANEL
CHOLESTEROL: 162 mg/dL (ref 0–200)
HDL: 49 mg/dL (ref >40)
LDL Cholesterol: 83 mg/dL (ref 0–99)
TRIGLYCERIDES: 151 mg/dL — AB (ref <150)
Total CHOL/HDL Ratio: 3.3 RATIO
VLDL: 30 mg/dL (ref 0–40)

## 2017-09-22 NOTE — Progress Notes (Signed)
Cardiology Office Note   Date:  09/22/2017   ID:  Brent Wilkins, Brent Wilkins May 14, 1941, MRN [id number]  PCP:  Raylene Everts, MD  Cardiologist:   Dorris Carnes, MD   F/U of afib     History of Present Illness: Brent Wilkins is a 77 y.o. male with a history of syncope and positive tilt tabale  Also HL , PAF  CHASVASc 3.   Holter  Average HR 67  No significant pauses    On Inderal decreased to 10 mg per day due to dizziness  I asw the pt in 2017s   He was seen by Arnold Long after in 2018  Developed persistent atrial fib  Seen by Maximino Greenland  Admitted forTikosyn load   Pt had a history of syncope in August  Etiolgy not clear   I saw the pt last fall Since seen he denies palpitations  Except for a sinus infection he has been breathing good  No chest tightness  No dizziness  He did have a spell earlier this winter when water got stuck in esophagus. Almost passed out  He had signficant discomfort.at the time.    It reminded him of spell of syncope last summer   When he reflects back to that time, he says  a hot dog got stuck in throat Painfult He has since had esophagus dilated   Swallwoing is better    BP has been a problem  Primary increased meds  He did not take yet today but says BP has been in 150s at home      Outpatient Medications Prior to Visit  Medication Sig Dispense Refill  . cefUROXime (CEFTIN) 250 MG tablet Take 1 tablet (250 mg total) by mouth 2 (two) times daily with a meal. 20 tablet 1  . celecoxib (CELEBREX) 200 MG capsule Take 200 mg by mouth daily as needed for mild pain.     Marland Kitchen dofetilide (TIKOSYN) 500 MCG capsule Take 1 capsule (500 mcg total) by mouth 2 (two) times daily. 180 capsule 1  . ELIQUIS 5 MG TABS tablet TAKE 1 TABLET BY MOUTH TWO  TIMES DAILY 180 tablet 3  . finasteride (PROSCAR) 5 MG tablet Take 5 mg by mouth daily.    . fluticasone (FLONASE) 50 MCG/ACT nasal spray Place 1 spray into both nostrils daily as needed for allergies. 48 g 3  . gabapentin  (NEURONTIN) 300 MG capsule Take 300 mg by mouth 2 (two) times daily.     Marland Kitchen levothyroxine (SYNTHROID, LEVOTHROID) 75 MCG tablet Take 1 tablet (75 mcg total) by mouth daily. 90 tablet 3  . loratadine (CLARITIN) 10 MG tablet Take 10 mg by mouth daily as needed for allergies.    Marland Kitchen losartan (COZAAR) 50 MG tablet Take 1 tablet (50 mg total) by mouth daily. 90 tablet 3  . Melatonin 10 MG TABS Take 10 mg by mouth at bedtime.    . Omega-3 Fatty Acids (RA FISH OIL) 1400 MG CPDR Take 1,400 mg by mouth daily.    . pantoprazole (PROTONIX) 40 MG tablet Take 40 mg by mouth daily.  0  . Polyethyl Glycol-Propyl Glycol (SYSTANE) 0.4-0.3 % SOLN Place 1 drop into both eyes 2 (two) times daily as needed (dry eyes).     . propranolol (INDERAL) 10 MG tablet Take 1 tablet (10 mg total) daily by mouth. 90 tablet 3  . Tamsulosin HCl (FLOMAX) 0.4 MG CAPS Take 0.4 mg by mouth daily after breakfast.     .  Turmeric 500 MG CAPS Take 500 mg by mouth daily.    . vitamin B-12 (CYANOCOBALAMIN) 500 MCG tablet Take 1,000 mcg by mouth daily.     . ranitidine (ZANTAC) 150 MG tablet Take 150 mg by mouth 2 (two) times daily.      No facility-administered medications prior to visit.      Allergies:   Tetanus toxoids; Statins; Doxycycline; Oxycodone; Penicillins; Sulfa antibiotics; and Xarelto [rivaroxaban]   Past Medical History:  Diagnosis Date  . Allergy    hay fever  . Arthritis    right ankle  . Cataract    surgery 2016  . Chronic kidney disease    stones many years ago  . Dizziness 12/13/2015  . Dysphagia   . Frequent urination at night   . GERD (gastroesophageal reflux disease)   . History of kidney stones   . HTN (hypertension)   . Hypothyroidism   . 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 Neuropathy    Bilateral ankles  . Polyneuropathy 06/16/2017  . Prostate cancer [facility]) 2010   Dr. Rosana Hoes  . Schatzki's ring 10/22/2011  . Seasonal allergies   . Syncope  dx. 8 yrs ago    Past Surgical History:  Procedure  Laterality Date  . APPENDECTOMY    . CATARACT EXTRACTION W/PHACO Left 03/27/2015   Procedure: CATARACT EXTRACTION PHACO AND INTRAOCULAR LENS PLACEMENT LEFT EYE CDE=11.73;  Surgeon: Tonny Branch, MD;  Location: [facility];  Service: Ophthalmology;  Laterality: Left;  . CATARACT EXTRACTION W/PHACO Right 03/30/2015   Procedure: CATARACT EXTRACTION PHACO AND INTRAOCULAR LENS PLACEMENT RIGHT EYE CDE=7.35;  Surgeon: Tonny Branch, MD;  Location: [facility];  Service: Ophthalmology;  Laterality: Right;  . CHOLECYSTECTOMY    . COLONOSCOPY  07/23/10   Dr. Vivi Ferns rectum, scattered pancolonic diverticula  . COLONOSCOPY  08/10/1999   internal hemorrhoids,inflammatory polyp  . ESOPHAGOGASTRODUODENOSCOPY  04/12/08   Prominent Schatzki's ring, erosive reflux esophagitis, multiple antral erosions, small hiatal hernia, reactive gastropathy, status post dilation with 70F  . ESOPHAGOGASTRODUODENOSCOPY  10/28/2011   Procedure: ESOPHAGOGASTRODUODENOSCOPY (EGD);  Surgeon: Daneil Dolin, MD;  Location: [facility];  Service: Endoscopy;  Laterality: N/A;  1:30  . ESOPHAGOGASTRODUODENOSCOPY N/A 09/10/2017   Procedure: ESOPHAGOGASTRODUODENOSCOPY (EGD);  Surgeon: Daneil Dolin, MD;  Location: [facility];  Service: Endoscopy;  Laterality: N/A;  12:30PM  . EYE SURGERY    . MALONEY DILATION  10/28/2011   Procedure: Venia Minks DILATION;  Surgeon: Daneil Dolin, MD;  [facility]: [facility];  Service: Endoscopy;  Laterality: N/A;  . Venia Minks DILATION N/A 09/10/2017   Procedure: Venia Minks DILATION;  Surgeon: Daneil Dolin, MD;  Location: [facility];  Service: Endoscopy;  Laterality: N/A;  . SAVORY DILATION  10/28/2011   Procedure: SAVORY DILATION;  Surgeon: Daneil Dolin, MD;  Location: [facility];  Service: Endoscopy;  Laterality: N/A;  . TONSILLECTOMY    . TOTAL ANKLE ARTHROPLASTY Left 11/25/2013   Procedure: TOTAL ANKLE ARTHOPLASTY;  Surgeon: Wylene Simmer, MD;  Location: [facility];  Service: Orthopedics;  Laterality: Left;  . TOTAL  ANKLE ARTHROPLASTY Right 07/14/2014   dr hewitt  . TOTAL ANKLE ARTHROPLASTY Right 07/14/2014   Procedure: RIGHT TOTAL ANKLE ARTHOPLASTY;  Surgeon: Wylene Simmer, MD;  Location: [place];  Service: Orthopedics;  Laterality: Right;     Social History:  The patient  reports that  has never smoked. he has never used smokeless tobacco. He reports that he drinks about 0.6 - 1.2  oz of alcohol per week. He reports that he does not use drugs.   Family History:  The patient's family history includes ALS in his son; Arthritis in his mother; Cancer in his brother and father; Cancer (age of onset: 81) in his son; Heart disease in his mother; Hypertension in his father; Prostate cancer in his brother and father; Prostate cancer (age of onset: 39) in his son.    ROS:  Please see the history of present illness. All other systems are reviewed and  Negative to the above problem except as noted.    PHYSICAL EXAM: VS:  BP (!) 162/100   Pulse 76   Ht 5\' 11"  (1.803 m)   Wt 201 lb (91.2 kg)   SpO2 97%   BMI 28.03 kg/m   GEN: Well nourished, well developed, in no acute distress  HEENT: normal  Neck: JVP normal  No carotid bruits, or masses Cardiac: RRR; no murmurs, rubs, or gallops,no edema  Respiratory:  clear to auscultation bilaterally, normal work of breathing GI: soft, nontender, nondistended, + BS  No hepatomegaly  MS: no deformity Moving all extremities   Skin: warm and dry, no rash Neuro:  Strength and sensation are intact Psych: euthymic mood, full affect   EKG:  EKG is not ordered today.   Lipid Panel    Component Value Date/Time   CHOL 141 11/14/2016 0928   TRIG 85 11/14/2016 0928   HDL 50 11/14/2016 0928   CHOLHDL 2.8 11/14/2016 0928   VLDL 17 11/14/2016 0928   LDLCALC 74 11/14/2016 0928      Wt Readings from Last 3 Encounters:  09/22/17 201 lb (91.2 kg)  09/10/17 193 lb 8 oz (87.8 kg)  09/05/17 198 lb 8 oz (90 kg)      ASSESSMENT AND PLAN:  1.  Afib  Currently on Tikosyn  and Xarelto   Remains in SR    2  Syncope  Sounds like vagal response due to pain as noted by spell above  3  HTN  BP is out of control because he has not taken meds yet  But at home not in control  150s    Losartan was increased by primary  Will check labs  Get renal USN   F/U with pt re meds and changes  Tentative f/u in 6 wks    4 Dizziness   Denies    5  Sinus infection  Will check with M Supple  Current ABX not helping  He has been on it for awhile   He is allergic to PCN  Note rash with Doxy  May need to Defer to ID ad pharmacy     Signed, Dorris Carnes, MD  09/22/2017 9:48 AM    [address], [place], [place]  [address] Phone: [phone]; Fax: [phone]

## 2017-09-22 NOTE — Patient Instructions (Addendum)
Your physician recommends that you schedule a follow-up appointment in: 6 weeks with Bernerd Pho PA-C   Your physician has requested that you have a renal artery duplex. During this test, an ultrasound is used to evaluate blood flow to the kidneys. Allow one hour for this exam. Do not eat after midnight the day before and avoid carbonated beverages. Take your medications as you usually do.  Get Lab work : CBC,BMET, Lipids   Your physician recommends that you continue on your current medications as directed. Please refer to the Current Medication list given to you today.      Thank you for choosing [facility] !

## 2017-09-23 ENCOUNTER — Other Ambulatory Visit: Payer: Self-pay

## 2017-09-23 NOTE — Telephone Encounter (Signed)
Called and spoke to jim, states he still has a head ache, but it is improving. He feels it is his sinuses. Offered an appt. But would like to wait another day or 2, he will call if he wants to be seen. fyi had labs from cardiology yesterday.

## 2017-09-25 ENCOUNTER — Other Ambulatory Visit: Payer: Self-pay

## 2017-09-25 MED ORDER — LOSARTAN POTASSIUM 50 MG PO TABS: 50.0000 mg | ORAL_TABLET | Freq: Every day | ORAL | 0 refills | Status: DC

## 2017-10-08 ENCOUNTER — Ambulatory Visit (INDEPENDENT_AMBULATORY_CARE_PROVIDER_SITE_OTHER): Payer: Medicare Other

## 2017-10-08 DIAGNOSIS — I10 Essential (primary) hypertension: Secondary | ICD-10-CM

## 2017-10-15 ENCOUNTER — Ambulatory Visit (INDEPENDENT_AMBULATORY_CARE_PROVIDER_SITE_OTHER): Payer: Medicare Other | Admitting: Family Medicine

## 2017-10-15 ENCOUNTER — Telehealth: Payer: Self-pay

## 2017-10-15 ENCOUNTER — Encounter: Payer: Self-pay | Admitting: Family Medicine

## 2017-10-15 VITALS — BP 124/82 | HR 70 | Temp 97.1°F | Ht 71.0 in | Wt 205.0 lb

## 2017-10-15 DIAGNOSIS — R51 Headache: Secondary | ICD-10-CM | POA: Diagnosis not present

## 2017-10-15 DIAGNOSIS — J321 Chronic frontal sinusitis: Secondary | ICD-10-CM | POA: Diagnosis not present

## 2017-10-15 DIAGNOSIS — R519 Headache, unspecified: Secondary | ICD-10-CM

## 2017-10-15 DIAGNOSIS — J3489 Other specified disorders of nose and nasal sinuses: Secondary | ICD-10-CM

## 2017-10-15 MED ORDER — PREDNISONE 20 MG PO TABS: ORAL_TABLET | ORAL | 0 refills | Status: DC

## 2017-10-15 NOTE — Telephone Encounter (Signed)
Called patient and advised that CT scan was scheduled for tomorrow and to arrive at 11:45. Patient wife took message, and had no questions or concerns at this time.

## 2017-10-15 NOTE — Progress Notes (Signed)
Chief Complaint  Patient presents with  . Acute Visit    2 months, still drainage in the morning, headaches   Patient has had what he calls a "sinus infection" since the middle of January.  I saw him on 09/05/17 for visit.  At that point he had postnasal drip, sinus pressure and pain around his eyes and cheeks, yellow drainage.  Sore throat ear pain headache.  He was using over-the-counter Flonase.  He was using a Nettie pot to rinse his sinuses.  He took a daily antihistamine.  Because he was not improving I treated him with antibiotics, Ceftin. He states he did feel a little better but continues to have pressure and pain around his sinuses and head.  He has terrible headaches.  He is still using his allergy medications.  When he rinses his sinuses with a Nettie pot he gets some thick bloody discharge. I am concerned that his persistence of symptoms.  If this is just allergies, he should feel better with a antihistamine and Flonase.  He does not really have symptoms of persistent infection.  We discussed CT sinuses and or referral to ENT for additional testing.  Patient Active Problem List   Diagnosis Date Noted  . History of prostate cancer 06/16/2017  . Benign essential tremor 06/16/2017  . Polyneuropathy 06/16/2017  . Hypothyroidism 06/16/2017  . CKD (chronic kidney disease) stage 2, GFR 60-89 ml/min 03/21/2017  . Syncope, cardiogenic 03/20/2017  . Atrial fibrillation ([place]) 06/05/2015  . Essential hypertension 06/05/2015  . 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 Dysphagia 10/22/2011  . GERD (gastroesophageal reflux disease) 10/22/2011    Outpatient Encounter Medications as of 10/15/2017  Medication Sig  . celecoxib (CELEBREX) 200 MG capsule Take 200 mg by mouth daily as needed for mild pain.   Marland Kitchen dofetilide (TIKOSYN) 500 MCG capsule Take 1 capsule (500 mcg total) by mouth 2 (two) times daily.  Marland Kitchen ELIQUIS 5 MG TABS tablet TAKE 1 TABLET BY MOUTH TWO  TIMES DAILY  .  finasteride (PROSCAR) 5 MG tablet Take 5 mg by mouth daily.  . fluticasone (FLONASE) 50 MCG/ACT nasal spray Place 1 spray into both nostrils daily as needed for allergies.  Marland Kitchen gabapentin (NEURONTIN) 300 MG capsule Take 300 mg by mouth 2 (two) times daily.   Marland Kitchen levothyroxine (SYNTHROID, LEVOTHROID) 75 MCG tablet Take 1 tablet (75 mcg total) by mouth daily.  Marland Kitchen loratadine (CLARITIN) 10 MG tablet Take 10 mg by mouth daily as needed for allergies.  Marland Kitchen losartan (COZAAR) 50 MG tablet Take 1 tablet (50 mg total) by mouth daily.  . Melatonin 10 MG TABS Take 10 mg by mouth at bedtime.  . Omega-3 Fatty Acids (RA FISH OIL) 1400 MG CPDR Take 1,400 mg by mouth daily.  . pantoprazole (PROTONIX) 40 MG tablet Take 40 mg by mouth daily.  Vladimir Faster Glycol-Propyl Glycol (SYSTANE) 0.4-0.3 % SOLN Place 1 drop into both eyes 2 (two) times daily as needed (dry eyes).   . propranolol (INDERAL) 10 MG tablet Take 1 tablet (10 mg total) daily by mouth.  . Tamsulosin HCl (FLOMAX) 0.4 MG CAPS Take 0.4 mg by mouth daily after breakfast.   . Turmeric 500 MG CAPS Take 500 mg by mouth daily.  . vitamin B-12 (CYANOCOBALAMIN) 500 MCG tablet Take 1,000 mcg by mouth daily.   . predniSONE (DELTASONE) 20 MG tablet One tab twice a day for 3 days then daily until return   No facility-administered encounter medications on file as  of 10/15/2017.     Allergies  Allergen Reactions  . Tetanus Toxoids Swelling    Face swelling  . Statins Other (See Comments)    Hard to walk, severe leg cramps  . Doxycycline Rash    Rash only  . Oxycodone Other (See Comments)    Severe constipation  . Penicillins Swelling and Rash    Took as a child, developed rash over time Has patient had a PCN reaction causing immediate rash, facial/tongue/throat swelling, SOB or lightheadedness with hypotension: Yes Has patient had a PCN reaction causing severe rash involving mucus membranes or skin necrosis: Unknown Has patient had a PCN reaction that required  hospitalization: No Has patient had a PCN reaction occurring within the last 10 years: No If all of the above answers are "NO", then may proceed with Cephalosporin use.   . Sulfa Antibiotics Swelling and Rash  . Xarelto [Rivaroxaban] Rash    rash    Review of Systems  Constitutional: Positive for fatigue. Negative for activity change, appetite change, chills and fever.       Feels ill  HENT: Positive for congestion, postnasal drip, sinus pressure and sinus pain. Negative for ear pain, rhinorrhea, sore throat and trouble swallowing.   Eyes: Negative for redness and visual disturbance.  Respiratory: Negative for cough and shortness of breath.   Cardiovascular: Negative for chest pain and palpitations.  Gastrointestinal: Negative for constipation and diarrhea.  Genitourinary: Negative for difficulty urinating and frequency.  Musculoskeletal: Negative for neck pain.  Neurological: Positive for headaches.  Psychiatric/Behavioral: Positive for sleep disturbance.    BP 124/82 (BP Location: Left Arm, Patient Position: Sitting, Cuff Size: Normal)   Pulse 70   Temp (!) 97.1 F (36.2 C) (Temporal)   Ht 5\' 11"  (1.803 m)   Wt 205 lb (93 kg)   SpO2 97%   BMI 28.59 kg/m   Physical Exam  Constitutional: He is oriented to person, place, and time. He appears well-developed. He appears ill. No distress.  Appears moderately tired  HENT:  Head: Normocephalic and atraumatic.  Mouth/Throat: Oropharynx is clear and moist.  Ethmoid sinuses are quite tender.  Nasal membranes are congested.  Posterior pharynx clear.   Eyes: EOM are normal. Pupils are equal, round, and reactive to light.  Conjunctivae are injected  Neck: Normal range of motion. Neck supple.  Cardiovascular: Normal rate and normal heart sounds.  Pulmonary/Chest: Effort normal and breath sounds normal. He has no rales.  Abdominal: Soft. Bowel sounds are normal.  Lymphadenopathy:    He has no cervical adenopathy.  Neurological: He  is alert and oriented to person, place, and time.  Psychiatric: He has a normal mood and affect. His behavior is normal.   ASSESSMENT/PLAN:  1. Frontal sinus pain   2. Chronic frontal sinusitis   3. Chronic intractable headache, unspecified headache type  - CT Maxillofacial WO CM; Future   Patient Instructions  Take the prednisone as instructed I have ordered  A CAT scan of the sinuses  See me in a week ( after scan)   Raylene Everts, MD

## 2017-10-15 NOTE — Patient Instructions (Signed)
Take the prednisone as instructed I have ordered  A CAT scan of the sinuses  See me in a week ( after scan)

## 2017-10-16 ENCOUNTER — Ambulatory Visit (HOSPITAL_COMMUNITY)
Admission: RE | Admit: 2017-10-16 | Discharge: 2017-10-16 | Disposition: A | Discharge status: Home / Self Care | Payer: Medicare Other | Source: Ambulatory Visit | Attending: Family Medicine | Admitting: Family Medicine

## 2017-10-16 DIAGNOSIS — R51 Headache: Secondary | ICD-10-CM | POA: Diagnosis not present

## 2017-10-16 DIAGNOSIS — G8929 Other chronic pain: Secondary | ICD-10-CM

## 2017-10-16 DIAGNOSIS — J329 Chronic sinusitis, unspecified: Secondary | ICD-10-CM | POA: Diagnosis not present

## 2017-10-23 ENCOUNTER — Ambulatory Visit: Payer: Medicare Other | Admitting: Family Medicine

## 2017-10-23 ENCOUNTER — Telehealth: Payer: Self-pay | Admitting: Internal Medicine

## 2017-10-23 DIAGNOSIS — K2100 Gastro-esophageal reflux disease with esophagitis, without bleeding: Secondary | ICD-10-CM

## 2017-10-23 DIAGNOSIS — R1310 Dysphagia, unspecified: Secondary | ICD-10-CM

## 2017-10-23 NOTE — Telephone Encounter (Signed)
Routing to refill box  

## 2017-10-23 NOTE — Telephone Encounter (Signed)
Pt said that he needed a new prescription of pantoprazole 40 mg sent to [facility] for a 90 day supply with 3 refills

## 2017-10-24 MED ORDER — PANTOPRAZOLE SODIUM 40 MG PO TBEC: 40.0000 mg | DELAYED_RELEASE_TABLET | Freq: Every day | ORAL | 3 refills | Status: DC

## 2017-10-24 NOTE — Addendum Note (Signed)
Addended by: Gordy Levan, ERIC A on: 10/24/2017 02:35 PM   Modules accepted: Orders

## 2017-10-30 ENCOUNTER — Encounter: Payer: Self-pay | Admitting: Cardiology

## 2017-11-03 NOTE — Progress Notes (Signed)
Cardiology Office Note    Date:  11/04/2017   ID:  Timm, Bonenberger [birth year], MRN [id number]  PCP:  Caren Macadam, MD  Cardiologist: Dorris Carnes, MD   EP: Dr. Curt Bears  Chief Complaint  Patient presents with  . Follow-up    6 week visit    History of Present Illness:    Brent Wilkins is a 77 y.o. male with past medical history of paroxysmal atrial fibrillation (on Tikosyn), HTN, and HLD who presents to the office today for six-week follow-up.  He was last examined by Dr. Harrington Challenger on 09/22/2017 and he reported having recent episodes of dysphasia over the past several months which was being followed by GI. Had recently undergone esophageal dilation and reported improvement in his symptoms. Denied any recent chest pain, palpitations, dyspnea on exertion, or syncope. BP was elevated to 162/100 at the time of his visit but he reported having not yet taken any of his morning medications, therefore no medication adjustments were made during his visit.   In talking with the patient today, he reports still recovering from a sinus infection which has been present for the past several months. He recently completed a course of steroids and has noted improvement in his symptoms. He has been following blood pressure at home and reports this is been elevated in the 140's to 170's/80's to 90's.   He denies any recent dyspnea on exertion, orthopnea, PND, lower extremity edema, palpitations, or chest pain.  He remains on Eliquis for anticoagulation and denies any evidence of active bleeding with this.   Past Medical History:  Diagnosis Date  . Allergy    hay fever  . Arthritis    right ankle  . Cataract    surgery 2016  . Chronic kidney disease    stones many years ago  . Dizziness 12/13/2015  . Dysphagia   . Frequent urination at night   . GERD (gastroesophageal reflux disease)   . History of kidney stones   . HTN (hypertension)   . Hypothyroidism   . 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 Neuropathy    Bilateral ankles  . Paroxysmal atrial fibrillation (HCC)    a. on Tikosyn and Eliquis  . Polyneuropathy 06/16/2017  . Prostate cancer [facility]) 2010   Dr. Rosana Hoes  . Schatzki's ring 10/22/2011  . Seasonal allergies   . Syncope  dx. 8 yrs ago    Past Surgical History:  Procedure Laterality Date  . APPENDECTOMY    . CATARACT EXTRACTION W/PHACO Left 03/27/2015   Procedure: CATARACT EXTRACTION PHACO AND INTRAOCULAR LENS PLACEMENT LEFT EYE CDE=11.73;  Surgeon: Tonny Branch, MD;  Location: [facility];  Service: Ophthalmology;  Laterality: Left;  . CATARACT EXTRACTION W/PHACO Right 03/30/2015   Procedure: CATARACT EXTRACTION PHACO AND INTRAOCULAR LENS PLACEMENT RIGHT EYE CDE=7.35;  Surgeon: Tonny Branch, MD;  Location: [facility];  Service: Ophthalmology;  Laterality: Right;  . CHOLECYSTECTOMY    . COLONOSCOPY  07/23/10   Dr. Vivi Ferns rectum, scattered pancolonic diverticula  . COLONOSCOPY  08/10/1999   internal hemorrhoids,inflammatory polyp  . ESOPHAGOGASTRODUODENOSCOPY  04/12/08   Prominent Schatzki's ring, erosive reflux esophagitis, multiple antral erosions, small hiatal hernia, reactive gastropathy, status post dilation with 62F  . ESOPHAGOGASTRODUODENOSCOPY  10/28/2011   Procedure: ESOPHAGOGASTRODUODENOSCOPY (EGD);  Surgeon: Daneil Dolin, MD;  Location: [facility];  Service: Endoscopy;  Laterality: N/A;  1:30  . ESOPHAGOGASTRODUODENOSCOPY N/A 09/10/2017   Procedure: ESOPHAGOGASTRODUODENOSCOPY (EGD);  Surgeon: Manus Rudd  M, MD;  Location: [facility];  Service: Endoscopy;  Laterality: N/A;  12:30PM  . EYE SURGERY    . MALONEY DILATION  10/28/2011   Procedure: Venia Minks DILATION;  Surgeon: Daneil Dolin, MD;  Location: [facility];  Service: Endoscopy;  Laterality: N/A;  . Venia Minks DILATION N/A 09/10/2017   Procedure: Venia Minks DILATION;  Surgeon: Daneil Dolin, MD;  Location: [facility];  Service: Endoscopy;  Laterality: N/A;  . SAVORY DILATION  10/28/2011   Procedure: SAVORY  DILATION;  Surgeon: Daneil Dolin, MD;  Location: [facility];  Service: Endoscopy;  Laterality: N/A;  . TONSILLECTOMY    . TOTAL ANKLE ARTHROPLASTY Left 11/25/2013   Procedure: TOTAL ANKLE ARTHOPLASTY;  Surgeon: Wylene Simmer, MD;  Location: [facility];  Service: Orthopedics;  Laterality: Left;  . TOTAL ANKLE ARTHROPLASTY Right 07/14/2014   dr hewitt  . TOTAL ANKLE ARTHROPLASTY Right 07/14/2014   Procedure: RIGHT TOTAL ANKLE ARTHOPLASTY;  Surgeon: Wylene Simmer, MD;  Location: [facility];  Service: Orthopedics;  Laterality: Right;    Current Medications: Outpatient Medications Prior to Visit  Medication Sig Dispense Refill  . celecoxib (CELEBREX) 200 MG capsule Take 200 mg by mouth daily as needed for mild pain.     Marland Kitchen dofetilide (TIKOSYN) 500 MCG capsule Take 1 capsule (500 mcg total) by mouth 2 (two) times daily. 180 capsule 1  . ELIQUIS 5 MG TABS tablet TAKE 1 TABLET BY MOUTH TWO  TIMES DAILY 180 tablet 3  . finasteride (PROSCAR) 5 MG tablet Take 5 mg by mouth daily.    . fluticasone (FLONASE) 50 MCG/ACT nasal spray Place 1 spray into both nostrils daily as needed for allergies. 48 g 3  . gabapentin (NEURONTIN) 300 MG capsule Take 300 mg by mouth 2 (two) times daily.     Marland Kitchen levothyroxine (SYNTHROID, LEVOTHROID) 75 MCG tablet Take 1 tablet (75 mcg total) by mouth daily. 90 tablet 3  . loratadine (CLARITIN) 10 MG tablet Take 10 mg by mouth daily as needed for allergies.    . Melatonin 10 MG TABS Take 10 mg by mouth at bedtime.    . Omega-3 Fatty Acids (RA FISH OIL) 1400 MG CPDR Take 1,400 mg by mouth daily.    . pantoprazole (PROTONIX) 40 MG tablet Take 1 tablet (40 mg total) by mouth daily. 90 tablet 3  . Polyethyl Glycol-Propyl Glycol (SYSTANE) 0.4-0.3 % SOLN Place 1 drop into both eyes 2 (two) times daily as needed (dry eyes).     . predniSONE (DELTASONE) 20 MG tablet One tab twice a day for 3 days then daily until return 20 tablet 0  . propranolol (INDERAL) 10 MG tablet Take 1 tablet (10 mg total)  daily by mouth. 90 tablet 3  . Tamsulosin HCl (FLOMAX) 0.4 MG CAPS Take 0.4 mg by mouth daily after breakfast.     . Turmeric 500 MG CAPS Take 500 mg by mouth daily.    . vitamin B-12 (CYANOCOBALAMIN) 500 MCG tablet Take 1,000 mcg by mouth daily.     Marland Kitchen losartan (COZAAR) 50 MG tablet Take 1 tablet (50 mg total) by mouth daily. 90 tablet 0   No facility-administered medications prior to visit.      Allergies:   Tetanus toxoids; Statins; Doxycycline; Oxycodone; Penicillins; Sulfa antibiotics; and Xarelto [rivaroxaban]   Social History   Socioeconomic History  . Marital status: Married    Spouse name: Candace  . Number of children: 2  . Years of education: Not on  file  . Highest education level: Bachelor's degree (e.g., BA, AB, BS)  Occupational History  . Occupation: retired; Ambulance person: RETIRED  Social Needs  . Financial resource strain: Not hard at all  . Food insecurity:    Worry: Never true    Inability: Never true  . Transportation needs:    Medical: No    Non-medical: No  Tobacco Use  . Smoking status: Never Smoker  . Smokeless tobacco: Never Used  Substance and Sexual Activity  . Alcohol use: Yes    Alcohol/week: 0.6 - 1.2 oz    Types: 1 - 2 Cans of beer per week    Comment: 2 beers a week  . Drug use: No  . Sexual activity: Yes  Lifestyle  . Physical activity:    Days per week: 5 days    Minutes per session: 30 min  . Stress: Not at all  Relationships  . Social connections:    Talks on phone: More than three times a week    Gets together: More than three times a week    Attends religious service: More than 4 times per year    Active member of club or organization: No    Attends meetings of clubs or organizations: Never    Relationship status: Married  Other Topics Concern  . Not on file  Social History Narrative   Lives with wife Heloise [address] until Feb - 6 months after episode     Family History:  The patient's family  history includes ALS in his son; Arthritis in his mother; Cancer in his brother and father; Cancer (age of onset: 38) in his son; Heart disease in his mother; Hypertension in his father; Prostate cancer in his brother and father; Prostate cancer (age of onset: 16) in his son.   Review of Systems:   Please see the history of present illness.     General:  No chills, fever, night sweats or weight changes.  Cardiovascular:  No chest pain, dyspnea on exertion, edema, orthopnea, palpitations, paroxysmal nocturnal dyspnea. Dermatological: No rash, lesions/masses Respiratory: No cough, dyspnea. Positive for sinus congestion.  Urologic: No hematuria, dysuria Abdominal:   No nausea, vomiting, diarrhea, bright red blood per rectum, melena, or hematemesis Neurologic:  No visual changes, wkns, changes in mental status.  All other systems reviewed and are otherwise negative except as noted above.   Physical Exam:    VS:  BP (!) 162/90   Pulse 62   Ht 5' 10.5" (1.791 m)   Wt 200 lb 12.8 oz (91.1 kg)   SpO2 98%   BMI 28.40 kg/m    General: Well developed, well nourished Caucasian male appearing in no acute distress. Head: Normocephalic, atraumatic, sclera non-icteric, no xanthomas, nares are without discharge.  Neck: No carotid bruits. JVD not elevated.  Lungs: Respirations regular and unlabored, without wheezes or rales.  Heart: Regular rate and rhythm. No S3 or S4.  No murmur, no rubs, or gallops appreciated. Abdomen: Soft, non-tender, non-distended with normoactive bowel sounds. No hepatomegaly. No rebound/guarding. No obvious abdominal masses. Msk:  Strength and tone appear normal for age. No joint deformities or effusions. Extremities: No clubbing or cyanosis. No lower extremity edema.  Distal pedal pulses are 2+ bilaterally. Neuro: Alert and oriented X 3. Moves all extremities spontaneously. No focal deficits noted. Psych:  Responds to questions appropriately with a normal affect. Skin: No  rashes or lesions noted  Wt Readings from Last  3 Encounters:  11/04/17 200 lb 12.8 oz (91.1 kg)  10/15/17 205 lb (93 kg)  09/22/17 201 lb (91.2 kg)     Studies/Labs Reviewed:   EKG:  EKG is not ordered today.    Recent Labs: 03/20/2017: TSH 2.125 05/06/2017: ALT 11 05/15/2017: Magnesium 2.0 09/22/2017: BUN 18; Creatinine, Ser 1.15; Hemoglobin 14.6; Platelets 183; Potassium 4.0; Sodium 141   Lipid Panel    Component Value Date/Time   CHOL 162 09/22/2017 1444   TRIG 151 (H) 09/22/2017 1444   HDL 49 09/22/2017 1444   CHOLHDL 3.3 09/22/2017 1444   VLDL 30 09/22/2017 1444   LDLCALC 83 09/22/2017 1444    Additional studies/ records that were reviewed today include:   Echocardiogram: 03/2017 Study Conclusions  - Left ventricle: The cavity size was normal. Wall thickness was   increased in a pattern of moderate LVH. Systolic function was   normal. The estimated ejection fraction was in the range of 60%   to 65%. Wall motion was normal; there were no regional wall   motion abnormalities. Doppler parameters are consistent with   restrictive physiology, indicative of decreased left ventricular   diastolic compliance and/or increased left atrial pressure.   Doppler parameters are consistent with high ventricular filling   pressure. - Aortic valve: Moderately calcified annulus. Trileaflet. - Mitral valve: Mildly calcified annulus. - Left atrium: The atrium was severely dilated. - Right ventricle: Systolic function was mildly reduced. - Right atrium: The atrium was mildly to moderately dilated. - Tricuspid valve: There was mild-moderate regurgitation. - Pulmonary arteries: Incomplete TR jet to accurately estimated   PASP.  Assessment:    1. PAF (paroxysmal atrial fibrillation) ([facility])   2. Current use of long term anticoagulation   3. Essential hypertension   4. Hypercholesterolemia      Plan:   In order of problems listed above:  1. Paroxysmal Atrial Fibrillation/ Use  of Long-Term Anticoagulation - He denies any recent palpitations and is maintaining normal sinus rhythm by examination today. Remains on Tikosyn 500 mcg twice daily and Propranolol 10mg  daily.  - Denies any evidence of active bleeding. Continue Eliquis 5 mg twice daily for anticoagulation.  2. HTN - BP remains elevated at 162/90 during today's visit and he reports similar readings when this is checked at home. - He is currently on Losartan 50 mg daily and Propranolol 10 mg daily.  eports a history of bradycardia with higher doses of beta-blocker in the past. We will plan to increase Losartan to 100 mg daily. He will need a repeat BMET in 2 weeks. I have asked him to continue to follow his blood pressure at home.  3. HLD - FLP in 09/2017 showed total cholesterol of 162, Triglycerides 162, HDL 49, and LDL 83. - diet-controlled. Not on statin therapy.    Medication Adjustments/Labs and Tests Ordered: Current medicines are reviewed at length with the patient today.  Concerns regarding medicines are outlined above.  Medication changes, Labs and Tests ordered today are listed in the Patient Instructions below.   Patient Instructions  Increase Losartan to 100mg  daily.   Obtain a repeat BMET in two weeks.   Follow-up with Dr. Harrington Challenger in 6 months.     Signed, Erma Heritage, PA-C  11/04/2017 2:48 PM    [facility] [address] [place], [place] [address] Phone: [phone]

## 2017-11-04 ENCOUNTER — Ambulatory Visit: Payer: Medicare Other | Admitting: Student

## 2017-11-04 ENCOUNTER — Encounter: Payer: Self-pay | Admitting: Student

## 2017-11-04 VITALS — BP 162/90 | HR 62 | Ht 70.5 in | Wt 200.8 lb

## 2017-11-04 DIAGNOSIS — I10 Essential (primary) hypertension: Secondary | ICD-10-CM | POA: Diagnosis not present

## 2017-11-04 DIAGNOSIS — Z7901 Long term (current) use of anticoagulants: Secondary | ICD-10-CM

## 2017-11-04 DIAGNOSIS — E7800 Pure hypercholesterolemia, unspecified: Secondary | ICD-10-CM

## 2017-11-04 DIAGNOSIS — I480 Paroxysmal atrial fibrillation: Secondary | ICD-10-CM | POA: Diagnosis not present

## 2017-11-04 MED ORDER — LOSARTAN POTASSIUM 100 MG PO TABS: 100.0000 mg | ORAL_TABLET | Freq: Every day | ORAL | 3 refills | Status: DC

## 2017-11-04 NOTE — Patient Instructions (Addendum)
Increase Losartan to 100mg  daily.   Obtain a repeat BMET in two weeks.   Follow-up with Dr. Harrington Challenger in 6 months.

## 2017-11-20 ENCOUNTER — Ambulatory Visit: Payer: Medicare Other | Admitting: Nurse Practitioner

## 2017-11-20 ENCOUNTER — Other Ambulatory Visit (HOSPITAL_COMMUNITY)
Admission: RE | Admit: 2017-11-20 | Discharge: 2017-11-20 | Disposition: A | Discharge status: Home / Self Care | Payer: Medicare Other | Source: Ambulatory Visit | Attending: Student | Admitting: Student

## 2017-11-20 DIAGNOSIS — I480 Paroxysmal atrial fibrillation: Secondary | ICD-10-CM | POA: Diagnosis present

## 2017-11-20 DIAGNOSIS — I10 Essential (primary) hypertension: Secondary | ICD-10-CM | POA: Diagnosis not present

## 2017-11-20 LAB — BASIC METABOLIC PANEL
Anion gap: 9 (ref 5–15)
BUN: 15 mg/dL (ref 6–20)
CO2: 27 mmol/L (ref 22–32)
Calcium: 9.4 mg/dL (ref 8.9–10.3)
Chloride: 109 mmol/L (ref 101–111)
Creatinine, Ser: 1.16 mg/dL (ref 0.61–1.24)
GFR calc Af Amer: >60 mL/min (ref >60)
GFR, EST NON AFRICAN AMERICAN: 58 mL/min — AB (ref >60)
GLUCOSE: 104 mg/dL — AB (ref 65–99)
Potassium: 4.4 mmol/L (ref 3.5–5.1)
Sodium: 145 mmol/L (ref 135–145)

## 2017-12-05 ENCOUNTER — Telehealth: Payer: Self-pay | Admitting: Student

## 2017-12-05 ENCOUNTER — Other Ambulatory Visit: Payer: Self-pay

## 2017-12-05 MED ORDER — AMLODIPINE BESYLATE 5 MG PO TABS: 5.0000 mg | ORAL_TABLET | Freq: Every day | ORAL | 3 refills | Status: DC

## 2017-12-05 NOTE — Telephone Encounter (Signed)
BP after increasing losartan to 100 mg qd, 133/91, 163/101, 181/101, 173/111, 185/112  HR low 60's

## 2017-12-05 NOTE — Telephone Encounter (Signed)
    With him being on the increased dosing for over a month, I would have expected BP to trend down by now. He is already on the highest dose of Losartan and was intolerant to higher doses of Propranolol in the past. Would recommend starting Amlodipine 5mg  daily. He should have a Nursing Visit in 2 weeks to reassess BP.   Thanks, Erma Heritage, PA-C 12/05/2017, 11:26 AM Pager: [phone]

## 2017-12-05 NOTE — Telephone Encounter (Signed)
Patient would like to speak with nurse regarding new medication and BP readings. / tg

## 2017-12-05 NOTE — Telephone Encounter (Signed)
I spoke with wife, will escribe 30 day trial of amlodipine 5 mg daily, fu nurse apt on 5/16 at 4 pm

## 2017-12-05 NOTE — Telephone Encounter (Signed)
escribed amlodipine 5 mg to [facility]

## 2017-12-08 ENCOUNTER — Ambulatory Visit: Payer: Medicare Other | Admitting: Cardiology

## 2017-12-12 DIAGNOSIS — Z8042 Family history of malignant neoplasm of prostate: Secondary | ICD-10-CM | POA: Insufficient documentation

## 2017-12-12 HISTORY — DX: Family history of malignant neoplasm of prostate: Z80.42

## 2017-12-15 ENCOUNTER — Ambulatory Visit: Payer: Medicare Other | Admitting: Family Medicine

## 2017-12-16 ENCOUNTER — Telehealth: Payer: Self-pay | Admitting: Internal Medicine

## 2017-12-16 ENCOUNTER — Encounter: Payer: Self-pay | Admitting: Cardiology

## 2017-12-16 ENCOUNTER — Ambulatory Visit: Payer: Medicare Other | Admitting: Cardiology

## 2017-12-16 VITALS — BP 148/96 | HR 67 | Ht 71.0 in | Wt 197.0 lb

## 2017-12-16 DIAGNOSIS — R55 Syncope and collapse: Secondary | ICD-10-CM | POA: Diagnosis not present

## 2017-12-16 DIAGNOSIS — I4819 Other persistent atrial fibrillation: Secondary | ICD-10-CM

## 2017-12-16 DIAGNOSIS — I10 Essential (primary) hypertension: Secondary | ICD-10-CM | POA: Diagnosis not present

## 2017-12-16 DIAGNOSIS — I481 Persistent atrial fibrillation: Secondary | ICD-10-CM | POA: Diagnosis not present

## 2017-12-16 MED ORDER — AMLODIPINE BESYLATE 10 MG PO TABS: 10.0000 mg | ORAL_TABLET | Freq: Every day | ORAL | 3 refills | Status: DC

## 2017-12-16 NOTE — Telephone Encounter (Signed)
Pt saw Dr. Curt Bears today and he told the pt there was no need to have his BP check tomorrow, everything looked fine and the pt is tracking his readings.. Per phone call from pt

## 2017-12-16 NOTE — Progress Notes (Signed)
Electrophysiology Office Note   Date:  12/16/2017   ID:  Lc, Joynt [birth year], MRN [id number]  PCP:  Caren Macadam, MD  Cardiologist:  Harrington Challenger Primary Electrophysiologist:  Roxy Mastandrea [name], MD    Chief Complaint  Patient presents with  . Follow-up    Persistent Afib     History of Present Illness: Brent Wilkins is a 77 y.o. male who is being seen today for the evaluation of atrial fibrillation at the request of Raylene Everts, MD. Presenting today for electrophysiology evaluation. He has a history of hypertension, paroxysmal atrial fibrillation, and hyperlipidemia. He was seen in the hospital total at the beginning of April for syncope. The patient was bradycardic and thus not on any AV nodal blockers. At the time of hospitalization, he was found to be orthostatic.  Due to his atrial fibrillation, he was admitted to the hospital for Tigas and administration.  He tolerated the dose well and was discharged on 500 mcg.  Today, denies symptoms of palpitations, chest pain, shortness of breath, orthopnea, PND, lower extremity edema, claudication, dizziness, presyncope, syncope, bleeding, or neurologic sequela. The patient is tolerating medications without difficulties.  Overall he is feeling well.  He has had no further episodes of atrial fibrillation.  He also has had no further episodes of syncope.   Past Medical History:  Diagnosis Date  . Allergy    hay fever  . Arthritis    right ankle  . Cataract    surgery 2016  . Chronic kidney disease    stones many years ago  . Dizziness 12/13/2015  . Dysphagia   . Frequent urination at night   . GERD (gastroesophageal reflux disease)   . History of kidney stones   . HTN (hypertension)   . Hypothyroidism   . 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 Neuropathy    Bilateral ankles  . Paroxysmal atrial fibrillation (HCC)    a. on Tikosyn and Eliquis  . Polyneuropathy 06/16/2017  . Prostate cancer [facility]) 2010   Dr. Rosana Hoes    . Schatzki's ring 10/22/2011  . Seasonal allergies   . Syncope  dx. 8 yrs ago   Past Surgical History:  Procedure Laterality Date  . APPENDECTOMY    . CATARACT EXTRACTION W/PHACO Left 03/27/2015   Procedure: CATARACT EXTRACTION PHACO AND INTRAOCULAR LENS PLACEMENT LEFT EYE CDE=11.73;  Surgeon: Tonny Branch, MD;  Location: [facility];  Service: Ophthalmology;  Laterality: Left;  . CATARACT EXTRACTION W/PHACO Right 03/30/2015   Procedure: CATARACT EXTRACTION PHACO AND INTRAOCULAR LENS PLACEMENT RIGHT EYE CDE=7.35;  Surgeon: Tonny Branch, MD;  Location: [facility];  Service: Ophthalmology;  Laterality: Right;  . CHOLECYSTECTOMY    . COLONOSCOPY  07/23/10   Dr. Vivi Ferns rectum, scattered pancolonic diverticula  . COLONOSCOPY  08/10/1999   internal hemorrhoids,inflammatory polyp  . ESOPHAGOGASTRODUODENOSCOPY  04/12/08   Prominent Schatzki's ring, erosive reflux esophagitis, multiple antral erosions, small hiatal hernia, reactive gastropathy, status post dilation with 21F  . ESOPHAGOGASTRODUODENOSCOPY  10/28/2011   Procedure: ESOPHAGOGASTRODUODENOSCOPY (EGD);  Surgeon: Daneil Dolin, MD;  Location: [facility];  Service: Endoscopy;  Laterality: N/A;  1:30  . ESOPHAGOGASTRODUODENOSCOPY N/A 09/10/2017   Procedure: ESOPHAGOGASTRODUODENOSCOPY (EGD);  Surgeon: Daneil Dolin, MD;  Location: [facility];  Service: Endoscopy;  Laterality: N/A;  12:30PM  . EYE SURGERY    . MALONEY DILATION  10/28/2011   Procedure: Venia Minks DILATION;  Surgeon: Daneil Dolin, MD;  Location: [facility];  Service: Endoscopy;  Laterality: N/A;  . Venia Minks DILATION N/A 09/10/2017   Procedure: Venia Minks DILATION;  Surgeon: Daneil Dolin, MD;  Location: [facility];  Service: Endoscopy;  Laterality: N/A;  . SAVORY DILATION  10/28/2011   Procedure: SAVORY DILATION;  Surgeon: Daneil Dolin, MD;  Location: [facility];  Service: Endoscopy;  Laterality: N/A;  . TONSILLECTOMY    . TOTAL ANKLE ARTHROPLASTY Left 11/25/2013   Procedure:  TOTAL ANKLE ARTHOPLASTY;  Surgeon: Wylene Simmer, MD;  Location: [facility];  Service: Orthopedics;  Laterality: Left;  . TOTAL ANKLE ARTHROPLASTY Right 07/14/2014   dr hewitt  . TOTAL ANKLE ARTHROPLASTY Right 07/14/2014   Procedure: RIGHT TOTAL ANKLE ARTHOPLASTY;  Surgeon: Wylene Simmer, MD;  Location: [facility];  Service: Orthopedics;  Laterality: Right;     Current Outpatient Medications  Medication Sig Dispense Refill  . amLODipine (NORVASC) 5 MG tablet Take 1 tablet (5 mg total) by mouth daily. 30 tablet 3  . celecoxib (CELEBREX) 200 MG capsule Take 200 mg by mouth daily as needed for mild pain.     Marland Kitchen dofetilide (TIKOSYN) 500 MCG capsule Take 1 capsule (500 mcg total) by mouth 2 (two) times daily. 180 capsule 1  . ELIQUIS 5 MG TABS tablet TAKE 1 TABLET BY MOUTH TWO  TIMES DAILY 180 tablet 3  . finasteride (PROSCAR) 5 MG tablet Take 5 mg by mouth daily.    . fluticasone (FLONASE) 50 MCG/ACT nasal spray Place 1 spray into both nostrils daily as needed for allergies. 48 g 3  . gabapentin (NEURONTIN) 300 MG capsule Take 300 mg by mouth 2 (two) times daily.     Marland Kitchen levothyroxine (SYNTHROID, LEVOTHROID) 75 MCG tablet Take 1 tablet (75 mcg total) by mouth daily. 90 tablet 3  . loratadine (CLARITIN) 10 MG tablet Take 10 mg by mouth daily as needed for allergies.    Marland Kitchen losartan (COZAAR) 100 MG tablet Take 1 tablet (100 mg total) by mouth daily. 90 tablet 3  . Melatonin 10 MG TABS Take 10 mg by mouth at bedtime.    . Omega-3 Fatty Acids (RA FISH OIL) 1400 MG CPDR Take 1,400 mg by mouth daily.    . pantoprazole (PROTONIX) 40 MG tablet Take 1 tablet (40 mg total) by mouth daily. 90 tablet 3  . Polyethyl Glycol-Propyl Glycol (SYSTANE) 0.4-0.3 % SOLN Place 1 drop into both eyes 2 (two) times daily as needed (dry eyes).     . predniSONE (DELTASONE) 20 MG tablet One tab twice a day for 3 days then daily until return 20 tablet 0  . propranolol (INDERAL) 10 MG tablet Take 1 tablet (10 mg total) daily by mouth. 90 tablet  3  . Tamsulosin HCl (FLOMAX) 0.4 MG CAPS Take 0.4 mg by mouth daily after breakfast.     . Turmeric 500 MG CAPS Take 500 mg by mouth daily.    . vitamin B-12 (CYANOCOBALAMIN) 500 MCG tablet Take 1,000 mcg by mouth daily.      No current facility-administered medications for this visit.     Allergies:   Tetanus toxoids; Statins; Doxycycline; Oxycodone; Penicillins; Sulfa antibiotics; and Xarelto [rivaroxaban]   Social History:  The patient  reports that he has never smoked. He has never used smokeless tobacco. He reports that he drinks about 0.6 - 1.2 oz of alcohol per week. He reports that he does not use drugs.   Family History:  The patient's family history includes ALS in his son; Arthritis in his mother;  Cancer in his brother and father; Cancer (age of onset: 61) in his son; Heart disease in his mother; Hypertension in his father; Prostate cancer in his brother and father; Prostate cancer (age of onset: 68) in his son.    ROS:  Please see the history of present illness.   Otherwise, review of systems is positive for headaches, easy bruising.   All other systems are reviewed and negative.   PHYSICAL EXAM: VS:  BP (!) 148/96   Pulse 67   Ht 5\' 11"  (1.803 m)   Wt 197 lb (89.4 kg)   SpO2 95%   BMI 27.48 kg/m  , BMI Body mass index is 27.48 kg/m. GEN: Well nourished, well developed, in no acute distress  HEENT: normal  Neck: no JVD, carotid bruits, or masses Cardiac: RRR; no murmurs, rubs, or gallops,no edema  Respiratory:  clear to auscultation bilaterally, normal work of breathing GI: soft, nontender, nondistended, + BS MS: no deformity or atrophy  Skin: warm and dry Neuro:  Strength and sensation are intact Psych: euthymic mood, full affect  EKG:  EKG is ordered today. Personal review of the ekg ordered shows this rhythm, right bundle branch block, first-degree AV block, QTC 488 ms   Recent Labs: 03/20/2017: TSH 2.125 05/06/2017: ALT 11 05/15/2017: Magnesium  2.0 09/22/2017: Hemoglobin 14.6; Platelets 183 11/20/2017: BUN 15; Creatinine, Ser 1.16; Potassium 4.4; Sodium 145    Lipid Panel     Component Value Date/Time   CHOL 162 09/22/2017 1444   TRIG 151 (H) 09/22/2017 1444   HDL 49 09/22/2017 1444   CHOLHDL 3.3 09/22/2017 1444   VLDL 30 09/22/2017 1444   LDLCALC 83 09/22/2017 1444     Wt Readings from Last 3 Encounters:  12/16/17 197 lb (89.4 kg)  11/04/17 200 lb 12.8 oz (91.1 kg)  10/15/17 205 lb (93 kg)      Other studies Reviewed: Additional studies/ records that were reviewed today include: 30 day monitor 04/28/17 - personally reviewed  Review of the above records today demonstrates:   Atrial fibrillation and flutter seen throughout study.  Multiple pauses up to 3 seconds with most occurring during early morning hours (presumably asleep) and one occurring at approximately 1 pm.  TTE 03/21/17 - Left ventricle: The cavity size was normal. Wall thickness was   increased in a pattern of moderate LVH. Systolic function was   normal. The estimated ejection fraction was in the range of 60%   to 65%. Wall motion was normal; there were no regional wall   motion abnormalities. Doppler parameters are consistent with   restrictive physiology, indicative of decreased left ventricular   diastolic compliance and/or increased left atrial pressure.   Doppler parameters are consistent with high ventricular filling   pressure. - Aortic valve: Moderately calcified annulus. Trileaflet. - Mitral valve: Mildly calcified annulus. - Left atrium: The atrium was severely dilated. - Right ventricle: Systolic function was mildly reduced. - Right atrium: The atrium was mildly to moderately dilated. - Tricuspid valve: There was mild-moderate regurgitation. - Pulmonary arteries: Incomplete TR jet to accurately estimated   PASP.  ASSESSMENT AND PLAN:  1.  Atrial fibrillation/atrial flutter: Currently on Eliquis and dofetilide.  Tolerated this  medication without issue.  He has had no further episodes of syncope.  He remains in sinus rhythm.  This patients CHA2DS2-VASc Score and unadjusted Ischemic Stroke Rate (% per year) is equal to 3.2 % stroke rate/year from a score of 3  Above score calculated as 1 point  each if present [CHF, HTN, DM, Vascular=MI/PAD/Aortic Plaque, Age if 110-74, or Male] Above score calculated as 2 points each if present [Age > 75, or Stroke/TIA/TE]   2. Syncope: Further episodes.  He is currently able to drive at this point.  He feels that it is due to an issue with swallowing as he had a similar near syncopal episode after drinking very cold water.  3. Hypertension: Pressure remains elevated.  We Quandarius Nill increase his Norvasc to 10 mg.    Current medicines are reviewed at length with the patient today.   The patient does not have concerns regarding his medicines.  The following changes were made today: Increase Norvasc  Labs/ tests ordered today include:  Orders Placed This Encounter  Procedures  . EKG 12-Lead     Disposition:   FU with [facility] 6 months  Signed, Matalie Romberger [name], MD  12/16/2017 2:53 PM     [facility] [address] [place] [address] [phone] (office) [phone] (fax)

## 2017-12-16 NOTE — Addendum Note (Signed)
Addended by: Sandrea Hammond D on: 12/16/2017 03:00 PM   Modules accepted: Orders

## 2017-12-16 NOTE — Patient Instructions (Signed)
Medication Instructions: Increase Amlodipine to 10 mg once daily  Labwork: Your physician recommends that you have lab work today: MAG   Procedures/Testing: None ordered  Follow-Up:  Your physician wants you to follow-up in: 6 months with Dr. Curt Bears.   You will receive a reminder letter in the mail two months in advance. If you don't receive a letter, please call our office to schedule the follow-up appointment.   Any Additional Special Instructions Will Be Listed Below (If Applicable).     If you need a refill on your cardiac medications before your next appointment, please call your pharmacy.

## 2017-12-16 NOTE — Addendum Note (Signed)
Addended by: Sandrea Hammond D on: 12/16/2017 03:06 PM   Modules accepted: Orders

## 2017-12-16 NOTE — Addendum Note (Signed)
Addended by: Stanton Kidney on: 12/16/2017 03:01 PM   Modules accepted: Orders

## 2017-12-16 NOTE — Telephone Encounter (Signed)
Noted  

## 2017-12-17 ENCOUNTER — Ambulatory Visit: Payer: Medicare Other

## 2017-12-17 LAB — MAGNESIUM: Magnesium: 2.3 mg/dL (ref 1.6–2.3)

## 2017-12-18 ENCOUNTER — Ambulatory Visit: Payer: Medicare Other

## 2017-12-24 ENCOUNTER — Encounter: Payer: Self-pay | Admitting: Gastroenterology

## 2017-12-25 ENCOUNTER — Ambulatory Visit: Payer: Medicare Other | Admitting: Family Medicine

## 2017-12-30 ENCOUNTER — Telehealth: Payer: Self-pay | Admitting: Family Medicine

## 2017-12-30 NOTE — Telephone Encounter (Signed)
Left patient a voicemail to call back to discuss rescheduling his time for 01/02/18. At this time we have to many [name] transitions scheduled for that day in 20 min slots.

## 2018-01-02 ENCOUNTER — Encounter: Payer: Self-pay | Admitting: Family Medicine

## 2018-01-02 ENCOUNTER — Ambulatory Visit (INDEPENDENT_AMBULATORY_CARE_PROVIDER_SITE_OTHER): Payer: Medicare Other | Admitting: Family Medicine

## 2018-01-02 ENCOUNTER — Other Ambulatory Visit: Payer: Self-pay

## 2018-01-02 VITALS — BP 124/86 | HR 69 | Temp 98.0°F | Resp 16 | Ht 70.5 in | Wt 199.0 lb

## 2018-01-02 DIAGNOSIS — Z23 Encounter for immunization: Secondary | ICD-10-CM

## 2018-01-02 DIAGNOSIS — Z8546 Personal history of malignant neoplasm of prostate: Secondary | ICD-10-CM

## 2018-01-02 DIAGNOSIS — R739 Hyperglycemia, unspecified: Secondary | ICD-10-CM

## 2018-01-02 DIAGNOSIS — G629 Polyneuropathy, unspecified: Secondary | ICD-10-CM

## 2018-01-02 DIAGNOSIS — E039 Hypothyroidism, unspecified: Secondary | ICD-10-CM

## 2018-01-02 DIAGNOSIS — I10 Essential (primary) hypertension: Secondary | ICD-10-CM

## 2018-01-02 DIAGNOSIS — G250 Essential tremor: Secondary | ICD-10-CM

## 2018-01-02 NOTE — Progress Notes (Signed)
Patient ID: Brent Wilkins, male    DOB: 06-24-1941, 77 y.o.   MRN: [id number]  Chief Complaint  Patient presents with  . Establish Care    former [facility] patient    Allergies Tetanus toxoids; Statins; Doxycycline; Oxycodone; Penicillins; Sulfa antibiotics; and Xarelto [rivaroxaban]  Subjective:   Brent Wilkins is a 77 y.o. male who presents to [facility] today.  HPI Mr. Manigo presents to establish care. Has previously been seen by Dr. Lysle Morales.  He is also followed by Dr. Curt Bears and cardiology for atrial fibrillation/atrial flutter.  He was recently seen by Dr. Curt Bears several weeks ago.  At that time he was noticed to have an elevated blood pressure and his dose of amlodipine was increased to 10 mg p.o. daily.  He reports that he has been regularly checking his blood pressure at home since this increase of medication.  He reports his blood pressure has been well controlled.  It is been running in the 256L systolic which is a significant decrease from the 160s or even higher.  He reports that since his dose change that he has felt a little bit more sluggish.  His appetite is good.  He denies any chest pain, shortness of breath, palpitations, or swelling in his extremities.  He has had no side effects with the medication change but correlates his lower energy to his lower blood pressure.  He is not felt dizzy.  He has had no syncope.  He reports that he is taking all his medications as he should.  He had a magnesium check at Dr. Curt Bears office which was normal.  He has been taking his thyroid medication but his levels have not been checked in some time.  He denies any side effects with his medication. He also takes a B12 supplement but is never had his levels checked.  He has a history of neuropathy. He has had several elevated blood sugars in the past but never been screened for diabetes with a hemoglobin A1c. He is very active in his community.  He participates in [employer] for [employer].  He eats a good diet.  He denies any bleeding associated with his antiplatelet agents.  Denies any stomach pain.  No hematuria.  No skin rash or bruising.  Denies any melena or bright red blood per rectum. Has a history of prostate cancer and is followed by Dr. Rosana Hoes.  Reports he is urinating well.  Denies any urinary hesitancy, urgency, or dysuria.   Past Medical History:  Diagnosis Date  . Allergy    hay fever  . Arthritis    right ankle  . Cataract    surgery 2016  . Chronic kidney disease    stones many years ago  . Dizziness 12/13/2015  . Dysphagia   . Frequent urination at night   . GERD (gastroesophageal reflux disease)   . History of kidney stones   . HTN (hypertension)   . Hypothyroidism   . 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 Neuropathy    Bilateral ankles  . Paroxysmal atrial fibrillation (HCC)    a. on Tikosyn and Eliquis  . Polyneuropathy 06/16/2017  . Prostate cancer [facility]) 2010   Dr. Rosana Hoes  . Schatzki's ring 10/22/2011  . Seasonal allergies   . Syncope  dx. 8 yrs ago    Past Surgical History:  Procedure Laterality Date  . APPENDECTOMY    . CATARACT EXTRACTION W/PHACO Left 03/27/2015  Procedure: CATARACT EXTRACTION PHACO AND INTRAOCULAR LENS PLACEMENT LEFT EYE CDE=11.73;  Surgeon: Tonny Branch, MD;  Location: [facility];  Service: Ophthalmology;  Laterality: Left;  . CATARACT EXTRACTION W/PHACO Right 03/30/2015   Procedure: CATARACT EXTRACTION PHACO AND INTRAOCULAR LENS PLACEMENT RIGHT EYE CDE=7.35;  Surgeon: Tonny Branch, MD;  Location: [facility];  Service: Ophthalmology;  Laterality: Right;  . CHOLECYSTECTOMY    . COLONOSCOPY  07/23/10   Dr. Vivi Ferns rectum, scattered pancolonic diverticula  . COLONOSCOPY  08/10/1999   internal hemorrhoids,inflammatory polyp  . ESOPHAGOGASTRODUODENOSCOPY  04/12/08   Prominent Schatzki's ring, erosive reflux esophagitis, multiple antral erosions, small hiatal hernia, reactive gastropathy, status post  dilation with 77F  . ESOPHAGOGASTRODUODENOSCOPY  10/28/2011   Procedure: ESOPHAGOGASTRODUODENOSCOPY (EGD);  Surgeon: Daneil Dolin, MD;  [facility];  Service: Endoscopy;  Laterality: N/A;  1:30  . ESOPHAGOGASTRODUODENOSCOPY N/A 09/10/2017   Procedure: ESOPHAGOGASTRODUODENOSCOPY (EGD);  Surgeon: Daneil Dolin, MD;  [facility];  Service: Endoscopy;  Laterality: N/A;  12:30PM  . EYE SURGERY    . MALONEY DILATION  10/28/2011   Procedure: Venia Minks DILATION;  Surgeon: Daneil Dolin, MD;  [facility];  Service: Endoscopy;  Laterality: N/A;  . Venia Minks DILATION N/A 09/10/2017   Procedure: Venia Minks DILATION;  Surgeon: Daneil Dolin, MD;  [facility];  Service: Endoscopy;  Laterality: N/A;  . SAVORY DILATION  10/28/2011   Procedure: SAVORY DILATION;  Surgeon: Daneil Dolin, MD;  [facility];  Service: Endoscopy;  Laterality: N/A;  . TONSILLECTOMY    . TOTAL ANKLE ARTHROPLASTY Left 11/25/2013   Procedure: TOTAL ANKLE ARTHOPLASTY;  Surgeon: Wylene Simmer, MD;  Location: [place];  Service: Orthopedics;  Laterality: Left;  . TOTAL ANKLE ARTHROPLASTY Right 07/14/2014   dr hewitt  . TOTAL ANKLE ARTHROPLASTY Right 07/14/2014   Procedure: RIGHT TOTAL ANKLE ARTHOPLASTY;  Surgeon: Wylene Simmer, MD;  Location: [place];  Service: Orthopedics;  Laterality: Right;    Family History  Problem Relation Age of Onset  . Prostate cancer Father   . Cancer Father        prostate to bone  . Hypertension Father   . Prostate cancer Son 67  . ALS Son   . Cancer Son 59       prostate cancer  . Prostate cancer Brother   . Cancer Brother        prostate  . Heart disease Mother        died at 44  . Arthritis Mother   . Colon cancer Neg Hx      Social History   Socioeconomic History  . Marital status: Married    Spouse name: Candace  . Number of children: 2  . Years of education: Not on file  . Highest education level: Bachelor's degree (e.g., BA, AB, BS)    Occupational History  . Occupation: retired; Ambulance person: RETIRED  Social Needs  . Financial resource strain: Not hard at all  . Food insecurity:    Worry: Never true    Inability: Never true  . Transportation needs:    Medical: No    Non-medical: No  Tobacco Use  . Smoking status: Never Smoker  . Smokeless tobacco: Never Used  Substance and Sexual Activity  . Alcohol use: Yes    Alcohol/week: 0.6 - 1.2 oz    Types: 1 - 2 Cans of beer per week    Comment: 2 beers a week  .  Drug use: No  . Sexual activity: Yes    Partners: Female  Lifestyle  . Physical activity:    Days per week: 5 days    Minutes per session: 30 min  . Stress: Not at all  Relationships  . Social connections:    Talks on phone: More than three times a week    Gets together: More than three times a week    Attends religious service: More than 4 times per year    Active member of club or organization: No    Attends meetings of clubs or organizations: Never    Relationship status: Married  Other Topics Concern  . Not on file  Social History Narrative   Lives with wife [place] from [place]. Lived for 20 years.    Retired from [employer].   Plays golf.   Works with [employer] and [employer] for [employer].   Wears seatbelt.   Drives.   Eats all food groups.   Never smoked.   Married for over 50 years.   Has two grown children, live inn [place] and [place].   Current Outpatient Medications on File Prior to Visit  Medication Sig Dispense Refill  . amLODipine (NORVASC) 10 MG tablet Take 1 tablet (10 mg total) by mouth daily. 180 tablet 3  . celecoxib (CELEBREX) 200 MG capsule Take 200 mg by mouth daily as needed for mild pain.     Marland Kitchen dofetilide (TIKOSYN) 500 MCG capsule Take 1 capsule (500 mcg total) by mouth 2 (two) times daily. 180 capsule 1  . ELIQUIS 5 MG TABS tablet TAKE 1 TABLET BY MOUTH TWO  TIMES DAILY 180 tablet 3  . finasteride (PROSCAR) 5 MG tablet Take 5 mg by mouth  daily.    . fluticasone (FLONASE) 50 MCG/ACT nasal spray Place 1 spray into both nostrils daily as needed for allergies. 48 g 3  . gabapentin (NEURONTIN) 300 MG capsule Take 300 mg by mouth 2 (two) times daily.     Marland Kitchen levothyroxine (SYNTHROID, LEVOTHROID) 75 MCG tablet Take 1 tablet (75 mcg total) by mouth daily. 90 tablet 3  . loratadine (CLARITIN) 10 MG tablet Take 10 mg by mouth daily as needed for allergies.    Marland Kitchen losartan (COZAAR) 100 MG tablet Take 1 tablet (100 mg total) by mouth daily. 90 tablet 3  . Melatonin 10 MG TABS Take 10 mg by mouth at bedtime.    . Omega-3 Fatty Acids (RA FISH OIL) 1400 MG CPDR Take 1,400 mg by mouth daily.    . pantoprazole (PROTONIX) 40 MG tablet Take 1 tablet (40 mg total) by mouth daily. 90 tablet 3  . Polyethyl Glycol-Propyl Glycol (SYSTANE) 0.4-0.3 % SOLN Place 1 drop into both eyes 2 (two) times daily as needed (dry eyes).     . predniSONE (DELTASONE) 20 MG tablet One tab twice a day for 3 days then daily until return 20 tablet 0  . propranolol (INDERAL) 10 MG tablet Take 1 tablet (10 mg total) daily by mouth. 90 tablet 3  . Tamsulosin HCl (FLOMAX) 0.4 MG CAPS Take 0.4 mg by mouth daily after breakfast.     . Turmeric 500 MG CAPS Take 500 mg by mouth daily.    . vitamin B-12 (CYANOCOBALAMIN) 500 MCG tablet Take 1,000 mcg by mouth daily.      No current facility-administered medications on file prior to visit.     Review of Systems  Constitutional: Negative for  appetite change, chills, fever and unexpected weight change.       Does not feel tired but feels a little more sluggish than usual per his report.  HENT: Negative for trouble swallowing and voice change.   Eyes: Negative for visual disturbance.  Respiratory: Negative for cough, chest tightness, shortness of breath and wheezing.   Cardiovascular: Negative for chest pain, palpitations and leg swelling.  Gastrointestinal: Negative for abdominal pain, diarrhea, nausea and vomiting.  Genitourinary:  Negative for decreased urine volume, dysuria and frequency.  Skin: Negative for rash.  Neurological: Negative for dizziness, tremors, syncope, facial asymmetry, weakness and headaches.  Hematological: Negative for adenopathy. Does not bruise/bleed easily.     Objective:   BP 124/86 (BP Location: Left Arm, Patient Position: Sitting, Cuff Size: Normal)   Pulse 69   Temp 98 F (36.7 C) (Temporal)   Resp 16   Ht 5' 10.5" (1.791 m)   Wt 199 lb 0.6 oz (90.3 kg)   SpO2 98%   BMI 28.16 kg/m   Physical Exam  Constitutional: He is oriented to person, place, and time. He appears well-developed and well-nourished.  HENT:  Head: Normocephalic and atraumatic.  Eyes: Pupils are equal, round, and reactive to light. EOM are normal.  Neck: Normal range of motion. Neck supple. No JVD present. No tracheal deviation present. No thyromegaly present.  Cardiovascular: Normal rate, regular rhythm and normal heart sounds.  Pulmonary/Chest: Effort normal and breath sounds normal.  Abdominal: Soft. Bowel sounds are normal.  Musculoskeletal: Normal range of motion. He exhibits no edema.  Lymphadenopathy:    He has no cervical adenopathy.  Neurological: He is alert and oriented to person, place, and time. No cranial nerve deficit. Coordination normal.  Skin: Skin is warm, dry and intact. Capillary refill takes less than 2 seconds.  Psychiatric: He has a normal mood and affect. His behavior is normal. Thought content normal.  Vitals reviewed.    Assessment and Plan  1. Hypothyroidism, unspecified type Check labs. Continue synthroid as directed. Patient counseled in detail regarding the risks of medication. Told to call or return to clinic if develop any worrisome signs or symptoms. Patient voiced understanding.  - TSH  2. Hyperglycemia Evidence of elevated blood sugars on previous labs.  - Hemoglobin A1c  3. Immunization due - Pneumococcal polysaccharide vaccine 23-valent greater than or equal to  2yo subcutaneous/IM  4. Polyneuropathy - Vitamin B12  5 .  Hypertension Blood pressure well controlled with increased dose of amlodipine.  Continue to monitor blood pressures at home.  Salt intake monitoring recommended.  Continue with exercise.  Patient was told that he can follow-up in 3 months or sooner if needed.  If his energy level worsens or he develops any other side effects, he should contact our office.  He should call with any questions, concerns, or worrisome symptoms.  He should keep regularly scheduled visits with cardiology and urology. Return in about 3 months (around 04/04/2018) for follow up . Caren Macadam, MD 01/02/2018

## 2018-01-03 LAB — VITAMIN B12

## 2018-01-03 LAB — HEMOGLOBIN A1C
EAG (MMOL/L): 6.3 (calc)
HEMOGLOBIN A1C: 5.6 %{Hb} (ref <5.7)
Mean Plasma Glucose: 114 (calc)

## 2018-01-03 LAB — TSH: TSH: 3.06 mIU/L (ref 0.40–4.50)

## 2018-01-07 ENCOUNTER — Encounter: Payer: Self-pay | Admitting: Family Medicine

## 2018-01-08 ENCOUNTER — Encounter: Payer: Self-pay | Admitting: Family Medicine

## 2018-01-09 ENCOUNTER — Encounter: Payer: Self-pay | Admitting: Family Medicine

## 2018-01-19 ENCOUNTER — Telehealth: Payer: Self-pay | Admitting: Internal Medicine

## 2018-01-19 NOTE — Telephone Encounter (Signed)
Patient is going to switch and take amlodipine in the am, and take losartan at night to see if he has more energy.Systolic BP's running 768'T.He will call back later this week with update

## 2018-01-19 NOTE — Telephone Encounter (Signed)
Pt lvm stating he's "feeling washed out" had medication changes at LOV-- please give him a call [phone]

## 2018-02-09 ENCOUNTER — Telehealth: Payer: Self-pay | Admitting: Internal Medicine

## 2018-02-09 NOTE — Telephone Encounter (Signed)
Per pt phone call --pt has been having spells of being light headed while he's out and about. He thinks it's due to his 2 different BP meds, states BP is low.   He's wanting to be seen this week, will be on vacation next week.

## 2018-02-10 NOTE — Telephone Encounter (Signed)
Spoke with pt who gave BP readings of  6/29  125/72 7/1    135/78 7/3     144/82 7/5     102/56 7/8     104/65 Pt states that he is still feeling "washed out" . Please advise.

## 2018-02-10 NOTE — Telephone Encounter (Signed)
Pt called to leave cell # that he can be reached on [phone]

## 2018-02-10 NOTE — Telephone Encounter (Signed)
Confirm meds  I would back down on amlodipine to 5 and losartan to 50 mg

## 2018-02-11 MED ORDER — LOSARTAN POTASSIUM 50 MG PO TABS: 50.0000 mg | ORAL_TABLET | Freq: Every day | ORAL | 3 refills | Status: DC

## 2018-02-11 MED ORDER — AMLODIPINE BESYLATE 5 MG PO TABS: 5.0000 mg | ORAL_TABLET | Freq: Every day | ORAL | 3 refills | Status: DC

## 2018-02-11 NOTE — Telephone Encounter (Signed)
Pt made aware. Sent in new RX to [facility] on [address]. Placed bp tracking sheet out front for pt to pick up.

## 2018-03-24 ENCOUNTER — Ambulatory Visit: Payer: Medicare Other | Admitting: Gastroenterology

## 2018-03-27 ENCOUNTER — Ambulatory Visit: Payer: Medicare Other | Admitting: Gastroenterology

## 2018-03-27 ENCOUNTER — Encounter: Payer: Self-pay | Admitting: Gastroenterology

## 2018-03-27 VITALS — BP 144/88 | HR 69 | Temp 97.0°F | Ht 70.0 in | Wt 200.8 lb

## 2018-03-27 DIAGNOSIS — K219 Gastro-esophageal reflux disease without esophagitis: Secondary | ICD-10-CM

## 2018-03-27 NOTE — Progress Notes (Signed)
CC'ED TO PCP 

## 2018-03-27 NOTE — Patient Instructions (Signed)
Continue Protonix once each morning, 30 minutes before breakfast. It's best absorbed this way.   We will see you back in 1 year! Please call with any issues in the meantime!    It was a pleasure to see you today. I strive to create trusting relationships with patients to provide genuine, compassionate, and quality care. I value your feedback. If you receive a survey regarding your visit,  I greatly appreciate you taking time to fill this out.   Annitta Needs, PhD, ANP-BC [facility]

## 2018-03-27 NOTE — Assessment & Plan Note (Signed)
Continue Protonix once daily and ensure taking 30 minutes before breakfast. Dysphagia resolved after dilation of Schatzki's ring earlier this year. Doing well overall. If any recurrence, call. May need BPE. Otherwise, return in 1 year.

## 2018-03-27 NOTE — Progress Notes (Signed)
Referring Provider: Raylene Everts, MD Primary Care Physician:  Caren Macadam, MD  Primary GI: Dr. Gala Romney   Chief Complaint  Patient presents with  . Dysphagia  . Gastroesophageal Reflux    HPI:   Brent Wilkins is a 77 y.o. male presenting today with a history of GERD and dysphagia, s/p EGD in Feb 2019 with Schatzki's ring s/p dilation, mild reflux esophagitis.   Protonix once daily with breakfast. No issues with solid food. Notes dysphagia with cold water, otherwise no issues with liquids. NO abdominal pain, N/V, good appetite. No lower GI concerns. Does not want to pursue any other additional colonoscopies.    Past Medical History:  Diagnosis Date  . Allergy    hay fever  . Arthritis    right ankle  . Cataract    surgery 2016  . Chronic kidney disease    stones many years ago  . Dizziness 12/13/2015  . Dysphagia   . Frequent urination at night   . GERD (gastroesophageal reflux disease)   . History of kidney stones   . HTN (hypertension)   . Hypothyroidism   . 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 Neuropathy    Bilateral ankles  . Paroxysmal atrial fibrillation (HCC)    a. on Tikosyn and Eliquis  . Polyneuropathy 06/16/2017  . Prostate cancer [facility]) 2010   Dr. Rosana Hoes  . Schatzki's ring 10/22/2011  . Seasonal allergies   . Syncope  dx. 8 yrs ago    Past Surgical History:  Procedure Laterality Date  . APPENDECTOMY    . CATARACT EXTRACTION W/PHACO Left 03/27/2015   Procedure: CATARACT EXTRACTION PHACO AND INTRAOCULAR LENS PLACEMENT LEFT EYE CDE=11.73;  Surgeon: Tonny Branch, MD;  [facility]: [facility];  Service: Ophthalmology;  Laterality: Left;  . CATARACT EXTRACTION W/PHACO Right 03/30/2015   Procedure: CATARACT EXTRACTION PHACO AND INTRAOCULAR LENS PLACEMENT RIGHT EYE CDE=7.35;  Surgeon: Tonny Branch, MD;  Location: [facility];  Service: Ophthalmology;  Laterality: Right;  . CHOLECYSTECTOMY    . COLONOSCOPY  07/23/10   Dr. Vivi Ferns rectum, scattered pancolonic  diverticula  . COLONOSCOPY  08/10/1999   internal hemorrhoids,inflammatory polyp  . ESOPHAGOGASTRODUODENOSCOPY  04/12/08   Prominent Schatzki's ring, erosive reflux esophagitis, multiple antral erosions, small hiatal hernia, reactive gastropathy, status post dilation with 48F  . ESOPHAGOGASTRODUODENOSCOPY  10/28/2011   Procedure: ESOPHAGOGASTRODUODENOSCOPY (EGD);  Surgeon: Daneil Dolin, MD;  [facility];  Service: Endoscopy;  Laterality: N/A;  1:30  . ESOPHAGOGASTRODUODENOSCOPY N/A 09/10/2017   Dr. Gala Romney: Schatkzi's ring s/p dilation at GE junction, mild esophageal reflux esophagitis, medium sized hiatal hernia, normal duodenum. 56 and 37 Fr dilation  . EYE SURGERY    . MALONEY DILATION  10/28/2011   Procedure: Venia Minks DILATION;  Surgeon: Daneil Dolin, MD;  [facility];  Service: Endoscopy;  Laterality: N/A;  . Venia Minks DILATION N/A 09/10/2017   Procedure: Venia Minks DILATION;  Surgeon: Daneil Dolin, MD;  [facility];  Service: Endoscopy;  Laterality: N/A;  . SAVORY DILATION  10/28/2011   Procedure: SAVORY DILATION;  Surgeon: Daneil Dolin, MD;  [facility];  Service: Endoscopy;  Laterality: N/A;  . TONSILLECTOMY    . TOTAL ANKLE ARTHROPLASTY Left 11/25/2013   Procedure: TOTAL ANKLE ARTHOPLASTY;  Surgeon: Wylene Simmer, MD;  Location: [place];  Service: Orthopedics;  Laterality: Left;  . TOTAL ANKLE ARTHROPLASTY Right 07/14/2014   dr hewitt  . TOTAL ANKLE ARTHROPLASTY  Right 07/14/2014   Procedure: RIGHT TOTAL ANKLE ARTHOPLASTY;  Surgeon: Wylene Simmer, MD;  Location: [place];  Service: Orthopedics;  Laterality: Right;    Current Outpatient Medications  Medication Sig Dispense Refill  . amLODipine (NORVASC) 5 MG tablet Take 1 tablet (5 mg total) by mouth daily. 90 tablet 3  . celecoxib (CELEBREX) 200 MG capsule Take 200 mg by mouth daily as needed for mild pain.     Marland Kitchen dofetilide (TIKOSYN) 500 MCG capsule Take 1 capsule (500 mcg total) by mouth 2 (two) times  daily. 180 capsule 1  . ELIQUIS 5 MG TABS tablet TAKE 1 TABLET BY MOUTH TWO  TIMES DAILY 180 tablet 3  . finasteride (PROSCAR) 5 MG tablet Take 5 mg by mouth daily.    . fluticasone (FLONASE) 50 MCG/ACT nasal spray Place 1 spray into both nostrils daily as needed for allergies. 48 g 3  . gabapentin (NEURONTIN) 300 MG capsule Take 300 mg by mouth 2 (two) times daily.     Marland Kitchen levothyroxine (SYNTHROID, LEVOTHROID) 75 MCG tablet Take 1 tablet (75 mcg total) by mouth daily. 90 tablet 3  . loratadine (CLARITIN) 10 MG tablet Take 10 mg by mouth daily as needed for allergies.    Marland Kitchen losartan (COZAAR) 50 MG tablet Take 1 tablet (50 mg total) by mouth daily. 90 tablet 3  . Melatonin 10 MG TABS Take 10 mg by mouth at bedtime.    . Omega-3 Fatty Acids (RA FISH OIL) 1400 MG CPDR Take 1,400 mg by mouth daily.    . pantoprazole (PROTONIX) 40 MG tablet Take 1 tablet (40 mg total) by mouth daily. 90 tablet 3  . Polyethyl Glycol-Propyl Glycol (SYSTANE) 0.4-0.3 % SOLN Place 1 drop into both eyes 2 (two) times daily as needed (dry eyes).     . propranolol (INDERAL) 10 MG tablet Take 1 tablet (10 mg total) daily by mouth. 90 tablet 3  . Tamsulosin HCl (FLOMAX) 0.4 MG CAPS Take 0.4 mg by mouth daily after breakfast.     . Turmeric 500 MG CAPS Take 500 mg by mouth daily.    . vitamin B-12 (CYANOCOBALAMIN) 500 MCG tablet Take 1,000 mcg by mouth daily.     . predniSONE (DELTASONE) 20 MG tablet One tab twice a day for 3 days then daily until return (Patient not taking: Reported on 03/27/2018) 20 tablet 0   No current facility-administered medications for this visit.     Allergies as of 03/27/2018 - Review Complete 03/27/2018  Allergen Reaction Noted  . Tetanus toxoids Swelling 05/10/2013  . Statins Other (See Comments) 05/10/2013  . Doxycycline Rash 09/30/2011  . Oxycodone Other (See Comments) 07/06/2014  . Penicillins Swelling and Rash 10/22/2011  . Sulfa antibiotics Swelling and Rash 10/22/2011  . Xarelto  [rivaroxaban] Rash 06/16/2017    Family History  Problem Relation Age of Onset  . Prostate cancer Father   . Cancer Father        prostate to bone  . Hypertension Father   . Prostate cancer Son 34  . ALS Son   . Cancer Son 76       prostate cancer  . Prostate cancer Brother   . Cancer Brother        prostate  . Heart disease Mother        died at 39  . Arthritis Mother   . Colon cancer Neg Hx     Social History   Socioeconomic History  . Marital status: Married  Spouse name: Candace  . Number of children: 2  . Years of education: Not on file  . Highest education level: Bachelor's degree (e.g., BA, AB, BS)  Occupational History  . Occupation: retired; Ambulance person: RETIRED  Social Needs  . Financial resource strain: Not hard at all  . Food insecurity:    Worry: Never true    Inability: Never true  . Transportation needs:    Medical: No    Non-medical: No  Tobacco Use  . Smoking status: Never Smoker  . Smokeless tobacco: Never Used  Substance and Sexual Activity  . Alcohol use: Yes    Alcohol/week: 1.0 - 2.0 standard drinks    Types: 1 - 2 Cans of beer per week    Comment: 2 beers a week  . Drug use: No  . Sexual activity: Yes    Partners: Female  Lifestyle  . Physical activity:    Days per week: 5 days    Minutes per session: 30 min  . Stress: Not at all  Relationships  . Social connections:    Talks on phone: More than three times a week    Gets together: More than three times a week    Attends religious service: More than 4 times per year    Active member of club or organization: No    Attends meetings of clubs or organizations: Never    Relationship status: Married  Other Topics Concern  . Not on file  Social History Narrative   Lives with wife [place] from [place]. Lived for 20 years.    Retired from [employer].   Plays golf.   Works with [employer] and [employer] for [employer].   Wears seatbelt.   Drives.   Eats all  food groups.   Never smoked.   Married for over 50 years.   Has two grown children, live inn [place] and [place].    Review of Systems: Gen: Denies fever, chills, anorexia. Denies fatigue, weakness, weight loss.  CV: Denies chest pain, palpitations, syncope, peripheral edema, and claudication. Resp: Denies dyspnea at rest, cough, wheezing, coughing up blood, and pleurisy. GI: see HPI  Derm: Denies rash, itching, dry skin Psych: Denies depression, anxiety, memory loss, confusion. No homicidal or suicidal ideation.  Heme: Denies bruising, bleeding, and enlarged lymph nodes.  Physical Exam: BP (!) 144/88   Pulse 69   Temp (!) 97 F (36.1 C) (Oral)   Ht 5\' 10"  (1.778 m)   Wt 200 lb 12.8 oz (91.1 kg)   BMI 28.81 kg/m  General:   Alert and oriented. No distress noted. Pleasant and cooperative.  Head:  Normocephalic and atraumatic. Eyes:  Conjuctiva clear without scleral icterus. Mouth:  Oral mucosa pink and moist.  Abdomen:  +BS, soft, non-tender and non-distended. No rebound or guarding. No HSM or masses noted. Msk:  Symmetrical without gross deformities. Normal posture. Extremities:  Without edema. Neurologic:  Alert and  oriented x4 Psych:  Alert and cooperative. Normal mood and affect.

## 2018-03-30 ENCOUNTER — Telehealth: Payer: Self-pay

## 2018-03-30 ENCOUNTER — Telehealth: Payer: Self-pay | Admitting: Family Medicine

## 2018-03-30 NOTE — Telephone Encounter (Signed)
Patient would like to have labs drawn prior to visit. He changed his visit from a follow up to a CPE scheduled for 04/07/18. What labs would you like to have drawn if any?

## 2018-03-30 NOTE — Telephone Encounter (Signed)
Pt got stung 4-5 times with yellow jackets, question can he take Tikosyn.Per Dr Harl Bowie ok to take benadryl

## 2018-03-30 NOTE — Telephone Encounter (Signed)
Patient changed his Follow up to a CPE --and wants LAB work prior to visit... Can you order that and call the patient to advise --thanks

## 2018-03-31 ENCOUNTER — Telehealth: Payer: Self-pay | Admitting: Family Medicine

## 2018-03-31 DIAGNOSIS — R739 Hyperglycemia, unspecified: Secondary | ICD-10-CM

## 2018-03-31 DIAGNOSIS — Z79899 Other long term (current) drug therapy: Secondary | ICD-10-CM

## 2018-03-31 DIAGNOSIS — E039 Hypothyroidism, unspecified: Secondary | ICD-10-CM

## 2018-03-31 DIAGNOSIS — I10 Essential (primary) hypertension: Secondary | ICD-10-CM

## 2018-03-31 NOTE — Telephone Encounter (Signed)
Left message for patient to return my call regarding blood work.

## 2018-03-31 NOTE — Telephone Encounter (Signed)
Please advised that I have ordered his blood work.  However, I cannot promise that there would not need to be additional blood work done after we meet, discuss his health history, and how he is doing.  However, I have ordered blood work that would be routine based upon his age and prior history.  He should get the blood work done fasting several days prior to his office visit.

## 2018-04-03 LAB — CBC WITH DIFFERENTIAL/PLATELET
BASOS PCT: 0.7 %
Basophils Absolute: 50 cells/uL (ref 0–200)
EOS ABS: 562 {cells}/uL — AB (ref 15–500)
Eosinophils Relative: 7.8 %
HEMATOCRIT: 44.3 % (ref 38.5–50.0)
HEMOGLOBIN: 15.3 g/dL (ref 13.2–17.1)
LYMPHS ABS: 2837 {cells}/uL (ref 850–3900)
MCH: 31.3 pg (ref 27.0–33.0)
MCHC: 34.5 g/dL (ref 32.0–36.0)
MCV: 90.6 fL (ref 80.0–100.0)
MPV: 9.5 fL (ref 7.5–12.5)
Monocytes Relative: 6.5 %
NEUTROS PCT: 45.6 %
Neutro Abs: 3283 cells/uL (ref 1500–7800)
Platelets: 239 10*3/uL (ref 140–400)
RBC: 4.89 10*6/uL (ref 4.20–5.80)
RDW: 13.1 % (ref 11.0–15.0)
Total Lymphocyte: 39.4 %
WBC: 7.2 10*3/uL (ref 3.8–10.8)
WBCMIX: 468 {cells}/uL (ref 200–950)

## 2018-04-03 LAB — COMPLETE METABOLIC PANEL WITH GFR
AG Ratio: 1.5 (calc) (ref 1.0–2.5)
ALBUMIN MSPROF: 4 g/dL (ref 3.6–5.1)
ALT: 9 U/L (ref 9–46)
AST: 20 U/L (ref 10–35)
Alkaline phosphatase (APISO): 72 U/L (ref 40–115)
BUN/Creatinine Ratio: 15 (calc) (ref 6–22)
BUN: 20 mg/dL (ref 7–25)
CALCIUM: 9.5 mg/dL (ref 8.6–10.3)
CO2: 28 mmol/L (ref 20–32)
CREATININE: 1.3 mg/dL — AB (ref 0.70–1.18)
Chloride: 108 mmol/L (ref 98–110)
GFR, EST AFRICAN AMERICAN: 60 mL/min/{1.73_m2} (ref >=60)
GFR, EST NON AFRICAN AMERICAN: 52 mL/min/{1.73_m2} — AB (ref >=60)
GLOBULIN: 2.7 g/dL (ref 1.9–3.7)
Glucose, Bld: 104 mg/dL — ABNORMAL HIGH (ref 65–99)
Potassium: 4.2 mmol/L (ref 3.5–5.3)
SODIUM: 143 mmol/L (ref 135–146)
TOTAL PROTEIN: 6.7 g/dL (ref 6.1–8.1)
Total Bilirubin: 0.6 mg/dL (ref 0.2–1.2)

## 2018-04-03 LAB — LIPID PANEL
Cholesterol: 179 mg/dL (ref <200)
HDL: 45 mg/dL (ref >40)
LDL CHOLESTEROL (CALC): 104 mg/dL — AB
NON-HDL CHOLESTEROL (CALC): 134 mg/dL — AB (ref <130)
TRIGLYCERIDES: 181 mg/dL — AB (ref <150)
Total CHOL/HDL Ratio: 4 (calc) (ref <5.0)

## 2018-04-03 LAB — HEMOGLOBIN A1C
EAG (MMOL/L): 6 (calc)
HEMOGLOBIN A1C: 5.4 %{Hb} (ref <5.7)
MEAN PLASMA GLUCOSE: 108 (calc)

## 2018-04-03 LAB — TSH: TSH: 3.5 mIU/L (ref 0.40–4.50)

## 2018-04-03 NOTE — Telephone Encounter (Signed)
Called patient and he had labs drawn yesterday.

## 2018-04-07 ENCOUNTER — Other Ambulatory Visit: Payer: Self-pay

## 2018-04-07 ENCOUNTER — Ambulatory Visit (INDEPENDENT_AMBULATORY_CARE_PROVIDER_SITE_OTHER): Payer: Medicare Other | Admitting: Family Medicine

## 2018-04-07 ENCOUNTER — Encounter: Payer: Self-pay | Admitting: Family Medicine

## 2018-04-07 ENCOUNTER — Ambulatory Visit: Payer: Medicare Other | Admitting: Family Medicine

## 2018-04-07 VITALS — BP 150/80 | HR 68 | Temp 97.7°F | Resp 12 | Ht 70.0 in | Wt 202.0 lb

## 2018-04-07 DIAGNOSIS — Z23 Encounter for immunization: Secondary | ICD-10-CM | POA: Diagnosis not present

## 2018-04-07 DIAGNOSIS — R7989 Other specified abnormal findings of blood chemistry: Secondary | ICD-10-CM

## 2018-04-07 DIAGNOSIS — Z0000 Encounter for general adult medical examination without abnormal findings: Secondary | ICD-10-CM | POA: Diagnosis not present

## 2018-04-07 LAB — BASIC METABOLIC PANEL
BUN / CREAT RATIO: 15 (calc) (ref 6–22)
BUN: 19 mg/dL (ref 7–25)
CO2: 32 mmol/L (ref 20–32)
CREATININE: 1.3 mg/dL — AB (ref 0.70–1.18)
Calcium: 9.6 mg/dL (ref 8.6–10.3)
Chloride: 107 mmol/L (ref 98–110)
Glucose, Bld: 67 mg/dL (ref 65–139)
POTASSIUM: 4.9 mmol/L (ref 3.5–5.3)
SODIUM: 143 mmol/L (ref 135–146)

## 2018-04-07 NOTE — Patient Instructions (Addendum)
Do not take celebrex and advil/ibuprofen/alleve at the same time/day.   Claritin 10 mg, try it each day.  Flonase only works if you use it regularly, it does not work on an as needed basis.  B2/Riboflavin 400 mg  You may stop the B12  Dentist visits every six months Keep eye doctor appointments   Keep visits with dermatology every six months.  You need potassium/kidney tests every six months.   Get your flu shot in 05/2018.  Get you next shingrix shot as directed.

## 2018-04-07 NOTE — Progress Notes (Signed)
Patient ID: Brent Wilkins, male    DOB: 10-30-1940, 77 y.o.   MRN: [id number]  Chief Complaint  Patient presents with  . Annual Exam    Allergies Tetanus toxoids; Statins; Doxycycline; Oxycodone; Penicillins; Sulfa antibiotics; and Xarelto [rivaroxaban]  Subjective:   Brent Wilkins is a 77 y.o. male who presents to [facility] today.  HPI Here for CPE. Is doing well per his report.  Reports that he was seen by cardiology on 02/12/2018 and that they changed his BP medications and doubled it. Reports that he felt much worse after the increase and that they subsequently decreased his BP medication and he feels better. Is not having the lightheadedness or dizzy symptoms. Sees the cardiologist again in 05/2018.   Followed by Dr. Rosana Hoes for prostate cancer. Saw him in May for prostate cancer follow up. PSA was checked at that time and was within normal limits.  Sees audiology/ENT. Wears hearing aids. Seen by gastroenterology recently. Had EGD in 09/2017 and doing well. Taking protonix qd.  Followed by dermatology every 6 months.  Reports has had many precancerous skin lesions frozen in the past. Reports that he is doing well.  No complaints today.  Is been active in church and community.  Energy is good.  Has not decided where he is going to follow-up for his new PCP care.  Would like to review his blood work that he had prior to coming in today.  He is unable to take statins.  His LDL is approximately 100.  Now at a the prediabetic range.  Liver test within normal limits.  Electrolytes within normal limits.     Past Medical History:  Diagnosis Date  . Allergy    hay fever  . Arthritis    right ankle  . Cataract    surgery 2016  . Chronic kidney disease    stones many years ago  . Dizziness 12/13/2015  . Dysphagia   . Frequent urination at night   . GERD (gastroesophageal reflux disease)   . History of kidney stones   . HTN (hypertension)   . Hypothyroidism   .  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 Neuropathy    Bilateral ankles  . Paroxysmal atrial fibrillation (HCC)    a. on Tikosyn and Eliquis  . Polyneuropathy 06/16/2017  . Prostate cancer [facility]) 2010   Dr. Rosana Hoes  . Schatzki's ring 10/22/2011  . Seasonal allergies   . Syncope  dx. 8 yrs ago    Past Surgical History:  Procedure Laterality Date  . APPENDECTOMY    . CATARACT EXTRACTION W/PHACO Left 03/27/2015   Procedure: CATARACT EXTRACTION PHACO AND INTRAOCULAR LENS PLACEMENT LEFT EYE CDE=11.73;  Surgeon: Tonny Branch, MD;  Location: [facility];  Service: Ophthalmology;  Laterality: Left;  . CATARACT EXTRACTION W/PHACO Right 03/30/2015   Procedure: CATARACT EXTRACTION PHACO AND INTRAOCULAR LENS PLACEMENT RIGHT EYE CDE=7.35;  Surgeon: Tonny Branch, MD;  Location: [facility];  Service: Ophthalmology;  Laterality: Right;  . CHOLECYSTECTOMY    . COLONOSCOPY  07/23/10   Dr. Vivi Ferns rectum, scattered pancolonic diverticula  . COLONOSCOPY  08/10/1999   internal hemorrhoids,inflammatory polyp  . ESOPHAGOGASTRODUODENOSCOPY  04/12/08   Prominent Schatzki's ring, erosive reflux esophagitis, multiple antral erosions, small hiatal hernia, reactive gastropathy, status post dilation with 45F  . ESOPHAGOGASTRODUODENOSCOPY  10/28/2011   Procedure: ESOPHAGOGASTRODUODENOSCOPY (EGD);  Surgeon: Daneil Dolin, MD;  Location: [facility];  Service: Endoscopy;  Laterality: N/A;  1:30  .  ESOPHAGOGASTRODUODENOSCOPY N/A 09/10/2017   Dr. Gala Romney: Schatkzi's ring s/p dilation at GE junction, mild esophageal reflux esophagitis, medium sized hiatal hernia, normal duodenum. 56 and 31 Fr dilation  . EYE SURGERY    . MALONEY DILATION  10/28/2011   Procedure: Venia Minks DILATION;  Surgeon: Daneil Dolin, MD;  Location: [facility];  Service: Endoscopy;  Laterality: N/A;  . Venia Minks DILATION N/A 09/10/2017   Procedure: Venia Minks DILATION;  Surgeon: Daneil Dolin, MD;  Location: [facility];  Service: Endoscopy;  Laterality: N/A;  . SAVORY  DILATION  10/28/2011   Procedure: SAVORY DILATION;  Surgeon: Daneil Dolin, MD;  Location: [facility];  Service: Endoscopy;  Laterality: N/A;  . TONSILLECTOMY    . TOTAL ANKLE ARTHROPLASTY Left 11/25/2013   Procedure: TOTAL ANKLE ARTHOPLASTY;  Surgeon: Wylene Simmer, MD;  Location: [place];  Service: Orthopedics;  Laterality: Left;  . TOTAL ANKLE ARTHROPLASTY Right 07/14/2014   dr hewitt  . TOTAL ANKLE ARTHROPLASTY Right 07/14/2014   Procedure: RIGHT TOTAL ANKLE ARTHOPLASTY;  Surgeon: Wylene Simmer, MD;  Location: [place];  Service: Orthopedics;  Laterality: Right;    Family History  Problem Relation Age of Onset  . Prostate cancer Father   . Cancer Father        prostate to bone  . Hypertension Father   . Prostate cancer Son 62  . ALS Son   . Cancer Son 43       prostate cancer  . Prostate cancer Brother   . Cancer Brother        prostate  . Heart disease Mother        died at 42  . Arthritis Mother   . Colon cancer Neg Hx      Social History   Socioeconomic History  . Marital status: Married    Spouse name: Candace  . Number of children: 2  . Years of education: Not on file  . Highest education level: Bachelor's degree (e.g., BA, AB, BS)  Occupational History  . Occupation: retired; Ambulance person: RETIRED  Social Needs  . Financial resource strain: Not hard at all  . Food insecurity:    Worry: Never true    Inability: Never true  . Transportation needs:    Medical: No    Non-medical: No  Tobacco Use  . Smoking status: Never Smoker  . Smokeless tobacco: Never Used  Substance and Sexual Activity  . Alcohol use: Yes    Alcohol/week: 1.0 - 2.0 standard drinks    Types: 1 - 2 Cans of beer per week    Comment: 2 beers a week  . Drug use: No  . Sexual activity: Yes    Partners: Female  Lifestyle  . Physical activity:    Days per week: 5 days    Minutes per session: 30 min  . Stress: Not at all  Relationships  . Social connections:    Talks on  phone: More than three times a week    Gets together: More than three times a week    Attends religious service: More than 4 times per year    Active member of club or organization: No    Attends meetings of clubs or organizations: Never    Relationship status: Married  Other Topics Concern  . Not on file  Social History Narrative   Lives with wife Candace.          Moved from [place]. Lived here for  20 years.    Retired from [employer].   Plays golf.   Works with [employer] and [employer] for [employer].   Wears seatbelt.   Drives.   Eats all food groups.   Never smoked.   Married for over 50 years.    Has two grown children, live in [place] and [place].   Current Outpatient Medications on File Prior to Visit  Medication Sig Dispense Refill  . amLODipine (NORVASC) 5 MG tablet Take 1 tablet (5 mg total) by mouth daily. 90 tablet 3  . celecoxib (CELEBREX) 200 MG capsule Take 200 mg by mouth daily as needed for mild pain.     Marland Kitchen dofetilide (TIKOSYN) 500 MCG capsule Take 1 capsule (500 mcg total) by mouth 2 (two) times daily. 180 capsule 1  . ELIQUIS 5 MG TABS tablet TAKE 1 TABLET BY MOUTH TWO  TIMES DAILY 180 tablet 3  . finasteride (PROSCAR) 5 MG tablet Take 5 mg by mouth daily.    . fluticasone (FLONASE) 50 MCG/ACT nasal spray Place 1 spray into both nostrils daily as needed for allergies. 48 g 3  . gabapentin (NEURONTIN) 300 MG capsule Take 300 mg by mouth 2 (two) times daily.     Marland Kitchen levothyroxine (SYNTHROID, LEVOTHROID) 75 MCG tablet Take 1 tablet (75 mcg total) by mouth daily. 90 tablet 3  . loratadine (CLARITIN) 10 MG tablet Take 10 mg by mouth daily as needed for allergies.    Marland Kitchen losartan (COZAAR) 50 MG tablet Take 1 tablet (50 mg total) by mouth daily. 90 tablet 3  . Melatonin 10 MG TABS Take 10 mg by mouth at bedtime.    . Omega-3 Fatty Acids (RA FISH OIL) 1400 MG CPDR Take 1,400 mg by mouth daily.    . pantoprazole (PROTONIX) 40 MG tablet Take 1 tablet (40 mg total) by mouth daily. 90  tablet 3  . Polyethyl Glycol-Propyl Glycol (SYSTANE) 0.4-0.3 % SOLN Place 1 drop into both eyes 2 (two) times daily as needed (dry eyes).     . propranolol (INDERAL) 10 MG tablet Take 1 tablet (10 mg total) daily by mouth. 90 tablet 3  . Tamsulosin HCl (FLOMAX) 0.4 MG CAPS Take 0.4 mg by mouth daily after breakfast.     . Turmeric 500 MG CAPS Take 500 mg by mouth daily.    . vitamin B-12 (CYANOCOBALAMIN) 500 MCG tablet Take 1,000 mcg by mouth daily.      No current facility-administered medications on file prior to visit.     Review of Systems  Constitutional: Negative for activity change, appetite change, chills, fatigue, fever and unexpected weight change.  HENT: Negative for congestion, dental problem, drooling, ear discharge, hearing loss, sinus pressure, sinus pain, sore throat, trouble swallowing and voice change.        Wears hearing aids.   Eyes: Negative for visual disturbance.  Respiratory: Negative for apnea, cough, chest tightness, shortness of breath and wheezing.   Cardiovascular: Negative for chest pain, palpitations and leg swelling.  Gastrointestinal: Negative for abdominal pain, blood in stool, constipation, diarrhea, nausea and vomiting.  Genitourinary: Negative for decreased urine volume, dysuria and frequency.  Musculoskeletal: Negative for gait problem and neck pain.  Skin: Negative for rash.  Neurological: Negative for dizziness, tremors, syncope, facial asymmetry, speech difficulty, weakness, light-headedness, numbness and headaches.  Hematological: Negative for adenopathy. Does not bruise/bleed easily.  Psychiatric/Behavioral: Negative for agitation, behavioral problems, decreased concentration, dysphoric mood, sleep disturbance and suicidal ideas. The patient is not nervous/anxious.  Objective:   BP (!) 150/80 (BP Location: Right Arm, Patient Position: Sitting, Cuff Size: Normal)   Pulse 68   Temp 97.7 F (36.5 C) (Temporal)   Resp 12   Ht 5\' 10"  (1.778  m)   Wt 202 lb (91.6 kg)   SpO2 98% Comment: room air  BMI 28.98 kg/m   Physical Exam  Constitutional: He is oriented to person, place, and time. He appears well-developed and well-nourished.  HENT:  Head: Normocephalic and atraumatic.  Right Ear: External ear normal.  Left Ear: External ear normal.  Nose: Nose normal.  Mouth/Throat: Oropharynx is clear and moist. No oropharyngeal exudate.  Eyes: Pupils are equal, round, and reactive to light. Conjunctivae and EOM are normal. No scleral icterus.  Neck: Normal range of motion. Neck supple. No JVD present. No tracheal deviation present. No thyromegaly present.  Cardiovascular: Normal rate, regular rhythm, normal heart sounds and intact distal pulses. Exam reveals no gallop and no friction rub.  No murmur heard. Pulmonary/Chest: Effort normal and breath sounds normal. No respiratory distress. He has no wheezes. He exhibits no tenderness.  Abdominal: Soft. Bowel sounds are normal. He exhibits no mass.  Musculoskeletal: He exhibits no edema or tenderness.  Lymphadenopathy:    He has no cervical adenopathy.  Neurological: He is alert and oriented to person, place, and time. He displays normal reflexes. No cranial nerve deficit or sensory deficit. He exhibits normal muscle tone. Coordination normal.  Skin: Skin is warm, dry and intact. Capillary refill takes less than 2 seconds.  Psychiatric: He has a normal mood and affect. His behavior is normal. Thought content normal.  Vitals reviewed.   Depression screen [facility] 2/9 04/07/2018 01/02/2018 09/05/2017 06/16/2017  Decreased Interest 0 0 0 0  Down, Depressed, Hopeless 0 0 0 0  PHQ - 2 Score 0 0 0 0    Assessment and Plan  1. Well adult exam Age appropriate anticipatory guidance given.  Health maintenance discussed. Keep scheduled visits with dermatology, cardiology, ENT/audiology. Keep visits with dentist and eye doctor.  Follow up with Dr. Morrison Old.  Patient reports he has been on B12 for  many years and is never had an improvement in his peripheral neuropathy.  Would like to stop the B12.  I think this is reasonable.  Reports that is been seen and evaluated many times in the past for chronic headaches.  Reports they are worse during his allergy season.  Has not been taking his allergy medications on a daily basis.  Was not aware that needed to take the Flonase daily for it to work.  Has not had any changes or worrisome signs and symptoms with his headaches.  Does not have a current headache.  He will change his Claritin to 10 mg a day and Flonase on a daily basis.  We did discuss use of riboflavin.  He was counseled if he develops any worrisome signs and symptoms associated with his headache that he will seek care.  Also dissuaded him from use of ibuprofen on a daily basis. Discussed risks of cardiovascular thrombotic events related to NSAIDS. Discussed increased risk of AMI and CVA. Discussed risk of serious GI adverse events including bleeding, ulcers, and perforation. Patient understands risks of this medication.  I did discuss with him that he is on Eliquis and he should not be on NSAIDs. 2. Elevated serum creatinine Suspect due to dehydration at the time of labs. Check today.  - Basic metabolic panel  3. Immunization due RTC for next  shot as directed. Flu vaccine in October, patient defers shot until October.  - Varicella-zoster vaccine subcutaneous  No follow-ups on file.  Schedule visit with new PCP physician for the next 2 to 3 months.  Call with any questions or concerns.  He will need repeat Shingrix vaccination as directed. Caren Macadam, MD 04/07/2018

## 2018-04-08 ENCOUNTER — Encounter: Payer: Self-pay | Admitting: Family Medicine

## 2018-04-13 ENCOUNTER — Telehealth: Payer: Self-pay | Admitting: Family Medicine

## 2018-04-13 NOTE — Telephone Encounter (Signed)
Please call about blood work.

## 2018-04-13 NOTE — Telephone Encounter (Signed)
Duplicate message. Pt has open message requesting call back about labwork

## 2018-04-13 NOTE — Telephone Encounter (Signed)
Please call the pt back

## 2018-04-14 ENCOUNTER — Telehealth: Payer: Self-pay | Admitting: Family Medicine

## 2018-04-14 NOTE — Telephone Encounter (Signed)
Spoke with patient and gave results. 

## 2018-04-14 NOTE — Telephone Encounter (Signed)
Patient returned your call to discuss his lab results. Cb@#: [phone]/ [phone]

## 2018-04-23 ENCOUNTER — Telehealth: Payer: Self-pay | Admitting: Internal Medicine

## 2018-04-23 MED ORDER — LOSARTAN POTASSIUM 50 MG PO TABS: 50.0000 mg | ORAL_TABLET | Freq: Every day | ORAL | 3 refills | Status: DC

## 2018-04-23 MED ORDER — AMLODIPINE BESYLATE 5 MG PO TABS: 5.0000 mg | ORAL_TABLET | Freq: Every day | ORAL | 3 refills | Status: DC

## 2018-04-23 NOTE — Telephone Encounter (Signed)
Pt is wanting his   losartan (COZAAR) 50 MG tablet [[id number]]   amLODipine (NORVASC) 5 MG tablet [[id number]]   sent [facility] mail order pharmacy

## 2018-04-23 NOTE — Telephone Encounter (Signed)
Done

## 2018-04-30 ENCOUNTER — Other Ambulatory Visit: Payer: Self-pay | Admitting: *Deleted

## 2018-04-30 MED ORDER — PROPRANOLOL HCL 10 MG PO TABS: 10.0000 mg | ORAL_TABLET | Freq: Every day | ORAL | 2 refills | Status: DC

## 2018-04-30 NOTE — Telephone Encounter (Signed)
Ok to refill under Dr. Camnitz 

## 2018-04-30 NOTE — Telephone Encounter (Signed)
Okay to continue to refill under Dr Curt Bears or should it be deferred back to Dr Harrington Challenger? Please advise.

## 2018-05-04 NOTE — Progress Notes (Signed)
Cardiology Office Note    Date:  05/05/2018   ID:  Brent Wilkins, DOB 10-02-1940, MRN [id number]  PCP:  Patient, No Pcp Per  Cardiologist: Dorris Carnes, MD   EP: Dr. Curt Bears  Chief Complaint  Patient presents with  . Follow-up    6 month visit    History of Present Illness:    Brent Wilkins is a 77 y.o. male with past medical history of paroxysmal atrial fibrillation (on Tikosyn), HTN, and HLD who presents to the office today for 27-month follow-up.   He was last examined by myself in 11/2017 and denied any chest pain or dyspnea on exertion at that time. Was examined by Dr. Curt Bears in the interim and was continued on Tikosyn and Propranolol with Amlodipine being further titrated to 10mg  daily due to elevated BP. He called the office in 02/2018 reporting feeling "washed out", therefore Losartan was reduced to 50mg  daily and Amlodipine to 5mg  daily.   In talking with the patient and his wife today, he denies any recent chest pain, dyspnea on exertion, palpitations, orthopnea, PND, or lower extremity edema.  He does report having intermittent episodes of dizziness which typically occurs in the morning hours. Reports blood pressure has been variable when checked during this timeframe as SBP can range from the low 100's to 140's and HR is typically in the 50's to 60's. Symptoms did improve following medication changes in 02/2018 as outlined above. His dizziness is not associated with positional changes and he denies any actual syncopal events. No associated palpitations or dyspnea when this occurs. He reports consuming a large amount of water on a daily basis but his wife elaborates on this and says he only consumes 2-3 bottles throughout the entire day.    Past Medical History:  Diagnosis Date  . Allergy    hay fever  . Arthritis    right ankle  . Cataract    surgery 2016  . Chronic kidney disease    stones many years ago  . Dizziness 12/13/2015  . Dysphagia   . Frequent  urination at night   . GERD (gastroesophageal reflux disease)   . History of kidney stones   . HTN (hypertension)   . Hypothyroidism   . 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 Neuropathy    Bilateral ankles  . Paroxysmal atrial fibrillation (HCC)    a. on Tikosyn and Eliquis  . Polyneuropathy 06/16/2017  . Prostate cancer [facility]) 2010   Dr. Rosana Hoes  . Schatzki's ring 10/22/2011  . Seasonal allergies   . Syncope  dx. 8 yrs ago    Past Surgical History:  Procedure Laterality Date  . APPENDECTOMY    . CATARACT EXTRACTION W/PHACO Left 03/27/2015   Procedure: CATARACT EXTRACTION PHACO AND INTRAOCULAR LENS PLACEMENT LEFT EYE CDE=11.73;  Surgeon: Tonny Branch, MD;  Location: [facility];  Service: Ophthalmology;  Laterality: Left;  . CATARACT EXTRACTION W/PHACO Right 03/30/2015   Procedure: CATARACT EXTRACTION PHACO AND INTRAOCULAR LENS PLACEMENT RIGHT EYE CDE=7.35;  Surgeon: Tonny Branch, MD;  Location: [facility];  Service: Ophthalmology;  Laterality: Right;  . CHOLECYSTECTOMY    . COLONOSCOPY  07/23/10   Dr. Vivi Ferns rectum, scattered pancolonic diverticula  . COLONOSCOPY  08/10/1999   internal hemorrhoids,inflammatory polyp  . ESOPHAGOGASTRODUODENOSCOPY  04/12/08   Prominent Schatzki's ring, erosive reflux esophagitis, multiple antral erosions, small hiatal hernia, reactive gastropathy, status post dilation with 30F  . ESOPHAGOGASTRODUODENOSCOPY  10/28/2011   Procedure: ESOPHAGOGASTRODUODENOSCOPY (EGD);  Surgeon:  Daneil Dolin, MD;  Location: [facility];  Service: Endoscopy;  Laterality: N/A;  1:30  . ESOPHAGOGASTRODUODENOSCOPY N/A 09/10/2017   Dr. Gala Romney: Schatkzi's ring s/p dilation at GE junction, mild esophageal reflux esophagitis, medium sized hiatal hernia, normal duodenum. 56 and 79 Fr dilation  . EYE SURGERY    . MALONEY DILATION  10/28/2011   Procedure: Venia Minks DILATION;  Surgeon: Daneil Dolin, MD;  Location: [facility];  Service: Endoscopy;  Laterality: N/A;  . Venia Minks DILATION N/A  09/10/2017   Procedure: Venia Minks DILATION;  Surgeon: Daneil Dolin, MD;  Location: [facility];  Service: Endoscopy;  Laterality: N/A;  . SAVORY DILATION  10/28/2011   Procedure: SAVORY DILATION;  Surgeon: Daneil Dolin, MD;  Location: [facility];  Service: Endoscopy;  Laterality: N/A;  . TONSILLECTOMY    . TOTAL ANKLE ARTHROPLASTY Left 11/25/2013   Procedure: TOTAL ANKLE ARTHOPLASTY;  Surgeon: Wylene Simmer, MD;  Location: [place];  Service: Orthopedics;  Laterality: Left;  . TOTAL ANKLE ARTHROPLASTY Right 07/14/2014   dr hewitt  . TOTAL ANKLE ARTHROPLASTY Right 07/14/2014   Procedure: RIGHT TOTAL ANKLE ARTHOPLASTY;  Surgeon: Wylene Simmer, MD;  Location: [place];  Service: Orthopedics;  Laterality: Right;    Current Medications: Outpatient Medications Prior to Visit  Medication Sig Dispense Refill  . amLODipine (NORVASC) 5 MG tablet Take 1 tablet (5 mg total) by mouth daily. 90 tablet 3  . celecoxib (CELEBREX) 200 MG capsule Take 200 mg by mouth daily as needed for mild pain.     Marland Kitchen dofetilide (TIKOSYN) 500 MCG capsule Take 1 capsule (500 mcg total) by mouth 2 (two) times daily. 180 capsule 1  . ELIQUIS 5 MG TABS tablet TAKE 1 TABLET BY MOUTH TWO  TIMES DAILY 180 tablet 3  . finasteride (PROSCAR) 5 MG tablet Take 5 mg by mouth daily.    . fluticasone (FLONASE) 50 MCG/ACT nasal spray Place 1 spray into both nostrils daily as needed for allergies. 48 g 3  . gabapentin (NEURONTIN) 300 MG capsule Take 300 mg by mouth 2 (two) times daily.     Marland Kitchen levothyroxine (SYNTHROID, LEVOTHROID) 75 MCG tablet Take 1 tablet (75 mcg total) by mouth daily. 90 tablet 3  . loratadine (CLARITIN) 10 MG tablet Take 10 mg by mouth daily as needed for allergies.    Marland Kitchen losartan (COZAAR) 50 MG tablet Take 1 tablet (50 mg total) by mouth daily. 90 tablet 3  . Melatonin 10 MG TABS Take 10 mg by mouth at bedtime.    . Omega-3 Fatty Acids (RA FISH OIL) 1400 MG CPDR Take 1,400 mg by mouth daily.    . pantoprazole (PROTONIX) 40  MG tablet Take 1 tablet (40 mg total) by mouth daily. 90 tablet 3  . Polyethyl Glycol-Propyl Glycol (SYSTANE) 0.4-0.3 % SOLN Place 1 drop into both eyes 2 (two) times daily as needed (dry eyes).     . propranolol (INDERAL) 10 MG tablet Take 1 tablet (10 mg total) by mouth daily. 90 tablet 2  . Tamsulosin HCl (FLOMAX) 0.4 MG CAPS Take 0.4 mg by mouth daily after breakfast.     . Turmeric 500 MG CAPS Take 500 mg by mouth daily.    . vitamin B-12 (CYANOCOBALAMIN) 500 MCG tablet Take 1,000 mcg by mouth daily.      No facility-administered medications prior to visit.      Allergies:   Tetanus toxoids; Statins; Doxycycline; Oxycodone; Penicillins; Sulfa antibiotics; and Xarelto [rivaroxaban]  Social History   Socioeconomic History  . Marital status: Married    Spouse name: Candace  . Number of children: 2  . Years of education: Not on file  . Highest education level: Bachelor's degree (e.g., BA, AB, BS)  Occupational History  . Occupation: retired; Ambulance person: RETIRED  Social Needs  . Financial resource strain: Not hard at all  . Food insecurity:    Worry: Never true    Inability: Never true  . Transportation needs:    Medical: No    Non-medical: No  Tobacco Use  . Smoking status: Never Smoker  . Smokeless tobacco: Never Used  Substance and Sexual Activity  . Alcohol use: Yes    Alcohol/week: 1.0 - 2.0 standard drinks    Types: 1 - 2 Cans of beer per week    Comment: 2 beers a week  . Drug use: No  . Sexual activity: Yes    Partners: Female  Lifestyle  . Physical activity:    Days per week: 5 days    Minutes per session: 30 min  . Stress: Not at all  Relationships  . Social connections:    Talks on phone: More than three times a week    Gets together: More than three times a week    Attends religious service: More than 4 times per year    Active member of club or organization: No    Attends meetings of clubs or organizations: Never    Relationship  status: Married  Other Topics Concern  . Not on file  Social History Narrative   Lives with wife Candace.          Moved from [place]. Lived here for 20 years.    Retired from [employer].   Plays golf.   Works with [employer] and [employer] for [employer].   Wears seatbelt.   Drives.   Eats all food groups.   Never smoked.   Married for over 50 years.    Has two grown children, live in [place] and [place].     Family History:  The patient's family history includes ALS in his son; Arthritis in his mother; Cancer in his brother and father; Cancer (age of onset: 54) in his son; Heart disease in his mother; Hypertension in his father; Prostate cancer in his brother and father; Prostate cancer (age of onset: 55) in his son.   Review of Systems:   Please see the history of present illness.     General:  No chills, fever, night sweats or weight changes. Positive for episodic dizziness.  Cardiovascular:  No chest pain, dyspnea on exertion, edema, orthopnea, palpitations, paroxysmal nocturnal dyspnea. Dermatological: No rash, lesions/masses Respiratory: No cough, dyspnea Urologic: No hematuria, dysuria Abdominal:   No nausea, vomiting, diarrhea, bright red blood per rectum, melena, or hematemesis Neurologic:  No visual changes, wkns, changes in mental status. All other systems reviewed and are otherwise negative except as noted above.   Physical Exam:    VS:  BP 132/64   Pulse 61   Ht 5\' 10"  (1.778 m)   Wt 202 lb (91.6 kg)   SpO2 98%   BMI 28.98 kg/m    General: Well developed, well nourished Caucasian male appearing in no acute distress. Head: Normocephalic, atraumatic, sclera non-icteric, no xanthomas, nares are without discharge.  Neck: No carotid bruits. JVD not elevated.  Lungs: Respirations regular and unlabored, without wheezes or rales.  Heart: Regular rate and rhythm. No  S3 or S4.  No murmur, no rubs, or gallops appreciated. Abdomen: Soft, non-tender, non-distended with normoactive  bowel sounds. No hepatomegaly. No rebound/guarding. No obvious abdominal masses. Msk:  Strength and tone appear normal for age. No joint deformities or effusions. Extremities: No clubbing or cyanosis. No edema.  Distal pedal pulses are 2+ bilaterally. Neuro: Alert and oriented X 3. Moves all extremities spontaneously. No focal deficits noted. Psych:  Responds to questions appropriately with a normal affect. Skin: No rashes or lesions noted  Wt Readings from Last 3 Encounters:  05/05/18 202 lb (91.6 kg)  04/07/18 202 lb (91.6 kg)  03/27/18 200 lb 12.8 oz (91.1 kg)     Studies/Labs Reviewed:   EKG:  EKG is not ordered today.    Recent Labs: 12/16/2017: Magnesium 2.3 04/02/2018: ALT 9; Hemoglobin 15.3; Platelets 239; TSH 3.50 04/07/2018: BUN 19; Creat 1.30; Potassium 4.9; Sodium 143   Lipid Panel    Component Value Date/Time   CHOL 179 04/02/2018 0924   TRIG 181 (H) 04/02/2018 0924   HDL 45 04/02/2018 0924   CHOLHDL 4.0 04/02/2018 0924   VLDL 30 09/22/2017 1444   LDLCALC 104 (H) 04/02/2018 0924    Additional studies/ records that were reviewed today include:   Echocardiogram: 03/2017 Study Conclusions  - Left ventricle: The cavity size was normal. Wall thickness was   increased in a pattern of moderate LVH. Systolic function was   normal. The estimated ejection fraction was in the range of 60%   to 65%. Wall motion was normal; there were no regional wall   motion abnormalities. Doppler parameters are consistent with   restrictive physiology, indicative of decreased left ventricular   diastolic compliance and/or increased left atrial pressure.   Doppler parameters are consistent with high ventricular filling   pressure. - Aortic valve: Moderately calcified annulus. Trileaflet. - Mitral valve: Mildly calcified annulus. - Left atrium: The atrium was severely dilated. - Right ventricle: Systolic function was mildly reduced. - Right atrium: The atrium was mildly to moderately  dilated. - Tricuspid valve: There was mild-moderate regurgitation. - Pulmonary arteries: Incomplete TR jet to accurately estimated   PASP.   Assessment:    1. Persistent atrial fibrillation   2. Current use of long term anticoagulation   3. Essential hypertension   4. Dizziness   5. Hypercholesterolemia      Plan:   In order of problems listed above:  1. Persistent Atrial Fibrillation/ Use of Long-term Anticoagulation - He denies any recent palpitations and is maintaining normal sinus rhythm by examination today.  He remains on Tikosyn 500 mcg BID and Propranolol 10mg  daily.  - Denies any evidence of active bleeding. Continue Eliquis 5 mg twice daily for anticoagulation.  2. HTN - BP is well controlled at 132/64 during today's visit. Continue Amlodipine 5 mg daily, Losartan 50 mg daily, and Propanolol 10 mg daily. May need to consider dose adjustments as outlined below.  3. Dizziness - He reports episodic dizziness in the morning hours which is not associated with positional changes. He does report symptoms start after he takes his morning medications. He is taking Amlodipine and Propanolol in the morning hours while taking Losartan in the evening. I recommended that he try taking Propanolol in the evening. He will report back on his symptoms in 2 weeks. Pending heart rate and blood pressure response, may need to consider decreasing the dose of Propranolol.   4. HLD - Followed by PCP. FLP in 03/2018 showed total cholesterol 179, HDL  45, triglycerides 181, and LDL 104. He has been intolerant to multiple statins in the past and this is currently diet controlled.   Medication Adjustments/Labs and Tests Ordered: Current medicines are reviewed at length with the patient today.  Concerns regarding medicines are outlined above.  Medication changes, Labs and Tests ordered today are listed in the Patient Instructions below. Patient Instructions  Medication Instructions:  Your physician  recommends that you continue on your current medications as directed. Please refer to the Current Medication list given to you today.  Take Propranolol at night  Labwork: NONE   Testing/Procedures: NONE   Follow-Up: Your physician wants you to follow-up in: 6 Months with Dr. Lovena Le. You will receive a reminder letter in the mail two months in advance. If you don't receive a letter, please call our office to schedule the follow-up appointment.  Any Other Special Instructions Will Be Listed Below (If Applicable). Monitor symptoms and Blood Pressures for 2 weeks.    If you need a refill on your cardiac medications before your next appointment, please call your pharmacy. Thank you for choosing [facility]!    Signed, Erma Heritage, PA-C  05/05/2018 4:40 PM     [facility] [address] [place], [place] [address] Phone: [phone]

## 2018-05-05 ENCOUNTER — Ambulatory Visit: Payer: Medicare Other | Admitting: Student

## 2018-05-05 ENCOUNTER — Encounter: Payer: Self-pay | Admitting: Student

## 2018-05-05 VITALS — BP 132/64 | HR 61 | Ht 70.0 in | Wt 202.0 lb

## 2018-05-05 DIAGNOSIS — I10 Essential (primary) hypertension: Secondary | ICD-10-CM

## 2018-05-05 DIAGNOSIS — R42 Dizziness and giddiness: Secondary | ICD-10-CM | POA: Diagnosis not present

## 2018-05-05 DIAGNOSIS — I4819 Other persistent atrial fibrillation: Secondary | ICD-10-CM

## 2018-05-05 DIAGNOSIS — Z7901 Long term (current) use of anticoagulants: Secondary | ICD-10-CM

## 2018-05-05 DIAGNOSIS — E7800 Pure hypercholesterolemia, unspecified: Secondary | ICD-10-CM

## 2018-05-05 NOTE — Patient Instructions (Addendum)
Medication Instructions:  Your physician recommends that you continue on your current medications as directed. Please refer to the Current Medication list given to you today.  Take Propranolol at night  Labwork: NONE   Testing/Procedures: NONE   Follow-Up: Your physician wants you to follow-up in: 6 Months with Dr. Lovena Le. You will receive a reminder letter in the mail two months in advance. If you don't receive a letter, please call our office to schedule the follow-up appointment.   Any Other Special Instructions Will Be Listed Below (If Applicable). Monitor symptoms and Blood Pressures for 2 weeks.     If you need a refill on your cardiac medications before your next appointment, please call your pharmacy. Thank you for choosing [facility]!

## 2018-05-08 IMAGING — CT CT MAXILLOFACIAL W/O CM
3 series · 16 of 47 positions shown, 19 images · non-contrast
Comparison: 03/20/2017

CLINICAL DATA: Chronic sinusitis. Headache behind the eyes for 2
months. History of fall with right eye bruising.

EXAM:
CT MAXILLOFACIAL WITHOUT CONTRAST
TECHNIQUE: Multidetector CT imaging of the maxillofacial structures was
performed. Multiplanar CT image reconstructions were also generated.

[Series 2: max soft · axial · 0.39mm/px · z∈[-170,-20]mm · 10 of 87 slices shown, 13 images]
[im 6/87  brain]
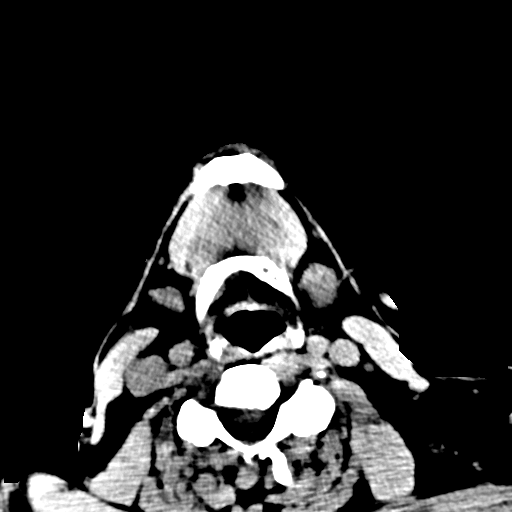
[im 6/87  bone]
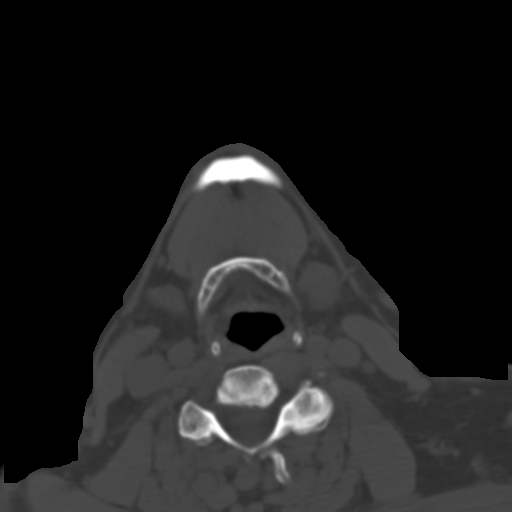
[im 15/87  bone]
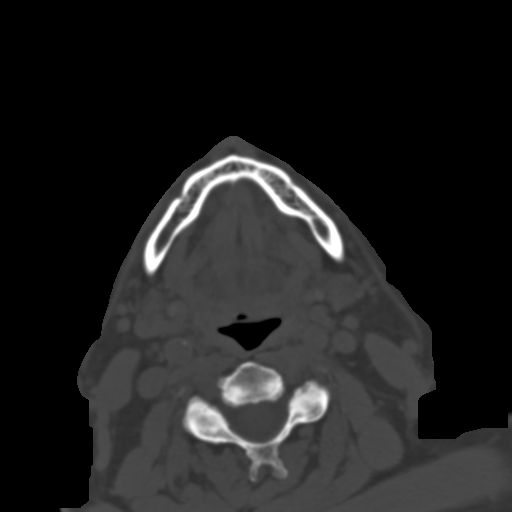
[im 24/87  bone]
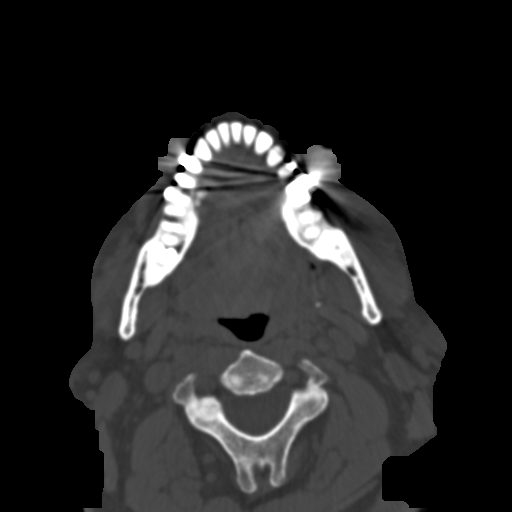
[im 30/87  bone]
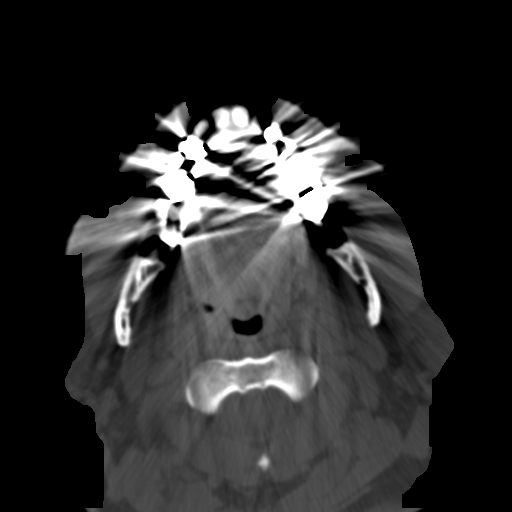
[im 39/87  brain]
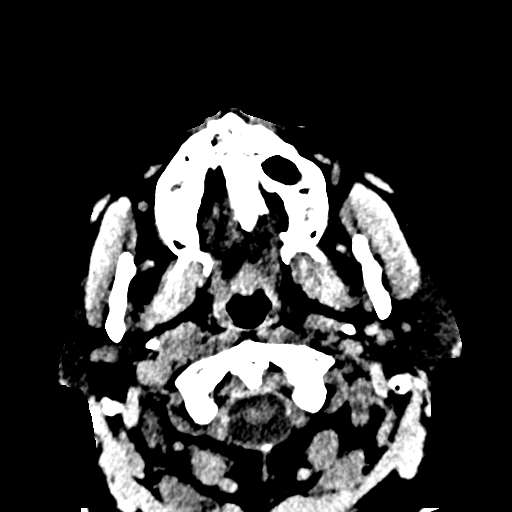
[im 39/87  bone]
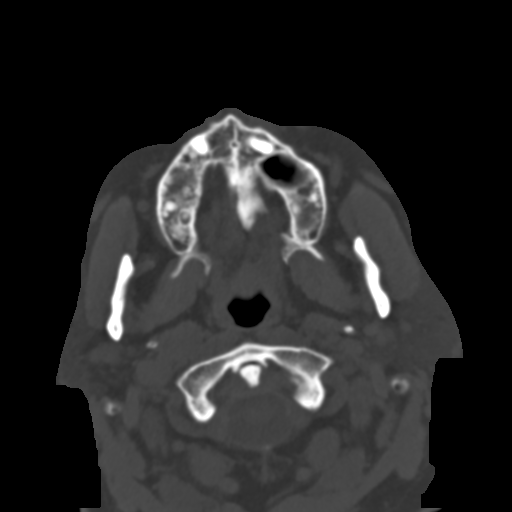
[im 48/87  bone]
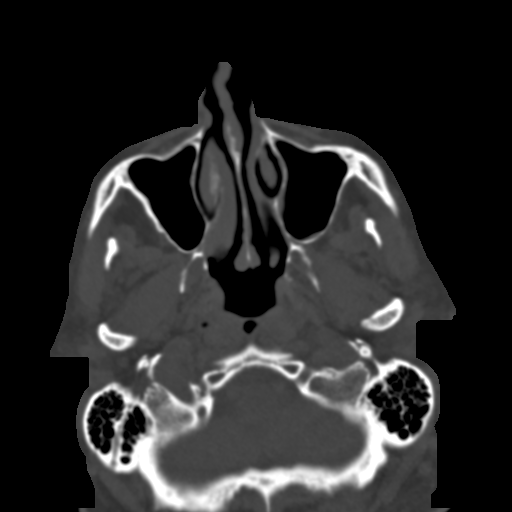
[im 57/87  bone]
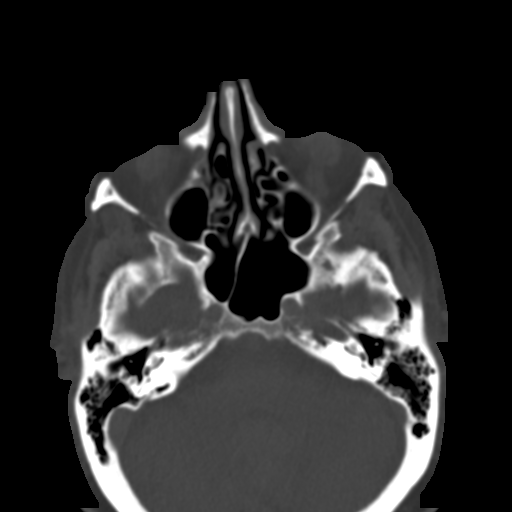
[im 66/87  bone]
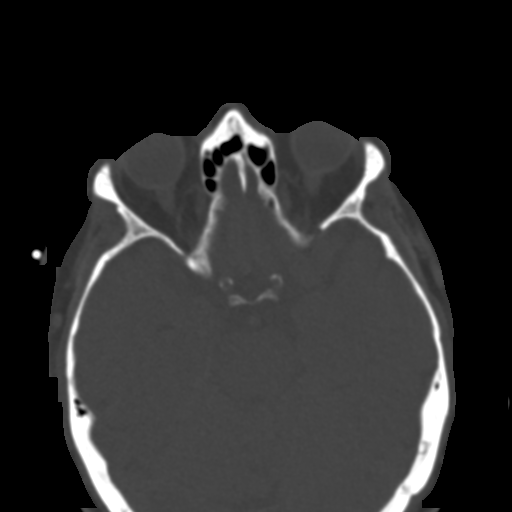
[im 72/87  brain]
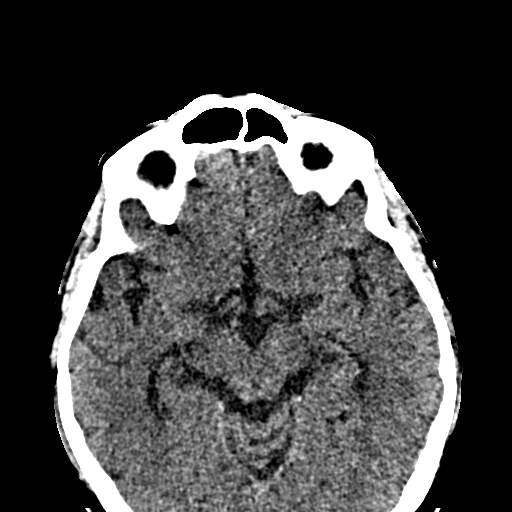
[im 72/87  bone]
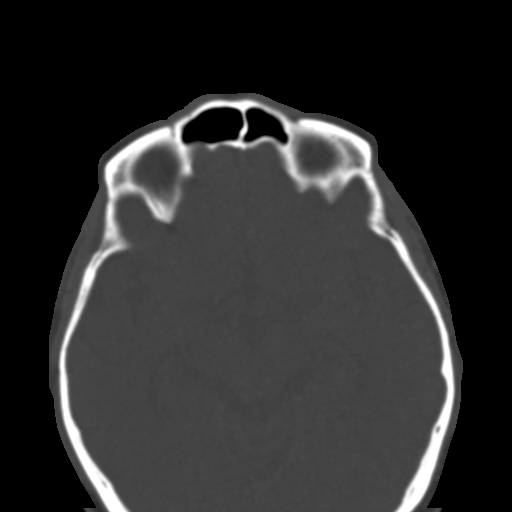
[im 81/87  bone]
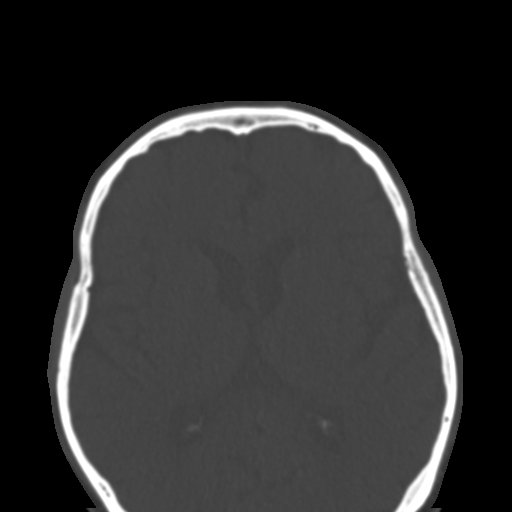

[Series 6: coronal soft · coronal · 0.38mm/px · 3 of 79 slices shown]
[im 27/79  bone]
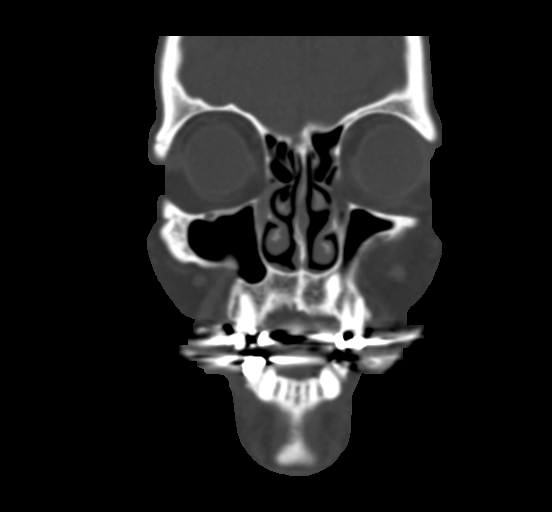
[im 35/79  bone]
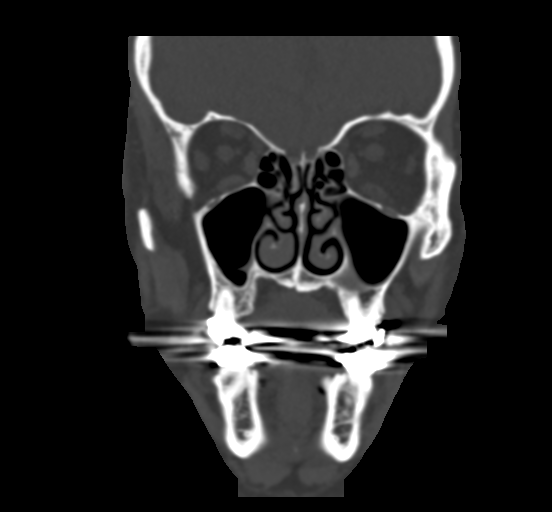
[im 44/79  bone]
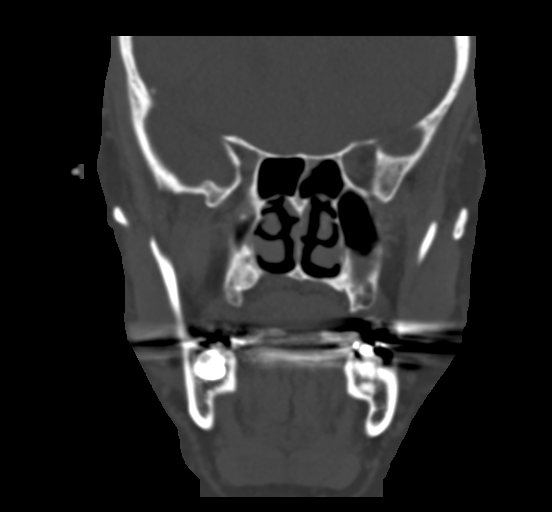

[Series 7: sagittal soft · sagittal · 0.38mm/px · 3 of 107 slices shown]
[im 36/107  bone]
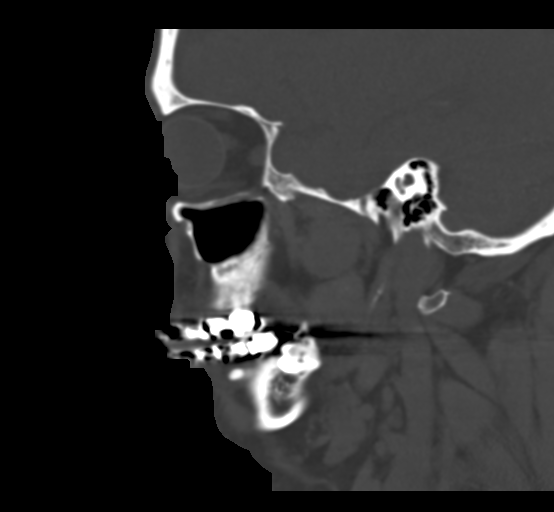
[im 54/107  bone]
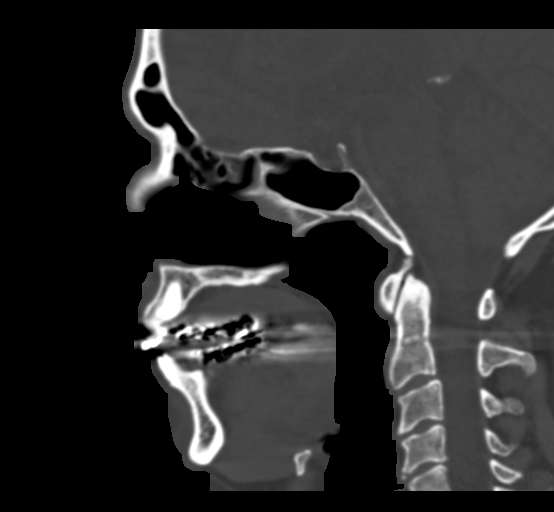
[im 71/107  bone]
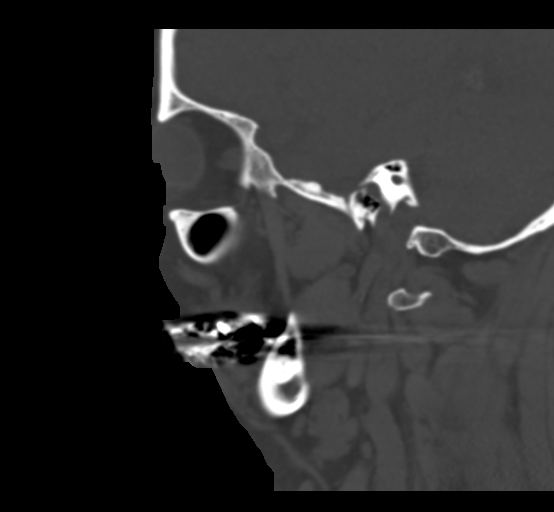

[16 of 47 positions shown; findings below may reference images not displayed]

FINDINGS: Osseous: No fracture or mandibular dislocation. No destructive
process. Chronic bilateral maxillary canine and mandibular wisdom
tooth impaction. Cervical facet spurring.

Orbits: Bilateral cataract resection. Probable mild bilateral
staphyloma. Otherwise negative

Sinuses: Minor mucosal thickening on the floors of the maxillary
sinuses. No fluid levels.

Concha bullosa on the right. There is symmetric patency of the
middle meatus and nasal cavity. No significant nasal septal
deviation.

Soft tissues: Negative.  No evidence of inflammation.

Limited intracranial: Negative
IMPRESSION: 1. Very mild mucosal thickening in the inferior maxillary sinuses.
2. No fluid levels or sinus outflow obstruction.
3. No posttraumatic finding related to history of fall.

## 2018-05-26 ENCOUNTER — Other Ambulatory Visit (HOSPITAL_COMMUNITY): Payer: Self-pay | Admitting: *Deleted

## 2018-05-26 MED ORDER — DOFETILIDE 500 MCG PO CAPS: 500.0000 ug | ORAL_CAPSULE | Freq: Two times a day (BID) | ORAL | 3 refills | Status: DC

## 2018-06-17 ENCOUNTER — Encounter: Payer: Self-pay | Admitting: *Deleted

## 2018-06-23 ENCOUNTER — Encounter: Payer: Self-pay | Admitting: Cardiology

## 2018-06-23 ENCOUNTER — Ambulatory Visit: Payer: Medicare Other | Admitting: Cardiology

## 2018-06-23 VITALS — BP 126/82 | HR 67 | Ht 70.0 in | Wt 202.0 lb

## 2018-06-23 DIAGNOSIS — R55 Syncope and collapse: Secondary | ICD-10-CM | POA: Diagnosis not present

## 2018-06-23 DIAGNOSIS — I4819 Other persistent atrial fibrillation: Secondary | ICD-10-CM | POA: Diagnosis not present

## 2018-06-23 DIAGNOSIS — Z79899 Other long term (current) drug therapy: Secondary | ICD-10-CM

## 2018-06-23 DIAGNOSIS — I10 Essential (primary) hypertension: Secondary | ICD-10-CM | POA: Diagnosis not present

## 2018-06-23 LAB — MAGNESIUM: MAGNESIUM: 2.1 mg/dL (ref 1.6–2.3)

## 2018-06-23 NOTE — Addendum Note (Signed)
Addended by: Stanton Kidney on: 06/23/2018 11:34 AM   Modules accepted: Orders

## 2018-06-23 NOTE — Patient Instructions (Signed)
Medication Instructions:  Your physician has recommended you make the following change in your medication:  1. STOP Propranolol  * If you need a refill on your cardiac medications before your next appointment, please call your pharmacy.   Labwork: Today: Magnesium *We will only notify you of abnormal results, otherwise continue current treatment plan.  Testing/Procedures: None ordered  Follow-Up: Your physician wants you to follow-up in: 6 months with Dr. Curt Bears.  You will receive a reminder letter in the mail two months in advance. If you don't receive a letter, please call our office to schedule the follow-up appointment.  *Please note that any paperwork needing to be filled out by the provider will need to be addressed at the front desk prior to seeing the provider. Please note that any FMLA, disability or other documents regarding health condition is subject to a $25.00 charge that must be received prior to completion of paperwork in the form of a money order or check.  Thank you for choosing [facility]!!   Trinidad Curet, RN [phone]

## 2018-06-23 NOTE — Progress Notes (Signed)
Electrophysiology Office Note   Date:  06/23/2018   ID:  Brent Wilkins, Brent Wilkins [birth year], MRN [id number]  PCP:  Patient, No Pcp Per  Cardiologist:  Harrington Challenger Primary Electrophysiologist:  Kesi Perrow [name], MD    No chief complaint on file.    History of Present Illness: Brent Wilkins is a 77 y.o. male who is being seen today for the evaluation of atrial fibrillation at the request of Caren Macadam, MD. Presenting today for electrophysiology evaluation. He has a history of hypertension, paroxysmal atrial fibrillation, and hyperlipidemia. He was seen in the hospital total at the beginning of April for syncope. The patient was bradycardic and thus not on any AV nodal blockers. At the time of hospitalization, he was found to be orthostatic.  Due to his atrial fibrillation, he was admitted to the hospital for Tikosyn administration.  He tolerated the dose well and was discharged on 500 mcg.  Today, denies symptoms of palpitations, chest pain, shortness of breath, orthopnea, PND, lower extremity edema, claudication, dizziness, presyncope, syncope, bleeding, or neurologic sequela. The patient is tolerating medications without difficulties.  Overall he is doing well.  He has no major complaints.  He does get bradycardic at times which makes his feel weak and fatigued.  He otherwise is doing well without issue.   Past Medical History:  Diagnosis Date  . Allergy    hay fever  . Arthritis    right ankle  . Cataract    surgery 2016  . Chronic kidney disease    stones many years ago  . Dizziness 12/13/2015  . Dysphagia   . Frequent urination at night   . GERD (gastroesophageal reflux disease)   . History of kidney stones   . HTN (hypertension)   . Hypothyroidism   . 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 Neuropathy    Bilateral ankles  . Paroxysmal atrial fibrillation (HCC)    a. on Tikosyn and Eliquis  . Polyneuropathy 06/16/2017  . Prostate cancer [facility]) 2010   Dr. Rosana Hoes  .  Schatzki's ring 10/22/2011  . Seasonal allergies   . Syncope  dx. 8 yrs ago   Past Surgical History:  Procedure Laterality Date  . APPENDECTOMY    . CATARACT EXTRACTION W/PHACO Left 03/27/2015   Procedure: CATARACT EXTRACTION PHACO AND INTRAOCULAR LENS PLACEMENT LEFT EYE CDE=11.73;  Surgeon: Tonny Branch, MD;  Location: [facility];  Service: Ophthalmology;  Laterality: Left;  . CATARACT EXTRACTION W/PHACO Right 03/30/2015   Procedure: CATARACT EXTRACTION PHACO AND INTRAOCULAR LENS PLACEMENT RIGHT EYE CDE=7.35;  Surgeon: Tonny Branch, MD;  Location: [facility];  Service: Ophthalmology;  Laterality: Right;  . CHOLECYSTECTOMY    . COLONOSCOPY  07/23/10   Dr. Vivi Ferns rectum, scattered pancolonic diverticula  . COLONOSCOPY  08/10/1999   internal hemorrhoids,inflammatory polyp  . ESOPHAGOGASTRODUODENOSCOPY  04/12/08   Prominent Schatzki's ring, erosive reflux esophagitis, multiple antral erosions, small hiatal hernia, reactive gastropathy, status post dilation with 60F  . ESOPHAGOGASTRODUODENOSCOPY  10/28/2011   Procedure: ESOPHAGOGASTRODUODENOSCOPY (EGD);  Surgeon: Daneil Dolin, MD;  Location: [facility];  Service: Endoscopy;  Laterality: N/A;  1:30  . ESOPHAGOGASTRODUODENOSCOPY N/A 09/10/2017   Dr. Gala Romney: Schatkzi's ring s/p dilation at GE junction, mild esophageal reflux esophagitis, medium sized hiatal hernia, normal duodenum. 56 and 45 Fr dilation  . EYE SURGERY    . MALONEY DILATION  10/28/2011   Procedure: Venia Minks DILATION;  Surgeon: Daneil Dolin, MD;  Location: [facility];  Service: Endoscopy;  Laterality: N/A;  . Venia Minks DILATION N/A 09/10/2017   Procedure: Venia Minks DILATION;  Surgeon: Daneil Dolin, MD;  Location: [facility];  Service: Endoscopy;  Laterality: N/A;  . SAVORY DILATION  10/28/2011   Procedure: SAVORY DILATION;  Surgeon: Daneil Dolin, MD;  Location: [facility];  Service: Endoscopy;  Laterality: N/A;  . TONSILLECTOMY    . TOTAL ANKLE ARTHROPLASTY Left 11/25/2013    Procedure: TOTAL ANKLE ARTHOPLASTY;  Surgeon: Wylene Simmer, MD;  Location: [place];  Service: Orthopedics;  Laterality: Left;  . TOTAL ANKLE ARTHROPLASTY Right 07/14/2014   dr hewitt  . TOTAL ANKLE ARTHROPLASTY Right 07/14/2014   Procedure: RIGHT TOTAL ANKLE ARTHOPLASTY;  Surgeon: Wylene Simmer, MD;  Location: [place];  Service: Orthopedics;  Laterality: Right;     Current Outpatient Medications  Medication Sig Dispense Refill  . amLODipine (NORVASC) 5 MG tablet Take 1 tablet (5 mg total) by mouth daily. 90 tablet 3  . celecoxib (CELEBREX) 200 MG capsule Take 200 mg by mouth daily as needed for mild pain.     Marland Kitchen dofetilide (TIKOSYN) 500 MCG capsule Take 1 capsule (500 mcg total) by mouth 2 (two) times daily. 180 capsule 3  . ELIQUIS 5 MG TABS tablet TAKE 1 TABLET BY MOUTH TWO  TIMES DAILY 180 tablet 3  . finasteride (PROSCAR) 5 MG tablet Take 5 mg by mouth daily.    . fluticasone (FLONASE) 50 MCG/ACT nasal spray Place 1 spray into both nostrils daily as needed for allergies. 48 g 3  . gabapentin (NEURONTIN) 300 MG capsule Take 300 mg by mouth 2 (two) times daily.     Marland Kitchen levothyroxine (SYNTHROID, LEVOTHROID) 75 MCG tablet Take 1 tablet (75 mcg total) by mouth daily. 90 tablet 3  . loratadine (CLARITIN) 10 MG tablet Take 10 mg by mouth daily as needed for allergies.    Marland Kitchen losartan (COZAAR) 50 MG tablet Take 1 tablet (50 mg total) by mouth daily. 90 tablet 3  . Melatonin 10 MG TABS Take 10 mg by mouth at bedtime.    . Omega-3 Fatty Acids (RA FISH OIL) 1400 MG CPDR Take 1,400 mg by mouth daily.    . pantoprazole (PROTONIX) 40 MG tablet Take 1 tablet (40 mg total) by mouth daily. 90 tablet 3  . Polyethyl Glycol-Propyl Glycol (SYSTANE) 0.4-0.3 % SOLN Place 1 drop into both eyes 2 (two) times daily as needed (dry eyes).     . propranolol (INDERAL) 10 MG tablet Take 1 tablet (10 mg total) by mouth daily. 90 tablet 2  . Tamsulosin HCl (FLOMAX) 0.4 MG CAPS Take 0.4 mg by mouth daily after breakfast.     .  Turmeric 500 MG CAPS Take 500 mg by mouth daily.    . vitamin B-12 (CYANOCOBALAMIN) 500 MCG tablet Take 1,000 mcg by mouth daily.      No current facility-administered medications for this visit.     Allergies:   Tetanus toxoids; Statins; Sulfamethoxazole; Doxycycline; Oxycodone; Penicillins; Sulfa antibiotics; and Xarelto [rivaroxaban]   Social History:  The patient  reports that he has never smoked. He has never used smokeless tobacco. He reports that he drinks about 1.0 - 2.0 standard drinks of alcohol per week. He reports that he does not use drugs.   Family History:  The patient's family history includes ALS in his son; Arthritis in his mother; Cancer in his father; Heart disease in his mother; Hypertension in his father; Prostate cancer in his brother and father; Prostate cancer (age  of onset: 34) in his son.   ROS:  Please see the history of present illness.   Otherwise, review of systems is positive for hearing loss, dizziness, easy bruising, headaches, balance problems.   All other systems are reviewed and negative.   PHYSICAL EXAM: VS:  BP 126/82   Pulse 67   Ht 5\' 10"  (1.778 m)   Wt 202 lb (91.6 kg)   BMI 28.98 kg/m  , BMI Body mass index is 28.98 kg/m. GEN: Well nourished, well developed, in no acute distress  HEENT: normal  Neck: no JVD, carotid bruits, or masses Cardiac: RRR; no murmurs, rubs, or gallops,no edema  Respiratory:  clear to auscultation bilaterally, normal work of breathing GI: soft, nontender, nondistended, + BS MS: no deformity or atrophy  Skin: warm and dry Neuro:  Strength and sensation are intact Psych: euthymic mood, full affect  EKG:  EKG is ordered today. Personal review of the ekg ordered shows sinus rhythm, right bundle branch block, QTC 467 ms   Recent Labs: 12/16/2017: Magnesium 2.3 04/02/2018: ALT 9; Hemoglobin 15.3; Platelets 239; TSH 3.50 04/07/2018: BUN 19; Creat 1.30; Potassium 4.9; Sodium 143    Lipid Panel     Component Value  Date/Time   CHOL 179 04/02/2018 0924   TRIG 181 (H) 04/02/2018 0924   HDL 45 04/02/2018 0924   CHOLHDL 4.0 04/02/2018 0924   VLDL 30 09/22/2017 1444   LDLCALC 104 (H) 04/02/2018 0924     Wt Readings from Last 3 Encounters:  06/23/18 202 lb (91.6 kg)  05/05/18 202 lb (91.6 kg)  04/07/18 202 lb (91.6 kg)      Other studies Reviewed: Additional studies/ records that were reviewed today include: 30 day monitor 04/28/17 - personally reviewed  Review of the above records today demonstrates:   Atrial fibrillation and flutter seen throughout study.  Multiple pauses up to 3 seconds with most occurring during early morning hours (presumably asleep) and one occurring at approximately 1 pm.  TTE 03/21/17 - Left ventricle: The cavity size was normal. Wall thickness was   increased in a pattern of moderate LVH. Systolic function was   normal. The estimated ejection fraction was in the range of 60%   to 65%. Wall motion was normal; there were no regional wall   motion abnormalities. Doppler parameters are consistent with   restrictive physiology, indicative of decreased left ventricular   diastolic compliance and/or increased left atrial pressure.   Doppler parameters are consistent with high ventricular filling   pressure. - Aortic valve: Moderately calcified annulus. Trileaflet. - Mitral valve: Mildly calcified annulus. - Left atrium: The atrium was severely dilated. - Right ventricle: Systolic function was mildly reduced. - Right atrium: The atrium was mildly to moderately dilated. - Tricuspid valve: There was mild-moderate regurgitation. - Pulmonary arteries: Incomplete TR jet to accurately estimated   PASP.  ASSESSMENT AND PLAN:  1.  Atrial fibrillation/atrial flutter: Currently on Eliquis and dofetilide.  He is tolerating these medications without issue.  No further episodes of syncope.  Remains in sinus rhythm.  He has had some bradycardia and thus propranolol   This patients  CHA2DS2-VASc Score and unadjusted Ischemic Stroke Rate (% per year) is equal to 3.2 % stroke rate/year from a score of 3  Above score calculated as 1 point each if present [CHF, HTN, DM, Vascular=MI/PAD/Aortic Plaque, Age if 65-74, or Male] Above score calculated as 2 points each if present [Age > 75, or Stroke/TIA/TE]  2. Syncope: No further episodes.  No changes.   3. Hypertension: Well-controlled today.  No changes.   Current medicines are reviewed at length with the patient today.   The patient does not have concerns regarding his medicines.  The following changes were made today: stop propranolol  Labs/ tests ordered today include:  Orders Placed This Encounter  Procedures  . EKG 12-Lead     Disposition:   FU with [facility] Ketchem 6 months  Signed, Lizzeth Meder [name], MD  06/23/2018 11:31 AM     [facility] [address] [place] [address] [phone] (office) [phone] (fax)

## 2018-06-24 ENCOUNTER — Other Ambulatory Visit: Payer: Self-pay | Admitting: Internal Medicine

## 2018-06-24 NOTE — Telephone Encounter (Signed)
Pt is a 77 yr male who saw Dr Curt Bears on 06/23/18. Weight yesterday was 91.6Kg. SCr on 04/07/18 was 1.3, will refill Eliquis 5mg  BID.

## 2018-07-13 ENCOUNTER — Telehealth: Payer: Self-pay | Admitting: Cardiology

## 2018-07-13 NOTE — Telephone Encounter (Signed)
° ° ° °*  STAT* If patient is at the pharmacy, call can be transferred to refill team.   1. Which medications need to be refilled? (please list name of each medication and dose if known) dofetilide (TIKOSYN) 500 MCG capsule  2. Which pharmacy/location (including street and city if local pharmacy) is medication to be sent to? [facility] fax [phone]  3. Do they need a 30 day or 90 day supply?

## 2018-07-15 ENCOUNTER — Other Ambulatory Visit: Payer: Self-pay | Admitting: *Deleted

## 2018-07-15 MED ORDER — DOFETILIDE 500 MCG PO CAPS: 500.0000 ug | ORAL_CAPSULE | Freq: Two times a day (BID) | ORAL | 3 refills | Status: DC

## 2018-07-15 NOTE — Telephone Encounter (Signed)
This has never been ordered under Dr Curt Bears. Okay to fill or should it be deferred to primary cardiologist? Please advise. Thanks, MI

## 2018-07-15 NOTE — Telephone Encounter (Signed)
Ok to refill under [employer]

## 2018-09-16 ENCOUNTER — Other Ambulatory Visit: Payer: Self-pay | Admitting: Nurse Practitioner

## 2018-09-16 DIAGNOSIS — K2100 Gastro-esophageal reflux disease with esophagitis, without bleeding: Secondary | ICD-10-CM

## 2018-09-16 DIAGNOSIS — R1310 Dysphagia, unspecified: Secondary | ICD-10-CM

## 2018-09-22 ENCOUNTER — Encounter: Payer: Self-pay | Admitting: Family Medicine

## 2018-09-22 ENCOUNTER — Ambulatory Visit (INDEPENDENT_AMBULATORY_CARE_PROVIDER_SITE_OTHER): Payer: Medicare Other | Admitting: Family Medicine

## 2018-09-22 VITALS — BP 156/79 | HR 61 | Resp 12 | Ht 70.0 in | Wt 204.0 lb

## 2018-09-22 DIAGNOSIS — E785 Hyperlipidemia, unspecified: Secondary | ICD-10-CM

## 2018-09-22 DIAGNOSIS — I10 Essential (primary) hypertension: Secondary | ICD-10-CM

## 2018-09-22 DIAGNOSIS — Z23 Encounter for immunization: Secondary | ICD-10-CM

## 2018-09-22 DIAGNOSIS — N182 Chronic kidney disease, stage 2 (mild): Secondary | ICD-10-CM

## 2018-09-22 DIAGNOSIS — I4819 Other persistent atrial fibrillation: Secondary | ICD-10-CM

## 2018-09-22 DIAGNOSIS — E663 Overweight: Secondary | ICD-10-CM | POA: Diagnosis not present

## 2018-09-22 DIAGNOSIS — R739 Hyperglycemia, unspecified: Secondary | ICD-10-CM

## 2018-09-22 DIAGNOSIS — E039 Hypothyroidism, unspecified: Secondary | ICD-10-CM

## 2018-09-22 MED ORDER — LEVOTHYROXINE SODIUM 75 MCG PO TABS: 75.0000 ug | ORAL_TABLET | Freq: Every day | ORAL | 3 refills | Status: DC

## 2018-09-22 MED ORDER — FLUTICASONE PROPIONATE 50 MCG/ACT NA SUSP: 1.0000 | Freq: Every day | NASAL | 3 refills | Status: DC | PRN

## 2018-09-22 NOTE — Progress Notes (Signed)
New Patient Office Visit  Subjective:  Patient ID: Brent Wilkins, male    DOB: [birth year]  Age: 78 y.o. MRN: [id number]  CC:  Chief Complaint  Patient presents with  . establish care  . pain in right ankle -had total ankle replacement    HPI Brent Wilkins is a 78 year old male patient.  Previously known to this practice.  Was last seen by Dr. Mannie Stabile in October of last year.  Has a pertinent history of atrial fibrillation, is maintained on Tikosyn.  Is followed by cardiology. He is on Eliquis for blood thinner.  He does have easily easy bruising but denies any extra bleeding in stool and/or urine.  No nosebleeds reported.  Other pertinent history includes dysphasia, (this is secondary to esophageal strictures, has been stretched and dilated 3 times).  Also has history of GERD, is maintained on Protonix for this.  History of hypothyroidism, maintained on Synthroid for this.  History of benign essential tremor secondary per his report from having meningitis as a child.  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  Is currently on Neurontin for neuropathy.  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  Reports having kidney stones.  Prostate cancer, heavy family history of prostate cancer.  Is followed by Dr. Rosana Hoes for prostate is on Proscar and Flomax at this time.  Is taking a watchful waiting, as he had a low Gleason score.  Health prevention: Colonoscopy at 47 was clean does not feel he will probably repeat this.  Up-to-date on all vaccinations, except for the second dose of Shingrix.  Which she would like to receive today in office.  Reports that he sees a dentist regularly.  Reports that he goes to the eye doctor twice yearly.  Wears glasses.  Reports having problems with his blood pressure.  Blood pressure going up and down very variable currently.  Cardiology is currently trying to tweak his  medications.  Currently maintained on Norvasc and Cozaar.  Reports that he takes his blood pressure at home.  His range is usually the 130s over the 60s.  Does dip down into the 120s at time.  Blood pressure today in the office is 156/79.  Socially, lives with wife Freida Busman, they have been married for 46 years, this June.  Moved down here from [place].  Have 2 children a son and a daughter.  Daughter is doing well.  Son was recently diagnosed with ALS, this causes him stress and worry.  He does not live here in the state.  But rather lives in [place], so they are unable to offer much assistance.  Also in the home is a golden doodle named Doyle.  They use him for exercise walking him a mile to 2 miles a day. Reports being active in the [employer].  Reports wearing a seatbelt as driving.  Reports wearing sunscreen when needed.  Reports eating a well-balanced diet.  Reports never being a smoker.  Reports occasional drinking mostly beer or wine.   In the office Mr. Bagnall does not have any complaints or concerns.  Would like refills on his Synthroid and Flonase.  Past Medical History:  Diagnosis Date  . Allergy    hay fever;  seasonal  . Arthritis    right ankle  . Cataract    surgery 2016  . Chronic kidney disease  stones many years ago  . Dizziness 12/13/2015  . Dysphagia   . Frequent urination at night   . GERD (gastroesophageal reflux disease)   . History of kidney stones   . HTN (hypertension)   . Hypothyroidism   . 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 Neuropathy    Bilateral ankles  . Paroxysmal atrial fibrillation (HCC)    a. on Tikosyn and Eliquis  . Polyneuropathy 06/16/2017  . Prostate cancer [facility]) 2010   Dr. Rosana Hoes  . Schatzki's ring 10/22/2011  . Seasonal allergies   . Syncope  dx. 8 yrs ago    Past Surgical History:  Procedure Laterality Date  . APPENDECTOMY    . CATARACT EXTRACTION W/PHACO Left 03/27/2015   Procedure: CATARACT EXTRACTION  PHACO AND INTRAOCULAR LENS PLACEMENT LEFT EYE CDE=11.73;  Surgeon: Tonny Branch, MD;  Location: [facility];  Service: Ophthalmology;  Laterality: Left;  . CATARACT EXTRACTION W/PHACO Right 03/30/2015   Procedure: CATARACT EXTRACTION PHACO AND INTRAOCULAR LENS PLACEMENT RIGHT EYE CDE=7.35;  Surgeon: Tonny Branch, MD;  Location: [facility];  Service: Ophthalmology;  Laterality: Right;  . CHOLECYSTECTOMY    . COLONOSCOPY  07/23/10   Dr. Vivi Ferns rectum, scattered pancolonic diverticula  . COLONOSCOPY  08/10/1999   internal hemorrhoids,inflammatory polyp  . ESOPHAGOGASTRODUODENOSCOPY  04/12/08   Prominent Schatzki's ring, erosive reflux esophagitis, multiple antral erosions, small hiatal hernia, reactive gastropathy, status post dilation with 20F  . ESOPHAGOGASTRODUODENOSCOPY  10/28/2011   Procedure: ESOPHAGOGASTRODUODENOSCOPY (EGD);  Surgeon: Daneil Dolin, MD;  [facility];  Service: Endoscopy;  Laterality: N/A;  1:30  . ESOPHAGOGASTRODUODENOSCOPY N/A 09/10/2017   Dr. Gala Romney: Schatkzi's ring s/p dilation at GE junction, mild esophageal reflux esophagitis, medium sized hiatal hernia, normal duodenum. 56 and 55 Fr dilation  . EYE SURGERY    . MALONEY DILATION  10/28/2011   Procedure: Venia Minks DILATION;  Surgeon: Daneil Dolin, MD;  [facility];  Service: Endoscopy;  Laterality: N/A;  . Venia Minks DILATION N/A 09/10/2017   Procedure: Venia Minks DILATION;  Surgeon: Daneil Dolin, MD;  [facility];  Service: Endoscopy;  Laterality: N/A;  . SAVORY DILATION  10/28/2011   Procedure: SAVORY DILATION;  Surgeon: Daneil Dolin, MD;  [facility];  Service: Endoscopy;  Laterality: N/A;  . TONSILLECTOMY    . TOTAL ANKLE ARTHROPLASTY Left 11/25/2013   Procedure: TOTAL ANKLE ARTHOPLASTY;  Surgeon: Wylene Simmer, MD;  Location: [place];  Service: Orthopedics;  Laterality: Left;  . TOTAL ANKLE ARTHROPLASTY Right 07/14/2014   dr hewitt  . TOTAL ANKLE ARTHROPLASTY Right 07/14/2014    Procedure: RIGHT TOTAL ANKLE ARTHOPLASTY;  Surgeon: Wylene Simmer, MD;  Location: [place];  Service: Orthopedics;  Laterality: Right;    Family History  Problem Relation Age of Onset  . Prostate cancer Father   . Cancer Father        prostate to bone  . Hypertension Father   . Prostate cancer Son 76  . ALS Son   . Prostate cancer Brother   . Heart disease Mother        died at 76  . Arthritis Mother   . Colon cancer Neg Hx     Social History   Socioeconomic History  . Marital status: Married    Spouse name: Candace  . Number of children: 2  . Years of education: Not on file  . Highest education level: Bachelor's degree (e.g., BA, AB, BS)  Occupational History  .  Occupation: retired; Ambulance person: RETIRED  Social Needs  . Financial resource strain: Not hard at all  . Food insecurity:    Worry: Never true    Inability: Never true  . Transportation needs:    Medical: No    Non-medical: No  Tobacco Use  . Smoking status: Never Smoker  . Smokeless tobacco: Never Used  Substance and Sexual Activity  . Alcohol use: Yes    Alcohol/week: 2.0 standard drinks    Types: 1 Glasses of wine, 1 Cans of beer per week    Comment: 4/7 days  . Drug use: No  . Sexual activity: Yes    Partners: Female  Lifestyle  . Physical activity:    Days per week: 5 days    Minutes per session: 30 min  . Stress: Not at all  Relationships  . Social connections:    Talks on phone: More than three times a week    Gets together: More than three times a week    Attends religious service: More than 4 times per year    Active member of club or organization: No    Attends meetings of clubs or organizations: Never    Relationship status: Married  . Intimate partner violence:    Fear of current or ex partner: No    Emotionally abused: No    Physically abused: No    Forced sexual activity: No  Other Topics Concern  . Not on file  Social History Narrative   Lives with wife  Candace.       Moved from [place], [place]   Retired from [employer].   Plays golf.   Works with [employer] and [employer] for [employer].   Wears seatbelt.   Drives.   Eats all food groups.   Never smoked.   Married for over 49 years June  (2020)   Has two grown children, live in [place] and [place].    ROS Review of Systems  Constitutional: Negative for activity change, appetite change, chills, fatigue and fever.  HENT: Positive for sinus pressure, sinus pain, tinnitus and trouble swallowing. Negative for congestion, ear pain, postnasal drip, rhinorrhea and sneezing.        Bilateral ear damage secondary to work (droning sound).   Cold water, dry foods, and big pieces of food can cause trouble.  Eyes: Negative for visual disturbance.  Respiratory: Negative for cough, choking, chest tightness, shortness of breath and wheezing.   Cardiovascular: Negative for chest pain, palpitations and leg swelling.  Gastrointestinal: Negative for blood in stool, constipation, diarrhea, nausea and vomiting.  Endocrine: Negative for cold intolerance, heat intolerance, polydipsia, polyphagia and polyuria.  Genitourinary: Negative for difficulty urinating, hematuria, penile swelling, scrotal swelling and testicular pain.  Musculoskeletal: Positive for joint swelling and myalgias. Negative for gait problem.  Skin: Negative for color change, rash and wound.  Allergic/Immunologic: Positive for environmental allergies and food allergies.  Neurological: Positive for tremors, weakness and headaches. Negative for dizziness, seizures, syncope and light-headedness.       Meningitis caused a secondary tremor worse over the years  Hematological: Negative for adenopathy. Bruises/bleeds easily.       On blood thinner  Psychiatric/Behavioral: Negative for confusion, decreased concentration and sleep disturbance. The patient is not nervous/anxious.   All other systems reviewed and are negative.   Objective:   Today's  Vitals: BP (!) 156/79   Pulse 61   Resp 12   Ht 5\' 10"  (1.778 m)  Wt 204 lb (92.5 kg)   SpO2 100% Comment: room air  BMI 29.27 kg/m   Physical Exam Vitals signs and nursing note reviewed.  Constitutional:      Appearance: Normal appearance. He is obese.  HENT:     Head: Normocephalic.     Right Ear: Tympanic membrane, ear canal and external ear normal. Decreased hearing noted.     Left Ear: Tympanic membrane, ear canal and external ear normal. Decreased hearing noted.     Ears:     Comments: bilateral hearing loss, wearing hearing aids    Nose: Nose normal.     Mouth/Throat:     Mouth: Mucous membranes are moist.     Pharynx: Oropharynx is clear.  Eyes:     Conjunctiva/sclera: Conjunctivae normal.     Comments: Vision corrected with glasses.  Neck:     Musculoskeletal: Normal range of motion and neck supple.  Cardiovascular:     Rate and Rhythm: Normal rate and regular rhythm.     Pulses: Normal pulses.  Pulmonary:     Effort: Pulmonary effort is normal.     Breath sounds: Normal breath sounds.  Abdominal:     General: Abdomen is flat.     Palpations: Abdomen is soft.  Musculoskeletal: Normal range of motion.  Skin:    General: Skin is warm and dry.  Neurological:     Mental Status: He is alert and oriented to person, place, and time.  Psychiatric:        Mood and Affect: Mood normal.        Behavior: Behavior normal.        Thought Content: Thought content normal.        Judgment: Judgment normal.     Assessment & Plan:    1. Other persistent atrial fibrillation Has persistent atrial fib.  Is maintained on Tikosyn.  Is regularly followed by cardiology.  Last visit being in November.  There is been changes to his medication, secondary to having labile blood pressure, reports of dizziness, reports of feeling off balance.    - COMPLETE METABOLIC PANEL WITH GFR; Future - CBC; Future  2. Hypothyroidism, unspecified type Hypothyroid, maintained on Synthroid.   Had his labs drawn in August.   Within range.  Will order his refill at same dose.  - TSH; Future  3. Immunization due Provided Shingrix second dose in office today.  - Varicella-zoster vaccine IM (Shingrix)  4. Essential hypertension Blood pressure 156/79.  Recheck was 140/70.  Reports taking his medication as ordered.  Has been having labile blood pressure.  Will follow cardiology's recommendations and maintenance of this.  We will order CBC CMP and lipid profile for future.  - COMPLETE METABOLIC PANEL WITH GFR; Future - CBC; Future - Lipid Profile; Future  5. CKD (chronic kidney disease) stage 2, GFR 60-89 ml/min CKD stage II, GFR 60.  Last checked in August.  Stable at that time.  Will recheck CmP with GFR.  Prior to annual visit.  - COMPLETE METABOLIC PANEL WITH GFR; Future  6. Hyperlipidemia LDL goal <100 Last lipid profile was drawn in August as well, HDL was 45, LDL was 104.  Close to goal of being under 100.  Is unable to tolerate statins.  Is taking fish oil daily.  Tries to eat a well-balanced diet.  Further educated and encouraged to maintain a well-balanced diet.  Which includes high-fiber, fruit, vegetable, being at least 1/2-3/4 of his meal.  Additionally encouraged to maintain exercise,  reports walking 1 mile daily.  BMI is currently 29.27 would like this to be closer to 24.  Will check lipid profile and hemoglobin A1c prior to visit in August for annual check.  - Lipid Profile; Future - HgB A1c; Future  7. Hyperglycemia Previous history of having hyperglycemia, last A1c was 5.4.  Last August.  We will continue to monitor this by checking this August when coming back for annual visit.  Until then strongly encouraged to maintain exercise and eat a well-balanced diet.   8. Overweight (BMI 25.0-29.9) Currently listed as a BMI of over 29.  Please see above and discussion of how to reduce weight.  We will follow-up in future.  - Lipid Profile; Future - HgB A1c;  Future    Follow-up: Follow-up between August and September, for annual visit, for labs.  Instructed to get labs labs at least 1 week prior to coming to the visit.  [name], NP

## 2018-09-22 NOTE — Patient Instructions (Addendum)
    Thank you for coming into the office today. I appreciate the opportunity to provide you with the care for your health and wellness.  Please follow up with the front desk to get your Annual visit scheduled.  Please get your lab work 1-2 weeks prior to this appt.  I encourage you to continue walking daily and staying mentally and physically active.  Im glad you are active in your health and appreciate you following up with your speciality providers as needed.  Your flonase and levothyroxine were sent to [facility].  If your arm gets sore from shot today, please take ibuprofen or tylenol as needed.  It was a pleasure to see you and I look forward to continuing to work together on your health and well-being. Please do not hesitate to call the office if you need care or have questions about your care.  Have a wonderful day and week.  With Gratitude,  Cherly Beach, DNP, AGNP-BC

## 2018-09-23 ENCOUNTER — Encounter: Payer: Self-pay | Admitting: Family Medicine

## 2018-10-13 DIAGNOSIS — L02821 Furuncle of head [any part, except face]: Secondary | ICD-10-CM | POA: Diagnosis not present

## 2018-10-13 DIAGNOSIS — Z85828 Personal history of other malignant neoplasm of skin: Secondary | ICD-10-CM | POA: Diagnosis not present

## 2018-10-13 DIAGNOSIS — L570 Actinic keratosis: Secondary | ICD-10-CM | POA: Diagnosis not present

## 2018-10-13 DIAGNOSIS — B9689 Other specified bacterial agents as the cause of diseases classified elsewhere: Secondary | ICD-10-CM | POA: Diagnosis not present

## 2018-10-13 DIAGNOSIS — D225 Melanocytic nevi of trunk: Secondary | ICD-10-CM | POA: Diagnosis not present

## 2018-10-13 DIAGNOSIS — Z08 Encounter for follow-up examination after completed treatment for malignant neoplasm: Secondary | ICD-10-CM | POA: Diagnosis not present

## 2018-10-19 ENCOUNTER — Telehealth: Payer: Self-pay | Admitting: Internal Medicine

## 2018-10-19 NOTE — Telephone Encounter (Signed)
Will forward to front desk to r/s

## 2018-10-19 NOTE — Telephone Encounter (Signed)
Called pt     He says he is doing good   BP is good  He has appt with [facility] (call back) for this coming summer  I would recomm cancelling appt with me for this week with corona vius Plan for f/u with [facility] only in future unless other problems develop

## 2018-10-22 ENCOUNTER — Ambulatory Visit: Payer: Medicare Other | Admitting: Internal Medicine

## 2018-12-07 ENCOUNTER — Encounter (HOSPITAL_COMMUNITY): Payer: Self-pay | Admitting: *Deleted

## 2018-12-07 ENCOUNTER — Other Ambulatory Visit: Payer: Self-pay

## 2018-12-07 ENCOUNTER — Emergency Department (HOSPITAL_COMMUNITY)
Admission: EM | Admit: 2018-12-07 | Discharge: 2018-12-07 | Disposition: A | Discharge status: Home / Self Care | Payer: Medicare Other | Attending: Emergency Medicine | Admitting: Emergency Medicine

## 2018-12-07 ENCOUNTER — Emergency Department (HOSPITAL_COMMUNITY): Payer: Medicare Other

## 2018-12-07 DIAGNOSIS — E039 Hypothyroidism, unspecified: Secondary | ICD-10-CM | POA: Insufficient documentation

## 2018-12-07 DIAGNOSIS — R079 Chest pain, unspecified: Secondary | ICD-10-CM

## 2018-12-07 DIAGNOSIS — I4510 Unspecified right bundle-branch block: Secondary | ICD-10-CM | POA: Diagnosis not present

## 2018-12-07 DIAGNOSIS — Z79899 Other long term (current) drug therapy: Secondary | ICD-10-CM | POA: Insufficient documentation

## 2018-12-07 DIAGNOSIS — N182 Chronic kidney disease, stage 2 (mild): Secondary | ICD-10-CM | POA: Insufficient documentation

## 2018-12-07 DIAGNOSIS — I129 Hypertensive chronic kidney disease with stage 1 through stage 4 chronic kidney disease, or unspecified chronic kidney disease: Secondary | ICD-10-CM | POA: Diagnosis not present

## 2018-12-07 DIAGNOSIS — Z7901 Long term (current) use of anticoagulants: Secondary | ICD-10-CM | POA: Diagnosis not present

## 2018-12-07 LAB — CBC WITH DIFFERENTIAL/PLATELET
Abs Immature Granulocytes: 0.04 10*3/uL (ref 0.00–0.07)
Basophils Absolute: 0 10*3/uL (ref 0.0–0.1)
Basophils Relative: 0 %
Eosinophils Absolute: 0.2 10*3/uL (ref 0.0–0.5)
Eosinophils Relative: 2 %
HCT: 48.9 % (ref 39.0–52.0)
Hemoglobin: 16.4 g/dL (ref 13.0–17.0)
Immature Granulocytes: 0 %
Lymphocytes Relative: 21 %
Lymphs Abs: 2.6 10*3/uL (ref 0.7–4.0)
MCH: 30.8 pg (ref 26.0–34.0)
MCHC: 33.5 g/dL (ref 30.0–36.0)
MCV: 91.9 fL (ref 80.0–100.0)
Monocytes Absolute: 0.7 10*3/uL (ref 0.1–1.0)
Monocytes Relative: 6 %
Neutro Abs: 8.8 10*3/uL — ABNORMAL HIGH (ref 1.7–7.7)
Neutrophils Relative %: 71 %
Platelets: 250 10*3/uL (ref 150–400)
RBC: 5.32 MIL/uL (ref 4.22–5.81)
RDW: 13.2 % (ref 11.5–15.5)
WBC: 12.3 10*3/uL — ABNORMAL HIGH (ref 4.0–10.5)
nRBC: 0 % (ref 0.0–0.2)

## 2018-12-07 LAB — BASIC METABOLIC PANEL
Anion gap: 12 (ref 5–15)
BUN: 17 mg/dL (ref 8–23)
CO2: 25 mmol/L (ref 22–32)
Calcium: 9.7 mg/dL (ref 8.9–10.3)
Chloride: 106 mmol/L (ref 98–111)
Creatinine, Ser: 1.32 mg/dL — ABNORMAL HIGH (ref 0.61–1.24)
GFR calc Af Amer: 59 mL/min — ABNORMAL LOW (ref >60)
GFR calc non Af Amer: 51 mL/min — ABNORMAL LOW (ref >60)
Glucose, Bld: 140 mg/dL — ABNORMAL HIGH (ref 70–99)
Potassium: 4.2 mmol/L (ref 3.5–5.1)
Sodium: 143 mmol/L (ref 135–145)

## 2018-12-07 LAB — TROPONIN I
Troponin I: <0.03 ng/mL (ref <0.03)
Troponin I: <0.03 ng/mL (ref <0.03)

## 2018-12-07 MED ORDER — LORAZEPAM 2 MG/ML IJ SOLN
1.0000 mg | Freq: Once | INTRAMUSCULAR | Status: AC
  Administered 2018-12-07: 1 mg via INTRAVENOUS

## 2018-12-07 MED ORDER — DICYCLOMINE HCL 20 MG PO TABS: 20.0000 mg | ORAL_TABLET | Freq: Two times a day (BID) | ORAL | 0 refills | Status: DC | PRN

## 2018-12-07 MED ORDER — ALUM & MAG HYDROXIDE-SIMETH 200-200-20 MG/5ML PO SUSP
30.0000 mL | Freq: Once | ORAL | Status: AC
  Administered 2018-12-07: 30 mL via ORAL
  Filled 2018-12-07: qty 30

## 2018-12-07 MED ORDER — DICYCLOMINE HCL 10 MG PO CAPS
10.0000 mg | ORAL_CAPSULE | Freq: Once | ORAL | Status: AC
  Administered 2018-12-07: 10 mg via ORAL
  Filled 2018-12-07: qty 1

## 2018-12-07 MED ORDER — HYDROMORPHONE HCL 1 MG/ML IJ SOLN
0.5000 mg | Freq: Once | INTRAMUSCULAR | Status: AC
  Administered 2018-12-07: 0.5 mg via INTRAVENOUS
  Filled 2018-12-07: qty 1

## 2018-12-07 MED ORDER — LIDOCAINE VISCOUS HCL 2 % MT SOLN
15.0000 mL | Freq: Once | OROMUCOSAL | Status: AC
  Administered 2018-12-07: 11:00:00 15 mL via ORAL
  Filled 2018-12-07: qty 15

## 2018-12-07 NOTE — ED Provider Notes (Signed)
[facility] EMERGENCY DEPARTMENT Provider Note   CSN: [id number] Arrival date & time: 12/07/18  1007    History   Chief Complaint Chief Complaint  Patient presents with   Chest Pain    HPI Brent Wilkins is a 78 y.o. male.   HPI   78yM with epigastric pain. Onset shortly before arrival. He attributes this to a "hiatal hernia attack." Has had similar symptoms previously. Was at rest when symptoms began. Has had hiccups. No vomiting. No dyspnea. No fever or chills. Hx of afib. Reports compliance with his meds.   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 on Tikosyn and Eliquis   Polyneuropathy 06/16/2017   Prostate cancer (HCC) 2010   Dr. Rosana Hoes   Schatzki's ring 10/22/2011   Seasonal allergies    Syncope  dx. 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   Dysphagia 10/22/2011   GERD (gastroesophageal reflux disease) 10/22/2011    Past Surgical History:  Procedure Laterality Date   APPENDECTOMY     CATARACT EXTRACTION W/PHACO Left 03/27/2015   Procedure: CATARACT  EXTRACTION PHACO AND INTRAOCULAR LENS PLACEMENT LEFT EYE CDE=11.73;  Surgeon: Tonny Branch, MD;  Location: [facility];  Service: Ophthalmology;  Laterality: Left;   CATARACT EXTRACTION W/PHACO Right 03/30/2015   Procedure: CATARACT EXTRACTION PHACO AND INTRAOCULAR LENS PLACEMENT RIGHT EYE CDE=7.35;  Surgeon: Tonny Branch, MD;  Location: [facility];  Service: Ophthalmology;  Laterality: Right;   CHOLECYSTECTOMY     COLONOSCOPY  07/23/10   Dr. Vivi Ferns rectum, scattered pancolonic diverticula   COLONOSCOPY  08/10/1999   internal hemorrhoids,inflammatory polyp   ESOPHAGOGASTRODUODENOSCOPY  04/12/08   Prominent Schatzki's ring, erosive reflux esophagitis, multiple antral erosions, small hiatal hernia, reactive gastropathy, status post dilation with 66F   ESOPHAGOGASTRODUODENOSCOPY  10/28/2011   Procedure: ESOPHAGOGASTRODUODENOSCOPY (EGD);  Surgeon: Daneil Dolin, MD;  Location: [facility];  Service: Endoscopy;  Laterality: N/A;  1:30   ESOPHAGOGASTRODUODENOSCOPY N/A 09/10/2017   Dr. Gala Romney: Schatkzi's ring s/p dilation at GE junction, mild esophageal reflux esophagitis, medium sized hiatal hernia, normal duodenum. 36 and 76 Fr dilation   EYE SURGERY     MALONEY DILATION  10/28/2011   Procedure: MALONEY DILATION;  Surgeon: Daneil Dolin, MD;  Location: [facility];  Service: Endoscopy;  Laterality: N/A;   MALONEY DILATION N/A 09/10/2017   Procedure: Venia Minks DILATION;  Surgeon: Daneil Dolin, MD;  Location: [facility];  Service: Endoscopy;  Laterality: N/A;  SAVORY DILATION  10/28/2011   Procedure: SAVORY DILATION;  Surgeon: Daneil Dolin, MD;  Location: [facility];  Service: Endoscopy;  Laterality: N/A;   TONSILLECTOMY     TOTAL ANKLE ARTHROPLASTY Left 11/25/2013   Procedure: TOTAL ANKLE ARTHOPLASTY;  Surgeon: Wylene Simmer, MD;  Location: [facility];  Service: Orthopedics;  Laterality: Left;   TOTAL ANKLE ARTHROPLASTY Right 07/14/2014   dr hewitt   TOTAL ANKLE ARTHROPLASTY Right 07/14/2014     Procedure: RIGHT TOTAL ANKLE ARTHOPLASTY;  Surgeon: Wylene Simmer, MD;  Location: [facility];  Service: Orthopedics;  Laterality: Right;        Home Medications    Prior to Admission medications   Medication Sig Start Date End Date Taking? Authorizing Provider  amLODipine (NORVASC) 5 MG tablet Take 1 tablet (5 mg total) by mouth daily. 04/23/18 07/22/18  Fay Records, MD  celecoxib (CELEBREX) 200 MG capsule Take 200 mg by mouth daily as needed for mild pain.     [provider]  diphenhydramine-acetaminophen (TYLENOL PM) 25-500 MG TABS tablet Take 1 tablet by mouth at bedtime as needed. Takes for sleep and mild discomfort    [provider]  dofetilide (TIKOSYN) 500 MCG capsule Take 1 capsule (500 mcg total) by mouth 2 (two) times daily. 07/15/18   Camnitz, Will Hassell Done, MD  ELIQUIS 5 MG TABS tablet TAKE 1 TABLET BY MOUTH TWO  TIMES DAILY 06/24/18   Camnitz, Ocie Doyne, MD  finasteride (PROSCAR) 5 MG tablet Take 5 mg by mouth daily.    [provider]  fluticasone (FLONASE) 50 MCG/ACT nasal spray Place 1 spray into both nostrils daily as needed for allergies. 09/22/18   [name], NP  gabapentin (NEURONTIN) 300 MG capsule Take 300 mg by mouth 2 (two) times daily.  04/19/15   [provider]  ibuprofen (ADVIL,MOTRIN) 200 MG tablet Take 200 mg by mouth every 6 (six) hours as needed for headache. 200-400 mg as needed for HA    [provider]  levothyroxine (SYNTHROID, LEVOTHROID) 75 MCG tablet Take 1 tablet (75 mcg total) by mouth daily. 09/22/18   [name], NP  loratadine (CLARITIN) 10 MG tablet Take 10 mg by mouth daily as needed for allergies.    [provider]  losartan (COZAAR) 50 MG tablet Take 1 tablet (50 mg total) by mouth daily. 04/23/18 07/22/18  Fay Records, MD  Omega-3 Fatty Acids (RA FISH OIL) 1400 MG CPDR Take 1,400 mg by mouth daily.    [provider]  pantoprazole (PROTONIX) 40 MG tablet Take 1 tablet (40 mg  total) by mouth daily before breakfast. 09/17/18   Mahala Menghini, PA-C  Polyethyl Glycol-Propyl Glycol (SYSTANE) 0.4-0.3 % SOLN Place 1 drop into both eyes 2 (two) times daily as needed (dry eyes).     [provider]  Tamsulosin HCl (FLOMAX) 0.4 MG CAPS Take 0.4 mg by mouth daily after breakfast.     [provider]  Turmeric 500 MG CAPS Take 500 mg by mouth daily.    [provider]  vitamin B-12 (CYANOCOBALAMIN) 500 MCG tablet Take 1,000 mcg by mouth daily.     [provider]    Family History Family History  Problem Relation Age of Onset   Prostate cancer Father    Cancer Father        prostate to bone   Hypertension Father    Prostate cancer Son 39   ALS Son    Prostate  cancer Brother    Heart disease Mother        died at 44   Arthritis Mother    Colon cancer Neg Hx     Social History Social History   Tobacco Use   Smoking status: Never Smoker   Smokeless tobacco: Never Used  Substance Use Topics   Alcohol use: Yes    Alcohol/week: 2.0 standard drinks    Types: 1 Glasses of wine, 1 Cans of beer per week    Comment: glass of wine or "couple of beers" occasionally    Drug use: No     Allergies   Tetanus toxoids; Statins; Sulfamethoxazole; Doxycycline; Oxycodone; Penicillins; Sulfa antibiotics; and Xarelto [rivaroxaban]   Review of Systems Review of Systems  All systems reviewed and negative, other than as noted in HPI.  Physical Exam Updated Vital Signs BP (!) 178/97    Pulse (!) 42    Temp 98.7 F (37.1 C) (Oral)    Resp 20    Ht 5\' 11"  (1.803 m)    Wt 88.9 kg    SpO2 100%    BMI 27.34 kg/m   Physical Exam Vitals signs and nursing note reviewed.  Constitutional:      General: He is not in acute distress.    Appearance: He is well-developed.  HENT:     Head: Normocephalic and atraumatic.  Eyes:     General:        Right eye: No discharge.        Left eye: No discharge.     Conjunctiva/sclera:  Conjunctivae normal.  Neck:     Musculoskeletal: Neck supple.  Cardiovascular:     Rate and Rhythm: Normal rate and regular rhythm.     Heart sounds: Normal heart sounds. No murmur. No friction rub. No gallop.   Pulmonary:     Effort: Pulmonary effort is normal. No respiratory distress.     Breath sounds: Normal breath sounds.  Abdominal:     General: There is no distension.     Palpations: Abdomen is soft.     Tenderness: There is abdominal tenderness.     Comments: Mild TTP epigastrium w/o rebound or guarding.   Musculoskeletal:        General: No tenderness.  Skin:    General: Skin is warm and dry.  Neurological:     Mental Status: He is alert.  Psychiatric:        Behavior: Behavior normal.        Thought Content: Thought content normal.      ED Treatments / Results  Labs (all labs ordered are listed, but only abnormal results are displayed) Labs Reviewed  CBC WITH DIFFERENTIAL/PLATELET - Abnormal; Notable for the following components:      Result Value   WBC 12.3 (*)    Neutro Abs 8.8 (*)    All other components within normal limits  BASIC METABOLIC PANEL - Abnormal; Notable for the following components:   Glucose, Bld 140 (*)    Creatinine, Ser 1.32 (*)    GFR calc non Af Amer 51 (*)    GFR calc Af Amer 59 (*)    All other components within normal limits  TROPONIN I  TROPONIN I    EKG EKG Interpretation  Date/Time:  Monday Dec 07 2018 10:13:35 EDT Ventricular Rate:  71 PR Interval:    QRS Duration: 147 QT Interval:  466 QTC Calculation: 481 R Axis:   -18 Text Interpretation:  Atrial fibrillation Right  bundle branch block Confirmed by Virgel Manifold [phone]) on 12/07/2018 10:20:23 AM   Radiology Dg Chest 2 View  Result Date: 12/07/2018 CLINICAL DATA:  78 year old male with left lower chest pain for the past 1.5 hours EXAM: CHEST - 2 VIEW COMPARISON:  Prior chest x-ray 03/20/2017 FINDINGS: The lungs are clear and negative for focal airspace consolidation,  pulmonary edema or suspicious pulmonary nodule. Stable linear scarring in the lingula. No pleural effusion or pneumothorax. Cardiac and mediastinal contours are within normal limits. Atherosclerotic vascular calcifications present in the transverse aorta. No acute fracture or lytic or blastic osseous lesions. The visualized upper abdominal bowel gas pattern is unremarkable. Surgical clips in the right upper quadrant suggest prior cholecystectomy. IMPRESSION: No active cardiopulmonary disease. Electronically Signed   By: Jacqulynn Cadet M.D.   On: 12/07/2018 10:37    Procedures Procedures (including critical care time)  Medications Ordered in ED Medications  alum & mag hydroxide-simeth (MAALOX/MYLANTA) 200-200-20 MG/5ML suspension 30 mL (30 mLs Oral Given 12/07/18 1103)    And  lidocaine (XYLOCAINE) 2 % viscous mouth solution 15 mL (15 mLs Oral Given 12/07/18 1103)  HYDROmorphone (DILAUDID) injection 0.5 mg (0.5 mg Intravenous Given 12/07/18 1104)  LORazepam (ATIVAN) injection 1 mg (1 mg Intravenous Given 12/07/18 1104)  dicyclomine (BENTYL) capsule 10 mg (10 mg Oral Given 12/07/18 1442)  HYDROmorphone (DILAUDID) injection 0.5 mg (0.5 mg Intravenous Given 12/07/18 1443)     Initial Impression / Assessment and Plan / ED Course  I have reviewed the triage vital signs and the nursing notes.  Pertinent labs & imaging results that were available during my care of the patient were reviewed by me and considered in my medical decision making (see chart for details).        78yM with epigastric/lower sternal pain. mild epigastric tenderness on exam. I doubt ACS. Atypical. Troponin normal x2. Feels much improved. Some bradycardia noted while in ED but asymptomatic with it. Known hx of afib.   Final Clinical Impressions(s) / ED Diagnoses   Final diagnoses:  Chest pain, unspecified type    ED Discharge Orders    None       Virgel Manifold, MD 12/08/18 [phone]

## 2018-12-07 NOTE — ED Notes (Signed)
Patient transported to X-ray 

## 2018-12-07 NOTE — ED Notes (Signed)
Dr Wilson Singer notified of HR upper 30's-40's at times. Pt reports lessening of pain after meds. Pt alert and comfortable. EKG completed

## 2018-12-07 NOTE — ED Notes (Signed)
Candace Scearce [phone]

## 2018-12-07 NOTE — ED Triage Notes (Signed)
Pt c/o left lower chest pain that started 1.5 hours ago. Pt reports it feels similar to when he has had "hiatal hernia attacks". Pt denies SOB, dizziness, nausea, vomiting.

## 2018-12-09 ENCOUNTER — Ambulatory Visit (INDEPENDENT_AMBULATORY_CARE_PROVIDER_SITE_OTHER): Payer: Medicare Other | Admitting: Family Medicine

## 2018-12-09 ENCOUNTER — Other Ambulatory Visit: Payer: Self-pay

## 2018-12-09 ENCOUNTER — Telehealth: Payer: Self-pay | Admitting: Internal Medicine

## 2018-12-09 ENCOUNTER — Ambulatory Visit (INDEPENDENT_AMBULATORY_CARE_PROVIDER_SITE_OTHER): Payer: Medicare Other | Admitting: Gastroenterology

## 2018-12-09 ENCOUNTER — Encounter: Payer: Self-pay | Admitting: Gastroenterology

## 2018-12-09 ENCOUNTER — Encounter: Payer: Self-pay | Admitting: Family Medicine

## 2018-12-09 VITALS — BP 132/80 | HR 66 | Ht 70.0 in | Wt 197.0 lb

## 2018-12-09 DIAGNOSIS — K5900 Constipation, unspecified: Secondary | ICD-10-CM

## 2018-12-09 HISTORY — DX: Constipation, unspecified: K59.00

## 2018-12-09 NOTE — Progress Notes (Signed)
Virtual Visit via Telephone Note   This visit type was conducted due to national recommendations for restrictions regarding the COVID-19 Pandemic (e.g. social distancing) in an effort to limit this patient's exposure and mitigate transmission in our community.  Due to his co-morbid illnesses, this patient is at least at moderate risk for complications without adequate follow up.  This format is felt to be most appropriate for this patient at this time.  The patient did not have access to video technology/had technical difficulties with video requiring transitioning to audio format only (telephone).  All issues noted in this document were discussed and addressed.  No physical exam could be performed with this format.   Evaluation Performed:  Follow-up visit  Date:  12/09/2018   ID:  Brent, Wilkins [birth year], MRN [id number]  Patient Location: Home Provider Location: Other:  telemedicine  Location of Patient: Home Location of Provider: Telehealth Consent was obtain for visit to be over via telehealth. I verified that I am speaking with the correct person using two identifiers.  PCP:  [name], NP   Chief Complaint:  Constipated, hernia  History of Present Illness:    Brent Wilkins is a 78 y.o. male patient of mine.  Who is known to the clinic.  Who has history of atrial fib, hypertension, GERD, essential tremor, kidney disease stage II, prostate cancer.  On May 4 Brent Wilkins presented to the emergency room with epigastric pain that had onset shortly before his arrival.  He attributed this to a hiatal hernia.  Had similar symptoms previously.  Was at rest when the symptoms began.  Reported he had hiccups.  Denied having vomiting, shortness of breath, fever or chills.  ED exam demonstrated some abdominal tenderness, mild TTP epigastrium without rebound or guarding.  All other physical assessment was normal.  EKG demonstrated ventricular rate of 71 was found to be in atrial  fib.  He was giving several doses of Dilaudid IV, Bentyl capsule, Ativan, Xylocaine mouth solution, Maalox.  He was found to have rule out or possible suspicion of ACS.  Atypical presentation.  Troponin normal x2.  WBCs were slightly elevated at 12.3, with slight increase in neutrophil shift.  He had felt much improved after having the Xylocaine.  There was some bradycardia noted while he was in the emergency room but was asymptomatic.  And has a known history of having A. fib  Today he presents because he is having ongoing constipation.  He says his last bowel movement was Sunday morning prior to the emergency room.  He has been unable to eat anything secondary to having nausea.  Reports some pain in the lower part of stomach and intestinal area. Reports some bloating.  Feels like he just needs to have a bowel movement and he would feel much better.  Nothing seems to aggravate it except for the fact of eating.  Nothing has relieved it.  Reports that he is tried enemas, laxatives-MiraLAX.  Additionally he has a GI appointment at 340 this afternoon.  He denies having any fever, chills, vomiting, excessive gas, diarrhea.  Reports that he has been taking the Bentyl pretty much daily since he received it on Sunday.  As he reports that this helps with his abdominal discomfort.  Wife was concerned that this could be the cause of his constipation.  The patient does not have symptoms concerning for COVID-19 infection (fever, chills, cough, or new shortness of breath).   Past Medical,  Surgical, Social History, Allergies, and Medications have been Reviewed.  Past Medical History:  Diagnosis Date  . Allergy    hay fever;  seasonal  . Arthritis    right ankle  . Cataract    surgery 2016  . Chronic kidney disease    stones many years ago  . Dizziness 12/13/2015  . Dysphagia   . Frequent urination at night   . GERD (gastroesophageal reflux disease)   . History of kidney stones   . HTN (hypertension)   .  Hypothyroidism   . 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 Neuropathy    Bilateral ankles  . Paroxysmal atrial fibrillation (HCC)    a. on Tikosyn and Eliquis  . Polyneuropathy 06/16/2017  . Prostate cancer [facility]) 2010   Dr. Rosana Hoes  . Schatzki's ring 10/22/2011  . Seasonal allergies   . Syncope  dx. 8 yrs ago   Past Surgical History:  Procedure Laterality Date  . APPENDECTOMY    . CATARACT EXTRACTION W/PHACO Left 03/27/2015   Procedure: CATARACT EXTRACTION PHACO AND INTRAOCULAR LENS PLACEMENT LEFT EYE CDE=11.73;  Surgeon: Tonny Branch, MD;  Location: [facility];  Service: Ophthalmology;  Laterality: Left;  . CATARACT EXTRACTION W/PHACO Right 03/30/2015   Procedure: CATARACT EXTRACTION PHACO AND INTRAOCULAR LENS PLACEMENT RIGHT EYE CDE=7.35;  Surgeon: Tonny Branch, MD;  Location: [facility];  Service: Ophthalmology;  Laterality: Right;  . CHOLECYSTECTOMY    . COLONOSCOPY  07/23/10   Dr. Vivi Ferns rectum, scattered pancolonic diverticula  . COLONOSCOPY  08/10/1999   internal hemorrhoids,inflammatory polyp  . ESOPHAGOGASTRODUODENOSCOPY  04/12/08   Prominent Schatzki's ring, erosive reflux esophagitis, multiple antral erosions, small hiatal hernia, reactive gastropathy, status post dilation with 58F  . ESOPHAGOGASTRODUODENOSCOPY  10/28/2011   Procedure: ESOPHAGOGASTRODUODENOSCOPY (EGD);  Surgeon: Daneil Dolin, MD;  [facility];  Service: Endoscopy;  Laterality: N/A;  1:30  . ESOPHAGOGASTRODUODENOSCOPY N/A 09/10/2017   Dr. Gala Romney: Schatkzi's ring s/p dilation at GE junction, mild esophageal reflux esophagitis, medium sized hiatal hernia, normal duodenum. 56 and 59 Fr dilation  . EYE SURGERY    . MALONEY DILATION  10/28/2011   Procedure: Venia Minks DILATION;  Surgeon: Daneil Dolin, MD;  [facility];  Service: Endoscopy;  Laterality: N/A;  . Venia Minks DILATION N/A 09/10/2017   Procedure: Venia Minks DILATION;  Surgeon: Daneil Dolin, MD;  [facility];  Service: Endoscopy;   Laterality: N/A;  . SAVORY DILATION  10/28/2011   Procedure: SAVORY DILATION;  Surgeon: Daneil Dolin, MD;  [facility];  Service: Endoscopy;  Laterality: N/A;  . TONSILLECTOMY    . TOTAL ANKLE ARTHROPLASTY Left 11/25/2013   Procedure: TOTAL ANKLE ARTHOPLASTY;  Surgeon: Wylene Simmer, MD;  Location: [place];  Service: Orthopedics;  Laterality: Left;  . TOTAL ANKLE ARTHROPLASTY Right 07/14/2014   dr hewitt  . TOTAL ANKLE ARTHROPLASTY Right 07/14/2014   Procedure: RIGHT TOTAL ANKLE ARTHOPLASTY;  Surgeon: Wylene Simmer, MD;  Location: [place];  Service: Orthopedics;  Laterality: Right;     Current Meds  Medication Sig  . amLODipine (NORVASC) 5 MG tablet Take 1 tablet (5 mg total) by mouth daily.  . celecoxib (CELEBREX) 200 MG capsule Take 200 mg by mouth daily as needed for mild pain.   Marland Kitchen dicyclomine (BENTYL) 20 MG tablet Take 1 tablet (20 mg total) by mouth every 12 (twelve) hours as needed for spasms.  . diphenhydramine-acetaminophen (TYLENOL PM) 25-500 MG TABS tablet Take 1 tablet by mouth  at bedtime as needed. Takes for sleep and mild discomfort  . dofetilide (TIKOSYN) 500 MCG capsule Take 1 capsule (500 mcg total) by mouth 2 (two) times daily.  Marland Kitchen ELIQUIS 5 MG TABS tablet TAKE 1 TABLET BY MOUTH TWO  TIMES DAILY  . finasteride (PROSCAR) 5 MG tablet Take 5 mg by mouth daily.  . fluticasone (FLONASE) 50 MCG/ACT nasal spray Place 1 spray into both nostrils daily as needed for allergies.  Marland Kitchen gabapentin (NEURONTIN) 300 MG capsule Take 300 mg by mouth 2 (two) times daily.   Marland Kitchen ibuprofen (ADVIL,MOTRIN) 200 MG tablet Take 200 mg by mouth every 6 (six) hours as needed for headache. 200-400 mg as needed for HA  . levothyroxine (SYNTHROID, LEVOTHROID) 75 MCG tablet Take 1 tablet (75 mcg total) by mouth daily.  Marland Kitchen loratadine (CLARITIN) 10 MG tablet Take 10 mg by mouth daily as needed for allergies.  Marland Kitchen losartan (COZAAR) 50 MG tablet Take 1 tablet (50 mg total) by mouth daily.  . Omega-3 Fatty Acids (RA  FISH OIL) 1400 MG CPDR Take 1,400 mg by mouth daily.  . pantoprazole (PROTONIX) 40 MG tablet Take 1 tablet (40 mg total) by mouth daily before breakfast.  . Polyethyl Glycol-Propyl Glycol (SYSTANE) 0.4-0.3 % SOLN Place 1 drop into both eyes 2 (two) times daily as needed (dry eyes).   . Tamsulosin HCl (FLOMAX) 0.4 MG CAPS Take 0.4 mg by mouth daily after breakfast.   . Turmeric 500 MG CAPS Take 500 mg by mouth daily.  . vitamin B-12 (CYANOCOBALAMIN) 500 MCG tablet Take 1,000 mcg by mouth daily.      Allergies:   Tetanus toxoids; Statins; Sulfamethoxazole; Doxycycline; Oxycodone; Penicillins; Sulfa antibiotics; and Xarelto [rivaroxaban]   Social History   Tobacco Use  . Smoking status: Never Smoker  . Smokeless tobacco: Never Used  Substance Use Topics  . Alcohol use: Yes    Alcohol/week: 2.0 standard drinks    Types: 1 Glasses of wine, 1 Cans of beer per week    Comment: glass of wine or "couple of beers" occasionally   . Drug use: No     Family Hx: The patient's family history includes ALS in his son; Arthritis in his mother; Cancer in his father; Heart disease in his mother; Hypertension in his father; Prostate cancer in his brother and father; Prostate cancer (age of onset: 70) in his son. There is no history of Colon cancer.  ROS:   Review of Systems  Constitutional: Negative.   HENT: Negative.   Eyes: Negative.   Respiratory: Negative.   Cardiovascular: Negative.   Gastrointestinal: Positive for abdominal pain, constipation and nausea.  Genitourinary: Negative.   Musculoskeletal: Negative.   Skin: Negative.   Neurological: Negative.   Endo/Heme/Allergies: Negative.   All other systems reviewed and are negative.   Labs/Other Tests and Data Reviewed:    Recent Labs: 04/02/2018: ALT 9; TSH 3.50 06/23/2018: Magnesium 2.1 12/07/2018: BUN 17; Creatinine, Ser 1.32; Hemoglobin 16.4; Platelets 250; Potassium 4.2; Sodium 143   Recent Lipid Panel Lab Results  Component Value  Date/Time   CHOL 179 04/02/2018 09:24 AM   TRIG 181 (H) 04/02/2018 09:24 AM   HDL 45 04/02/2018 09:24 AM   CHOLHDL 4.0 04/02/2018 09:24 AM   LDLCALC 104 (H) 04/02/2018 09:24 AM    Wt Readings from Last 3 Encounters:  12/09/18 197 lb (89.4 kg)  12/07/18 196 lb (88.9 kg)  09/22/18 204 lb (92.5 kg)     Objective:    Vital Signs:  BP 132/80   Pulse 66   Ht 5\' 10"  (1.778 m)   Wt 197 lb (89.4 kg)   BMI 28.27 kg/m    GEN:  alert and oriented RESPIRATORY:  no noted shortness of breath in conversation PSYCH:  normal affect, mood, and memory intact  ASSESSMENT & PLAN:    1. Constipation, unspecified constipation type Mr. Michaux was advised to get magnesium citrate at the pharmacy and try to see if that can help with some motility and movement of constipation concern.  Also advised to get Gas-X or some form of simethicone to help with the bloating sensation he is feeling secondary to having some constipation.    He was prescribed Bentyl at the hospital along with several doses of Dilaudid IV.  Unsure if he has had some drug-induced constipation.  Advised to stop taking Bentyl at this time as that is probably working against him.  There is questionable concern for a small bowel obstruction as he has tried several enemas and MiraLAX without relief.  Additionally he did have a slight bump in his WBCs.  He does not have any fevers, chills, vomiting at this time.  Does not appear to have a rigid stomach per him.  This is a telephone visit so assessment is limited.  Advised to maintain hydration with Gatorade, can drink ginger ale or some other clear soda that can I can help with some carbonation to help with burping to prevent excess gas.  Also advised to maintain water as he can.  If the magnesium citrate is successful he can resume a full liquid diet to bland diet for 2 to 3 days after constipation resolves as he might have some diarrhea and irritable bowel secondary to the magnesium citrate.   Additionally he has an appointment with GI this afternoon at 340.  Hopefully they can provide some guidance as to if they think he needs to do something additional and or be seen or scanned secondary to symptoms.  Appreciate the collaboration in his care.  Additionally he was advised if he develops a fever, chills doubling over abdominal pain, rigid stomach, nausea and vomiting to go to the nearest emergency room as these are all signs of obstruction of bowels.   Reviewed side effects, risks and benefits of medication.   Patient acknowledged agreement and understanding of the plan.   Time:   Today, I have spent 13 minutes with the patient with telehealth technology discussing the above problems.     Medication Adjustments/Labs and Tests Ordered: Current medicines are reviewed at length with the patient today.  Concerns regarding medicines are outlined above.   Tests Ordered: No orders of the defined types were placed in this encounter.   Medication Changes: No orders of the defined types were placed in this encounter.   Disposition:  Follow up prn  Signed, [name], NP  12/09/2018 10:29 AM     [place]

## 2018-12-09 NOTE — Telephone Encounter (Signed)
AB FYI-Pt went to the ED Monday 12/07/18 for pain from his hernia. Pt also will discuss constipation at his appointment today.

## 2018-12-09 NOTE — Progress Notes (Signed)
Primary Care Physician:  [name], NP  Primary GI: Dr. Gala Romney  Virtual Visit via Telephone Note Due to COVID-19, visit is conducted virtually and was requested by patient.   I connected with Brent Wilkins on 12/09/18 at  3:30 PM EDT by telephone and verified that I am speaking with the correct person using two identifiers.   I discussed the limitations, risks, security and privacy concerns of performing an evaluation and management service by telephone and the availability of in person appointments. I also discussed with the patient that there may be a patient responsible charge related to this service. The patient expressed understanding and agreed to proceed.  Chief Complaint  Patient presents with  . Abdominal Pain    F/U from hiatal hernia upset, Was at [facility],constipation,Tried 2 fleet enemas, stool softeners,and mag citrate,Miralax in the past 2 days with no success     History of Present Illness: 78 year old male for virtual video visit today due to constipation/obstipation. Last seen in Aug 2019 with history of GERD and dysphagia. Was doing well at that time.   Went to ED on 5/4. Had gotten up early picking strawberries. Went to ED. Top of stomach was in pain. Has had two episodes of this in his life. No protrusion. No further abdominal pain. Worried about hiatal hernia.   Had a little soup yesterday, liquids. Last BM on Sunday morning. No issues with constipation historically. Given dicyclomine 20 mg and took one dose per patient. Very nauseated.   Took mag citrate today around 11 am. Previously taken 2 fleet enemas, stool softeners, and mag citrate, Miralax.   Afib started back up with acute symptoms on Monday.     Past Medical History:  Diagnosis Date  . Allergy    hay fever;  seasonal  . Arthritis    right ankle  . Cataract    surgery 2016  . Chronic kidney disease    stones many years ago  . Dizziness 12/13/2015  . Dysphagia   . Frequent urination at  night   . GERD (gastroesophageal reflux disease)   . History of kidney stones   . HTN (hypertension)   . Hypothyroidism   . 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 Neuropathy    Bilateral ankles  . Paroxysmal atrial fibrillation (HCC)    a. on Tikosyn and Eliquis  . Polyneuropathy 06/16/2017  . Prostate cancer [facility]) 2010   Dr. Rosana Hoes  . Schatzki's ring 10/22/2011  . Seasonal allergies   . Syncope  dx. 8 yrs ago     Past Surgical History:  Procedure Laterality Date  . APPENDECTOMY    . CATARACT EXTRACTION W/PHACO Left 03/27/2015   Procedure: CATARACT EXTRACTION PHACO AND INTRAOCULAR LENS PLACEMENT LEFT EYE CDE=11.73;  Surgeon: Tonny Branch, MD;  [facility];  Service: Ophthalmology;  Laterality: Left;  . CATARACT EXTRACTION W/PHACO Right 03/30/2015   Procedure: CATARACT EXTRACTION PHACO AND INTRAOCULAR LENS PLACEMENT RIGHT EYE CDE=7.35;  Surgeon: Tonny Branch, MD;  [facility];  Service: Ophthalmology;  Laterality: Right;  . CHOLECYSTECTOMY    . COLONOSCOPY  07/23/10   Dr. Vivi Ferns rectum, scattered pancolonic diverticula  . COLONOSCOPY  08/10/1999   internal hemorrhoids,inflammatory polyp  . ESOPHAGOGASTRODUODENOSCOPY  04/12/08   Prominent Schatzki's ring, erosive reflux esophagitis, multiple antral erosions, small hiatal hernia, reactive gastropathy, status post dilation with 17F  . ESOPHAGOGASTRODUODENOSCOPY  10/28/2011   Procedure: ESOPHAGOGASTRODUODENOSCOPY (EGD);  Surgeon: Daneil Dolin, MD;  [facility];  Service: Endoscopy;  Laterality: N/A;  1:30  . ESOPHAGOGASTRODUODENOSCOPY N/A 09/10/2017   Dr. Gala Romney: Schatkzi's ring s/p dilation at GE junction, mild esophageal reflux esophagitis, medium sized hiatal hernia, normal duodenum. 56 and 63 Fr dilation  . EYE SURGERY    . MALONEY DILATION  10/28/2011   Procedure: Venia Minks DILATION;  Surgeon: Daneil Dolin, MD;  [facility];  Service: Endoscopy;  Laterality: N/A;  . Venia Minks DILATION N/A 09/10/2017    Procedure: Venia Minks DILATION;  Surgeon: Daneil Dolin, MD;  [facility];  Service: Endoscopy;  Laterality: N/A;  . SAVORY DILATION  10/28/2011   Procedure: SAVORY DILATION;  Surgeon: Daneil Dolin, MD;  [facility];  Service: Endoscopy;  Laterality: N/A;  . TONSILLECTOMY    . TOTAL ANKLE ARTHROPLASTY Left 11/25/2013   Procedure: TOTAL ANKLE ARTHOPLASTY;  Surgeon: Wylene Simmer, MD;  Location: [place];  Service: Orthopedics;  Laterality: Left;  . TOTAL ANKLE ARTHROPLASTY Right 07/14/2014   dr hewitt  . TOTAL ANKLE ARTHROPLASTY Right 07/14/2014   Procedure: RIGHT TOTAL ANKLE ARTHOPLASTY;  Surgeon: Wylene Simmer, MD;  Location: [place];  Service: Orthopedics;  Laterality: Right;     Current Meds  Medication Sig  . amLODipine (NORVASC) 5 MG tablet Take 1 tablet (5 mg total) by mouth daily.  . celecoxib (CELEBREX) 200 MG capsule Take 200 mg by mouth daily as needed for mild pain.   . diphenhydramine-acetaminophen (TYLENOL PM) 25-500 MG TABS tablet Take 1 tablet by mouth at bedtime as needed. Takes for sleep and mild discomfort  . docusate sodium (COLACE) 100 MG capsule Take 100 mg by mouth as needed for mild constipation.  . dofetilide (TIKOSYN) 500 MCG capsule Take 1 capsule (500 mcg total) by mouth 2 (two) times daily.  Marland Kitchen ELIQUIS 5 MG TABS tablet TAKE 1 TABLET BY MOUTH TWO  TIMES DAILY  . finasteride (PROSCAR) 5 MG tablet Take 5 mg by mouth daily.  . fluticasone (FLONASE) 50 MCG/ACT nasal spray Place 1 spray into both nostrils daily as needed for allergies.  Marland Kitchen gabapentin (NEURONTIN) 300 MG capsule Take 300 mg by mouth 2 (two) times daily.   Marland Kitchen ibuprofen (ADVIL,MOTRIN) 200 MG tablet Take 200 mg by mouth every 6 (six) hours as needed for headache. 200-400 mg as needed for HA  . levothyroxine (SYNTHROID, LEVOTHROID) 75 MCG tablet Take 1 tablet (75 mcg total) by mouth daily.  Marland Kitchen loratadine (CLARITIN) 10 MG tablet Take 10 mg by mouth daily as needed for allergies.  Marland Kitchen losartan  (COZAAR) 50 MG tablet Take 1 tablet (50 mg total) by mouth daily.  . magnesium citrate SOLN Take 1 Bottle by mouth once.  . Omega-3 Fatty Acids (RA FISH OIL) 1400 MG CPDR Take 1,400 mg by mouth daily.  . pantoprazole (PROTONIX) 40 MG tablet Take 1 tablet (40 mg total) by mouth daily before breakfast.  . Polyethyl Glycol-Propyl Glycol (SYSTANE) 0.4-0.3 % SOLN Place 1 drop into both eyes 2 (two) times daily as needed (dry eyes).   . polyethylene glycol (MIRALAX / GLYCOLAX) 17 g packet Take 17 g by mouth daily.  . Tamsulosin HCl (FLOMAX) 0.4 MG CAPS Take 0.4 mg by mouth daily after breakfast.   . Turmeric 500 MG CAPS Take 500 mg by mouth daily.  . vitamin B-12 (CYANOCOBALAMIN) 500 MCG tablet Take 1,000 mcg by mouth daily.       Review of Systems: As mentioned in HPI   Observations/Objective: No distress. Video  call completed with patient. Abdomen round but when patient palpates abdomen, objectively appears soft. Wife at side.   Assessment and Plan: 77 year old male with constipation/obstipation. I have asked him to stop Bentyl and avoid this going forward. Multiple OTC agents tried thus far. Does not clinically seem to be obstructed.   Discussed Miralax purge. Patient informed of signs/symptoms that would warrant urgent evaluation.   Addendum: shortly after virtual visit, patient's wife called and stated that he had started to have small BMs. +flatus. Will still take Miralax 1 capful every hour with full glass of water until starts to have productive BMs. Hold Miralax if diarrhea.  Will pursue UGI electively in future to assess hernia due to patient's concern for this.     Follow Up Instructions:    I discussed the assessment and treatment plan with the patient. The patient was provided an opportunity to ask questions and all were answered. The patient agreed with the plan and demonstrated an understanding of the instructions.   The patient was advised to call back or seek an in-person  evaluation if the symptoms worsen or if the condition fails to improve as anticipated.  I provided 20 minutes of non-face-to-face time during this encounter.  Annitta Needs, PhD, ANP-BC [facility]

## 2018-12-09 NOTE — Patient Instructions (Signed)
Take 1 capful of Miralax with 8 ounces of water every hour up to 6 doses.   We will order an upper GI xray to check out your hernia once things calm down!  It was a pleasure to see you today. I strive to create trusting relationships with patients to provide genuine, compassionate, and quality care. I value your feedback. If you receive a survey regarding your visit,  I greatly appreciate you taking time to fill this out.   Annitta Needs, PhD, ANP-BC [facility]

## 2018-12-09 NOTE — Telephone Encounter (Signed)
Pt's wife called to say that pt has doxy appt today but needed something for pain called in before then. Pt was seen in the ED and hasn't improved per wife. Wife was told that everything would be addressed during his DOXY appt and I would put a phone note in for the nurse. [phone]

## 2018-12-09 NOTE — Patient Instructions (Addendum)
Thank you for completing your visit via telemedicine. I appreciate the opportunity to provide you with the care for your health and wellness. Today we discussed: Constipation  Secondary to use of enemas, MiraLAX without success.  We are going to trial a dose of magnesium citrate which hopefully will help loosen any stool that is hardened up high or possibly trying to become an obstruction.  Your advised to take Gas-X, or some form of simethicone to help with excessive bloating that you might be experiencing.  Please keep hydrated with Gatorade, ginger ale, water as you can.  If magnesium citrate is successful.  Please follow a full liquid to bland diet for 2 to 3 days after constipation resolves.   If GI has additional and or other orders, test, or plans we will follow through the epic chart to see what is going on.  Please feel free to contact us if you have any questions or concerns.  If you develop fever, chills, doubling over abdominal pain, ridged stomach, nausea and vomiting please go to the nearest ED as this might be a sign of obstruction in the bowels.  Truly hope that you feel better in the coming days.  [place] YOUR HANDS WELL AND FREQUENTLY. AVOID TOUCHING YOUR FACE, UNLESS YOUR HANDS ARE FRESHLY WASHED.  GET FRESH AIR DAILY. STAY HYDRATED WITH WATER Full Liquid Diet A full liquid diet refers to fluids and foods that are liquid or will become liquid at room temperature. This diet should only be used for a short period of time to help you recover from illness or surgery. Your health care provider or dietitian will help you determine when it is safe to eat regular foods. What are tips for following this plan?     Reading food labels  Check food labels of nutrition shakes for the amount of protein. Look for nutrition shakes that have at least 8-10 grams of protein in each serving.  Look for drinks, such as milks and juices, that are "fortified" or "enriched." This means that  vitamins and minerals have been added. Shopping  Buy premade nutritional shakes to keep on hand.  To vary your choices, buy different flavors of milks and shakes. Meal planning  Choose flavors and foods that you enjoy.  To make sure you get enough energy from food (calories): ? Eat 3 full liquid meals each day. Have a liquid snack between each meal. ? Drink 6-8 ounces (177-237 ml) of a nutrition supplement shake with meals or as snacks. ? Add protein powder, powdered milk, milk, or yogurt to shakes to increase the amount of protein.  Drink at least one serving a day of citrus fruit juice or fruit juice that has vitamin C added. General guidelines  Before starting the full-liquid diet, check with your health care provider to know what foods you should avoid. These may include full-fat or high-fiber liquids.  You may have any liquid or food that becomes a liquid at room temperature. The food is considered a liquid if it can be poured off a spoon at room temperature.  Do not drink alcohol unless approved by your health care provider.  This diet gives you most of the nutrients that you need for energy, but you may not get enough of certain vitamins, minerals, and fiber. Make sure to talk to your health care provider or dietitian about: ? How many calories you need to eat get day. ? How much fluid you should have each day. ? Taking  a multivitamin or a nutritional supplement. What foods are allowed? The items listed may not be a complete list. Talk with your dietitian about what dietary choices are best for you. Grains Thin hot cereal, such as cream of wheat. Soft-cooked pasta or rice pured in soup. Vegetables Pulp-free tomato or vegetable juice. Vegetables pured in soup. Fruits Fruit juice without pulp. Strained fruit pures (seeds and skins removed). Meats and other protein foods Beef, chicken, and fish broths. Powdered protein supplements. Dairy Milk and milk-based beverages,  including milk shakes and instant breakfast mixes. Smooth yogurt. Pured cottage cheese. Beverages Water. Coffee and tea (caffeinated or decaffeinated). Cocoa. Liquid nutritional supplements. Soft drinks. Nondairy milks, such as almond, coconut, rice, or soy milk. Fats and oils Melted margarine and butter. Cream. Canola, almond, avocado, corn, grapeseed, sunflower, and sesame oils. Gravy. Sweets and desserts Custard. Pudding. Flavored gelatin. Smooth ice cream (without nuts or candy pieces). Sherbet. Popsicles. [place] ice. Pudding pops. Seasoning and other foods Salt and pepper. Spices. Cocoa powder. Vinegar. Ketchup. Yellow mustard. Smooth sauces, such as [place]aise, cheese sauce, or white sauce. Soy sauce. Cream soups. Strained soups. Syrup. Honey. Jelly (without fruit pieces). What foods are not allowed? The items listed may not be a complete list. Talk with your dietitian about what dietary choices are best for you. Grains Whole grains. Pasta. Rice. Cold cereal. Bread. Crackers. Vegetables All whole fresh, frozen, or canned vegetables. Fruits All whole fresh, frozen, or canned fruits. Meats and other protein foods All cuts of meat, poultry, and fish. Eggs. Tofu and soy protein. Nuts and nut butters. Lunch meat. Sausage. Dairy Hard cheese. Yogurt with fruit chunks. Fats and oils Coconut oil. Palm oil. Lard. Cold butter. Sweets and desserts Ice cream or other frozen desserts that have any solids in them or on top, such as nuts, chocolate chips, and pieces of cookies. Cakes. Cookies. Candy. Seasoning and other foods Stone-ground mustards. Soups with chunks or pieces. Summary  A full liquid diet refers to fluids and foods that are liquid or will become liquid at room temperature.  This diet should only be used for a short period of time to help you recover from illness or surgery. Ask your health care provider or dietitian when it is safe for you to eat regular foods.  To make sure  you get enough calories and nutrients, eat 3 meals each day with snacks between. Drink premade nutrition supplement shakes or add protein powder to homemade shakes. Take a vitamin and mineral supplement as told by your health care provider. This information is not intended to replace advice given to you by your health care provider. Make sure you discuss any questions you have with your health care provider. Document Released: 07/22/2005 Document Revised: 09/04/2016 Document Reviewed: 09/04/2016 Elsevier Interactive Patient Education  [employer]. .   It was a pleasure to see you and I look forward to continuing to work together on your health and well-being. Please do not hesitate to call the office if you need care or have questions about your care.  Have a wonderful day and week. With Gratitude, Cherly Beach, DNP, AGNP-BC    Criss Rosales Diet A bland diet consists of foods that are often soft and do not have a lot of fat, fiber, or extra seasonings. Foods without fat, fiber, or seasoning are easier for the body to digest. They are also less likely to irritate your mouth, throat, stomach, and other parts of your digestive system. A bland diet is sometimes called  a BRAT diet. What is my plan? Your health care provider or food and nutrition specialist (dietitian) may recommend specific changes to your diet to prevent symptoms or to treat your symptoms. These changes may include:  Eating small meals often.  Cooking food until it is soft enough to chew easily.  Chewing your food well.  Drinking fluids slowly.  Not eating foods that are very spicy, sour, or fatty.  Not eating citrus fruits, such as oranges and grapefruit. What do I need to know about this diet?  Eat a variety of foods from the bland diet food list.  Do not follow a bland diet longer than needed.  Ask your health care provider whether you should take vitamins or supplements. What foods can I eat? Grains  Hot  cereals, such as cream of wheat. Rice. Bread, crackers, or tortillas made from refined white flour. Vegetables Canned or cooked vegetables. Mashed or boiled potatoes. Fruits  Bananas. Applesauce. Other types of cooked or canned fruit with the skin and seeds removed, such as canned peaches or pears. Meats and other proteins  Scrambled eggs. Creamy peanut butter or other nut butters. Lean, well-cooked meats, such as chicken or fish. Tofu. Soups or broths. Dairy Low-fat dairy products, such as milk, cottage cheese, or yogurt. Beverages  Water. Herbal tea. Apple juice. Fats and oils Mild salad dressings. Canola or olive oil. Sweets and desserts Pudding. Custard. Fruit gelatin. Ice cream. The items listed above may not be a complete list of recommended foods and beverages. Contact a dietitian for more options. What foods are not recommended? Grains Whole grain breads and cereals. Vegetables Raw vegetables. Fruits Raw fruits, especially citrus, berries, or dried fruits. Dairy Whole fat dairy foods. Beverages Caffeinated drinks. Alcohol. Seasonings and condiments Strongly flavored seasonings or condiments. Hot sauce. Salsa. Other foods Spicy foods. Fried foods. Sour foods, such as pickled or fermented foods. Foods with high sugar content. Foods high in fiber. The items listed above may not be a complete list of foods and beverages to avoid. Contact a dietitian for more information. Summary  A bland diet consists of foods that are often soft and do not have a lot of fat, fiber, or extra seasonings.  Foods without fat, fiber, or seasoning are easier for the body to digest.  Check with your health care provider to see how long you should follow this diet plan. It is not meant to be followed for long periods. This information is not intended to replace advice given to you by your health care provider. Make sure you discuss any questions you have with your health care provider.  Document Released: 11/13/2015 Document Revised: 08/20/2017 Document Reviewed: 08/20/2017 Elsevier Interactive Patient Education  2019 [employer].

## 2018-12-14 ENCOUNTER — Telehealth: Payer: Self-pay | Admitting: Internal Medicine

## 2018-12-14 ENCOUNTER — Telehealth: Payer: Self-pay | Admitting: Gastroenterology

## 2018-12-14 DIAGNOSIS — N138 Other obstructive and reflux uropathy: Secondary | ICD-10-CM | POA: Diagnosis not present

## 2018-12-14 NOTE — Telephone Encounter (Signed)
Spoke with pt who states that states he would like to know if his heart is in rhythm. He would like to have ekg before next visit. Please advise.

## 2018-12-14 NOTE — Telephone Encounter (Signed)
Patient calling to ask if he needs to keep upcoming appt - due to recently being in ED Maybe on call around 11:15 - asking to please call before or after this time

## 2018-12-14 NOTE — Telephone Encounter (Signed)
Lmom, waiting on a return call.  

## 2018-12-14 NOTE — Telephone Encounter (Signed)
Can we check on patient to see how he is doing? Episode of constipation/obstipation last week.

## 2018-12-14 NOTE — Telephone Encounter (Signed)
Patient wanted EKG before his phone visit with Dr Harrington Challenger on 5/18.He is to come by [facility] office on 5/15 at 10 am for ekg

## 2018-12-15 NOTE — Telephone Encounter (Signed)
We can arrange follow-up in 2 months. Thanks for update! Stacey please schedule for 2 month follow-up.

## 2018-12-15 NOTE — Progress Notes (Signed)
cc'ed to pcp °

## 2018-12-15 NOTE — Telephone Encounter (Signed)
Pt said he feels the constipation was due to the medication he was given at the hospitial. Thursday after he got off the phone, he had a small bowel movement. Pt also had a small bowel movement on Friday and reports on Sunday more can out. Pt's pain has stopped. He has some sensitivity but the pain has decreased. Pt does want to come in the office when things calm down with covid-19 to get a scope he said.

## 2018-12-15 NOTE — Telephone Encounter (Signed)
Noted  

## 2018-12-16 ENCOUNTER — Encounter: Payer: Self-pay | Admitting: Internal Medicine

## 2018-12-16 NOTE — Telephone Encounter (Signed)
Scheduled and letter sent

## 2018-12-18 ENCOUNTER — Other Ambulatory Visit: Payer: Self-pay

## 2018-12-18 ENCOUNTER — Ambulatory Visit (INDEPENDENT_AMBULATORY_CARE_PROVIDER_SITE_OTHER): Payer: Medicare Other | Admitting: Internal Medicine

## 2018-12-18 VITALS — BP 142/83 | HR 76 | Temp 98.0°F | Ht 70.0 in | Wt 196.0 lb

## 2018-12-18 DIAGNOSIS — I4819 Other persistent atrial fibrillation: Secondary | ICD-10-CM

## 2018-12-18 NOTE — Progress Notes (Signed)
Patient had afib at ED visit a couple weeks ago, wants EKG before VV with Dr Harrington Challenger on Monday    EKG today is SR

## 2018-12-18 NOTE — Patient Instructions (Signed)
You will have you virtual visit with dr Harrington Challenger on Monday

## 2018-12-21 ENCOUNTER — Encounter: Payer: Self-pay | Admitting: Internal Medicine

## 2018-12-21 ENCOUNTER — Telehealth (INDEPENDENT_AMBULATORY_CARE_PROVIDER_SITE_OTHER): Payer: Medicare Other | Admitting: Internal Medicine

## 2018-12-21 VITALS — BP 124/69 | HR 60 | Ht 70.0 in | Wt 195.0 lb

## 2018-12-21 DIAGNOSIS — I480 Paroxysmal atrial fibrillation: Secondary | ICD-10-CM

## 2018-12-21 DIAGNOSIS — I4819 Other persistent atrial fibrillation: Secondary | ICD-10-CM | POA: Diagnosis not present

## 2018-12-21 DIAGNOSIS — K219 Gastro-esophageal reflux disease without esophagitis: Secondary | ICD-10-CM

## 2018-12-21 NOTE — Patient Instructions (Signed)
Medication Instructions: Your physician recommends that you continue on your current medications as directed. Please refer to the Current Medication list given to you today.   Labwork: None today  Procedures/Testing: Your physician has recommended that you wear an event monitor. Event monitors are medical devices that record the heart's electrical activity. Doctors most often [place] these monitors to diagnose arrhythmias. Arrhythmias are problems with the speed or rhythm of the heartbeat. The monitor is a small, portable device. You can wear one while you do your normal daily activities. This is usually used to diagnose what is causing palpitations/syncope (passing out).    Follow-Up: Dr Curt Bears this summer  Any Additional Special Instructions Will Be Listed Below (If Applicable).     If you need a refill on your cardiac medications before your next appointment, please call your pharmacy.

## 2018-12-21 NOTE — Progress Notes (Signed)
Virtual Visit via Video Note   This visit type was conducted due to national recommendations for restrictions regarding the COVID-19 Pandemic (e.g. social distancing) in an effort to limit this patient's exposure and mitigate transmission in our community.  Due to his co-morbid illnesses, this patient is at least at moderate risk for complications without adequate follow up.  This format is felt to be most appropriate for this patient at this time.  All issues noted in this document were discussed and addressed.  A limited physical exam was performed with this format.  Please refer to the patient's chart for his consent to telehealth for [facility].   Date:  12/21/2018   ID:  Brent Wilkins, DOB 1940/11/09, MRN [id number]  Patient Location: Home Provider Location: Home  PCP:  [name], NP  Cardiologist:  Dorris Carnes, MD  Electrophysiologist:  Constance Haw, MD   Evaluation Performed:  Follow-Up Visit  Chief Complaint:  F/U of PAF and HTN  History of Present Illness:    Brent Wilkins is a 78 y.o. male with hx of PAF (on Tikosyn), HTN, syncope, HL   Last seen in cardiology by Elliot Cousin in Nov 2019  The patient was seen in [facility] ED on 12/07/18   Had been outside picking strawberries   Developed epigastric discomfort   Had once before   NOt doing much when occurred  No dyspnea  Did have bowel irreg   Does have hx "hiatal hernia" The pt went to ED   EKG done   Found to be in atrial fibrillation  HR slow (chronic)  PT felt better   [place] home    Since then he hs not felt "up to par"   A little dizzy   Does have some HA behind eyes.  Denies reflux   No palpitaitons  EKG done last week   SR RBBB   [id number]      The patient does not have symptoms concerning for COVID-19 infection (fever, chills, cough, or new shortness of breath).    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 on Tikosyn and Eliquis   Polyneuropathy 06/16/2017   Prostate cancer (HCC) 2010   Dr. Rosana Hoes   Schatzki's ring 10/22/2011   Seasonal allergies    Syncope  dx. 8 yrs ago   Past Surgical History:  Procedure Laterality Date   APPENDECTOMY     CATARACT EXTRACTION W/PHACO Left 03/27/2015   Procedure: CATARACT EXTRACTION PHACO AND INTRAOCULAR LENS PLACEMENT LEFT EYE CDE=11.73;  Surgeon: Tonny Branch, MD;  Location: [facility];  Service: Ophthalmology;  Laterality: Left;   CATARACT EXTRACTION W/PHACO Right 03/30/2015   Procedure: CATARACT EXTRACTION PHACO AND INTRAOCULAR LENS PLACEMENT RIGHT EYE CDE=7.35;  Surgeon: Tonny Branch, MD;  Location: [facility];  Service: Ophthalmology;  Laterality: Right;   CHOLECYSTECTOMY     COLONOSCOPY  07/23/10   Dr. Vivi Ferns  rectum, scattered pancolonic diverticula   COLONOSCOPY  08/10/1999   internal hemorrhoids,inflammatory polyp   ESOPHAGOGASTRODUODENOSCOPY  04/12/08   Prominent Schatzki's ring, erosive reflux esophagitis, multiple antral erosions, small hiatal hernia, reactive gastropathy, status post dilation with 62F   ESOPHAGOGASTRODUODENOSCOPY  10/28/2011   Procedure: ESOPHAGOGASTRODUODENOSCOPY (EGD);  Surgeon: Daneil Dolin, MD;  Location: [facility];  Service: Endoscopy;  Laterality: N/A;  1:30   ESOPHAGOGASTRODUODENOSCOPY N/A 09/10/2017   Dr. Gala Romney: Schatkzi's ring s/p dilation at GE junction, mild esophageal reflux esophagitis, medium sized hiatal hernia, normal duodenum. 60 and 79 Fr dilation   EYE SURGERY     MALONEY DILATION  10/28/2011   Procedure: MALONEY DILATION;  Surgeon: Daneil Dolin,  MD;  Location: [facility];  Service: Endoscopy;  Laterality: N/A;   MALONEY DILATION N/A 09/10/2017   Procedure: Venia Minks DILATION;  Surgeon: Daneil Dolin, MD;  Location: [facility];  Service: Endoscopy;  Laterality: N/A;   SAVORY DILATION  10/28/2011   Procedure: SAVORY DILATION;  Surgeon: Daneil Dolin, MD;  Location: [facility];  Service: Endoscopy;  Laterality: N/A;   TONSILLECTOMY     TOTAL ANKLE ARTHROPLASTY Left 11/25/2013   Procedure: TOTAL ANKLE ARTHOPLASTY;  Surgeon: Wylene Simmer, MD;  Location: [place];  Service: Orthopedics;  Laterality: Left;   TOTAL ANKLE ARTHROPLASTY Right 07/14/2014   dr hewitt   TOTAL ANKLE ARTHROPLASTY Right 07/14/2014   Procedure: RIGHT TOTAL ANKLE ARTHOPLASTY;  Surgeon: Wylene Simmer, MD;  Location: [place];  Service: Orthopedics;  Laterality: Right;     Current Meds  Medication Sig   amLODipine (NORVASC) 5 MG tablet Take 1 tablet (5 mg total) by mouth daily.   celecoxib (CELEBREX) 200 MG capsule Take 200 mg by mouth daily as needed for mild pain.    diphenhydramine-acetaminophen (TYLENOL PM) 25-500 MG TABS tablet Take 1 tablet by mouth at bedtime as needed. Takes for sleep and mild discomfort   docusate sodium (COLACE) 100 MG capsule Take 100 mg by mouth as needed for mild constipation.   dofetilide (TIKOSYN) 500 MCG capsule Take 1 capsule (500 mcg total) by mouth 2 (two) times daily.   ELIQUIS 5 MG TABS tablet TAKE 1 TABLET BY MOUTH TWO  TIMES DAILY   finasteride (PROSCAR) 5 MG tablet Take 5 mg by mouth daily.   fluticasone (FLONASE) 50 MCG/ACT nasal spray Place 1 spray into both nostrils daily as needed for allergies.   gabapentin (NEURONTIN) 300 MG capsule Take 300 mg by mouth 2 (two) times daily.    ibuprofen (ADVIL,MOTRIN) 200 MG tablet Take 200 mg by mouth every 6 (six) hours as needed for headache. 200-400 mg as needed for HA   levothyroxine (SYNTHROID, LEVOTHROID) 75 MCG tablet Take 1 tablet (75 mcg total) by mouth daily.    loratadine (CLARITIN) 10 MG tablet Take 10 mg by mouth daily as needed for allergies.   losartan (COZAAR) 50 MG tablet Take 1 tablet (50 mg total) by mouth daily.   magnesium citrate SOLN Take 1 Bottle by mouth once.   Omega-3 Fatty Acids (RA FISH OIL) 1400 MG CPDR Take 1,400 mg by mouth daily.   pantoprazole (PROTONIX) 40 MG tablet Take 1 tablet (40 mg total) by mouth daily before breakfast.   Polyethyl Glycol-Propyl Glycol (SYSTANE) 0.4-0.3 % SOLN Place 1 drop into both eyes 2 (two) times daily as needed (dry eyes).    polyethylene glycol (MIRALAX / GLYCOLAX) 17 g packet Take 17 g by mouth daily.   Tamsulosin  HCl (FLOMAX) 0.4 MG CAPS Take 0.4 mg by mouth daily after breakfast.    Turmeric 500 MG CAPS Take 500 mg by mouth daily.   vitamin B-12 (CYANOCOBALAMIN) 500 MCG tablet Take 1,000 mcg by mouth daily.      Allergies:   Tetanus toxoids; Statins; Dicyclomine; Sulfamethoxazole; Doxycycline; Oxycodone; Penicillins; Sulfa antibiotics; and Xarelto [rivaroxaban]   Social History   Tobacco Use   Smoking status: Never Smoker   Smokeless tobacco: Never Used  Substance Use Topics   Alcohol use: Yes    Alcohol/week: 2.0 standard drinks    Types: 1 Glasses of wine, 1 Cans of beer per week    Comment: glass of wine or "couple of beers" occasionally    Drug use: No      Family Hx: The patient's family history includes ALS in his son; Arthritis in his mother; Cancer in his father; Heart disease in his mother; Hypertension in his father; Prostate cancer in his brother and father; Prostate cancer (age of onset: 85) in his son. There is no history of Colon cancer.  ROS:   Please see the history of present illness.     All other systems reviewed and are negative.   Prior CV studies:   The following studies were reviewed today: Labs/Other Tests and Data Reviewed:    EKG:   Recent Labs: 04/02/2018: ALT 9; TSH 3.50 06/23/2018: Magnesium 2.1 12/07/2018: BUN 17; Creatinine, Ser  1.32; Hemoglobin 16.4; Platelets 250; Potassium 4.2; Sodium 143   Recent Lipid Panel Lab Results  Component Value Date/Time   CHOL 179 04/02/2018 09:24 AM   TRIG 181 (H) 04/02/2018 09:24 AM   HDL 45 04/02/2018 09:24 AM   CHOLHDL 4.0 04/02/2018 09:24 AM   LDLCALC 104 (H) 04/02/2018 09:24 AM    Wt Readings from Last 3 Encounters:  12/21/18 195 lb (88.5 kg)  12/18/18 196 lb (88.9 kg)  12/09/18 197 lb (89.4 kg)     Objective:    Vital Signs:  BP 124/69    Pulse 60    Ht 5\' 10"  (1.778 m)    Wt 195 lb (88.5 kg)    BMI 27.98 kg/m    VITAL SIGNS:  reviewed  ASSESSMENT & PLAN:    1. 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 The importance of social distancing was discussed today.  Time:   Today, I have spent 20 minutes with the patient with telehealth technology discussing the above problems.     Medication Adjustments/Labs and Tests Ordered: Current medicines are reviewed at length with the patient today.  Concerns regarding medicines are outlined above.   Tests Ordered: No orders of the defined types were placed in this encounter.  Medication Changes: No orders of the defined types were placed in this encounter.   Disposition:  Follow up will be based on results  Signed, Dorris Carnes, MD  12/21/2018 10:07 AM    [place]

## 2018-12-25 ENCOUNTER — Ambulatory Visit (INDEPENDENT_AMBULATORY_CARE_PROVIDER_SITE_OTHER): Payer: Medicare Other

## 2018-12-25 DIAGNOSIS — I4819 Other persistent atrial fibrillation: Secondary | ICD-10-CM

## 2018-12-25 DIAGNOSIS — I4891 Unspecified atrial fibrillation: Secondary | ICD-10-CM | POA: Diagnosis not present

## 2018-12-30 ENCOUNTER — Ambulatory Visit: Payer: Medicare Other | Admitting: Family Medicine

## 2018-12-31 ENCOUNTER — Ambulatory Visit (INDEPENDENT_AMBULATORY_CARE_PROVIDER_SITE_OTHER): Payer: Medicare Other | Admitting: Family Medicine

## 2018-12-31 ENCOUNTER — Other Ambulatory Visit: Payer: Self-pay

## 2018-12-31 ENCOUNTER — Encounter: Payer: Medicare Other | Admitting: Family Medicine

## 2018-12-31 ENCOUNTER — Encounter: Payer: Self-pay | Admitting: Family Medicine

## 2018-12-31 VITALS — BP 121/66 | HR 63 | Ht 70.0 in | Wt 193.0 lb

## 2018-12-31 DIAGNOSIS — Z0000 Encounter for general adult medical examination without abnormal findings: Secondary | ICD-10-CM

## 2018-12-31 NOTE — Progress Notes (Signed)
Subjective:   Brent Wilkins is a 78 y.o. male who presents for Medicare Annual/Subsequent preventive examination.  Location of Patient: Home Location of Provider: Telehealth Consent was obtain for visit to be over via telehealth. I verified that I am speaking with the correct person using two identifiers.  Review of Systems:    Cardiac Risk Factors include: advanced age (>49men, >51 women);hypertension     Objective:    Vitals: BP 121/66   Pulse 63   Ht 5\' 10"  (1.778 m)   Wt 193 lb (87.5 kg)   BMI 27.69 kg/m   Body mass index is 27.69 kg/m.  Advanced Directives 12/07/2018 09/10/2017 05/05/2017 03/21/2017 03/27/2015 03/22/2015 07/15/2014  Does Patient Have a Medical Advance Directive? No No No No No No No  Would patient like information on creating a medical advance directive? No - Patient declined No - Patient declined No - Patient declined No - Patient declined No - patient declined information No - patient declined information No - patient declined information  Pre-existing out of facility DNR order (yellow form or pink MOST form) - - - - - - -    Tobacco Social History   Tobacco Use  Smoking Status Never Smoker  Smokeless Tobacco Never Used     Counseling given: Yes   Clinical Intake:  Pre-visit preparation completed: Yes  Pain : No/denies pain Pain Score: 0-No pain     BMI - recorded: 27.69 Nutritional Status: BMI 25 -29 Overweight Nutritional Risks: None Diabetes: No  How often do you need to have someone help you when you read instructions, pamphlets, or other written materials from your doctor or pharmacy?: 1 - Never What is the last grade level you completed in school?: college  Interpreter Needed?: No     Past Medical History:  Diagnosis Date  . Allergy    hay fever;  seasonal  . Arthritis    right ankle  . Cataract    surgery 2016  . Chronic kidney disease    stones many years ago  . Dizziness 12/13/2015  . Dysphagia   . Frequent  urination at night   . GERD (gastroesophageal reflux disease)   . History of kidney stones   . HTN (hypertension)   . Hypothyroidism   . 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 Neuropathy    Bilateral ankles  . Paroxysmal atrial fibrillation (HCC)    a. on Tikosyn and Eliquis  . Polyneuropathy 06/16/2017  . Prostate cancer [facility]) 2010   Dr. Rosana Hoes  . Schatzki's ring 10/22/2011  . Seasonal allergies   . Syncope  dx. 8 yrs ago   Past Surgical History:  Procedure Laterality Date  . APPENDECTOMY    . CATARACT EXTRACTION W/PHACO Left 03/27/2015   Procedure: CATARACT EXTRACTION PHACO AND INTRAOCULAR LENS PLACEMENT LEFT EYE CDE=11.73;  Surgeon: Tonny Branch, MD;  Location: [facility];  Service: Ophthalmology;  Laterality: Left;  . CATARACT EXTRACTION W/PHACO Right 03/30/2015   Procedure: CATARACT EXTRACTION PHACO AND INTRAOCULAR LENS PLACEMENT RIGHT EYE CDE=7.35;  Surgeon: Tonny Branch, MD;  Location: [facility];  Service: Ophthalmology;  Laterality: Right;  . CHOLECYSTECTOMY    . COLONOSCOPY  07/23/10   Dr. Vivi Ferns rectum, scattered pancolonic diverticula  . COLONOSCOPY  08/10/1999   internal hemorrhoids,inflammatory polyp  . ESOPHAGOGASTRODUODENOSCOPY  04/12/08   Prominent Schatzki's ring, erosive reflux esophagitis, multiple antral erosions, small hiatal hernia, reactive gastropathy, status post dilation with 24F  . ESOPHAGOGASTRODUODENOSCOPY  10/28/2011   Procedure:  ESOPHAGOGASTRODUODENOSCOPY (EGD);  Surgeon: Daneil Dolin, MD;  [facility]: [facility];  Service: Endoscopy;  Laterality: N/A;  1:30  . ESOPHAGOGASTRODUODENOSCOPY N/A 09/10/2017   Dr. Gala Romney: Schatkzi's ring s/p dilation at GE junction, mild esophageal reflux esophagitis, medium sized hiatal hernia, normal duodenum. 56 and 62 Fr dilation  . EYE SURGERY    . MALONEY DILATION  10/28/2011   Procedure: Venia Minks DILATION;  Surgeon: Daneil Dolin, MD;  [facility]: [facility];  Service: Endoscopy;  Laterality: N/A;  . Venia Minks DILATION N/A 09/10/2017    Procedure: Venia Minks DILATION;  Surgeon: Daneil Dolin, MD;  Location[facility];  Service: Endoscopy;  Laterality: N/A;  . SAVORY DILATION  10/28/2011   Procedure: SAVORY DILATION;  Surgeon: Daneil Dolin, MD;  [facility];  Service: Endoscopy;  Laterality: N/A;  . TONSILLECTOMY    . TOTAL ANKLE ARTHROPLASTY Left 11/25/2013   Procedure: TOTAL ANKLE ARTHOPLASTY;  Surgeon: Wylene Simmer, MD;  Location: [place];  Service: Orthopedics;  Laterality: Left;  . TOTAL ANKLE ARTHROPLASTY Right 07/14/2014   dr hewitt  . TOTAL ANKLE ARTHROPLASTY Right 07/14/2014   Procedure: RIGHT TOTAL ANKLE ARTHOPLASTY;  Surgeon: Wylene Simmer, MD;  Location: [place];  Service: Orthopedics;  Laterality: Right;   Family History  Problem Relation Age of Onset  . Prostate cancer Father   . Cancer Father        prostate to bone  . Hypertension Father   . Prostate cancer Son 14  . ALS Son   . Prostate cancer Brother   . Heart disease Mother        died at 80  . Arthritis Mother   . Colon cancer Neg Hx    Social History   Socioeconomic History  . Marital status: Married    Spouse name: Candace  . Number of children: 2  . Years of education: Not on file  . Highest education level: Bachelor's degree (e.g., BA, AB, BS)  Occupational History  . Occupation: retired; Ambulance person: RETIRED  Social Needs  . Financial resource strain: Not hard at all  . Food insecurity:    Worry: Never true    Inability: Never true  . Transportation needs:    Medical: No    Non-medical: No  Tobacco Use  . Smoking status: Never Smoker  . Smokeless tobacco: Never Used  Substance and Sexual Activity  . Alcohol use: Yes    Alcohol/week: 2.0 standard drinks    Types: 1 Glasses of wine, 1 Cans of beer per week    Comment: glass of wine or "couple of beers" occasionally   . Drug use: No  . Sexual activity: Yes    Partners: Female  Lifestyle  . Physical activity:    Days per week: 5 days     Minutes per session: 30 min  . Stress: Not at all  Relationships  . Social connections:    Talks on phone: More than three times a week    Gets together: More than three times a week    Attends religious service: More than 4 times per year    Active member of club or organization: No    Attends meetings of clubs or organizations: Never    Relationship status: Married  Other Topics Concern  . Not on file  Social History Narrative   Lives with wife Candace.       Moved from [place], [place]   Retired from  Sales.   Plays golf.   Works with [employer] and [employer] for [employer].   Wears seatbelt.   Drives.   Eats all food groups.   Never smoked.   Married for over 51 years June  (2020)   Has two grown children, live in [place] and [place].    Outpatient Encounter Medications as of 12/31/2018  Medication Sig  . amLODipine (NORVASC) 5 MG tablet Take 1 tablet (5 mg total) by mouth daily.  . celecoxib (CELEBREX) 200 MG capsule Take 200 mg by mouth daily as needed for mild pain.   . diphenhydramine-acetaminophen (TYLENOL PM) 25-500 MG TABS tablet Take 1 tablet by mouth at bedtime as needed. Takes for sleep and mild discomfort  . docusate sodium (COLACE) 100 MG capsule Take 100 mg by mouth as needed for mild constipation.  . dofetilide (TIKOSYN) 500 MCG capsule Take 1 capsule (500 mcg total) by mouth 2 (two) times daily.  Marland Kitchen ELIQUIS 5 MG TABS tablet TAKE 1 TABLET BY MOUTH TWO  TIMES DAILY  . finasteride (PROSCAR) 5 MG tablet Take 5 mg by mouth daily.  . fluticasone (FLONASE) 50 MCG/ACT nasal spray Place 1 spray into both nostrils daily as needed for allergies.  Marland Kitchen gabapentin (NEURONTIN) 300 MG capsule Take 300 mg by mouth 2 (two) times daily.   Marland Kitchen ibuprofen (ADVIL,MOTRIN) 200 MG tablet Take 200 mg by mouth every 6 (six) hours as needed for headache. 200-400 mg as needed for HA  . levothyroxine (SYNTHROID, LEVOTHROID) 75 MCG tablet Take 1 tablet (75 mcg total) by mouth daily.  Marland Kitchen loratadine  (CLARITIN) 10 MG tablet Take 10 mg by mouth daily as needed for allergies.  Marland Kitchen losartan (COZAAR) 50 MG tablet Take 1 tablet (50 mg total) by mouth daily.  . magnesium citrate SOLN Take 1 Bottle by mouth once.  . Omega-3 Fatty Acids (RA FISH OIL) 1400 MG CPDR Take 1,400 mg by mouth daily.  . pantoprazole (PROTONIX) 40 MG tablet Take 1 tablet (40 mg total) by mouth daily before breakfast.  . Polyethyl Glycol-Propyl Glycol (SYSTANE) 0.4-0.3 % SOLN Place 1 drop into both eyes 2 (two) times daily as needed (dry eyes).   . polyethylene glycol (MIRALAX / GLYCOLAX) 17 g packet Take 17 g by mouth daily.  . Tamsulosin HCl (FLOMAX) 0.4 MG CAPS Take 0.4 mg by mouth daily after breakfast.   . Turmeric 500 MG CAPS Take 500 mg by mouth daily.  . vitamin B-12 (CYANOCOBALAMIN) 500 MCG tablet Take 1,000 mcg by mouth daily.    No facility-administered encounter medications on file as of 12/31/2018.     Activities of Daily Living In your present state of health, do you have any difficulty performing the following activities: 12/31/2018  Hearing? N  Comment wears hearing aids  Vision? N  Difficulty concentrating or making decisions? N  Walking or climbing stairs? N  Dressing or bathing? N  Doing errands, shopping? N  Preparing Food and eating ? N  Using the Toilet? N  In the past six months, have you accidently leaked urine? N  Do you have problems with loss of bowel control? N  Managing your Medications? N  Managing your Finances? N  Housekeeping or managing your Housekeeping? N  Some recent data might be hidden    Patient Care Team: [name], NP as PCP - General Constance Haw, MD as PCP - Electrophysiology (Cardiology) Fay Records, MD as PCP - Cardiology (Cardiology) Gala Romney Cristopher Estimable, MD (Gastroenterology) Tresa Endo  L III, MD as Attending Physician (Urology)   Assessment:   This is a routine wellness examination for Darrious.  Exercise Activities and Dietary recommendations  Current Exercise Habits: Home exercise routine, Type of exercise: walking, Time (Minutes): 45, Frequency (Times/Week): 7, Weekly Exercise (Minutes/Week): 315, Intensity: Moderate, Exercise limited by: None identified  Goals    . Exercise 150 minutes per week (moderate activity)       Fall Risk Fall Risk  12/31/2018 12/09/2018 09/22/2018 04/07/2018 01/02/2018  Falls in the past year? 0 0 0 No No  Number falls in past yr: - - 0 - -  Injury with Fall? 0 0 0 - -  Risk Factor Category  - - - - -  Risk for fall due to : - - - - -   Is the patient's home free of loose throw rugs in walkways, pet beds, electrical cords, etc?   yes      Grab bars in the bathroom? yes      Handrails on the stairs?   yes      Adequate lighting?   yes  Timed Get Up and Go Performed:  Telemedicine not performed  Depression Screen PHQ 2/9 Scores 12/31/2018 12/09/2018 09/22/2018 04/07/2018  PHQ - 2 Score 0 0 0 0    Cognitive Function     6CIT Screen 12/31/2018  What Year? 0 points  What month? 0 points  What time? 0 points  Count back from 20 0 points  Months in reverse 0 points  Repeat phrase 0 points  Total Score 0    Immunization History  Administered Date(s) Administered  . Influenza, High Dose Seasonal PF 05/07/2017  . Pneumococcal Conjugate-13 06/16/2017  . Pneumococcal Polysaccharide-23 01/02/2018  . Zoster Recombinat (Shingrix) 04/07/2018, 09/22/2018    Qualifies for Shingles Vaccine? Completed   Screening Tests Health Maintenance  Topic Date Due  . INFLUENZA VACCINE  03/06/2019  . PNA vac Low Risk Adult  Completed  . TETANUS/TDAP  Discontinued   Cancer Screenings: Lung: Low Dose CT Chest recommended if Age 41-80 years, 30 pack-year currently smoking OR have quit w/in 15years. Patient does not qualify. Colorectal:  Completed   Additional Screenings:  Hepatitis C Screening: add to the next labs      Plan:      1. Encounter for Medicare annual wellness exam  I have personally reviewed  and noted the following in the patient's chart:   . Medical and social history . Use of alcohol, tobacco or illicit drugs  . Current medications and supplements . Functional ability and status . Nutritional status . Physical activity . Advanced directives . List of other physicians . Hospitalizations, surgeries, and ER visits in previous 12 months . Vitals . Screenings to include cognitive, depression, and falls . Referrals and appointments  In addition, I have reviewed and discussed with patient certain preventive protocols, quality metrics, and best practice recommendations. A written personalized care plan for preventive services as well as general preventive health recommendations were provided to patient.     I provided 20 minutes of non-face-to-face time during this encounter.  [name], NP  12/31/2018

## 2018-12-31 NOTE — Patient Instructions (Signed)
Brent Wilkins , Thank you for taking time to come for your Medicare Wellness Visit. I appreciate your ongoing commitment to your health goals. Please review the following plan we discussed and let me know if I can assist you in the future.   Screening recommendations/referrals: Colonoscopy: completed-not needed unless changes occur Recommended yearly ophthalmology/optometry visit for glaucoma screening and checkup Recommended yearly dental visit for hygiene and checkup  Vaccinations: Influenza vaccine: Due Fall 2020 Pneumococcal vaccine: Completed  Tdap vaccine: Up to date  Shingles vaccine: Completed   Conditions/risks identified: Falls are always a risk. Please review the below information to address anything that is preventive in your care.  Next appointment: 04/27/2019, Annual Visit   Preventive Care 80 Years and Older, Male Preventive care refers to lifestyle choices and visits with your health care provider that can promote health and wellness. What does preventive care include?  A yearly physical exam. This is also called an annual well check.  Dental exams once or twice a year.  Routine eye exams. Ask your health care provider how often you should have your eyes checked.  Personal lifestyle choices, including:  Daily care of your teeth and gums.  Regular physical activity.  Eating a healthy diet.  Avoiding tobacco and drug use.  Limiting alcohol use.  Practicing safe sex.  Taking low doses of aspirin every day.  Taking vitamin and mineral supplements as recommended by your health care provider. What happens during an annual well check? The services and screenings done by your health care provider during your annual well check will depend on your age, overall health, lifestyle risk factors, and family history of disease. Counseling  Your health care provider may ask you questions about your:  Alcohol use.  Tobacco use.  Drug use.  Emotional well-being.   Home and relationship well-being.  Sexual activity.  Eating habits.  History of falls.  Memory and ability to understand (cognition).  Work and work Statistician. Screening  You may have the following tests or measurements:  Height, weight, and BMI.  Blood pressure.  Lipid and cholesterol levels. These may be checked every 5 years, or more frequently if you are over 51 years old.  Skin check.  Lung cancer screening. You may have this screening every year starting at age 16 if you have a 30-pack-year history of smoking and currently smoke or have quit within the past 15 years.  Fecal occult blood test (FOBT) of the stool. You may have this test every year starting at age 73.  Flexible sigmoidoscopy or colonoscopy. You may have a sigmoidoscopy every 5 years or a colonoscopy every 10 years starting at age 49.  Prostate cancer screening. Recommendations will vary depending on your family history and other risks.  Hepatitis C blood test.  Hepatitis B blood test.  Sexually transmitted disease (STD) testing.  Diabetes screening. This is done by checking your blood sugar (glucose) after you have not eaten for a while (fasting). You may have this done every 1-3 years.  Abdominal aortic aneurysm (AAA) screening. You may need this if you are a current or former smoker.  Osteoporosis. You may be screened starting at age 5 if you are at high risk. Talk with your health care provider about your test results, treatment options, and if necessary, the need for more tests. Vaccines  Your health care provider may recommend certain vaccines, such as:  Influenza vaccine. This is recommended every year.  Tetanus, diphtheria, and acellular pertussis (Tdap, Td) vaccine. You  may need a Td booster every 10 years.  Zoster vaccine. You may need this after age 3.  Pneumococcal 13-valent conjugate (PCV13) vaccine. One dose is recommended after age 40.  Pneumococcal polysaccharide (PPSV23)  vaccine. One dose is recommended after age 77. Talk to your health care provider about which screenings and vaccines you need and how often you need them. This information is not intended to replace advice given to you by your health care provider. Make sure you discuss any questions you have with your health care provider. Document Released: 08/18/2015 Document Revised: 04/10/2016 Document Reviewed: 05/23/2015 Elsevier Interactive Patient Education  2017 [place] Prevention in the Home Falls can cause injuries. They can happen to people of all ages. There are many things you can do to make your home safe and to help prevent falls. What can I do on the outside of my home?  Regularly fix the edges of walkways and driveways and fix any cracks.  Remove anything that might make you trip as you walk through a door, such as a raised step or threshold.  Trim any bushes or trees on the path to your home.  Use bright outdoor lighting.  Clear any walking paths of anything that might make someone trip, such as rocks or tools.  Regularly check to see if handrails are loose or broken. Make sure that both sides of any steps have handrails.  Any raised decks and porches should have guardrails on the edges.  Have any leaves, snow, or ice cleared regularly.  Use sand or salt on walking paths during winter.  Clean up any spills in your garage right away. This includes oil or grease spills. What can I do in the bathroom?  Use night lights.  Install grab bars by the toilet and in the tub and shower. Do not use towel bars as grab bars.  Use non-skid mats or decals in the tub or shower.  If you need to sit down in the shower, use a plastic, non-slip stool.  Keep the floor dry. Clean up any water that spills on the floor as soon as it happens.  Remove soap buildup in the tub or shower regularly.  Attach bath mats securely with double-sided non-slip rug tape.  Do not have throw rugs  and other things on the floor that can make you trip. What can I do in the bedroom?  Use night lights.  Make sure that you have a light by your bed that is easy to reach.  Do not use any sheets or blankets that are too big for your bed. They should not hang down onto the floor.  Have a firm chair that has side arms. You can use this for support while you get dressed.  Do not have throw rugs and other things on the floor that can make you trip. What can I do in the kitchen?  Clean up any spills right away.  Avoid walking on wet floors.  Keep items that you use a lot in easy-to-reach places.  If you need to reach something above you, use a strong step stool that has a grab bar.  Keep electrical cords out of the way.  Do not use floor polish or wax that makes floors slippery. If you must use wax, use non-skid floor wax.  Do not have throw rugs and other things on the floor that can make you trip. What can I do with my stairs?  Do not leave any items  on the stairs.  Make sure that there are handrails on both sides of the stairs and use them. Fix handrails that are broken or loose. Make sure that handrails are as long as the stairways.  Check any carpeting to make sure that it is firmly attached to the stairs. Fix any carpet that is loose or worn.  Avoid having throw rugs at the top or bottom of the stairs. If you do have throw rugs, attach them to the floor with carpet tape.  Make sure that you have a light switch at the top of the stairs and the bottom of the stairs. If you do not have them, ask someone to add them for you. What else can I do to help prevent falls?  Wear shoes that:  Do not have high heels.  Have rubber bottoms.  Are comfortable and fit you well.  Are closed at the toe. Do not wear sandals.  If you use a stepladder:  Make sure that it is fully opened. Do not climb a closed stepladder.  Make sure that both sides of the stepladder are locked into  place.  Ask someone to hold it for you, if possible.  Clearly mark and make sure that you can see:  Any grab bars or handrails.  First and last steps.  Where the edge of each step is.  Use tools that help you move around (mobility aids) if they are needed. These include:  Canes.  Walkers.  Scooters.  Crutches.  Turn on the lights when you go into a dark area. Replace any light bulbs as soon as they burn out.  Set up your furniture so you have a clear path. Avoid moving your furniture around.  If any of your floors are uneven, fix them.  If there are any pets around you, be aware of where they are.  Review your medicines with your doctor. Some medicines can make you feel dizzy. This can increase your chance of falling. Ask your doctor what other things that you can do to help prevent falls. This information is not intended to replace advice given to you by your health care provider. Make sure you discuss any questions you have with your health care provider. Document Released: 05/18/2009 Document Revised: 12/28/2015 Document Reviewed: 08/26/2014 Elsevier Interactive Patient Education  2017 [employer].

## 2019-01-04 ENCOUNTER — Other Ambulatory Visit: Payer: Self-pay

## 2019-01-06 ENCOUNTER — Telehealth: Payer: Self-pay | Admitting: Internal Medicine

## 2019-01-06 NOTE — Telephone Encounter (Signed)
Spoke with pt informed that monitor reading was downloaded today and sent to provider to be read. Notified pt that the office would call when results a available. Pt voiced understanding.

## 2019-01-06 NOTE — Telephone Encounter (Signed)
Patient called stating that he wants results of recent monitor.

## 2019-01-06 NOTE — Telephone Encounter (Signed)
Called to notify pt of the status of his heart monitor results. NA, LMTCB

## 2019-01-11 ENCOUNTER — Telehealth: Payer: Self-pay

## 2019-01-11 NOTE — Telephone Encounter (Signed)
-----   Message from [place], MD sent at 01/09/2019 10:41 PM EDT ----- Holter monitor shows SR and atrial fibrillation No significant bradycardia    Keep on current meds for now   WIll forward to [facility] for review, decision for Rx changes    Pt last seen by Dr Curt Bears in NOv 2019

## 2019-01-11 NOTE — Telephone Encounter (Signed)
Notes recorded by Frederik Schmidt, RN on 01/11/2019 at 9:23 AM EDT The patient has been notified of the result and verbalized understanding. All questions (if any) were answered. Frederik Schmidt, RN 01/11/2019 9:23 AM

## 2019-02-02 ENCOUNTER — Other Ambulatory Visit: Payer: Self-pay | Admitting: Internal Medicine

## 2019-02-08 ENCOUNTER — Other Ambulatory Visit: Payer: Self-pay | Admitting: Internal Medicine

## 2019-02-08 ENCOUNTER — Other Ambulatory Visit: Payer: Medicare Other

## 2019-02-08 DIAGNOSIS — Z20822 Contact with and (suspected) exposure to covid-19: Secondary | ICD-10-CM

## 2019-02-08 DIAGNOSIS — R6889 Other general symptoms and signs: Secondary | ICD-10-CM | POA: Diagnosis not present

## 2019-02-13 LAB — NOVEL CORONAVIRUS, NAA: SARS-CoV-2, NAA: NOT DETECTED

## 2019-02-18 ENCOUNTER — Telehealth: Payer: Self-pay | Admitting: Family Medicine

## 2019-02-18 NOTE — Telephone Encounter (Signed)
Patient advised his COVID 19 test results are negative.

## 2019-02-26 ENCOUNTER — Telehealth: Payer: Self-pay | Admitting: *Deleted

## 2019-02-26 NOTE — Telephone Encounter (Signed)
   Primary Cardiologist: Dorris Carnes, MD   Pt contacted.  History and symptoms reviewed.  Pt will f/u with [facility] provider as scheduled.  Pt. advised that we are restricting visitors at this time and request that only patients present for check-in prior to their appointment.  All other visitors should remain in their car.  If necessary, only one visitor may come with the patient, into the building. For everyone's safety, all patients and visitor entering our practice area should expect to be screened again prior to entering our waiting area.  Dandrae Kustra  02/26/2019 6:04 PM     COVID-19 Pre-Screening Questions:  . In the past 7 to 10 days have you had a cough,  shortness of breath, headache, congestion, fever (100 or greater) body aches, chills, sore throat, or sudden loss of taste or sense of smell?  NO . Have you been around anyone with known Covid 19? NO . Have you been around anyone who is awaiting Covid 19 test results in the past 7 to 10 days? NO . Have you been around anyone who has been exposed to Covid 19, or has mentioned symptoms of Covid 19 within the past 7 to 10 days? NO  If you have any concerns/questions about symptoms patients report during screening (either on the phone or at threshold). Contact the provider seeing the patient or DOD for further guidance.  If neither are available contact a member of the leadership team.

## 2019-03-01 ENCOUNTER — Ambulatory Visit (INDEPENDENT_AMBULATORY_CARE_PROVIDER_SITE_OTHER): Payer: Medicare Other | Admitting: Cardiology

## 2019-03-01 ENCOUNTER — Other Ambulatory Visit: Payer: Self-pay

## 2019-03-01 ENCOUNTER — Encounter: Payer: Self-pay | Admitting: Cardiology

## 2019-03-01 VITALS — BP 140/80 | HR 70 | Ht 70.0 in | Wt 199.0 lb

## 2019-03-01 DIAGNOSIS — I484 Atypical atrial flutter: Secondary | ICD-10-CM

## 2019-03-01 NOTE — Patient Instructions (Signed)
Medication Instructions:    Your physician recommends that you continue on your current medications as directed. Please refer to the Current Medication list given to you today.  - If you need a refill on your cardiac medications before your next appointment, please call your pharmacy.   Labwork:  None ordered  Testing/Procedures:  None ordered  Follow-Up:  Your physician recommends that you schedule a follow-up appointment in: 3 months with Dr. Camnitz.  Thank you for choosing [facility]!!   Elyssa Pendelton, RN [phone]         

## 2019-03-01 NOTE — Progress Notes (Signed)
Electrophysiology Office Note   Date:  03/01/2019   ID:  [name], [name] 1940/10/15, MRN [id number]  PCP:  [name], NP  Cardiologist:  Harrington Challenger Primary Electrophysiologist:  Jovie Swanner [name], MD    No chief complaint on file.    History of Present Illness: Brent Wilkins is a 77 y.o. male who is being seen today for the evaluation of atrial fibrillation at the request of [name], NP. Presenting today for electrophysiology evaluation. He has a history of hypertension, paroxysmal atrial fibrillation, and hyperlipidemia. He was seen in the hospital total at the beginning of April for syncope. The patient was bradycardic and thus not on any AV nodal blockers. At the time of hospitalization, he was found to be orthostatic.  Due to his atrial fibrillation, he was admitted to the hospital for Tikosyn administration.  He tolerated the dose well and was discharged on 500 mcg.  Today, denies symptoms of palpitations, chest pain, shortness of breath, orthopnea, PND, lower extremity edema, claudication, dizziness, presyncope, syncope, bleeding, or neurologic sequela. The patient is tolerating medications without difficulties.  He is having some weakness and fatigue and is noted that his heart rates get down into the 40s rarely.  Unfortunately he is going in and out of atrial fibrillation and atrial flutter.  He is minimally symptomatic from this.    Past Medical History:  Diagnosis Date  . Allergy    hay fever;  seasonal  . Arthritis    right ankle  . Cataract    surgery 2016  . Chronic kidney disease    stones many years ago  . Dizziness 12/13/2015  . Dysphagia   . Frequent urination at night   . GERD (gastroesophageal reflux disease)   . History of kidney stones   . HTN (hypertension)   . Hypothyroidism   . 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 Neuropathy    Bilateral ankles  . Paroxysmal atrial fibrillation (HCC)    a. on Tikosyn and Eliquis  . Polyneuropathy  06/16/2017  . Prostate cancer [facility]) 2010   Dr. Rosana Hoes  . Schatzki's ring 10/22/2011  . Seasonal allergies   . Syncope  dx. 8 yrs ago   Past Surgical History:  Procedure Laterality Date  . APPENDECTOMY    . CATARACT EXTRACTION W/PHACO Left 03/27/2015   Procedure: CATARACT EXTRACTION PHACO AND INTRAOCULAR LENS PLACEMENT LEFT EYE CDE=11.73;  Surgeon: Tonny Branch, MD;  Location: [facility];  Service: Ophthalmology;  Laterality: Left;  . CATARACT EXTRACTION W/PHACO Right 03/30/2015   Procedure: CATARACT EXTRACTION PHACO AND INTRAOCULAR LENS PLACEMENT RIGHT EYE CDE=7.35;  Surgeon: Tonny Branch, MD;  Location: [facility];  Service: Ophthalmology;  Laterality: Right;  . CHOLECYSTECTOMY    . COLONOSCOPY  07/23/10   Dr. Vivi Ferns rectum, scattered pancolonic diverticula  . COLONOSCOPY  08/10/1999   internal hemorrhoids,inflammatory polyp  . ESOPHAGOGASTRODUODENOSCOPY  04/12/08   Prominent Schatzki's ring, erosive reflux esophagitis, multiple antral erosions, small hiatal hernia, reactive gastropathy, status post dilation with 51F  . ESOPHAGOGASTRODUODENOSCOPY  10/28/2011   Procedure: ESOPHAGOGASTRODUODENOSCOPY (EGD);  Surgeon: Daneil Dolin, MD;  [facility];  Service: Endoscopy;  Laterality: N/A;  1:30  . ESOPHAGOGASTRODUODENOSCOPY N/A 09/10/2017   Dr. Gala Romney: Schatkzi's ring s/p dilation at GE junction, mild esophageal reflux esophagitis, medium sized hiatal hernia, normal duodenum. 56 and 64 Fr dilation  . EYE SURGERY    . MALONEY DILATION  10/28/2011   Procedure: MALONEY DILATION;  Surgeon:  Daneil Dolin, MD;  [facility];  Service: Endoscopy;  Laterality: N/A;  . Venia Minks DILATION N/A 09/10/2017   Procedure: Venia Minks DILATION;  Surgeon: Daneil Dolin, MD;  [facility];  Service: Endoscopy;  Laterality: N/A;  . SAVORY DILATION  10/28/2011   Procedure: SAVORY DILATION;  Surgeon: Daneil Dolin, MD;  [facility];  Service: Endoscopy;  Laterality: N/A;  .  TONSILLECTOMY    . TOTAL ANKLE ARTHROPLASTY Left 11/25/2013   Procedure: TOTAL ANKLE ARTHOPLASTY;  Surgeon: Wylene Simmer, MD;  Location: [place];  Service: Orthopedics;  Laterality: Left;  . TOTAL ANKLE ARTHROPLASTY Right 07/14/2014   dr hewitt  . TOTAL ANKLE ARTHROPLASTY Right 07/14/2014   Procedure: RIGHT TOTAL ANKLE ARTHOPLASTY;  Surgeon: Wylene Simmer, MD;  Location: [place];  Service: Orthopedics;  Laterality: Right;     Current Outpatient Medications  Medication Sig Dispense Refill  . amLODipine (NORVASC) 5 MG tablet TAKE 1 TABLET BY MOUTH  DAILY 90 tablet 3  . celecoxib (CELEBREX) 200 MG capsule Take 200 mg by mouth daily as needed for mild pain.     . diphenhydramine-acetaminophen (TYLENOL PM) 25-500 MG TABS tablet Take 1 tablet by mouth at bedtime as needed. Takes for sleep and mild discomfort    . docusate sodium (COLACE) 100 MG capsule Take 100 mg by mouth as needed for mild constipation.    . dofetilide (TIKOSYN) 500 MCG capsule Take 1 capsule (500 mcg total) by mouth 2 (two) times daily. 180 capsule 3  . ELIQUIS 5 MG TABS tablet TAKE 1 TABLET BY MOUTH TWO  TIMES DAILY 180 tablet 3  . finasteride (PROSCAR) 5 MG tablet Take 5 mg by mouth daily.    . fluticasone (FLONASE) 50 MCG/ACT nasal spray Place 1 spray into both nostrils daily as needed for allergies. 48 g 3  . gabapentin (NEURONTIN) 300 MG capsule Take 300 mg by mouth 2 (two) times daily.     Marland Kitchen ibuprofen (ADVIL,MOTRIN) 200 MG tablet Take 200 mg by mouth every 6 (six) hours as needed for headache. 200-400 mg as needed for HA    . levothyroxine (SYNTHROID, LEVOTHROID) 75 MCG tablet Take 1 tablet (75 mcg total) by mouth daily. 90 tablet 3  . loratadine (CLARITIN) 10 MG tablet Take 10 mg by mouth daily as needed for allergies.    Marland Kitchen losartan (COZAAR) 50 MG tablet TAKE 1 TABLET BY MOUTH  DAILY 90 tablet 3  . magnesium citrate SOLN Take 1 Bottle by mouth once.    . Omega-3 Fatty Acids (RA FISH OIL) 1400 MG CPDR Take 1,400 mg by mouth  daily.    . pantoprazole (PROTONIX) 40 MG tablet Take 1 tablet (40 mg total) by mouth daily before breakfast. 90 tablet 3  . Polyethyl Glycol-Propyl Glycol (SYSTANE) 0.4-0.3 % SOLN Place 1 drop into both eyes 2 (two) times daily as needed (dry eyes).     . polyethylene glycol (MIRALAX / GLYCOLAX) 17 g packet Take 17 g by mouth daily.    . Tamsulosin HCl (FLOMAX) 0.4 MG CAPS Take 0.4 mg by mouth daily after breakfast.     . Turmeric 500 MG CAPS Take 500 mg by mouth daily.    . vitamin B-12 (CYANOCOBALAMIN) 500 MCG tablet Take 1,000 mcg by mouth daily.      No current facility-administered medications for this visit.     Allergies:   Tetanus toxoids, Statins, Dicyclomine, Sulfamethoxazole, Doxycycline, Oxycodone, Penicillins, Sulfa antibiotics, and Xarelto [rivaroxaban]  Social History:  The patient  reports that he has never smoked. He has never used smokeless tobacco. He reports current alcohol use of about 2.0 standard drinks of alcohol per week. He reports that he does not use drugs.   Family History:  The patient's family history includes ALS in his son; Arthritis in his mother; Cancer in his father; Heart disease in his mother; Hypertension in his father; Prostate cancer in his brother and father; Prostate cancer (age of onset: 25) in his son.   ROS:  Please see the history of present illness.   Otherwise, review of systems is positive for none.   All other systems are reviewed and negative.   PHYSICAL EXAM: VS:  BP 140/80   Pulse 70   Ht 5\' 10"  (1.778 m)   Wt 199 lb (90.3 kg)   SpO2 97%   BMI 28.55 kg/m  , BMI Body mass index is 28.55 kg/m. GEN: Well nourished, well developed, in no acute distress  HEENT: normal  Neck: no JVD, carotid bruits, or masses Cardiac: iRRR; no murmurs, rubs, or gallops,no edema  Respiratory:  clear to auscultation bilaterally, normal work of breathing GI: soft, nontender, nondistended, + BS MS: no deformity or atrophy  Skin: warm and dry Neuro:   Strength and sensation are intact Psych: euthymic mood, full affect  EKG:  EKG is ordered today. Personal review of the ekg ordered shows atrial flutter, rate 70    Recent Labs: 04/02/2018: ALT 9; TSH 3.50 06/23/2018: Magnesium 2.1 12/07/2018: BUN 17; Creatinine, Ser 1.32; Hemoglobin 16.4; Platelets 250; Potassium 4.2; Sodium 143    Lipid Panel     Component Value Date/Time   CHOL 179 04/02/2018 0924   TRIG 181 (H) 04/02/2018 0924   HDL 45 04/02/2018 0924   CHOLHDL 4.0 04/02/2018 0924   VLDL 30 09/22/2017 1444   LDLCALC 104 (H) 04/02/2018 0924     Wt Readings from Last 3 Encounters:  03/01/19 199 lb (90.3 kg)  12/31/18 193 lb (87.5 kg)  12/21/18 195 lb (88.5 kg)      Other studies Reviewed: Additional studies/ records that were reviewed today include: 30 day monitor 04/28/17 - personally reviewed  Review of the above records today demonstrates:   Atrial fibrillation and flutter seen throughout study.  Multiple pauses up to 3 seconds with most occurring during early morning hours (presumably asleep) and one occurring at approximately 1 pm.  TTE 03/21/17 - Left ventricle: The cavity size was normal. Wall thickness was   increased in a pattern of moderate LVH. Systolic function was   normal. The estimated ejection fraction was in the range of 60%   to 65%. Wall motion was normal; there were no regional wall   motion abnormalities. Doppler parameters are consistent with   restrictive physiology, indicative of decreased left ventricular   diastolic compliance and/or increased left atrial pressure.   Doppler parameters are consistent with high ventricular filling   pressure. - Aortic valve: Moderately calcified annulus. Trileaflet. - Mitral valve: Mildly calcified annulus. - Left atrium: The atrium was severely dilated. - Right ventricle: Systolic function was mildly reduced. - Right atrium: The atrium was mildly to moderately dilated. - Tricuspid valve: There was  mild-moderate regurgitation. - Pulmonary arteries: Incomplete TR jet to accurately estimated   PASP.  ASSESSMENT AND PLAN:  1.  Atrial fibrillation/atrial flutter: Currently on Eliquis and dofetilide.  He has had further episodes of atrial fibrillation and atrial flutter.  He would like to see how  he does over the next few months prior to any changes.  I did discuss with him the possibility of switching to amiodarone versus ablation.   This patients CHA2DS2-VASc Score and unadjusted Ischemic Stroke Rate (% per year) is equal to 3.2 % stroke rate/year from a score of 3  Above score calculated as 1 point each if present [CHF, HTN, DM, Vascular=MI/PAD/Aortic Plaque, Age if 65-74, or Male] Above score calculated as 2 points each if present [Age > 75, or Stroke/TIA/TE]  2. Syncope: No further episodes.  No changes..   3. Hypertension: Currently well controlled   Current medicines are reviewed at length with the patient today.   The patient does not have concerns regarding his medicines.  The following changes were made today: None  Labs/ tests ordered today include:  Orders Placed This Encounter  Procedures  . EKG 12-Lead     Disposition:   FU with Ariyona Eid 3 months  Signed, Kerigan Narvaez [name], MD  03/01/2019 4:58 PM     [place] [address] [place] [id number] [phone] (office) [phone] (fax)

## 2019-03-03 ENCOUNTER — Encounter: Payer: Self-pay | Admitting: *Deleted

## 2019-03-03 ENCOUNTER — Other Ambulatory Visit: Payer: Self-pay

## 2019-03-03 ENCOUNTER — Ambulatory Visit: Payer: Medicare Other | Admitting: Gastroenterology

## 2019-03-03 ENCOUNTER — Encounter: Payer: Self-pay | Admitting: Gastroenterology

## 2019-03-03 VITALS — BP 179/90 | HR 59 | Temp 97.3°F | Ht 69.0 in | Wt 199.8 lb

## 2019-03-03 DIAGNOSIS — R1310 Dysphagia, unspecified: Secondary | ICD-10-CM

## 2019-03-03 NOTE — Assessment & Plan Note (Signed)
78 year old male with known esophagitis, history of Schaztki's ring s/p dilation in Feb 2019, medium-sized hiatal hernia, without solid food dysphagia but noting liquid dysphagia specifically with cold water. Several incidences of epigastric pain/non-cardiac chest pain over past few years, with patient concerned about hiatal hernia. Pursuing BPE to assess for possible underlying motility and further evaluate known hiatal hernia.

## 2019-03-03 NOTE — Patient Instructions (Signed)
We have arranged a swallow study for more evaluation.   Continue Protonix once daily.  Further recommendations to follow!  I enjoyed seeing you again today! As you know, I value our relationship and want to provide genuine, compassionate, and quality care. I welcome your feedback. If you receive a survey regarding your visit,  I greatly appreciate you taking time to fill this out. See you next time!  Annitta Needs, PhD, ANP-BC [facility]

## 2019-03-03 NOTE — Progress Notes (Signed)
Referring Provider: [name], NP Primary Care Physician:  [name], NP  Chief Complaint  Patient presents with  . Follow-up    ? about hiatal hernia    HPI:   Brent Wilkins is a 78 y.o. male presenting today with a history of constipation/obstipation after taking Bentyl in May 2020. After Miralax purge, he had good results.   Constipation: resolved.   Hiatal hernia: expressed concern for this at time of televisit in May 2020. Last EGD Feb 2019 with medium-sized hiatal hernia, Schatzki's ring s/p dilation, reflux esophagitis. Has had two episodes over the past few years with epigastric pain/chest pain. Takes an antacid and helps relieve.  Hard to swallow cold liquids. No solid food dysphagia. No abdominal pain with eating. Protonix once daily. No symptomatic reflux. Concerned about hiatal hernia.   Past Medical History:  Diagnosis Date  . Allergy    hay fever;  seasonal  . Arthritis    right ankle  . Cataract    surgery 2016  . Chronic kidney disease    stones many years ago  . Dizziness 12/13/2015  . Dysphagia   . Frequent urination at night   . GERD (gastroesophageal reflux disease)   . History of kidney stones   . HTN (hypertension)   . Hypothyroidism   . 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 Neuropathy    Bilateral ankles  . Paroxysmal atrial fibrillation (HCC)    a. on Tikosyn and Eliquis  . Polyneuropathy 06/16/2017  . Prostate cancer [facility]) 2010   Dr. Rosana Hoes  . Schatzki's ring 10/22/2011  . Seasonal allergies   . Syncope  dx. 8 yrs ago    Past Surgical History:  Procedure Laterality Date  . APPENDECTOMY    . CATARACT EXTRACTION W/PHACO Left 03/27/2015   Procedure: CATARACT EXTRACTION PHACO AND INTRAOCULAR LENS PLACEMENT LEFT EYE CDE=11.73;  Surgeon: Tonny Branch, MD;  [facility]: [facility];  Service: Ophthalmology;  Laterality: Left;  . CATARACT EXTRACTION W/PHACO Right 03/30/2015   Procedure: CATARACT EXTRACTION PHACO AND INTRAOCULAR LENS PLACEMENT  RIGHT EYE CDE=7.35;  Surgeon: Tonny Branch, MD;  Location: [facility];  Service: Ophthalmology;  Laterality: Right;  . CHOLECYSTECTOMY    . COLONOSCOPY  07/23/10   Dr. Vivi Ferns rectum, scattered pancolonic diverticula  . COLONOSCOPY  08/10/1999   internal hemorrhoids,inflammatory polyp  . ESOPHAGOGASTRODUODENOSCOPY  04/12/08   Prominent Schatzki's ring, erosive reflux esophagitis, multiple antral erosions, small hiatal hernia, reactive gastropathy, status post dilation with 57F  . ESOPHAGOGASTRODUODENOSCOPY  10/28/2011   Procedure: ESOPHAGOGASTRODUODENOSCOPY (EGD);  Surgeon: Daneil Dolin, MD;  [facility];  Service: Endoscopy;  Laterality: N/A;  1:30  . ESOPHAGOGASTRODUODENOSCOPY N/A 09/10/2017   Dr. Gala Romney: Schatkzi's ring s/p dilation at GE junction, mild esophageal reflux esophagitis, medium sized hiatal hernia, normal duodenum. 56 and 76 Fr dilation  . EYE SURGERY    . MALONEY DILATION  10/28/2011   Procedure: Venia Minks DILATION;  Surgeon: Daneil Dolin, MD;  [facility];  Service: Endoscopy;  Laterality: N/A;  . Venia Minks DILATION N/A 09/10/2017   Procedure: Venia Minks DILATION;  Surgeon: Daneil Dolin, MD;  [facility];  Service: Endoscopy;  Laterality: N/A;  . SAVORY DILATION  10/28/2011   Procedure: SAVORY DILATION;  Surgeon: Daneil Dolin, MD;  [facility];  Service: Endoscopy;  Laterality: N/A;  . TONSILLECTOMY    . TOTAL ANKLE ARTHROPLASTY Left 11/25/2013   Procedure: TOTAL ANKLE ARTHOPLASTY;  Surgeon: Wylene Simmer, MD;  Location: [place];  Service: Orthopedics;  Laterality: Left;  . TOTAL ANKLE ARTHROPLASTY Right 07/14/2014   dr hewitt  . TOTAL ANKLE ARTHROPLASTY Right 07/14/2014   Procedure: RIGHT TOTAL ANKLE ARTHOPLASTY;  Surgeon: Wylene Simmer, MD;  Location: [place];  Service: Orthopedics;  Laterality: Right;    Current Outpatient Medications  Medication Sig Dispense Refill  . amLODipine (NORVASC) 5 MG tablet TAKE 1 TABLET BY MOUTH  DAILY 90  tablet 3  . celecoxib (CELEBREX) 200 MG capsule Take 200 mg by mouth daily as needed for mild pain.     . diphenhydramine-acetaminophen (TYLENOL PM) 25-500 MG TABS tablet Take 1 tablet by mouth at bedtime as needed. Takes for sleep and mild discomfort    . docusate sodium (COLACE) 100 MG capsule Take 100 mg by mouth as needed for mild constipation.    . dofetilide (TIKOSYN) 500 MCG capsule Take 1 capsule (500 mcg total) by mouth 2 (two) times daily. 180 capsule 3  . ELIQUIS 5 MG TABS tablet TAKE 1 TABLET BY MOUTH TWO  TIMES DAILY 180 tablet 3  . finasteride (PROSCAR) 5 MG tablet Take 5 mg by mouth daily.    . fluticasone (FLONASE) 50 MCG/ACT nasal spray Place 1 spray into both nostrils daily as needed for allergies. 48 g 3  . gabapentin (NEURONTIN) 300 MG capsule Take 300 mg by mouth 2 (two) times daily.     Marland Kitchen ibuprofen (ADVIL,MOTRIN) 200 MG tablet Take 200 mg by mouth every 6 (six) hours as needed for headache. 200-400 mg as needed for HA    . levothyroxine (SYNTHROID, LEVOTHROID) 75 MCG tablet Take 1 tablet (75 mcg total) by mouth daily. 90 tablet 3  . loratadine (CLARITIN) 10 MG tablet Take 10 mg by mouth daily as needed for allergies.    Marland Kitchen losartan (COZAAR) 50 MG tablet TAKE 1 TABLET BY MOUTH  DAILY 90 tablet 3  . magnesium citrate SOLN Take 1 Bottle by mouth once.    . Omega-3 Fatty Acids (RA FISH OIL) 1400 MG CPDR Take 1,400 mg by mouth daily.    . pantoprazole (PROTONIX) 40 MG tablet Take 1 tablet (40 mg total) by mouth daily before breakfast. 90 tablet 3  . Polyethyl Glycol-Propyl Glycol (SYSTANE) 0.4-0.3 % SOLN Place 1 drop into both eyes 2 (two) times daily as needed (dry eyes).     . polyethylene glycol (MIRALAX / GLYCOLAX) 17 g packet Take 17 g by mouth daily.    . Tamsulosin HCl (FLOMAX) 0.4 MG CAPS Take 0.4 mg by mouth daily after breakfast.     . Turmeric 500 MG CAPS Take 500 mg by mouth daily.    . vitamin B-12 (CYANOCOBALAMIN) 500 MCG tablet Take 1,000 mcg by mouth daily.       No current facility-administered medications for this visit.     Allergies as of 03/03/2019 - Review Complete 03/01/2019  Allergen Reaction Noted  . Tetanus toxoids Swelling 05/10/2013  . Statins Other (See Comments) 10/12/2012  . Dicyclomine Other (See Comments) 12/21/2018  . Sulfamethoxazole  09/26/2011  . Doxycycline Rash 09/30/2011  . Oxycodone Other (See Comments) 07/06/2014  . Penicillins Swelling and Rash 09/26/2011  . Sulfa antibiotics Swelling and Rash 10/22/2011  . Xarelto [rivaroxaban] Rash 06/16/2017    Family History  Problem Relation Age of Onset  . Prostate cancer Father   . Cancer Father        prostate to bone  . Hypertension Father   .  Prostate cancer Son 83  . ALS Son   . Prostate cancer Brother   . Heart disease Mother        died at 1  . Arthritis Mother   . Colon cancer Neg Hx     Social History   Socioeconomic History  . Marital status: Married    Spouse name: Candace  . Number of children: 2  . Years of education: Not on file  . Highest education level: Bachelor's degree (e.g., BA, AB, BS)  Occupational History  . Occupation: retired; Ambulance person: RETIRED  Social Needs  . Financial resource strain: Not hard at all  . Food insecurity    Worry: Never true    Inability: Never true  . Transportation needs    Medical: No    Non-medical: No  Tobacco Use  . Smoking status: Never Smoker  . Smokeless tobacco: Never Used  Substance and Sexual Activity  . Alcohol use: Yes    Alcohol/week: 2.0 standard drinks    Types: 1 Glasses of wine, 1 Cans of beer per week    Comment: glass of wine or "couple of beers" occasionally   . Drug use: No  . Sexual activity: Yes    Partners: Female  Lifestyle  . Physical activity    Days per week: 5 days    Minutes per session: 30 min  . Stress: Not at all  Relationships  . Social connections    Talks on phone: More than three times a week    Gets together: More than three times a  week    Attends religious service: More than 4 times per year    Active member of club or organization: No    Attends meetings of clubs or organizations: Never    Relationship status: Married  Other Topics Concern  . Not on file  Social History Narrative   Lives with wife Candace.       Moved from [place], [place]   Retired from [employer].   Plays golf.   Works with [employer] and [employer] for [employer].   Wears seatbelt.   Drives.   Eats all food groups.   Never smoked.   Married for over 52 years June  (2020)   Has two grown children, live in [place] and [place].    Review of Systems: Gen: Denies fever, chills, anorexia. Denies fatigue, weakness, weight loss.  CV: Denies chest pain, palpitations, syncope, peripheral edema, and claudication. Resp: Denies dyspnea at rest, cough, wheezing, coughing up blood, and pleurisy. GI: see HPI Derm: Denies rash, itching, dry skin Psych: Denies depression, anxiety, memory loss, confusion. No homicidal or suicidal ideation.  Heme: Denies bruising, bleeding, and enlarged lymph nodes.  Physical Exam: BP (!) 179/90   Pulse (!) 59   Temp (!) 97.3 F (36.3 C) (Temporal)   Ht 5\' 9"  (1.753 m)   Wt 199 lb 12.8 oz (90.6 kg)   BMI 29.51 kg/m  General:   Alert and oriented. No distress noted. Pleasant and cooperative.  Head:  Normocephalic and atraumatic. Eyes:  Conjuctiva clear without scleral icterus. Mouth:  Oral mucosa pink and moist.  Abdomen:  +BS, soft, non-tender and non-distended. No rebound or guarding. No HSM or masses noted. Msk:  Symmetrical without gross deformities. Normal posture. Extremities:  Without edema. Neurologic:  Alert and  oriented x4 Psych:  Alert and cooperative. Normal mood and affect.

## 2019-03-04 ENCOUNTER — Ambulatory Visit: Payer: Medicare Other | Admitting: Gastroenterology

## 2019-03-04 NOTE — Progress Notes (Signed)
CC'D TO PCP °

## 2019-03-05 ENCOUNTER — Ambulatory Visit (HOSPITAL_COMMUNITY)
Admission: RE | Admit: 2019-03-05 | Discharge: 2019-03-05 | Disposition: A | Discharge status: Home / Self Care | Payer: Medicare Other | Source: Ambulatory Visit | Attending: Gastroenterology | Admitting: Gastroenterology

## 2019-03-05 ENCOUNTER — Other Ambulatory Visit: Payer: Self-pay

## 2019-03-05 DIAGNOSIS — R1310 Dysphagia, unspecified: Secondary | ICD-10-CM | POA: Insufficient documentation

## 2019-03-05 DIAGNOSIS — K224 Dyskinesia of esophagus: Secondary | ICD-10-CM | POA: Diagnosis not present

## 2019-03-10 ENCOUNTER — Encounter: Payer: Self-pay | Admitting: Internal Medicine

## 2019-03-10 NOTE — Progress Notes (Signed)
Esophageal motility disorder. He does not have normal primary peristalsis and severe tertiary, or irregular contractions. He has a large hiatal hernia. Would stick with chewing well, sitting upright, avoiding tough textures. He could undergo manometry if worsening swallowing issues, but currently this is only with water. Let's see him back in 6 months.

## 2019-03-12 ENCOUNTER — Encounter: Payer: Self-pay | Admitting: Internal Medicine

## 2019-03-12 NOTE — Progress Notes (Signed)
PATIENT SCHEDULED AND LETTER SENT  °

## 2019-04-13 DIAGNOSIS — L570 Actinic keratosis: Secondary | ICD-10-CM | POA: Diagnosis not present

## 2019-04-13 DIAGNOSIS — X32XXXD Exposure to sunlight, subsequent encounter: Secondary | ICD-10-CM | POA: Diagnosis not present

## 2019-04-13 DIAGNOSIS — D225 Melanocytic nevi of trunk: Secondary | ICD-10-CM | POA: Diagnosis not present

## 2019-04-21 ENCOUNTER — Telehealth: Payer: Self-pay | Admitting: Cardiovascular Disease

## 2019-04-21 NOTE — Telephone Encounter (Signed)
Patient calling to check status of Brent Wilkins patient assistance form. / tg

## 2019-04-23 NOTE — Telephone Encounter (Signed)
Pt to bring pt assistance forms by office 04/23/19. Forms to be completed on Tues and faxed to [facility].

## 2019-04-27 ENCOUNTER — Encounter (INDEPENDENT_AMBULATORY_CARE_PROVIDER_SITE_OTHER): Payer: Self-pay

## 2019-04-27 ENCOUNTER — Other Ambulatory Visit: Payer: Self-pay

## 2019-04-27 ENCOUNTER — Encounter: Payer: Medicare Other | Admitting: Family Medicine

## 2019-04-27 ENCOUNTER — Ambulatory Visit (INDEPENDENT_AMBULATORY_CARE_PROVIDER_SITE_OTHER): Payer: Medicare Other | Admitting: Family Medicine

## 2019-04-27 ENCOUNTER — Encounter: Payer: Self-pay | Admitting: Family Medicine

## 2019-04-27 VITALS — BP 120/86 | HR 69 | Temp 98.1°F | Resp 14 | Ht 70.0 in | Wt 199.1 lb

## 2019-04-27 DIAGNOSIS — Z23 Encounter for immunization: Secondary | ICD-10-CM | POA: Diagnosis not present

## 2019-04-27 DIAGNOSIS — E785 Hyperlipidemia, unspecified: Secondary | ICD-10-CM | POA: Diagnosis not present

## 2019-04-27 DIAGNOSIS — E663 Overweight: Secondary | ICD-10-CM

## 2019-04-27 DIAGNOSIS — I4819 Other persistent atrial fibrillation: Secondary | ICD-10-CM | POA: Diagnosis not present

## 2019-04-27 DIAGNOSIS — I10 Essential (primary) hypertension: Secondary | ICD-10-CM

## 2019-04-27 DIAGNOSIS — R739 Hyperglycemia, unspecified: Secondary | ICD-10-CM

## 2019-04-27 DIAGNOSIS — Z0000 Encounter for general adult medical examination without abnormal findings: Secondary | ICD-10-CM

## 2019-04-27 DIAGNOSIS — T7840XA Allergy, unspecified, initial encounter: Secondary | ICD-10-CM

## 2019-04-27 NOTE — Progress Notes (Signed)
Brent Wilkins is a 78 y.o. male who presents for annual wellness visit and follow-up on chronic medical conditions.  He has the following concerns:  Tremor has appeared to be a little bit worse.  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  He reports that he might of been on a beta-blocker at one time he is unsure  Would need to check with his cardiologist before starting anything.  Has had some increasing sinus headache.  Reports ibuprofen helps this.  He knows that he can take a lot of ibuprofen.  Reports not taking his Claritin regularly.  Her Flonase is set for at night.  Denies any recent contacts that have been sick.  Denies any other signs of symptoms of sinus infection, ear infection or COVID.  Past Medical, Surgical, Social History, Allergies, and Medications have been Reviewed.  Immunization History  Administered Date(s) Administered  . Fluad Quad(high Dose 65+) 04/27/2019  . Influenza, High Dose Seasonal PF 05/07/2017, 06/12/2018  . Pneumococcal Conjugate-13 06/16/2017  . Pneumococcal Polysaccharide-23 01/02/2018  . Zoster Recombinat (Shingrix) 04/07/2018, 09/22/2018   Last colonoscopy: Cleared to not have, does have GI on board Last PSA: has had drawn recently- Brent Wilkins ([facility]?). Had check, needs to get results cause it was 40. Dentist: 2020 Ophtho: 2020 Exercise: Walking daily with dog   Depression screen:  See scanned questionnaire.  Notable  ADL screen:  See scanned questionnaire.  Notable   End of Life Discussion:  Patient does not have a living will and medical power of attorney Has the paperwork.  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     PHYSICAL EXAM:  BP 120/86   Pulse 69   Temp 98.1 F (36.7 C) (Oral)   Resp 14   Ht 5\' 10"  (1.778 m)   Wt 199 lb 1.9 oz (90.3 kg)   SpO2 98%   BMI 28.57 kg/m   Physical Exam Vitals signs and nursing note reviewed.  Constitutional:      Appearance: Normal appearance. He is well-developed, well-groomed and overweight.  HENT:     Head: Normocephalic and atraumatic.     Right Ear: Tympanic membrane, ear canal and external ear normal.     Left Ear: Tympanic membrane, ear canal and external ear normal.     Ears:     Weber exam findings: does not lateralize.    Right Rinne: AC > BC.    Left Rinne: AC > BC.    Nose: Nose normal. No nasal deformity or septal deviation.     Right Turbinates: Not enlarged.     Left Turbinates: Not enlarged.     Right Sinus: No maxillary sinus tenderness or frontal sinus tenderness.     Left Sinus: No maxillary sinus tenderness or frontal sinus tenderness.     Mouth/Throat:     Lips: Pink. No lesions.     Mouth: Mucous membranes are moist.     Dentition: Normal dentition. Does not have dentures. No dental tenderness.     Tongue: No lesions. Tongue does not deviate from midline.  Palate: No mass and lesions.     Pharynx: Oropharynx is clear. Uvula midline. No pharyngeal swelling or posterior oropharyngeal erythema.     Tonsils: No tonsillar exudate or tonsillar abscesses.  Eyes:     General: Lids are normal. No allergic shiner or visual field deficit.       Right eye: No discharge.        Left eye: No discharge.     Extraocular Movements: Extraocular movements intact.     Conjunctiva/sclera: Conjunctivae normal.     Pupils: Pupils are equal, round, and reactive to  light.  Neck:     Musculoskeletal: Normal range of motion and neck supple.     Thyroid: No thyroid mass, thyromegaly or thyroid tenderness.     Trachea: Trachea normal.  Cardiovascular:     Rate and Rhythm: Normal rate and regular rhythm.     Pulses: Normal pulses.          Radial pulses are 2+ on the right side and 2+ on the left side.       Dorsalis pedis pulses are 2+ on the right side and 2+ on the left side.       Posterior tibial pulses are 2+ on the right side and 2+ on the left side.     Heart sounds: Normal heart sounds.  Pulmonary:     Effort: Pulmonary effort is normal.     Breath sounds: Normal breath sounds.  Abdominal:     General: Bowel sounds are normal. There is no distension.     Palpations: Abdomen is soft.     Tenderness: There is no abdominal tenderness.  Musculoskeletal: Normal range of motion.     Right lower leg: No edema.     Left lower leg: No edema.  Lymphadenopathy:     Cervical: No cervical adenopathy.     Upper Body:     Right upper body: No supraclavicular adenopathy.     Left upper body: No supraclavicular adenopathy.  Skin:    General: Skin is warm and dry.     Capillary Refill: Capillary refill takes less than 2 seconds.  Neurological:     General: No focal deficit present.     Mental Status: He is alert and oriented to person, place, and time.     Cranial Nerves: Cranial nerves are intact. No cranial nerve deficit, dysarthria or facial asymmetry.     Sensory: Sensation is intact.     Motor: Weakness and tremor present. No atrophy, abnormal muscle tone, seizure activity or pronator drift.     Coordination: Coordination is intact.     Gait: Gait is intact.     Deep Tendon Reflexes: Reflexes are normal and symmetric.     Comments: Tremor-related to spinal meningitis as kid  Psychiatric:        Attention and Perception: Attention normal.        Mood and Affect: Mood and affect normal.        Speech: Speech normal.        Behavior: Behavior  normal. Behavior is cooperative.        Thought Content: Thought content normal.        Cognition and Memory: Cognition and memory normal.        Judgment: Judgment normal.      ASSESSMENT/PLAN:  1. Annual physical exam Discussed PSA screening-is followed with this as he does have prostate cancer, recommended at least 30 minutes of aerobic activity at least 5 days/week;  proper sunscreen use reviewed; healthy diet and alcohol recommendations (less than or equal to 2 drinks/day) reviewed; regular seatbelt use; changing batteries in smoke detectors. Immunization recommendations discussed.  No longer needs colonoscopy.  2. Need for immunization against influenza Patient was educated on the recommendation for flu vaccine. After obtaining informed consent, the vaccine was administered no adverse effects noted at time of administration. Patient provided with education on arm soreness and use of tylenol or ibuprofen (if safe) for this. Encourage to use the arm vaccine was given in to help reduce the soreness. Patient educated on the signs of a reaction to the vaccine and advised to contact the office should these occur.   - Flu Vaccine QUAD High Dose(Fluad)  3. Other persistent atrial fibrillation Is followed by cardiology.  Advised to continue all medications as they have prescribed.  Will check thyroid. Please do not hesitate to reach out appreciate collaboration in his care.  - TSH  4. Overweight (BMI 25.0-29.9) Unchanged  Brent Wilkins is re-educated about the importance of exercise daily to help with weight management. A minumum of 30 minutes daily is recommended. Additionally, importance of healthy food choices  with portion control discussed. He has done a good job at maintain weight.  Needs up dated labs  Wt Readings from Last 3 Encounters:  04/27/19 199 lb 1.9 oz (90.3 kg)  03/03/19 199 lb 12.8 oz (90.6 kg)  03/01/19 199 lb (90.3 kg)    - Lipid panel  5. Hyperlipidemia LDL  goal <100 Advised to continue heart healthy, low-fat diet.  Needs updated lipid  - Lipid panel  6. Hyperglycemia Needs a check of A1c as previous history of hypoglycemia.  - Hemoglobin A1c  7. Essential hypertension Brent Wilkins is encouraged to maintain a well balanced diet that is low in salt. Controlled, continue current medication regimen. No refills needed. He reports continued walking with his beloved dog.  And wife at times.  Given weight and overall health advised to continue to do this as this is been very beneficial for him.  - CBC - COMPLETE METABOLIC PANEL WITH GFR   8. Allergic state, initial encounter Encouraged to restart his Claritin and Flonase.    Medicare Attestation I have personally reviewed: The patient's medical and social history Their use of alcohol, tobacco or illicit drugs Their current medications and supplements The patient's functional ability including ADLs,fall risks, home safety risks, cognitive, and hearing and visual impairment Diet and physical activities Evidence for depression or mood disorders  The patient's weight, height, BMI, and visual acuity have been recorded in the chart.  I have made referrals, counseling, and provided education to the patient based on review of the above and I have provided the patient with a written personalized care plan for preventive services.    Follow Up: 6 months or PRN  [name], NP   04/27/2019

## 2019-04-27 NOTE — Patient Instructions (Addendum)
Thank you for coming into the office today. I appreciate the opportunity to provide you with the care for your health and wellness.  Follow Up: 6 months   No labs today. I have placed labs for you to get 1 week before next visit.  Please continue to practice social distancing to keep you, your family, and our community safe.  If you must go out, please wear a Mask and practice good handwashing.  Have a wonderful day and week. With Gratitude, Cherly Beach, DNP, AGNP-BC  HEALTH MAINTENANCE RECOMMENDATIONS:  It is recommended that you get at least 30 minutes of aerobic exercise at least 5 days/week (for weight loss, you may need as much as 60-90 minutes). This can be any activity that gets your heart rate up. This can be divided in 10-15 minute intervals if needed, but try and build up your endurance at least once a week.  Weight bearing exercise is also recommended twice weekly.  Eat a healthy diet with lots of vegetables, fruits and fiber.  "Colorful" foods have a lot of vitamins (ie green vegetables, tomatoes, red peppers, etc).  Limit sweet tea, regular sodas and alcoholic beverages, all of which has a lot of calories and sugar.  Up to 2 alcoholic drinks daily may be beneficial for men (unless trying to lose weight, watch sugars).  Drink a lot of water.  Sunscreen of at least SPF 30 should be used on all sun-exposed parts of the skin when outside between the hours of 10 am and 4 pm (not just when at beach or pool, but even with exercise, golf, tennis, and yard work!)  Use a sunscreen that says "broad spectrum" so it covers both UVA and UVB rays, and make sure to reapply every 1-2 hours.  Remember to change the batteries in your smoke detectors when changing your clock times in the spring and fall.  Use your seat belt every time you are in a car, and please drive safely and not be distracted with cell phones and texting while driving.  GENERAL RECOMMENDATIONS FOR GOOD HEALTH:   Healthy diet: Eat a variety of foods, including fruits, vegetables, vegetable protein such as beans, lentils, tofu, and grains, such as rice.  Limit meat or animal protein, but if you eat meat, choose leans cuts such as chicken, fish, or [place].  Drink plenty of water daily.  Decrease saturated fat in the diet, avoid lots of red meat, processed foods, sweets, fast foods, and fried foods.  Limit salt and caffeine intake.  Exercise: Aerobic exercise helps maintain good heart health. Weight bearing exercise helps keep bones and muscles working strong.  We recommend at least 30-40 minutes of exercise most days of the week.   Fall prevention: Falls are the leading cause of injuries, accidents, and accidental deaths in people over the age of 4. Falling is a real threat to your ability to live on your own.  Causes include poor eyesight or poor hearing, illness, poor lighting, throw rugs, clutter in your home, and medication side effects causing dizziness or balance problems.  Such medications can include medications for depression, sleep problems, high blood pressure, diabetes, and heart conditions.   PREVENTION  Be sure your home is as safe as possible. Here are some tips:  Wear shoes with non-skid soles (not house slippers).   Be sure your home and outside area are well lit.   Use night lights throughout your house, including hallways and stairways.   Remove clutter and  clean up spills on floors and walkways.   Remove throw rugs or fasten them to the floor with carpet tape. Tack down carpet edges.   Do not place electrical cords across pathways.   Install grab bars in your bathtub, shower, and toilet area. Towel bars should not be used as a grab bar.   Install handrails on both sides of stairways.   Do not climb on stools or stepladders. Get someone else to help with jobs that require climbing.   Do not wax your floors at all, or use a non-skid wax.   Repair uneven or unsafe sidewalks,  walkways or stairs.   Keep frequently used items within reach.   Be aware of pets so you do not trip.  Get regular check-ups from your doctor, and take good care of yourself:  Have your eyes checked every year for vision changes, cataracts, glaucoma, and other eye problems. Wear eyeglasses as directed.   Have your hearing checked every 2 years, or anytime you or others think that you cannot hear well. Use hearing aids as directed.   See your caregiver if you have foot pain or corns. Sore feet can contribute to falls.   Let your caregiver know if a medicine is making you feel dizzy or making you lose your balance.   Use a cane, walker, or wheelchair as directed. Use walker or wheelchair brakes when getting in and out.   When you get up from bed, sit on the side of the bed for 1 to 2 minutes before you stand up. This will give your blood pressure time to adjust, and you will feel less dizzy.   If you need to go to the bathroom often, consider using a bedside commode.

## 2019-05-03 ENCOUNTER — Telehealth: Payer: Self-pay | Admitting: *Deleted

## 2019-05-03 NOTE — Telephone Encounter (Signed)
Pt notified that he does not meet the income requirements for the BMSPAF. Case # [id number]

## 2019-06-08 ENCOUNTER — Encounter: Payer: Self-pay | Admitting: Cardiology

## 2019-06-08 ENCOUNTER — Other Ambulatory Visit: Payer: Self-pay

## 2019-06-08 ENCOUNTER — Ambulatory Visit: Payer: Medicare Other | Admitting: Cardiology

## 2019-06-08 ENCOUNTER — Encounter (INDEPENDENT_AMBULATORY_CARE_PROVIDER_SITE_OTHER): Payer: Self-pay

## 2019-06-08 VITALS — BP 140/80 | HR 63 | Ht 70.0 in | Wt 199.4 lb

## 2019-06-08 DIAGNOSIS — Z79899 Other long term (current) drug therapy: Secondary | ICD-10-CM

## 2019-06-08 DIAGNOSIS — I4819 Other persistent atrial fibrillation: Secondary | ICD-10-CM

## 2019-06-08 LAB — MAGNESIUM: Magnesium: 2 mg/dL (ref 1.6–2.3)

## 2019-06-08 NOTE — Progress Notes (Signed)
Electrophysiology Office Note   Date:  06/08/2019   ID:  Brent, Wilkins [birth year], MRN [id number]  PCP:  [name], NP  Cardiologist:  Harrington Challenger Primary Electrophysiologist:  Laurance Heide [name], MD    No chief complaint on file.    History of Present Illness: Brent Wilkins is a 78 y.o. male who is being seen today for the evaluation of atrial fibrillation at the request of [name], NP. Presenting today for electrophysiology evaluation. He has a history of hypertension, paroxysmal atrial fibrillation, and hyperlipidemia. He was seen in the hospital total at the beginning of April for syncope. The patient was bradycardic and thus not on any AV nodal blockers. At the time of hospitalization, he was found to be orthostatic.  Due to his atrial fibrillation, he was admitted to the hospital for Tikosyn administration.  He tolerated the dose well and was discharged on 500 mcg.  Today, denies symptoms of palpitations, chest pain, shortness of breath, orthopnea, PND, lower extremity edema, claudication, dizziness, presyncope, syncope, bleeding, or neurologic sequela. The patient is tolerating medications without difficulties.  Continues to have episodic palpitations that occur a few times a month.  He is potentially interested in ablation.  He says his palpitations make him feel weak and fatigued.  At times when he takes his blood pressure and heart rate, his heart rate goes into the high 40s.  Past Medical History:  Diagnosis Date  . Allergy    hay fever;  seasonal  . Arthritis    right ankle  . Cataract    surgery 2016  . Chronic kidney disease    stones many years ago  . Dizziness 12/13/2015  . Dysphagia   . Frequent urination at night   . GERD (gastroesophageal reflux disease)   . History of kidney stones   . HTN (hypertension)   . Hypothyroidism   . 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 Neuropathy    Bilateral ankles  . Paroxysmal atrial fibrillation (HCC)    a.  on Tikosyn and Eliquis  . Polyneuropathy 06/16/2017  . Prostate cancer [facility]) 2010   Dr. Rosana Hoes  . Schatzki's ring 10/22/2011  . Seasonal allergies   . Syncope  dx. 8 yrs ago   Past Surgical History:  Procedure Laterality Date  . APPENDECTOMY    . CATARACT EXTRACTION W/PHACO Left 03/27/2015   Procedure: CATARACT EXTRACTION PHACO AND INTRAOCULAR LENS PLACEMENT LEFT EYE CDE=11.73;  Surgeon: Tonny Branch, MD;  Location: [facility];  Service: Ophthalmology;  Laterality: Left;  . CATARACT EXTRACTION W/PHACO Right 03/30/2015   Procedure: CATARACT EXTRACTION PHACO AND INTRAOCULAR LENS PLACEMENT RIGHT EYE CDE=7.35;  Surgeon: Tonny Branch, MD;  Location: [facility];  Service: Ophthalmology;  Laterality: Right;  . CHOLECYSTECTOMY    . COLONOSCOPY  07/23/10   Dr. Vivi Ferns rectum, scattered pancolonic diverticula  . COLONOSCOPY  08/10/1999   internal hemorrhoids,inflammatory polyp  . ESOPHAGOGASTRODUODENOSCOPY  04/12/08   Prominent Schatzki's ring, erosive reflux esophagitis, multiple antral erosions, small hiatal hernia, reactive gastropathy, status post dilation with 84F  . ESOPHAGOGASTRODUODENOSCOPY  10/28/2011   Procedure: ESOPHAGOGASTRODUODENOSCOPY (EGD);  Surgeon: Daneil Dolin, MD;  [facility];  Service: Endoscopy;  Laterality: N/A;  1:30  . ESOPHAGOGASTRODUODENOSCOPY N/A 09/10/2017   Dr. Gala Romney: Schatkzi's ring s/p dilation at GE junction, mild esophageal reflux esophagitis, medium sized hiatal hernia, normal duodenum. 56 and 67 Fr dilation  . EYE SURGERY    . MALONEY DILATION  10/28/2011  Procedure: MALONEY DILATION;  Surgeon: Daneil Dolin, MD;  [facility];  Service: Endoscopy;  Laterality: N/A;  . Venia Minks DILATION N/A 09/10/2017   Procedure: Venia Minks DILATION;  Surgeon: Daneil Dolin, MD;  [facility];  Service: Endoscopy;  Laterality: N/A;  . SAVORY DILATION  10/28/2011   Procedure: SAVORY DILATION;  Surgeon: Daneil Dolin, MD;  [facility];  Service:  Endoscopy;  Laterality: N/A;  . TONSILLECTOMY    . TOTAL ANKLE ARTHROPLASTY Left 11/25/2013   Procedure: TOTAL ANKLE ARTHOPLASTY;  Surgeon: Wylene Simmer, MD;  Location: [place];  Service: Orthopedics;  Laterality: Left;  . TOTAL ANKLE ARTHROPLASTY Right 07/14/2014   dr hewitt  . TOTAL ANKLE ARTHROPLASTY Right 07/14/2014   Procedure: RIGHT TOTAL ANKLE ARTHOPLASTY;  Surgeon: Wylene Simmer, MD;  Location: [place];  Service: Orthopedics;  Laterality: Right;     Current Outpatient Medications  Medication Sig Dispense Refill  . amLODipine (NORVASC) 5 MG tablet TAKE 1 TABLET BY MOUTH  DAILY 90 tablet 3  . celecoxib (CELEBREX) 200 MG capsule Take 200 mg by mouth daily as needed for mild pain.     . diphenhydramine-acetaminophen (TYLENOL PM) 25-500 MG TABS tablet Take 1 tablet by mouth at bedtime as needed. Takes for sleep and mild discomfort    . docusate sodium (COLACE) 100 MG capsule Take 100 mg by mouth as needed for mild constipation.    . dofetilide (TIKOSYN) 500 MCG capsule Take 1 capsule (500 mcg total) by mouth 2 (two) times daily. 180 capsule 3  . ELIQUIS 5 MG TABS tablet TAKE 1 TABLET BY MOUTH TWO  TIMES DAILY 180 tablet 3  . finasteride (PROSCAR) 5 MG tablet Take 5 mg by mouth daily.    . fluticasone (FLONASE) 50 MCG/ACT nasal spray Place 1 spray into both nostrils daily as needed for allergies. 48 g 3  . gabapentin (NEURONTIN) 300 MG capsule Take 300 mg by mouth 2 (two) times daily.     Marland Kitchen ibuprofen (ADVIL,MOTRIN) 200 MG tablet Take 200 mg by mouth every 6 (six) hours as needed for headache. 200-400 mg as needed for HA    . levothyroxine (SYNTHROID, LEVOTHROID) 75 MCG tablet Take 1 tablet (75 mcg total) by mouth daily. 90 tablet 3  . loratadine (CLARITIN) 10 MG tablet Take 10 mg by mouth daily as needed for allergies.    Marland Kitchen losartan (COZAAR) 50 MG tablet TAKE 1 TABLET BY MOUTH  DAILY 90 tablet 3  . Omega-3 Fatty Acids (RA FISH OIL) 1400 MG CPDR Take 1,400 mg by mouth daily.    . pantoprazole  (PROTONIX) 40 MG tablet Take 1 tablet (40 mg total) by mouth daily before breakfast. 90 tablet 3  . Polyethyl Glycol-Propyl Glycol (SYSTANE) 0.4-0.3 % SOLN Place 1 drop into both eyes 2 (two) times daily as needed (dry eyes).     . polyethylene glycol (MIRALAX / GLYCOLAX) 17 g packet Take 17 g by mouth daily.    . Tamsulosin HCl (FLOMAX) 0.4 MG CAPS Take 0.4 mg by mouth daily after breakfast.     . Turmeric 500 MG CAPS Take 500 mg by mouth daily.    . vitamin B-12 (CYANOCOBALAMIN) 500 MCG tablet Take 1,000 mcg by mouth daily.      No current facility-administered medications for this visit.     Allergies:   Tetanus toxoids, Statins, Dicyclomine, Sulfamethoxazole, Doxycycline, Oxycodone, Penicillins, Sulfa antibiotics, and Xarelto [rivaroxaban]   Social History:  The patient  reports  that he has never smoked. He has never used smokeless tobacco. He reports current alcohol use of about 2.0 standard drinks of alcohol per week. He reports that he does not use drugs.   Family History:  The patient's family history includes ALS in his son; Arthritis in his mother; Cancer in his father; Heart disease in his mother; Hypertension in his father; Prostate cancer in his brother and father; Prostate cancer (age of onset: 41) in his son.   ROS:  Please see the history of present illness.   Otherwise, review of systems is positive for none.   All other systems are reviewed and negative.   PHYSICAL EXAM: VS:  BP 140/80   Pulse 63   Ht 5\' 10"  (1.778 m)   Wt 199 lb 6.4 oz (90.4 kg)   SpO2 97%   BMI 28.61 kg/m  , BMI Body mass index is 28.61 kg/m. GEN: Well nourished, well developed, in no acute distress  HEENT: normal  Neck: no JVD, carotid bruits, or masses Cardiac: RRR; no murmurs, rubs, or gallops,no edema  Respiratory:  clear to auscultation bilaterally, normal work of breathing GI: soft, nontender, nondistended, + BS MS: no deformity or atrophy  Skin: warm and dry Neuro:  Strength and sensation  are intact Psych: euthymic mood, full affect  EKG:  EKG is ordered today. Personal review of the ekg ordered shows sinus rhythm, right bundle branch block, QTC 468 ms, heart rate 63  Recent Labs: 06/23/2018: Magnesium 2.1 12/07/2018: BUN 17; Creatinine, Ser 1.32; Hemoglobin 16.4; Platelets 250; Potassium 4.2; Sodium 143    Lipid Panel     Component Value Date/Time   CHOL 179 04/02/2018 0924   TRIG 181 (H) 04/02/2018 0924   HDL 45 04/02/2018 0924   CHOLHDL 4.0 04/02/2018 0924   VLDL 30 09/22/2017 1444   LDLCALC 104 (H) 04/02/2018 0924     Wt Readings from Last 3 Encounters:  06/08/19 199 lb 6.4 oz (90.4 kg)  04/27/19 199 lb 1.9 oz (90.3 kg)  03/03/19 199 lb 12.8 oz (90.6 kg)      Other studies Reviewed: Additional studies/ records that were reviewed today include: 30 day monitor 04/28/17 - personally reviewed  Review of the above records today demonstrates:   Atrial fibrillation and flutter seen throughout study.  Multiple pauses up to 3 seconds with most occurring during early morning hours (presumably asleep) and one occurring at approximately 1 pm.  TTE 03/21/17 - Left ventricle: The cavity size was normal. Wall thickness was   increased in a pattern of moderate LVH. Systolic function was   normal. The estimated ejection fraction was in the range of 60%   to 65%. Wall motion was normal; there were no regional wall   motion abnormalities. Doppler parameters are consistent with   restrictive physiology, indicative of decreased left ventricular   diastolic compliance and/or increased left atrial pressure.   Doppler parameters are consistent with high ventricular filling   pressure. - Aortic valve: Moderately calcified annulus. Trileaflet. - Mitral valve: Mildly calcified annulus. - Left atrium: The atrium was severely dilated. - Right ventricle: Systolic function was mildly reduced. - Right atrium: The atrium was mildly to moderately dilated. - Tricuspid valve: There  was mild-moderate regurgitation. - Pulmonary arteries: Incomplete TR jet to accurately estimated   PASP.  ASSESSMENT AND PLAN:  1.  Atrial fibrillation/atrial flutter: Currently on Eliquis and dofetilide.  He is potentially interested in A. fib ablation, but he would prefer to continue to watch  and wait at this time.  We Tyshika Baldridge see him back in 3 months for further discussions.  This patients CHA2DS2-VASc Score and unadjusted Ischemic Stroke Rate (% per year) is equal to 3.2 % stroke rate/year from a score of 3  Above score calculated as 1 point each if present [CHF, HTN, DM, Vascular=MI/PAD/Aortic Plaque, Age if 65-74, or Male] Above score calculated as 2 points each if present [Age > 75, or Stroke/TIA/TE]   2. Syncope: Continues to have episodes.  Episodes appear to be orthostatic in nature.  I have told him to increase hydration.   3. Hypertension: Elevated today but has recently been normal.  No changes.   Current medicines are reviewed at length with the patient today.   The patient does not have concerns regarding his medicines.  The following changes were made today: None  Labs/ tests ordered today include:  Orders Placed This Encounter  Procedures  . Magnesium  . EKG 12-Lead     Disposition:   FU with Zadyn Yardley 3 months  Signed, Milayna Rotenberg [name], MD  06/08/2019 12:07 PM     [facility] [address] [place] [address] [phone] (office) [phone] (fax)

## 2019-06-08 NOTE — Patient Instructions (Addendum)
Medication Instructions:  Your physician recommends that you continue on your current medications as directed. Please refer to the Current Medication list given to you today.  * If you need a refill on your cardiac medications before your next appointment, please call your pharmacy.   Labwork: Today: Magnesium level If you have labs (blood work) drawn today and your tests are completely normal, you will receive your results only by:  [place] (if you have MyChart) OR  A paper copy in the mail If you have any lab test that is abnormal or we need to change your treatment, we will call you to review the results.  Testing/Procedures: None ordered  Follow-Up: You are scheduled for a 3 month follow up with Dr. Curt Bears on 09/13/2019 @ 10:30 am  Thank you for choosing [facility]!!   Trinidad Curet, RN [phone]

## 2019-06-15 ENCOUNTER — Encounter: Payer: Self-pay | Admitting: Family Medicine

## 2019-06-15 ENCOUNTER — Ambulatory Visit (INDEPENDENT_AMBULATORY_CARE_PROVIDER_SITE_OTHER): Payer: Medicare Other | Admitting: Family Medicine

## 2019-06-15 ENCOUNTER — Other Ambulatory Visit: Payer: Self-pay

## 2019-06-15 VITALS — BP 120/86 | HR 60 | Ht 70.0 in | Wt 192.0 lb

## 2019-06-15 DIAGNOSIS — I10 Essential (primary) hypertension: Secondary | ICD-10-CM

## 2019-06-15 DIAGNOSIS — J0111 Acute recurrent frontal sinusitis: Secondary | ICD-10-CM | POA: Diagnosis not present

## 2019-06-15 MED ORDER — LEVOFLOXACIN 500 MG PO TABS: 500.0000 mg | ORAL_TABLET | Freq: Every day | ORAL | 0 refills | Status: AC

## 2019-06-15 NOTE — Progress Notes (Signed)
rocephin     Virtual Visit via Telephone Note   This visit type was conducted due to national recommendations for restrictions regarding the COVID-19 Pandemic (e.g. social distancing) in an effort to limit this patient's exposure and mitigate transmission in our community.  Due to his co-morbid illnesses, this patient is at least at moderate risk for complications without adequate follow up.  This format is felt to be most appropriate for this patient at this time.  The patient did not have access to video technology/had technical difficulties with video requiring transitioning to audio format only (telephone).  All issues noted in this document were discussed and addressed.  No physical exam could be performed with this format.   Evaluation Performed:  Follow-up visit  Date:  06/15/2019   ID:  Brent Wilkins, Brent Wilkins [birth year], MRN [id number]  Patient Location: Home Provider Location: Office  Location of Patient: Home Location of Provider: Telehealth Consent was obtain for visit to be over via telehealth. I verified that I am speaking with the correct person using two identifiers.  PCP:  [name], NP   Chief Complaint: Ongoing sinus infection  History of Present Illness:    Brent Wilkins is a 78 y.o. male with history of hypertension, hypothyroidism, atrial fibrillation on Tikosyn and Eliquis, chronic kidney disease among others.  Presents today with ongoing headache behind his eyes for over 2 weeks.  Sinus infection that was seen on x-ray at dentist office secondary to dental infection.  Was treated with Z-Pak dental infection is better.  But he still having the signs and symptoms of sinus infection.  The patient does not have symptoms concerning for COVID-19 infection (fever, chills, cough, or new shortness of breath).   Past Medical, Surgical, Social History, Allergies, and Medications have been Reviewed.  Past Medical History:  Diagnosis Date  . Allergy    hay fever;   seasonal  . Arthritis    right ankle  . Cataract    surgery 2016  . Chronic kidney disease    stones many years ago  . Dizziness 12/13/2015  . Dysphagia   . Frequent urination at night   . GERD (gastroesophageal reflux disease)   . History of kidney stones   . HTN (hypertension)   . Hypothyroidism   . 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 Neuropathy    Bilateral ankles  . Paroxysmal atrial fibrillation (HCC)    a. on Tikosyn and Eliquis  . Polyneuropathy 06/16/2017  . Prostate cancer [facility]) 2010   Dr. Rosana Hoes  . Schatzki's ring 10/22/2011  . Seasonal allergies   . Syncope  dx. 8 yrs ago   Past Surgical History:  Procedure Laterality Date  . APPENDECTOMY    . CATARACT EXTRACTION W/PHACO Left 03/27/2015   Procedure: CATARACT EXTRACTION PHACO AND INTRAOCULAR LENS PLACEMENT LEFT EYE CDE=11.73;  Surgeon: Tonny Branch, MD;  [facility];  Service: Ophthalmology;  Laterality: Left;  . CATARACT EXTRACTION W/PHACO Right 03/30/2015   Procedure: CATARACT EXTRACTION PHACO AND INTRAOCULAR LENS PLACEMENT RIGHT EYE CDE=7.35;  Surgeon: Tonny Branch, MD;  Location: [facility];  Service: Ophthalmology;  Laterality: Right;  . CHOLECYSTECTOMY    . COLONOSCOPY  07/23/10   Dr. Vivi Ferns rectum, scattered pancolonic diverticula  . COLONOSCOPY  08/10/1999   internal hemorrhoids,inflammatory polyp  . ESOPHAGOGASTRODUODENOSCOPY  04/12/08   Prominent Schatzki's ring, erosive reflux esophagitis, multiple antral erosions, small hiatal hernia, reactive gastropathy, status post dilation with 33F  . ESOPHAGOGASTRODUODENOSCOPY  10/28/2011  Procedure: ESOPHAGOGASTRODUODENOSCOPY (EGD);  Surgeon: Daneil Dolin, MD;  [facility];  Service: Endoscopy;  Laterality: N/A;  1:30  . ESOPHAGOGASTRODUODENOSCOPY N/A 09/10/2017   Dr. Gala Romney: Schatkzi's ring s/p dilation at GE junction, mild esophageal reflux esophagitis, medium sized hiatal hernia, normal duodenum. 56 and 42 Fr dilation  . EYE SURGERY    . MALONEY  DILATION  10/28/2011   Procedure: Venia Minks DILATION;  Surgeon: Daneil Dolin, MD;  [facility];  Service: Endoscopy;  Laterality: N/A;  . Venia Minks DILATION N/A 09/10/2017   Procedure: Venia Minks DILATION;  Surgeon: Daneil Dolin, MD;  [facility];  Service: Endoscopy;  Laterality: N/A;  . SAVORY DILATION  10/28/2011   Procedure: SAVORY DILATION;  Surgeon: Daneil Dolin, MD;  [facility];  Service: Endoscopy;  Laterality: N/A;  . TONSILLECTOMY    . TOTAL ANKLE ARTHROPLASTY Left 11/25/2013   Procedure: TOTAL ANKLE ARTHOPLASTY;  Surgeon: Wylene Simmer, MD;  Location: [facility];  Service: Orthopedics;  Laterality: Left;  . TOTAL ANKLE ARTHROPLASTY Right 07/14/2014   dr hewitt  . TOTAL ANKLE ARTHROPLASTY Right 07/14/2014   Procedure: RIGHT TOTAL ANKLE ARTHOPLASTY;  Surgeon: Wylene Simmer, MD;  Location: [facility];  Service: Orthopedics;  Laterality: Right;     Current Meds  Medication Sig  . amLODipine (NORVASC) 5 MG tablet TAKE 1 TABLET BY MOUTH  DAILY  . celecoxib (CELEBREX) 200 MG capsule Take 200 mg by mouth daily as needed for mild pain.   . diphenhydramine-acetaminophen (TYLENOL PM) 25-500 MG TABS tablet Take 1 tablet by mouth at bedtime as needed. Takes for sleep and mild discomfort  . docusate sodium (COLACE) 100 MG capsule Take 100 mg by mouth as needed for mild constipation.  . dofetilide (TIKOSYN) 500 MCG capsule Take 1 capsule (500 mcg total) by mouth 2 (two) times daily.  Marland Kitchen ELIQUIS 5 MG TABS tablet TAKE 1 TABLET BY MOUTH TWO  TIMES DAILY  . finasteride (PROSCAR) 5 MG tablet Take 5 mg by mouth daily.  . fluticasone (FLONASE) 50 MCG/ACT nasal spray Place 1 spray into both nostrils daily as needed for allergies.  Marland Kitchen gabapentin (NEURONTIN) 300 MG capsule Take 300 mg by mouth 2 (two) times daily.   Marland Kitchen ibuprofen (ADVIL,MOTRIN) 200 MG tablet Take 200 mg by mouth every 6 (six) hours as needed for headache. 200-400 mg as needed for HA  . levothyroxine (SYNTHROID, LEVOTHROID)  75 MCG tablet Take 1 tablet (75 mcg total) by mouth daily.  Marland Kitchen loratadine (CLARITIN) 10 MG tablet Take 10 mg by mouth daily as needed for allergies.  Marland Kitchen losartan (COZAAR) 50 MG tablet TAKE 1 TABLET BY MOUTH  DAILY  . Omega-3 Fatty Acids (RA FISH OIL) 1400 MG CPDR Take 1,400 mg by mouth daily.  . pantoprazole (PROTONIX) 40 MG tablet Take 1 tablet (40 mg total) by mouth daily before breakfast.  . Polyethyl Glycol-Propyl Glycol (SYSTANE) 0.4-0.3 % SOLN Place 1 drop into both eyes 2 (two) times daily as needed (dry eyes).   . polyethylene glycol (MIRALAX / GLYCOLAX) 17 g packet Take 17 g by mouth daily.  . Tamsulosin HCl (FLOMAX) 0.4 MG CAPS Take 0.4 mg by mouth daily after breakfast.   . Turmeric 500 MG CAPS Take 500 mg by mouth daily.  . vitamin B-12 (CYANOCOBALAMIN) 500 MCG tablet Take 1,000 mcg by mouth daily.      Allergies:   Tetanus toxoids, Statins, Dicyclomine, Sulfamethoxazole, Doxycycline, Oxycodone, Penicillins, Sulfa antibiotics, and Xarelto [rivaroxaban]  Social History   Tobacco Use  . Smoking status: Never Smoker  . Smokeless tobacco: Never Used  Substance Use Topics  . Alcohol use: Yes    Alcohol/week: 2.0 standard drinks    Types: 1 Glasses of wine, 1 Cans of beer per week    Comment: glass of wine or "couple of beers" occasionally   . Drug use: No     Family Hx: The patient's family history includes ALS in his son; Arthritis in his mother; Cancer in his father; Heart disease in his mother; Hypertension in his father; Prostate cancer in his brother and father; Prostate cancer (age of onset: 71) in his son. There is no history of Colon cancer.  ROS:   Please see the history of present illness.    All other systems reviewed and are negative.   Labs/Other Tests and Data Reviewed:    Recent Labs: 12/07/2018: BUN 17; Creatinine, Ser 1.32; Hemoglobin 16.4; Platelets 250; Potassium 4.2; Sodium 143 06/08/2019: Magnesium 2.0   Recent Lipid Panel Lab Results  Component  Value Date/Time   CHOL 179 04/02/2018 09:24 AM   TRIG 181 (H) 04/02/2018 09:24 AM   HDL 45 04/02/2018 09:24 AM   CHOLHDL 4.0 04/02/2018 09:24 AM   LDLCALC 104 (H) 04/02/2018 09:24 AM    Wt Readings from Last 3 Encounters:  06/15/19 192 lb (87.1 kg)  06/08/19 199 lb 6.4 oz (90.4 kg)  04/27/19 199 lb 1.9 oz (90.3 kg)     Objective:    Vital Signs:  BP 120/86   Pulse (!) 59   Ht 5\' 10"  (1.778 m)   Wt 192 lb (87.1 kg)   BMI 27.55 kg/m    VITAL SIGNS:  reviewed GEN:  Alert and oriented RESPIRATORY:  No shortness of breath noted in conversation PSYCH:  Normal affect and mood, good communication and pleasant  ASSESSMENT & PLAN:     1. Acute recurrent frontal sinusitis Continued sinus infection, was seen on x-ray by dentist treated with Z-Pak no improvement in sinus symptoms with some improvement in dental infection.  Due to recent use of antibiotics will prescribe Levaquin.  There is a interaction with Tikosyn for possible prolonged QTC, given the allergy history and recent use of antibiotics Levaquin is still the best medication to prescribe. Review side effects with pt. Patient acknowledged agreement and understanding of the plan.    - levofloxacin (LEVAQUIN) 500 MG tablet; Take 1 tablet (500 mg total) by mouth daily for 5 days.  Dispense: 5 tablet; Refill: 0  2. HTN, goal below 140/90 Controlled, continue current medications.   Time:   Today, I have spent 10 minutes with the patient with telehealth technology discussing the above problems.     Medication Adjustments/Labs and Tests Ordered: Current medicines are reviewed at length with the patient today.  Concerns regarding medicines are outlined above.   Tests Ordered: No orders of the defined types were placed in this encounter.   Medication Changes: Meds ordered this encounter  Medications  . levofloxacin (LEVAQUIN) 500 MG tablet    Sig: Take 1 tablet (500 mg total) by mouth daily for 5 days.    Dispense:  5  tablet    Refill:  0    Order Specific Question:   Supervising Provider    Answer:   Fayrene Helper [id number]    Disposition:  Follow up 10/26/2019   Signed, [name], NP  06/15/2019 4:31 PM     [facility]

## 2019-06-15 NOTE — Patient Instructions (Addendum)
  I appreciate the opportunity to provide you with care for your health and wellness. Today we discussed: sinus infection  Follow up: 10/26/2019 as scheduled  No labs or referrals today  I have sent in Honor antibiotic for you to take, please take as directed. There is a interaction with this and your Afib medication, so please call if you have concerns or trouble. The interaction is related to QT elongation.  I hope you have a wonderful, happy, safe, and healthy Holiday Season! See you in the New Year :)  Please continue to practice social distancing to keep you, your family, and our community safe.  If you must go out, please wear a mask and practice good handwashing.  It was a pleasure to see you and I look forward to continuing to work together on your health and well-being. Please do not hesitate to call the office if you need care or have questions about your care.  Have a wonderful day and week. With Gratitude, Cherly Beach, DNP, AGNP-BC

## 2019-06-23 ENCOUNTER — Other Ambulatory Visit: Payer: Self-pay | Admitting: Cardiology

## 2019-06-23 NOTE — Telephone Encounter (Signed)
Eliquis 5mg  refill request received. Pt is 78yrs old, weight-87.1kg, Crea-1.32 on 12/07/2018, Diagnosis-Afib, and last seen by Dr. Curt Bears on 06/08/2019. Dose is appropriate based on dosing criteria. Will send in refill to requested pharmacy.

## 2019-07-05 ENCOUNTER — Other Ambulatory Visit: Payer: Self-pay

## 2019-07-05 MED ORDER — LEVOTHYROXINE SODIUM 75 MCG PO TABS: 75.0000 ug | ORAL_TABLET | Freq: Every day | ORAL | 1 refills | Status: DC

## 2019-07-07 ENCOUNTER — Telehealth: Payer: Self-pay | Admitting: *Deleted

## 2019-07-07 NOTE — Telephone Encounter (Signed)
Pt called said he was seen a few weeks ago and got something for his sinuses however he is no better and wanted to know if Jarrett Soho would call in another prescription.

## 2019-07-07 NOTE — Telephone Encounter (Signed)
Advised patient of treatment plan with verbal understanding 

## 2019-07-21 DIAGNOSIS — X32XXXD Exposure to sunlight, subsequent encounter: Secondary | ICD-10-CM | POA: Diagnosis not present

## 2019-07-21 DIAGNOSIS — L570 Actinic keratosis: Secondary | ICD-10-CM | POA: Diagnosis not present

## 2019-08-09 ENCOUNTER — Telehealth: Payer: Self-pay | Admitting: *Deleted

## 2019-08-09 MED ORDER — DOFETILIDE 500 MCG PO CAPS: 500.0000 ug | ORAL_CAPSULE | Freq: Two times a day (BID) | ORAL | 2 refills | Status: DC

## 2019-08-09 NOTE — Telephone Encounter (Signed)
Law, Dawne Carlis Abbott, RN  Hey,   Pt called our office b/c he unable to get through at [facility] on the phone. Pt forgot to send in for his Tikosyn mail order refill. So he needs it sent in locally but states he needs to give you some info regarding it.   Can you please call him [phone]? I did advise that it may be late this afternoon or early evening before you can call him.   Thanks!!

## 2019-08-09 NOTE — Telephone Encounter (Signed)
Spoke to pt. Refill sent to [facility] per pt request.

## 2019-09-01 ENCOUNTER — Other Ambulatory Visit: Payer: Self-pay | Admitting: Gastroenterology

## 2019-09-01 DIAGNOSIS — K2100 Gastro-esophageal reflux disease with esophagitis, without bleeding: Secondary | ICD-10-CM

## 2019-09-01 DIAGNOSIS — R1310 Dysphagia, unspecified: Secondary | ICD-10-CM

## 2019-09-13 ENCOUNTER — Ambulatory Visit: Payer: Medicare Other | Admitting: Cardiology

## 2019-09-13 ENCOUNTER — Encounter: Payer: Self-pay | Admitting: Cardiology

## 2019-09-13 ENCOUNTER — Other Ambulatory Visit: Payer: Self-pay

## 2019-09-13 VITALS — BP 132/82 | HR 86 | Ht 70.0 in | Wt 196.6 lb

## 2019-09-13 DIAGNOSIS — Z01812 Encounter for preprocedural laboratory examination: Secondary | ICD-10-CM | POA: Diagnosis not present

## 2019-09-13 DIAGNOSIS — I4819 Other persistent atrial fibrillation: Secondary | ICD-10-CM

## 2019-09-13 LAB — BASIC METABOLIC PANEL
BUN/Creatinine Ratio: 12 (ref 10–24)
BUN: 16 mg/dL (ref 8–27)
CO2: 21 mmol/L (ref 20–29)
Calcium: 8.9 mg/dL (ref 8.6–10.2)
Chloride: 103 mmol/L (ref 96–106)
Creatinine, Ser: 1.34 mg/dL — ABNORMAL HIGH (ref 0.76–1.27)
GFR calc Af Amer: 57 mL/min/{1.73_m2} — ABNORMAL LOW (ref >59)
GFR calc non Af Amer: 50 mL/min/{1.73_m2} — ABNORMAL LOW (ref >59)
Glucose: 151 mg/dL — ABNORMAL HIGH (ref 65–99)
Potassium: 4.2 mmol/L (ref 3.5–5.2)
Sodium: 142 mmol/L (ref 134–144)

## 2019-09-13 LAB — CBC
Hematocrit: 36.6 % — ABNORMAL LOW (ref 37.5–51.0)
Hemoglobin: 12.8 g/dL — ABNORMAL LOW (ref 13.0–17.7)
MCH: 30.1 pg (ref 26.6–33.0)
MCHC: 35 g/dL (ref 31.5–35.7)
MCV: 86 fL (ref 79–97)
Platelets: 379 10*3/uL (ref 150–450)
RBC: 4.25 x10E6/uL (ref 4.14–5.80)
RDW: 12.2 % (ref 11.6–15.4)
WBC: 15.3 10*3/uL — ABNORMAL HIGH (ref 3.4–10.8)

## 2019-09-13 NOTE — Patient Instructions (Addendum)
Medication Instructions:  Your physician recommends that you continue on your current medications as directed. Please refer to the Current Medication list given to you today.  *If you need a refill on your cardiac medications before your next appointment, please call your pharmacy*  Lab Work: Pre procedure labs today: BMET & CBC If you have labs (blood work) drawn today and your tests are completely normal, you will receive your results only by: Marland Kitchen MyChart Message (if you have MyChart) OR . A paper copy in the mail If you have any lab test that is abnormal or we need to change your treatment, we will call you to review the results.  Testing/Procedures: Your physician has requested that you have cardiac CT within 7 days prior to your ablation. Cardiac computed tomography (CT) is a painless test that uses an x-ray machine to take clear, detailed pictures of your heart. For further information please visit [email or web address]. Please follow instruction below located under special instructions. You will get a call from our office to schedule the date for this test.  Your physician has recommended that you have an ablation. Catheter ablation is a medical procedure used to treat some cardiac arrhythmias (irregular heartbeats). During catheter ablation, a long, thin, flexible tube is put into a blood vessel in your groin (upper thigh), or neck. This tube is called an ablation catheter. It is then guided to your heart through the blood vessel. Radio frequency waves destroy small areas of heart tissue where abnormal heartbeats may cause an arrhythmia to start. Please see the instructions below located under special instructions  Follow-Up: Your physician recommends that you schedule a follow-up appointment in: 4 weeks, after your procedure on 10/06/19, with Roderic Palau NP in the AFib clinic.  Your physician recommends that you schedule a follow up appointment in: 3 months, after your procedure on  10/06/19, with Dr. Curt Bears.   Thank you for choosing [facility]!! Trinidad Curet, RN [phone]   Any Other Special Instructions Will Be Listed Below    CT INSTRUCTIONS Your cardiac CT will be scheduled at:  [facility] [address] [place], [place] [address] [phone]  Please arrive at the [facility] main entrance of [facility] at ___________ on ___________. Please arrive 30-45 minutes prior to test start time. Proceed to the [facility] Radiology Department (first floor) to check-in and test prep.  Please follow these instructions carefully (unless otherwise directed):  Hold all erectile dysfunction medications at least 48 hours prior to test.  On the Night Before the Test: . Be sure to Drink plenty of water. . Do not consume any caffeinated/decaffeinated beverages or chocolate 12 hours prior to your test. . Do not take any antihistamines 12 hours prior to your test. . If the patient has contrast allergy: ? Patient will need a prescription for Prednisone and very clear instructions (as follows): 1. Prednisone 50 mg - take 13 hours prior to test 2. Take another Prednisone 50 mg 7 hours prior to test 3. Take another Prednisone 50 mg 1 hour prior to test 4. Take Benadryl 50 mg 1 hour prior to test . Patient must complete all four doses of above prophylactic medications. . Patient will need a ride after test due to Benadryl.  On the Day of the Test: . Drink plenty of water. Do not drink any water within one hour of the test. . Do not eat any food 4 hours prior to the test. . You may take your  regular medications prior to the test.  . Take metoprolol (Lopressor) two hours prior to test. . HOLD Furosemide/Hydrochlorothiazide morning of the test.       After the Test: . Drink plenty of water. . After receiving IV contrast, you may experience a mild flushed feeling. This is normal. . On occasion, you may experience a mild rash up to 24 hours  after the test. This is not dangerous. If this occurs, you can take Benadryl 25 mg and increase your fluid intake. . If you experience trouble breathing, this can be serious. If it is severe call 911 IMMEDIATELY. If it is mild, please call our office.   Please contact the cardiac imaging nurse navigator should you have any questions/concerns Marchia Bond, RN Navigator Cardiac Imaging [facility] [phone] Office  [phone] Cell       Electrophysiology/Ablation Procedure Instructions   You are scheduled for a(n)  ablation on 10/06/2019 with Dr. Allegra Lai.   1.   Pre procedure testing-             A.  LAB WORK --- On 09/13/19 for your pre procedure blood work.                 B. COVID TEST-- On 10/01/2019 @ 10:00 am  - You will go to [facility].  Be sure to share with the first checkpoint that you are there for pre-procedure/surgery testing.  Stay in your car and the nurse team will come to your car to test you.  After you are tested please go home and self quarantine until the day of your procedure.     2. On the day of your procedure 10/06/2019 you will go to [facility] [id number]) at 6:30 am.  Dennis Bast will go to the main entrance A [employer]) and enter where the [employer] are.  Your driver will drop you off and you will head down the hallway to ADMITTING.  You may have one support person come in to the hospital with you.  They will be asked to wait in the waiting room.   3.   Do not eat or drink after midnight prior to your procedure.   4.   Do NOT take any medications the morning of your procedure.   5.  Plan for an overnight stay.  If you use your phone frequently bring your phone charger.   6. You will follow up with the AFIB clinic 4 weeks after your procedure.  You will follow up with Dr. Curt Bears  3 months after your procedure.  These appointments will be made for you.   * If you have ANY questions please call  the office [phone] and ask for Morgann Woodburn RN or send me a MyChart message   * Occasionally, EP Studies and ablations can become lengthy.  Please make your family aware of this before your procedure starts.  Average time ranges from 2-8 hours for EP studies/ablations.  Your physician will call your family after the procedure with the results.                                    Cardiac Ablation Cardiac ablation is a procedure to disable (ablate) a small amount of heart tissue in very specific places. The heart has many electrical connections. Sometimes these connections are abnormal and can cause the  heart to beat very fast or irregularly. Ablating some of the problem areas can improve the heart rhythm or return it to normal. Ablation may be done for people who:  Have Wolff-Parkinson-White syndrome.  Have fast heart rhythms (tachycardia).  Have taken medicines for an abnormal heart rhythm (arrhythmia) that were not effective or caused side effects.  Have a high-risk heartbeat that may be life-threatening. During the procedure, a small incision is made in the neck or the groin, and a long, thin, flexible tube (catheter) is inserted into the incision and moved to the heart. Small devices (electrodes) on the tip of the catheter will send out electrical currents. A type of X-ray (fluoroscopy) will be used to help guide the catheter and to provide images of the heart. Tell a health care provider about:  Any allergies you have.  All medicines you are taking, including vitamins, herbs, eye drops, creams, and over-the-counter medicines.  Any problems you or family members have had with anesthetic medicines.  Any blood disorders you have.  Any surgeries you have had.  Any medical conditions you have, such as kidney failure.  Whether you are pregnant or may be pregnant. What are the risks? Generally, this is a safe procedure. However, problems may occur, including:  Infection.  Bruising  and bleeding at the catheter insertion site.  Bleeding into the chest, especially into the sac that surrounds the heart. This is a serious complication.  Stroke or blood clots.  Damage to other structures or organs.  Allergic reaction to medicines or dyes.  Need for a permanent pacemaker if the normal electrical system is damaged. A pacemaker is a small computer that sends electrical signals to the heart and helps your heart beat normally.  The procedure not being fully effective. This may not be recognized until months later. Repeat ablation procedures are sometimes required. What happens before the procedure?  Follow instructions from your health care provider about eating or drinking restrictions.  Ask your health care provider about: ? Changing or stopping your regular medicines. This is especially important if you are taking diabetes medicines or blood thinners. ? Taking medicines such as aspirin and ibuprofen. These medicines can thin your blood. Do not take these medicines before your procedure if your health care provider instructs you not to.  Plan to have someone take you home from the hospital or clinic.  If you will be going home right after the procedure, plan to have someone with you for 24 hours. What happens during the procedure?  To lower your risk of infection: ? Your health care team will wash or sanitize their hands. ? Your skin will be washed with soap. ? Hair may be removed from the incision area.  An IV tube will be inserted into one of your veins.  You will be given a medicine to help you relax (sedative).  The skin on your neck or groin will be numbed.  An incision will be made in your neck or your groin.  A needle will be inserted through the incision and into a large vein in your neck or groin.  A catheter will be inserted into the needle and moved to your heart.  Dye may be injected through the catheter to help your surgeon see the area of the  heart that needs treatment.  Electrical currents will be sent from the catheter to ablate heart tissue in desired areas. There are three types of energy that may be used to ablate heart tissue: ?  Heat (radiofrequency energy). ? Laser energy. ? Extreme cold (cryoablation).  When the necessary tissue has been ablated, the catheter will be removed.  Pressure will be held on the catheter insertion area to prevent excessive bleeding.  A bandage (dressing) will be placed over the catheter insertion area. The procedure may vary among health care providers and hospitals. What happens after the procedure?  Your blood pressure, heart rate, breathing rate, and blood oxygen level will be monitored until the medicines you were given have worn off.  Your catheter insertion area will be monitored for bleeding. You will need to lie still for a few hours to ensure that you do not bleed from the catheter insertion area.  Do not drive for 24 hours or as long as directed by your health care provider. Summary  Cardiac ablation is a procedure to disable (ablate) a small amount of heart tissue in very specific places. Ablating some of the problem areas can improve the heart rhythm or return it to normal.  During the procedure, electrical currents will be sent from the catheter to ablate heart tissue in desired areas. This information is not intended to replace advice given to you by your health care provider. Make sure you discuss any questions you have with your health care provider. Document Revised: 01/12/2018 Document Reviewed: 06/10/2016 Elsevier Patient Education  [place].

## 2019-09-13 NOTE — Progress Notes (Addendum)
Electrophysiology Office Note   Date:  09/13/2019   ID:  Brent Wilkins May 11, 1941, MRN [id number]  PCP:  [name], NP  Cardiologist:  Harrington Challenger Primary Electrophysiologist:  [name] Brent Leeds, MD    No chief complaint on file.    History of Present Illness: Brent Wilkins is a 79 y.o. male who is being seen today for the evaluation of atrial fibrillation at the request of [name], NP. Presenting today for electrophysiology evaluation. He has a history of hypertension, paroxysmal atrial fibrillation, and hyperlipidemia. He was seen in the hospital total at the beginning of April for syncope. The patient was bradycardic and thus not on any AV nodal blockers. At the time of hospitalization, he was found to be orthostatic.  Due to his atrial fibrillation, he was admitted to the hospital for Tikosyn administration.  He tolerated the dose well and was discharged on 500 mcg.  Today, denies symptoms of palpitations, chest pain, shortness of breath, orthopnea, PND, lower extremity edema, claudication, dizziness, presyncope, syncope, bleeding, or neurologic sequela. The patient is tolerating medications without difficulties.  He continues to feel bad in atrial fibrillation and atrial flutter.  It appears that the dofetilide is no longer keeping him in normal rhythm.  He is weak, and fatigue.  He has had a few near syncopal episodes, mainly in the morning.  Unfortunately he has gone into atrial flutter.  Past Medical History:  Diagnosis Date  . Allergy    hay fever;  seasonal  . Arthritis    right ankle  . Cataract    surgery 2016  . Chronic kidney disease    stones many years ago  . Dizziness 12/13/2015  . Dysphagia   . Frequent urination at night   . GERD (gastroesophageal reflux disease)   . History of kidney stones   . HTN (hypertension)   . Hypothyroidism   . 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 Neuropathy    Bilateral ankles  . Paroxysmal atrial fibrillation (HCC)     a. on Tikosyn and Eliquis  . Polyneuropathy 06/16/2017  . Prostate cancer [facility]) 2010   Dr. Rosana Hoes  . Schatzki's ring 10/22/2011  . Seasonal allergies   . Syncope  dx. 8 yrs ago   Past Surgical History:  Procedure Laterality Date  . APPENDECTOMY    . CATARACT EXTRACTION W/PHACO Left 03/27/2015   Procedure: CATARACT EXTRACTION PHACO AND INTRAOCULAR LENS PLACEMENT LEFT EYE CDE=11.73;  Surgeon: Tonny Branch, MD;  Location: [facility];  Service: Ophthalmology;  Laterality: Left;  . CATARACT EXTRACTION W/PHACO Right 03/30/2015   Procedure: CATARACT EXTRACTION PHACO AND INTRAOCULAR LENS PLACEMENT RIGHT EYE CDE=7.35;  Surgeon: Tonny Branch, MD;  Location: [facility];  Service: Ophthalmology;  Laterality: Right;  . CHOLECYSTECTOMY    . COLONOSCOPY  07/23/10   Dr. Vivi Ferns rectum, scattered pancolonic diverticula  . COLONOSCOPY  08/10/1999   internal hemorrhoids,inflammatory polyp  . ESOPHAGOGASTRODUODENOSCOPY  04/12/08   Prominent Schatzki's ring, erosive reflux esophagitis, multiple antral erosions, small hiatal hernia, reactive gastropathy, status post dilation with 19F  . ESOPHAGOGASTRODUODENOSCOPY  10/28/2011   Procedure: ESOPHAGOGASTRODUODENOSCOPY (EGD);  Surgeon: Daneil Dolin, MD;  Location: [facility];  Service: Endoscopy;  Laterality: N/A;  1:30  . ESOPHAGOGASTRODUODENOSCOPY N/A 09/10/2017   Dr. Gala Romney: Schatkzi's ring s/p dilation at GE junction, mild esophageal reflux esophagitis, medium sized hiatal hernia, normal duodenum. 56 and 35 Fr dilation  . EYE SURGERY    . MALONEY DILATION  10/28/2011   Procedure: Venia Minks DILATION;  Surgeon: Daneil Dolin, MD;  Location: [facility];  Service: Endoscopy;  Laterality: N/A;  . Venia Minks DILATION N/A 09/10/2017   Procedure: Venia Minks DILATION;  Surgeon: Daneil Dolin, MD;  Location: [facility];  Service: Endoscopy;  Laterality: N/A;  . SAVORY DILATION  10/28/2011   Procedure: SAVORY DILATION;  Surgeon: Daneil Dolin, MD;  Location: [facility];   Service: Endoscopy;  Laterality: N/A;  . TONSILLECTOMY    . TOTAL ANKLE ARTHROPLASTY Left 11/25/2013   Procedure: TOTAL ANKLE ARTHOPLASTY;  Surgeon: Wylene Simmer, MD;  Location: [place];  Service: Orthopedics;  Laterality: Left;  . TOTAL ANKLE ARTHROPLASTY Right 07/14/2014   dr hewitt  . TOTAL ANKLE ARTHROPLASTY Right 07/14/2014   Procedure: RIGHT TOTAL ANKLE ARTHOPLASTY;  Surgeon: Wylene Simmer, MD;  Location: [place];  Service: Orthopedics;  Laterality: Right;     Current Outpatient Medications  Medication Sig Dispense Refill  . amLODipine (NORVASC) 5 MG tablet TAKE 1 TABLET BY MOUTH  DAILY 90 tablet 3  . celecoxib (CELEBREX) 200 MG capsule Take 200 mg by mouth daily as needed for mild pain.     . diphenhydramine-acetaminophen (TYLENOL PM) 25-500 MG TABS tablet Take 1 tablet by mouth at bedtime as needed. Takes for sleep and mild discomfort    . docusate sodium (COLACE) 100 MG capsule Take 100 mg by mouth as needed for mild constipation.    . dofetilide (TIKOSYN) 500 MCG capsule Take 1 capsule (500 mcg total) by mouth 2 (two) times daily. 180 capsule 2  . ELIQUIS 5 MG TABS tablet TAKE 1 TABLET BY MOUTH TWO  TIMES DAILY 180 tablet 2  . finasteride (PROSCAR) 5 MG tablet Take 5 mg by mouth daily.    . fluticasone (FLONASE) 50 MCG/ACT nasal spray Place 1 spray into both nostrils daily as needed for allergies. 48 g 3  . gabapentin (NEURONTIN) 300 MG capsule Take 300 mg by mouth 2 (two) times daily.     Marland Kitchen ibuprofen (ADVIL,MOTRIN) 200 MG tablet Take 200 mg by mouth every 6 (six) hours as needed for headache. 200-400 mg as needed for HA    . levothyroxine (SYNTHROID) 75 MCG tablet Take 1 tablet (75 mcg total) by mouth daily. 90 tablet 1  . loratadine (CLARITIN) 10 MG tablet Take 10 mg by mouth daily as needed for allergies.    Marland Kitchen losartan (COZAAR) 50 MG tablet TAKE 1 TABLET BY MOUTH  DAILY 90 tablet 3  . Omega-3 Fatty Acids (RA FISH OIL) 1400 MG CPDR Take 1,400 mg by mouth daily.    . pantoprazole  (PROTONIX) 40 MG tablet TAKE 1 TABLET BY MOUTH  DAILY BEFORE BREAKFAST 90 tablet 3  . Polyethyl Glycol-Propyl Glycol (SYSTANE) 0.4-0.3 % SOLN Place 1 drop into both eyes 2 (two) times daily as needed (dry eyes).     . polyethylene glycol (MIRALAX / GLYCOLAX) 17 g packet Take 17 g by mouth daily.    . Tamsulosin HCl (FLOMAX) 0.4 MG CAPS Take 0.4 mg by mouth daily after breakfast.     . Turmeric 500 MG CAPS Take 500 mg by mouth daily.    . vitamin B-12 (CYANOCOBALAMIN) 500 MCG tablet Take 1,000 mcg by mouth daily.      No current facility-administered medications for this visit.    Allergies:   Tetanus toxoids, Statins, Dicyclomine, Sulfamethoxazole, Doxycycline, Oxycodone, Penicillins, Sulfa antibiotics, and Xarelto [rivaroxaban]   Social History:  The patient  reports that  he has never smoked. He has never used smokeless tobacco. He reports current alcohol use of about 2.0 standard drinks of alcohol per week. He reports that he does not use drugs.   Family History:  The patient's family history includes ALS in his son; Arthritis in his mother; Cancer in his father; Heart disease in his mother; Hypertension in his father; Prostate cancer in his brother and father; Prostate cancer (age of onset: 62) in his son.   ROS:  Please see the history of present illness.   Otherwise, review of systems is positive for none.   All other systems are reviewed and negative.   PHYSICAL EXAM: VS:  BP 132/82   Pulse 86   Ht 5\' 10"  (1.778 m)   Wt 196 lb 9.6 oz (89.2 kg)   SpO2 92%   BMI 28.21 kg/m  , BMI Body mass index is 28.21 kg/m. GEN: Well nourished, well developed, in no acute distress  HEENT: normal  Neck: no JVD, carotid bruits, or masses Cardiac: Irregular; no murmurs, rubs, or gallops,no edema  Respiratory:  clear to auscultation bilaterally, normal work of breathing GI: soft, nontender, nondistended, + BS MS: no deformity or atrophy  Skin: warm and dry Neuro:  Strength and sensation are  intact Psych: euthymic mood, full affect  EKG:  EKG is ordered today. Personal review of the ekg ordered shows atrial flutter, rate 86  Recent Labs: 12/07/2018: BUN 17; Creatinine, Ser 1.32; Hemoglobin 16.4; Platelets 250; Potassium 4.2; Sodium 143 06/08/2019: Magnesium 2.0    Lipid Panel     Component Value Date/Time   CHOL 179 04/02/2018 0924   TRIG 181 (H) 04/02/2018 0924   HDL 45 04/02/2018 0924   CHOLHDL 4.0 04/02/2018 0924   VLDL 30 09/22/2017 1444   LDLCALC 104 (H) 04/02/2018 0924     Wt Readings from Last 3 Encounters:  09/13/19 196 lb 9.6 oz (89.2 kg)  06/15/19 192 lb (87.1 kg)  06/08/19 199 lb 6.4 oz (90.4 kg)      Other studies Reviewed: Additional studies/ records that were reviewed today include: 30 day monitor 04/28/17 - personally reviewed  Review of the above records today demonstrates:   Atrial fibrillation and flutter seen throughout study.  Multiple pauses up to 3 seconds with most occurring during early morning hours (presumably asleep) and one occurring at approximately 1 pm.  TTE 03/21/17 - Left ventricle: The cavity size was normal. Wall thickness was   increased in a pattern of moderate LVH. Systolic function was   normal. The estimated ejection fraction was in the range of 60%   to 65%. Wall motion was normal; there were no regional wall   motion abnormalities. Doppler parameters are consistent with   restrictive physiology, indicative of decreased left ventricular   diastolic compliance and/or increased left atrial pressure.   Doppler parameters are consistent with high ventricular filling   pressure. - Aortic valve: Moderately calcified annulus. Trileaflet. - Mitral valve: Mildly calcified annulus. - Left atrium: The atrium was severely dilated. - Right ventricle: Systolic function was mildly reduced. - Right atrium: The atrium was mildly to moderately dilated. - Tricuspid valve: There was mild-moderate regurgitation. - Pulmonary arteries:  Incomplete TR jet to accurately estimated   PASP.  ASSESSMENT AND PLAN:  1.  Atrial fibrillation/atrial flutter: Currently on Eliquis and dofetilide.  CHA2DS2-VASc of 3.  Unfortunately he is back in atrial flutter today feeling weak and fatigued and short of breath.  At this point, he would prefer  to have an ablation performed.  Risks and benefits were discussed include bleeding, tamponade, heart block, stroke, damage to chest organs.  He understands these risks and has agreed to the procedure.  2. Syncope: No further episodes but could be related to his arrhythmias.  He has had near syncope in the mornings.  We Azreal Stthomas plan for a rhythm control strategy.   3. Hypertension: Currently well controlled  With primary cardiologist  Current medicines are reviewed at length with the patient today.   The patient does not have concerns regarding his medicines.  The following changes were made today: None  Labs/ tests ordered today include:  Orders Placed This Encounter  Procedures  . CT CARDIAC MORPH/PULM VEIN W/CM&W/O CA SCORE  . CT CORONARY FRACTIONAL FLOW RESERVE DATA PREP  . CT CORONARY FRACTIONAL FLOW RESERVE FLUID ANALYSIS  . Basic metabolic panel  . CBC  . EKG 12-Lead     Disposition:   FU with Adriene Knipfer 3 months  Signed, Derryck Shahan Brent Leeds, MD  09/13/2019 11:37 AM     [facility] [address] [place] [address] [phone] (office) [phone] (fax)

## 2019-09-14 ENCOUNTER — Telehealth: Payer: Self-pay | Admitting: Cardiology

## 2019-09-14 ENCOUNTER — Ambulatory Visit: Payer: Medicare Other | Admitting: Gastroenterology

## 2019-09-14 NOTE — Telephone Encounter (Signed)
New message   Patient is returning your call for test results. Please call.

## 2019-09-15 ENCOUNTER — Other Ambulatory Visit: Payer: Self-pay

## 2019-09-15 ENCOUNTER — Encounter: Payer: Self-pay | Admitting: Family Medicine

## 2019-09-15 ENCOUNTER — Ambulatory Visit (INDEPENDENT_AMBULATORY_CARE_PROVIDER_SITE_OTHER): Payer: Medicare Other | Admitting: Family Medicine

## 2019-09-15 VITALS — BP 131/92 | HR 69 | Ht 70.0 in | Wt 190.0 lb

## 2019-09-15 DIAGNOSIS — J3089 Other allergic rhinitis: Secondary | ICD-10-CM | POA: Diagnosis not present

## 2019-09-15 DIAGNOSIS — Z7189 Other specified counseling: Secondary | ICD-10-CM | POA: Diagnosis not present

## 2019-09-15 DIAGNOSIS — R0981 Nasal congestion: Secondary | ICD-10-CM | POA: Diagnosis not present

## 2019-09-15 DIAGNOSIS — I4891 Unspecified atrial fibrillation: Secondary | ICD-10-CM | POA: Diagnosis not present

## 2019-09-15 DIAGNOSIS — Z7185 Encounter for immunization safety counseling: Secondary | ICD-10-CM

## 2019-09-15 HISTORY — DX: Encounter for immunization safety counseling: Z71.85

## 2019-09-15 HISTORY — DX: Other specified counseling: Z71.89

## 2019-09-15 NOTE — Patient Instructions (Signed)
Happy New Year! May you have a year filled with hope, love, happiness and laughter.  I appreciate the opportunity to provide you with care for your health and wellness. Today we discussed: congestion, on going sinus issues  Follow up: 10/26/2019  No labs or referrals today  Due to upcoming vaccine for COVID, we should refrain from medications that can impact your immunity. Continue medications we discussed on the phone.  If you continue to have symptoms we will treat you a week out. If you develop shortness of breath not related to afib/flutter go to nearest ED.  PUSH FLUIDS, use tylenol as needed (do no take day off vaccine until after you received the vaccine.   I hope you feel better. Good luck with ablation.   Please continue to practice social distancing to keep you, your family, and our community safe.  If you must go out, please wear a mask and practice good handwashing.  It was a pleasure to see you and I look forward to continuing to work together on your health and well-being. Please do not hesitate to call the office if you need care or have questions about your care.  Have a wonderful day and week. With Gratitude, Cherly Beach, DNP, AGNP-BC

## 2019-09-15 NOTE — Assessment & Plan Note (Addendum)
Continues to have sinus congestion, possible need for ENT referral and or CT of sinus. He has hx of allergies since he was a young boy. But recently has been treated several times within the last year with antibiotics.  Refraining from them this time and encouraging use of symptom management OTC. Follow up in week if not better. Does get second COVID vaccine on 2/12   Patient acknowledged agreement and understanding of the plan.

## 2019-09-15 NOTE — Assessment & Plan Note (Signed)
On going allergy issues, perhaps adjust to meds is needed in near future. Will continue current ones and decided at next appt if ENT or allergist referral is needed.  Patient acknowledged agreement and understanding of the plan.

## 2019-09-15 NOTE — Assessment & Plan Note (Signed)
Will be having an ablation in next 2 weeks as he is showing flutter now. Close follow up with Cards. Please let PCP know if I can help with anything.

## 2019-09-15 NOTE — Progress Notes (Signed)
Virtual Visit via Telephone Note   This visit type was conducted due to national recommendations for restrictions regarding the COVID-19 Pandemic (e.g. social distancing) in an effort to limit this patient's exposure and mitigate transmission in our community.  Due to his co-morbid illnesses, this patient is at least at moderate risk for complications without adequate follow up.  This format is felt to be most appropriate for this patient at this time.  The patient did not have access to video technology/had technical difficulties with video requiring transitioning to audio format only (telephone).  All issues noted in this document were discussed and addressed.  No physical exam could be performed with this format.    Evaluation Performed:  Follow-up visit  Date:  09/15/2019   ID:  Brent Wilkins, Brent Wilkins [birth year], MRN [id number]  Patient Location: Home Provider Location: Office  Location of Patient: Home Location of Provider: Telehealth Consent was obtain for visit to be over via telehealth. I verified that I am speaking with the correct person using two identifiers.  PCP:  [name], NP   Chief Complaint: Sinus congestion  History of Present Illness:    Brent Wilkins is a 79 y.o. male with extensive history including allergies, chronic kidney disease, hypertension, atrial fib, seasonal allergies, chronic sinus congestion over the last several months.  Calls in today to discuss whether or not he should get medication for ongoing sinus congestion.  He reports that he has had a cough for the last 2 weeks.  He reports last 48 hours he started to have some chest discomfort from coughing.  He reports that most of this is sinus head congestion not from the chest.  He has been using a Nettie pot, Mucinex, nighttime cough syrup.  He reports that those have been controlling it for the most part.  But he just was not sure if he should get a antibiotic or not.  He reports that he has a  Covid vaccine the second 1 this Friday.  An upcoming ablation in 2 weeks as he is having atrial fib and flutter off and on.  He would like to refrain from being referred to an ENT unless he cannot get this under control.  The patient does not have symptoms concerning for COVID-19 infection (fever, chills, cough, or new shortness of breath).   Past Medical, Surgical, Social History, Allergies, and Medications have been Reviewed.  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 on Tikosyn and Eliquis   Polyneuropathy 06/16/2017   Prostate cancer (HCC) 2010   Dr. Rosana Hoes   Schatzki's ring 10/22/2011   Seasonal allergies    Syncope  dx. 8 yrs ago   Past Surgical History:  Procedure Laterality Date   APPENDECTOMY     CATARACT EXTRACTION W/PHACO Left 03/27/2015   Procedure: CATARACT EXTRACTION PHACO AND INTRAOCULAR LENS PLACEMENT LEFT EYE CDE=11.73;  Surgeon: Levada Dy  Geoffry Paradise, MD;  Location: [facility];  Service: Ophthalmology;  Laterality: Left;   CATARACT EXTRACTION W/PHACO Right 03/30/2015   Procedure: CATARACT EXTRACTION PHACO AND INTRAOCULAR LENS PLACEMENT RIGHT EYE CDE=7.35;  Surgeon: Tonny Branch, MD;  Location: [facility];  Service: Ophthalmology;  Laterality: Right;   CHOLECYSTECTOMY     COLONOSCOPY  07/23/10   Dr. Vivi Ferns rectum, scattered pancolonic diverticula   COLONOSCOPY  08/10/1999   internal hemorrhoids,inflammatory polyp   ESOPHAGOGASTRODUODENOSCOPY  04/12/08   Prominent Schatzki's ring, erosive reflux esophagitis, multiple antral erosions,  small hiatal hernia, reactive gastropathy, status post dilation with 47F   ESOPHAGOGASTRODUODENOSCOPY  10/28/2011   Procedure: ESOPHAGOGASTRODUODENOSCOPY (EGD);  Surgeon: Daneil Dolin, MD;  [facility];  Service: Endoscopy;  Laterality: N/A;  1:30   ESOPHAGOGASTRODUODENOSCOPY N/A 09/10/2017   Dr. Gala Romney: Schatkzi's ring s/p dilation at GE junction, mild esophageal reflux esophagitis, medium sized hiatal hernia, normal duodenum. 64 and 47 Fr dilation   EYE SURGERY     MALONEY DILATION  10/28/2011   Procedure: MALONEY DILATION;  Surgeon: Daneil Dolin, MD;  [facility];  Service: Endoscopy;  Laterality: N/A;   MALONEY DILATION N/A 09/10/2017   Procedure: Venia Minks DILATION;  Surgeon: Daneil Dolin, MD;  [facility];  Service: Endoscopy;  Laterality: N/A;   SAVORY DILATION  10/28/2011   Procedure: SAVORY DILATION;  Surgeon: Daneil Dolin, MD;  [facility];  Service: Endoscopy;  Laterality: N/A;   TONSILLECTOMY     TOTAL ANKLE ARTHROPLASTY Left 11/25/2013   Procedure: TOTAL ANKLE ARTHOPLASTY;  Surgeon: Wylene Simmer, MD;  Location: [place] [facility];  Service: Orthopedics;  Laterality: Left;   TOTAL ANKLE ARTHROPLASTY Right 07/14/2014   dr hewitt   TOTAL ANKLE ARTHROPLASTY Right 07/14/2014   Procedure: RIGHT TOTAL ANKLE ARTHOPLASTY;  Surgeon: Wylene Simmer, MD;  Location: [place];  Service: Orthopedics;  Laterality: Right;     Current Meds  Medication Sig   amLODipine (NORVASC) 5 MG tablet TAKE 1 TABLET BY MOUTH  DAILY   celecoxib (CELEBREX) 200 MG capsule Take 200 mg by mouth daily as needed for mild pain.    diphenhydramine-acetaminophen (TYLENOL PM) 25-500 MG TABS tablet Take 1 tablet by mouth at bedtime as needed. Takes for sleep and mild discomfort   docusate sodium (COLACE) 100 MG capsule Take 100 mg by mouth as needed for mild constipation.   dofetilide (TIKOSYN) 500 MCG capsule Take 1 capsule (500 mcg total) by mouth 2 (two) times daily.    ELIQUIS 5 MG TABS tablet TAKE 1 TABLET BY MOUTH TWO  TIMES DAILY   finasteride (PROSCAR) 5 MG tablet Take 5 mg by mouth daily.   fluticasone (FLONASE) 50 MCG/ACT nasal spray Place 1 spray into both nostrils daily as needed for allergies.   gabapentin (NEURONTIN) 300 MG capsule Take 300 mg by mouth 2 (two) times daily.    ibuprofen (ADVIL,MOTRIN) 200 MG tablet Take 200 mg by mouth every 6 (six) hours as needed for headache. 200-400 mg as needed for HA   levothyroxine (SYNTHROID) 75 MCG tablet Take 1 tablet (75 mcg total) by mouth daily.   loratadine (CLARITIN) 10 MG tablet Take 10 mg by mouth daily as needed for allergies.   losartan (COZAAR) 50 MG tablet TAKE 1 TABLET BY MOUTH  DAILY   Omega-3 Fatty Acids (RA FISH OIL) 1400 MG CPDR Take 1,400 mg by mouth daily.   pantoprazole (PROTONIX) 40 MG tablet TAKE 1 TABLET BY MOUTH  DAILY BEFORE BREAKFAST  Polyethyl Glycol-Propyl Glycol (SYSTANE) 0.4-0.3 % SOLN Place 1 drop into both eyes 2 (two) times daily as needed (dry eyes).    polyethylene glycol (MIRALAX / GLYCOLAX) 17 g packet Take 17 g by mouth daily.   Tamsulosin HCl (FLOMAX) 0.4 MG CAPS Take 0.4 mg by mouth daily after breakfast.    Turmeric 500 MG CAPS Take 500 mg by mouth daily.   vitamin B-12 (CYANOCOBALAMIN) 500 MCG tablet Take 1,000 mcg by mouth daily.      Allergies:   Tetanus toxoids, Statins, Dicyclomine, Sulfamethoxazole, Doxycycline, Oxycodone, Penicillins, Sulfa antibiotics, and Xarelto [rivaroxaban]   ROS:   Please see the history of present illness.    All other systems reviewed and are negative.   Labs/Other Tests and Data Reviewed:    Recent Labs: 06/08/2019: Magnesium 2.0 09/13/2019: BUN 16; Creatinine, Ser 1.34; Hemoglobin 12.8; Platelets 379; Potassium 4.2; Sodium 142   Recent Lipid Panel Lab Results  Component Value Date/Time   CHOL 179 04/02/2018 09:24 AM   TRIG 181 (H) 04/02/2018 09:24 AM   HDL 45 04/02/2018 09:24 AM   CHOLHDL 4.0 04/02/2018 09:24  AM   LDLCALC 104 (H) 04/02/2018 09:24 AM    Wt Readings from Last 3 Encounters:  09/15/19 190 lb (86.2 kg)  09/13/19 196 lb 9.6 oz (89.2 kg)  06/15/19 192 lb (87.1 kg)     Objective:    Vital Signs:  BP (!) 131/92 Comment: pt reported   Pulse 69 Comment: pt reported   Ht 5\' 10"  (1.778 m)    Wt 190 lb (86.2 kg)    BMI 27.26 kg/m    VITAL SIGNS:  reviewed GEN:  alert and oriented  RESPIRATORY:  no shortness of breath in conversation  PSYCH:  normal affect and mood   ASSESSMENT & PLAN:     1. Environmental and seasonal allergies 2. Sinus congestion 3. Vaccine counseling 4. Atrial fibrillation, unspecified type ([place])    Time:   Today, I have spent 13 minutes with the patient with telehealth technology discussing the above problems.     Medication Adjustments/Labs and Tests Ordered: Current medicines are reviewed at length with the patient today.  Concerns regarding medicines are outlined above.   Tests Ordered: No orders of the defined types were placed in this encounter.   Medication Changes: No orders of the defined types were placed in this encounter.   Disposition:  Follow up as scheduled 3/23 Signed, [name], NP  09/15/2019 10:06 AM     [place]

## 2019-09-15 NOTE — Assessment & Plan Note (Signed)
Provided with education on COVID 19 second vaccine. Will get getting on 09/16/2018. Advised to not have antibiotics for possible sinus issues, which might also be uncontrolled allergies. Patient acknowledged agreement and understanding of the plan.

## 2019-09-16 ENCOUNTER — Ambulatory Visit: Payer: Medicare Other | Admitting: Gastroenterology

## 2019-09-16 NOTE — Telephone Encounter (Signed)
Informed patient of results and verbal understanding expressed.  

## 2019-09-23 ENCOUNTER — Encounter: Payer: Self-pay | Admitting: Family Medicine

## 2019-09-23 ENCOUNTER — Ambulatory Visit (INDEPENDENT_AMBULATORY_CARE_PROVIDER_SITE_OTHER): Payer: Medicare Other | Admitting: Family Medicine

## 2019-09-23 ENCOUNTER — Other Ambulatory Visit: Payer: Self-pay

## 2019-09-23 DIAGNOSIS — R0989 Other specified symptoms and signs involving the circulatory and respiratory systems: Secondary | ICD-10-CM

## 2019-09-23 DIAGNOSIS — J0111 Acute recurrent frontal sinusitis: Secondary | ICD-10-CM | POA: Insufficient documentation

## 2019-09-23 MED ORDER — LEVOFLOXACIN 500 MG PO TABS: 500.0000 mg | ORAL_TABLET | Freq: Every day | ORAL | 0 refills | Status: AC

## 2019-09-23 MED ORDER — PREDNISONE 10 MG (21) PO TBPK: ORAL_TABLET | ORAL | 0 refills | Status: DC

## 2019-09-23 NOTE — Assessment & Plan Note (Signed)
Continues to have ongoing frontal/maxillary sinus congestion and inflammation.  Now reports that he feels like he is having it settled into his chest.  Has upcoming ablation will be treated with Levaquin has several allergies to other antibiotics.  In addition to doing a short course of prednisone for any inflammation that he is experiencing.  Possible need for referral to ENT secondary to having ongoing sinus infections.  He is open to this and will report back after his ablation to see if this is something that he is willing to do at that time. Reviewed side effects, risks and benefits of medication.   Patient acknowledged agreement and understanding of the plan.

## 2019-09-23 NOTE — Patient Instructions (Addendum)
Happy New Year! May you have a year filled with hope, love, happiness and laughter.  I appreciate the opportunity to provide you with care for your health and wellness. Today we discussed: Sinus congestion  Follow up: 10/26/2019 as already scheduled  No labs or referrals today  I have sent in an antibiotic for a week, Levaquin. Please take this as directed.  If he have any issues please not hesitate to reach out. I have also sent in a short course of prednisone Dosepak to help with the chest congestion inflammation from coughing and hopefully to help with any polyps that might be swelling in the sinus cavity preventing you from having adequate drainage.  After your ablation we will look at possible need for ENT if this is an ongoing issue and you would be willing.  Please continue to practice social distancing to keep you, your family, and our community safe.  If you must go out, please wear a mask and practice good handwashing.  It was a pleasure to see you and I look forward to continuing to work together on your health and well-being. Please do not hesitate to call the office if you need care or have questions about your care.  Have a wonderful day and week. With Gratitude, Cherly Beach, DNP, AGNP-BC

## 2019-09-23 NOTE — Progress Notes (Signed)
Virtual Visit via Telephone Note   This visit type was conducted due to national recommendations for restrictions regarding the COVID-19 Pandemic (e.g. social distancing) in an effort to limit this patient's exposure and mitigate transmission in our community.  Due to his co-morbid illnesses, this patient is at least at moderate risk for complications without adequate follow up.  This format is felt to be most appropriate for this patient at this time.  The patient did not have access to video technology/had technical difficulties with video requiring transitioning to audio format only (telephone).  All issues noted in this document were discussed and addressed.  No physical exam could be performed with this format.     Evaluation Performed:  Follow-up visit  Date:  09/23/2019   ID:  Brent Wilkins, Brent Wilkins [birth year], MRN [id number]  Patient Location: Home Provider Location: Office  Location of Patient: Home Location of Provider: Telehealth Consent was obtain for visit to be over via telehealth. I verified that I am speaking with the correct person using two identifiers.  PCP:  [name], NP   Chief Complaint: Ongoing sinus infection  History of Present Illness:    Brent Wilkins is a 79 y.o. male with history including allergies, chronic kidney disease, atrial fibrillation, hypertension, seasonal allergies, chronic sinus congestion over the last several months.  Was treated in November with Levaquin reports that that helped some but he had a return of it several weeks later and also has had to have a dental procedure which required him to be on antibiotic.  He has an upcoming ablation in 10 to 12 days and is not feeling very well and does not want to have to cancel this particular appointment.  Has tried to get his congestion under control with over-the-counter Mucinex, nighttime cough syrup and a Nettie pot use.  He did have a Covid vaccine last Friday and therefore we refrain  from starting her on any antibiotics at that time to keep his immune system strong.  As previously discussed he is open to being referred to ENT if this does not help take away the ongoing congestion.  The patient does not have symptoms concerning for COVID-19 infection (fever, chills, cough, or new shortness of breath).   Past Medical, Surgical, Social History, Allergies, and Medications have been Reviewed.  Past Medical History:  Diagnosis Date  . Allergy    hay fever;  seasonal  . Arthritis    right ankle  . Cataract    surgery 2016  . Chronic kidney disease    stones many years ago  . Dizziness 12/13/2015  . Dysphagia   . Frequent urination at night   . GERD (gastroesophageal reflux disease)   . History of kidney stones   . HTN (hypertension)   . Hypothyroidism   . 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 Neuropathy    Bilateral ankles  . Paroxysmal atrial fibrillation (HCC)    a. on Tikosyn and Eliquis  . Polyneuropathy 06/16/2017  . Prostate cancer [facility]) 2010   Dr. Rosana Hoes  . Schatzki's ring 10/22/2011  . Seasonal allergies   . Syncope  dx. 8 yrs ago   Past Surgical History:  Procedure Laterality Date  . APPENDECTOMY    . CATARACT EXTRACTION W/PHACO Left 03/27/2015   Procedure: CATARACT EXTRACTION PHACO AND INTRAOCULAR LENS PLACEMENT LEFT EYE CDE=11.73;  Surgeon: Tonny Branch, MD;  Location: [facility];  Service: Ophthalmology;  Laterality: Left;  . CATARACT  EXTRACTION W/PHACO Right 03/30/2015   Procedure: CATARACT EXTRACTION PHACO AND INTRAOCULAR LENS PLACEMENT RIGHT EYE CDE=7.35;  Surgeon: Tonny Branch, MD;  Location: [facility];  Service: Ophthalmology;  Laterality: Right;  . CHOLECYSTECTOMY    . COLONOSCOPY  07/23/10   Dr. Vivi Ferns rectum, scattered pancolonic diverticula  . COLONOSCOPY  08/10/1999   internal hemorrhoids,inflammatory polyp  . ESOPHAGOGASTRODUODENOSCOPY  04/12/08   Prominent Schatzki's ring, erosive reflux esophagitis, multiple antral erosions, small hiatal  hernia, reactive gastropathy, status post dilation with 29F  . ESOPHAGOGASTRODUODENOSCOPY  10/28/2011   Procedure: ESOPHAGOGASTRODUODENOSCOPY (EGD);  Surgeon: Daneil Dolin, MD;  [facility];  Service: Endoscopy;  Laterality: N/A;  1:30  . ESOPHAGOGASTRODUODENOSCOPY N/A 09/10/2017   Dr. Gala Romney: Schatkzi's ring s/p dilation at GE junction, mild esophageal reflux esophagitis, medium sized hiatal hernia, normal duodenum. 56 and 33 Fr dilation  . EYE SURGERY    . MALONEY DILATION  10/28/2011   Procedure: Venia Minks DILATION;  Surgeon: Daneil Dolin, MD;  [facility];  Service: Endoscopy;  Laterality: N/A;  . Venia Minks DILATION N/A 09/10/2017   Procedure: Venia Minks DILATION;  Surgeon: Daneil Dolin, MD;  [facility];  Service: Endoscopy;  Laterality: N/A;  . SAVORY DILATION  10/28/2011   Procedure: SAVORY DILATION;  Surgeon: Daneil Dolin, MD;  [facility];  Service: Endoscopy;  Laterality: N/A;  . TONSILLECTOMY    . TOTAL ANKLE ARTHROPLASTY Left 11/25/2013   Procedure: TOTAL ANKLE ARTHOPLASTY;  Surgeon: Wylene Simmer, MD;  Location: [place];  Service: Orthopedics;  Laterality: Left;  . TOTAL ANKLE ARTHROPLASTY Right 07/14/2014   dr hewitt  . TOTAL ANKLE ARTHROPLASTY Right 07/14/2014   Procedure: RIGHT TOTAL ANKLE ARTHOPLASTY;  Surgeon: Wylene Simmer, MD;  Location: [place];  Service: Orthopedics;  Laterality: Right;     No outpatient medications have been marked as taking for the 09/23/19 encounter (Office Visit) with [name], NP.     Allergies:   Tetanus toxoids, Statins, Dicyclomine, Sulfamethoxazole, Doxycycline, Oxycodone, Penicillins, Sulfa antibiotics, and Xarelto [rivaroxaban]   ROS:   Please see the history of present illness.    All other systems reviewed and are negative.   Labs/Other Tests and Data Reviewed:    Recent Labs: 06/08/2019: Magnesium 2.0 09/13/2019: BUN 16; Creatinine, Ser 1.34; Hemoglobin 12.8; Platelets 379; Potassium 4.2;  Sodium 142   Recent Lipid Panel Lab Results  Component Value Date/Time   CHOL 179 04/02/2018 09:24 AM   TRIG 181 (H) 04/02/2018 09:24 AM   HDL 45 04/02/2018 09:24 AM   CHOLHDL 4.0 04/02/2018 09:24 AM   LDLCALC 104 (H) 04/02/2018 09:24 AM    Wt Readings from Last 3 Encounters:  09/15/19 190 lb (86.2 kg)  09/13/19 196 lb 9.6 oz (89.2 kg)  06/15/19 192 lb (87.1 kg)     Objective:    Vital Signs:  There were no vitals taken for this visit.   GEN:  alert and oriented  RESPIRATORY:  no shortness of breath in conversation  PSYCH:  normal affect and mood   ASSESSMENT & PLAN:   1. Acute recurrent frontal sinusitis  - levofloxacin (LEVAQUIN) 500 MG tablet; Take 1 tablet (500 mg total) by mouth daily for 7 days.  Dispense: 7 tablet; Refill: 0 - predniSONE (STERAPRED UNI-PAK 21 TAB) 10 MG (21) TBPK tablet; Take as directed  Dispense: 21 tablet; Refill: 0  2. Chest congestion  - levofloxacin (LEVAQUIN) 500 MG tablet; Take 1 tablet (500 mg total) by mouth  daily for 7 days.  Dispense: 7 tablet; Refill: 0 - predniSONE (STERAPRED UNI-PAK 21 TAB) 10 MG (21) TBPK tablet; Take as directed  Dispense: 21 tablet; Refill: 0    Time:   Today, I have spent 11 minutes with the patient with telehealth technology discussing the above problems.     Medication Adjustments/Labs and Tests Ordered: Current medicines are reviewed at length with the patient today.  Concerns regarding medicines are outlined above.   Tests Ordered: No orders of the defined types were placed in this encounter.   Medication Changes: Meds ordered this encounter  Medications  . levofloxacin (LEVAQUIN) 500 MG tablet    Sig: Take 1 tablet (500 mg total) by mouth daily for 7 days.    Dispense:  7 tablet    Refill:  0    Order Specific Question:   Supervising Provider    Answer:   SIMPSON, MARGARET E [id number]  . predniSONE (STERAPRED UNI-PAK 21 TAB) 10 MG (21) TBPK tablet    Sig: Take as directed    Dispense:  21  tablet    Refill:  0    Order Specific Question:   Supervising Provider    Answer:   Fayrene Helper [[id number]]    Disposition:  Follow up as scheduled Signed, [name], NP  09/23/2019 9:57 AM     [facility]

## 2019-09-28 ENCOUNTER — Ambulatory Visit: Payer: Medicare Other | Admitting: Gastroenterology

## 2019-09-28 ENCOUNTER — Encounter: Payer: Self-pay | Admitting: Gastroenterology

## 2019-09-28 ENCOUNTER — Other Ambulatory Visit: Payer: Self-pay

## 2019-09-28 VITALS — BP 113/72 | HR 73 | Temp 96.8°F | Ht 70.0 in | Wt 195.4 lb

## 2019-09-28 DIAGNOSIS — K219 Gastro-esophageal reflux disease without esophagitis: Secondary | ICD-10-CM

## 2019-09-28 DIAGNOSIS — R1310 Dysphagia, unspecified: Secondary | ICD-10-CM

## 2019-09-28 NOTE — Patient Instructions (Signed)
Continue Protonix once a day, 30 minutes before breakfast.  Once sinus infection clears, please call if you are still having throat clearing.  We will see you in 6 months or sooner if needed!  I enjoyed seeing you again today! As you know, I value our relationship and want to provide genuine, compassionate, and quality care. I welcome your feedback. If you receive a survey regarding your visit,  I greatly appreciate you taking time to fill this out. See you next time!  Annitta Needs, PhD, ANP-BC [facility]

## 2019-09-28 NOTE — Progress Notes (Signed)
Referring Provider: [name], NP Primary Care Physician:  [name], NP Primary GI: Dr. Gala Romney    Chief Complaint  Patient presents with  . Dysphagia    HPI:   Brent Wilkins is an 79 y.o. male presenting today with a history of esophagitis, history of Schaztki's ring s/p dilation in Feb 2019, large hiatal hernia, without solid food dysphagia but noting liquid dysphagia specifically with cold water. Several incidences of epigastric pain/non-cardiac chest pain over past few years, with patient concerned about hiatal hernia at last visit. He underwent BPE, which showed esophageal motility disorder. He does not have normal primary peristalsis and noted severe tertiary, or irregular contractions.   Had another episode of severe constipation recently around Christmas. Took Miralax. BM usually daily. No supplemental fiber.   Has throat clearing. Getting over a sinus infection. Globus sensation. Started around time of sinus infection. Feels cold water hanging up at neck. Burping a lot. Careful eating solid foods. No solid food dysphagia. No odynophagia. Has to be careful with fluids. Protonix once daily. PCP discussed with patient possible ENT referral due to chronic sinus infections. Patient wants to hold off on BID dosing PPI.   Has ablation upcoming next week. No energy.   Past Medical History:  Diagnosis Date  . Allergy    hay fever;  seasonal  . Arthritis    right ankle  . Cataract    surgery 2016  . Chronic kidney disease    stones many years ago  . Dizziness 12/13/2015  . Dysphagia   . Frequent urination at night   . GERD (gastroesophageal reflux disease)   . History of kidney stones   . HTN (hypertension)   . Hypothyroidism   . 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 Neuropathy    Bilateral ankles  . Paroxysmal atrial fibrillation (HCC)    a. on Tikosyn and Eliquis  . Polyneuropathy 06/16/2017  . Prostate cancer [facility]) 2010   Dr. Rosana Hoes  . Schatzki's  ring 10/22/2011  . Seasonal allergies   . Syncope  dx. 8 yrs ago    Past Surgical History:  Procedure Laterality Date  . APPENDECTOMY    . CATARACT EXTRACTION W/PHACO Left 03/27/2015   Procedure: CATARACT EXTRACTION PHACO AND INTRAOCULAR LENS PLACEMENT LEFT EYE CDE=11.73;  Surgeon: Tonny Branch, MD;  Location: [facility];  Service: Ophthalmology;  Laterality: Left;  . CATARACT EXTRACTION W/PHACO Right 03/30/2015   Procedure: CATARACT EXTRACTION PHACO AND INTRAOCULAR LENS PLACEMENT RIGHT EYE CDE=7.35;  Surgeon: Tonny Branch, MD;  Location: [facility];  Service: Ophthalmology;  Laterality: Right;  . CHOLECYSTECTOMY    . COLONOSCOPY  07/23/10   Dr. Vivi Ferns rectum, scattered pancolonic diverticula  . COLONOSCOPY  08/10/1999   internal hemorrhoids,inflammatory polyp  . ESOPHAGOGASTRODUODENOSCOPY  04/12/08   Prominent Schatzki's ring, erosive reflux esophagitis, multiple antral erosions, small hiatal hernia, reactive gastropathy, status post dilation with 48F  . ESOPHAGOGASTRODUODENOSCOPY  10/28/2011   Procedure: ESOPHAGOGASTRODUODENOSCOPY (EGD);  Surgeon: Daneil Dolin, MD;  Location[facility];  Service: Endoscopy;  Laterality: N/A;  1:30  . ESOPHAGOGASTRODUODENOSCOPY N/A 09/10/2017   Dr. Gala Romney: Schatkzi's ring s/p dilation at GE junction, mild esophageal reflux esophagitis, medium sized hiatal hernia, normal duodenum. 56 and 48 Fr dilation  . EYE SURGERY    . MALONEY DILATION  10/28/2011   Procedure: Venia Minks DILATION;  Surgeon: Daneil Dolin, MD;  [facility]: [facility];  Service: Endoscopy;  Laterality:  N/A;  Venia Minks DILATION N/A 09/10/2017   Procedure: Venia Minks DILATION;  Surgeon: Daneil Dolin, MD;  [facility];  Service: Endoscopy;  Laterality: N/A;  . SAVORY DILATION  10/28/2011   Procedure: SAVORY DILATION;  Surgeon: Daneil Dolin, MD;  [facility];  Service: Endoscopy;  Laterality: N/A;  . TONSILLECTOMY    . TOTAL ANKLE ARTHROPLASTY Left 11/25/2013   Procedure: TOTAL  ANKLE ARTHOPLASTY;  Surgeon: Wylene Simmer, MD;  Location: [place];  Service: Orthopedics;  Laterality: Left;  . TOTAL ANKLE ARTHROPLASTY Right 07/14/2014   dr hewitt  . TOTAL ANKLE ARTHROPLASTY Right 07/14/2014   Procedure: RIGHT TOTAL ANKLE ARTHOPLASTY;  Surgeon: Wylene Simmer, MD;  Location: [place];  Service: Orthopedics;  Laterality: Right;    Current Outpatient Medications  Medication Sig Dispense Refill  . amLODipine (NORVASC) 5 MG tablet TAKE 1 TABLET BY MOUTH  DAILY (Patient taking differently: Take 5 mg by mouth every evening. ) 90 tablet 3  . celecoxib (CELEBREX) 200 MG capsule Take 200 mg by mouth daily as needed for mild pain.     . diphenhydramine-acetaminophen (TYLENOL PM) 25-500 MG TABS tablet Take 1 tablet by mouth at bedtime as needed (sleep and mild discomfort).     . docusate sodium (COLACE) 100 MG capsule Take 100 mg by mouth as needed for mild constipation.    . dofetilide (TIKOSYN) 500 MCG capsule Take 1 capsule (500 mcg total) by mouth 2 (two) times daily. 180 capsule 2  . ELIQUIS 5 MG TABS tablet TAKE 1 TABLET BY MOUTH TWO  TIMES DAILY (Patient taking differently: Take 5 mg by mouth 2 (two) times daily. ) 180 tablet 2  . finasteride (PROSCAR) 5 MG tablet Take 5 mg by mouth daily.    . fluticasone (FLONASE) 50 MCG/ACT nasal spray Place 1 spray into both nostrils daily as needed for allergies. 48 g 3  . gabapentin (NEURONTIN) 300 MG capsule Take 300 mg by mouth 2 (two) times daily.     Marland Kitchen ibuprofen (ADVIL,MOTRIN) 200 MG tablet Take 400 mg by mouth every 8 (eight) hours as needed for headache (headache/pain.).     Marland Kitchen levofloxacin (LEVAQUIN) 500 MG tablet Take 1 tablet (500 mg total) by mouth daily for 7 days. 7 tablet 0  . levothyroxine (SYNTHROID) 75 MCG tablet Take 1 tablet (75 mcg total) by mouth daily. 90 tablet 1  . loratadine (CLARITIN) 10 MG tablet Take 10 mg by mouth daily as needed for allergies.    Marland Kitchen losartan (COZAAR) 50 MG tablet TAKE 1 TABLET BY MOUTH  DAILY (Patient  taking differently: Take 50 mg by mouth every evening. ) 90 tablet 3  . Omega-3 Fatty Acids (RA FISH OIL) 1400 MG CPDR Take 1,400 mg by mouth daily.    . pantoprazole (PROTONIX) 40 MG tablet TAKE 1 TABLET BY MOUTH  DAILY BEFORE BREAKFAST (Patient taking differently: Take 40 mg by mouth daily. ) 90 tablet 3  . Polyethyl Glycol-Propyl Glycol (SYSTANE) 0.4-0.3 % SOLN Place 1 drop into both eyes 2 (two) times daily as needed (dry eyes).     . predniSONE (STERAPRED UNI-PAK 21 TAB) 10 MG (21) TBPK tablet Take as directed 21 tablet 0  . Tamsulosin HCl (FLOMAX) 0.4 MG CAPS Take 0.4 mg by mouth daily after breakfast.     . vitamin B-12 (CYANOCOBALAMIN) 1000 MCG tablet Take 1,000 mcg by mouth daily.     No current facility-administered medications for this visit.    Allergies as  of 09/28/2019 - Review Complete 09/28/2019  Allergen Reaction Noted  . Tetanus toxoids Swelling 05/10/2013  . Statins Other (See Comments) 10/12/2012  . Dicyclomine Other (See Comments) 12/21/2018  . Sulfamethoxazole  09/26/2011  . Doxycycline Rash 09/30/2011  . Oxycodone Other (See Comments) 07/06/2014  . Penicillins Swelling and Rash 09/26/2011  . Sulfa antibiotics Swelling and Rash 10/22/2011  . Xarelto [rivaroxaban] Rash 06/16/2017    Family History  Problem Relation Age of Onset  . Prostate cancer Father   . Cancer Father        prostate to bone  . Hypertension Father   . Prostate cancer Son 94  . ALS Son   . Prostate cancer Brother   . Heart disease Mother        died at 74  . Arthritis Mother   . Colon cancer Neg Hx     Social History   Socioeconomic History  . Marital status: Married    Spouse name: Candace  . Number of children: 2  . Years of education: Not on file  . Highest education level: Bachelor's degree (e.g., BA, AB, BS)  Occupational History  . Occupation: retired; Ambulance person: RETIRED  Tobacco Use  . Smoking status: Never Smoker  . Smokeless tobacco: Never Used    Substance and Sexual Activity  . Alcohol use: Yes    Alcohol/week: 2.0 standard drinks    Types: 1 Glasses of wine, 1 Cans of beer per week    Comment: glass of wine or "couple of beers" occasionally   . Drug use: No  . Sexual activity: Yes    Partners: Female  Other Topics Concern  . Not on file  Social History Narrative   Lives with wife Candace.       Moved from [place], [place]   Retired from [employer].   Plays golf.   Works with [employer] and [employer] for [employer].   Wears seatbelt.   Drives.   Eats all food groups.   Never smoked.   Married for over 53 years June  (2020)   Has two grown children, live in [place] and [place].   Social Determinants of Health   Financial Resource Strain:   . Difficulty of Paying Living Expenses: Not on file  Food Insecurity:   . Worried About Charity fundraiser in the Last Year: Not on file  . Ran Out of Food in the Last Year: Not on file  Transportation Needs:   . Lack of Transportation (Medical): Not on file  . Lack of Transportation (Non-Medical): Not on file  Physical Activity:   . Days of Exercise per Week: Not on file  . Minutes of Exercise per Session: Not on file  Stress:   . Feeling of Stress : Not on file  Social Connections:   . Frequency of Communication with Friends and Family: Not on file  . Frequency of Social Gatherings with Friends and Family: Not on file  . Attends Religious Services: Not on file  . Active Member of Clubs or Organizations: Not on file  . Attends Archivist Meetings: Not on file  . Marital Status: Not on file    Review of Systems: Gen: Denies fever, chills, anorexia. Denies fatigue, weakness, weight loss.  CV: Denies chest pain, palpitations, syncope, peripheral edema, and claudication. Resp: Denies dyspnea at rest, cough, wheezing, coughing up blood, and pleurisy. GI: see HPI Derm: Denies rash, itching, dry skin Psych: Denies depression, anxiety,  memory loss, confusion. No homicidal  or suicidal ideation.  Heme: Denies bruising, bleeding, and enlarged lymph nodes.  Physical Exam: BP 113/72   Pulse 73   Temp (!) 96.8 F (36 C) (Temporal)   Ht 5\' 10"  (1.778 m)   Wt 195 lb 6.4 oz (88.6 kg)   BMI 28.04 kg/m  General:   Alert and oriented. No distress noted. Pleasant and cooperative.  Head:  Normocephalic and atraumatic. Eyes:  Conjuctiva clear without scleral icterus. Abdomen:  +BS, soft, non-tender and non-distended. No rebound or guarding. No HSM or masses noted. Msk:  Symmetrical without gross deformities. Normal posture. Extremities:  Without edema. Neurologic:  Alert and  oriented x4 Psych:  Alert and cooperative. Normal mood and affect.  ASSESSMENT: Brent Wilkins is an 79 y.o. male presenting today with a history of esophagitis, Schatzki's ring s/p dilation in Feb 2019, known large hiatal hernia, with liquid dysphagia but no solid food dysphagia. Now with globus sensation.  Declining increase in PPI to BID. BPE on file with esophageal dysmotility and severe tertiary contractions. Declining further evaluation at this point. Discussed ENT referral if persistent globus sensation; he would like to recover from sinus infection first and then re-evaluate.    PLAN:  Continue Protonix once daily. May need increase to BID  Call if persistent globus sensation  Return in 6 months or sooner if needed   Annitta Needs, PhD, ANP-BC [facility]

## 2019-09-29 ENCOUNTER — Telehealth (HOSPITAL_COMMUNITY): Payer: Self-pay | Admitting: Emergency Medicine

## 2019-09-29 NOTE — Telephone Encounter (Signed)
Reaching out to patient to offer assistance regarding upcoming cardiac imaging study; pt verbalizes understanding of appt date/time, parking situation and where to check in, pre-test NPO status and medications ordered, and verified current allergies; name and call back number provided for further questions should they arise Ottavio Norem RN Navigator Cardiac Imaging [facility] [phone] office [phone] cell 

## 2019-09-30 ENCOUNTER — Ambulatory Visit (HOSPITAL_COMMUNITY)
Admission: RE | Admit: 2019-09-30 | Discharge: 2019-09-30 | Disposition: A | Discharge status: Home / Self Care | Payer: Medicare Other | Source: Ambulatory Visit | Attending: Cardiology | Admitting: Cardiology

## 2019-09-30 ENCOUNTER — Other Ambulatory Visit: Payer: Self-pay

## 2019-09-30 ENCOUNTER — Encounter (HOSPITAL_COMMUNITY): Payer: Self-pay

## 2019-09-30 DIAGNOSIS — I4819 Other persistent atrial fibrillation: Secondary | ICD-10-CM | POA: Insufficient documentation

## 2019-09-30 DIAGNOSIS — I4891 Unspecified atrial fibrillation: Secondary | ICD-10-CM

## 2019-09-30 MED ORDER — IOHEXOL 350 MG/ML SOLN
80.0000 mL | Freq: Once | INTRAVENOUS | Status: AC | PRN
  Administered 2019-09-30: 14:00:00 80 mL via INTRAVENOUS

## 2019-09-30 MED ORDER — FLUTICASONE PROPIONATE 50 MCG/ACT NA SUSP: 1.0000 | Freq: Every day | NASAL | 3 refills | Status: DC | PRN

## 2019-10-01 ENCOUNTER — Other Ambulatory Visit (HOSPITAL_COMMUNITY)
Admission: RE | Admit: 2019-10-01 | Discharge: 2019-10-01 | Disposition: A | Discharge status: Home / Self Care | Payer: Medicare Other | Source: Ambulatory Visit | Attending: Cardiology | Admitting: Cardiology

## 2019-10-01 DIAGNOSIS — Z01812 Encounter for preprocedural laboratory examination: Secondary | ICD-10-CM | POA: Insufficient documentation

## 2019-10-01 DIAGNOSIS — Z20822 Contact with and (suspected) exposure to covid-19: Secondary | ICD-10-CM | POA: Insufficient documentation

## 2019-10-01 LAB — SARS CORONAVIRUS 2 (TAT 6-24 HRS): SARS Coronavirus 2: NEGATIVE

## 2019-10-05 ENCOUNTER — Telehealth (HOSPITAL_COMMUNITY): Payer: Self-pay | Admitting: Emergency Medicine

## 2019-10-05 NOTE — Telephone Encounter (Signed)
Pt calling with medication questions, I encouraged him to continue medications per usual today but will HOLD the day of the procedure. ( I referred back to instructions provided by the office).  Encouraged patient to call back with further questions if any  Marchia Bond RN Navigator Cardiac [facility] [phone] Office  [phone] Cell.

## 2019-10-05 NOTE — Anesthesia Preprocedure Evaluation (Addendum)
Anesthesia Evaluation  Patient identified by MRN, date of birth, ID band Patient awake    Reviewed: Allergy & Precautions, NPO status , Patient's Chart, lab work & pertinent test results  History of Anesthesia Complications Negative for: history of anesthetic complications  Airway Mallampati: II  TM Distance: >3 FB Neck ROM: Full    Dental  (+) Dental Advisory Given   Pulmonary neg pulmonary ROS,  10/01/2019 SARS coronavirus NEG   breath sounds clear to auscultation       Cardiovascular hypertension, Pt. on medications (-) angina+ dysrhythmias Atrial Fibrillation  Rhythm:Irregular Rate:Normal  '18 ECHO: EF 60-65%, mild-mod MR   Neuro/Psych negative neurological ROS     GI/Hepatic Neg liver ROS, GERD  Controlled,  Endo/Other  Hypothyroidism   Renal/GU negative Renal ROS     Musculoskeletal   Abdominal   Peds  Hematology eliquis   Anesthesia Other Findings   Reproductive/Obstetrics                            Anesthesia Physical Anesthesia Plan  ASA: III  Anesthesia Plan: General   Post-op Pain Management:    Induction: Intravenous  PONV Risk Score and Plan: 2 and Ondansetron and Dexamethasone  Airway Management Planned: Oral ETT  Additional Equipment:   Intra-op Plan:   Post-operative Plan: Extubation in OR  Informed Consent: I have reviewed the patients History and Physical, chart, labs and discussed the procedure including the risks, benefits and alternatives for the proposed anesthesia with the patient or authorized representative who has indicated his/her understanding and acceptance.     Dental advisory given  Plan Discussed with: CRNA and Surgeon  Anesthesia Plan Comments:        Anesthesia Quick Evaluation

## 2019-10-06 ENCOUNTER — Other Ambulatory Visit: Payer: Self-pay

## 2019-10-06 ENCOUNTER — Encounter (HOSPITAL_COMMUNITY): Payer: Self-pay | Admitting: Cardiology

## 2019-10-06 ENCOUNTER — Ambulatory Visit (HOSPITAL_COMMUNITY)
Admission: RE | Admit: 2019-10-06 | Discharge: 2019-10-06 | Disposition: A | Discharge status: Home / Self Care | Payer: Medicare Other | Attending: Cardiology | Admitting: Cardiology

## 2019-10-06 ENCOUNTER — Ambulatory Visit (HOSPITAL_COMMUNITY): Payer: Medicare Other | Admitting: Anesthesiology

## 2019-10-06 ENCOUNTER — Encounter (HOSPITAL_COMMUNITY)
Admission: RE | Disposition: A | Discharge status: Home / Self Care | Payer: Self-pay | Source: Home / Self Care | Attending: Cardiology

## 2019-10-06 DIAGNOSIS — Z8546 Personal history of malignant neoplasm of prostate: Secondary | ICD-10-CM | POA: Diagnosis not present

## 2019-10-06 DIAGNOSIS — Z7901 Long term (current) use of anticoagulants: Secondary | ICD-10-CM | POA: Diagnosis not present

## 2019-10-06 DIAGNOSIS — E039 Hypothyroidism, unspecified: Secondary | ICD-10-CM | POA: Insufficient documentation

## 2019-10-06 DIAGNOSIS — N189 Chronic kidney disease, unspecified: Secondary | ICD-10-CM | POA: Insufficient documentation

## 2019-10-06 DIAGNOSIS — Z79899 Other long term (current) drug therapy: Secondary | ICD-10-CM | POA: Diagnosis not present

## 2019-10-06 DIAGNOSIS — I129 Hypertensive chronic kidney disease with stage 1 through stage 4 chronic kidney disease, or unspecified chronic kidney disease: Secondary | ICD-10-CM | POA: Insufficient documentation

## 2019-10-06 DIAGNOSIS — K219 Gastro-esophageal reflux disease without esophagitis: Secondary | ICD-10-CM | POA: Diagnosis not present

## 2019-10-06 DIAGNOSIS — G629 Polyneuropathy, unspecified: Secondary | ICD-10-CM | POA: Insufficient documentation

## 2019-10-06 DIAGNOSIS — I4819 Other persistent atrial fibrillation: Secondary | ICD-10-CM | POA: Insufficient documentation

## 2019-10-06 DIAGNOSIS — I483 Typical atrial flutter: Secondary | ICD-10-CM | POA: Diagnosis not present

## 2019-10-06 DIAGNOSIS — R1310 Dysphagia, unspecified: Secondary | ICD-10-CM | POA: Diagnosis not present

## 2019-10-06 DIAGNOSIS — Z79890 Hormone replacement therapy (postmenopausal): Secondary | ICD-10-CM | POA: Insufficient documentation

## 2019-10-06 DIAGNOSIS — I4891 Unspecified atrial fibrillation: Secondary | ICD-10-CM | POA: Diagnosis not present

## 2019-10-06 DIAGNOSIS — E785 Hyperlipidemia, unspecified: Secondary | ICD-10-CM | POA: Insufficient documentation

## 2019-10-06 DIAGNOSIS — N182 Chronic kidney disease, stage 2 (mild): Secondary | ICD-10-CM | POA: Diagnosis not present

## 2019-10-06 HISTORY — PX: ATRIAL FIBRILLATION ABLATION: EP1191

## 2019-10-06 LAB — POCT ACTIVATED CLOTTING TIME
Activated Clotting Time: 334 seconds
Activated Clotting Time: 340 seconds
Activated Clotting Time: 312 seconds
Activated Clotting Time: 323 seconds
Activated Clotting Time: 340 seconds

## 2019-10-06 SURGERY — ATRIAL FIBRILLATION ABLATION
Anesthesia: General

## 2019-10-06 MED ORDER — ROCURONIUM BROMIDE 50 MG/5ML IV SOSY
PREFILLED_SYRINGE | INTRAVENOUS | Status: DC | PRN
  Administered 2019-10-06: 60 mg via INTRAVENOUS

## 2019-10-06 MED ORDER — ONDANSETRON HCL 4 MG/2ML IJ SOLN
INTRAMUSCULAR | Status: DC | PRN
  Administered 2019-10-06: 4 mg via INTRAVENOUS

## 2019-10-06 MED ORDER — HEPARIN (PORCINE) IN NACL 1000-0.9 UT/500ML-% IV SOLN
INTRAVENOUS | Status: DC | PRN
  Administered 2019-10-06 (×5): 500 mL

## 2019-10-06 MED ORDER — DOBUTAMINE IN D5W 4-5 MG/ML-% IV SOLN
INTRAVENOUS | Status: DC | PRN
  Administered 2019-10-06: 20 ug/kg/min via INTRAVENOUS

## 2019-10-06 MED ORDER — HEPARIN (PORCINE) IN NACL 1000-0.9 UT/500ML-% IV SOLN
INTRAVENOUS | Status: AC
  Filled 2019-10-06: qty 2500

## 2019-10-06 MED ORDER — PHENYLEPHRINE 40 MCG/ML (10ML) SYRINGE FOR IV PUSH (FOR BLOOD PRESSURE SUPPORT)
PREFILLED_SYRINGE | INTRAVENOUS | Status: DC | PRN
  Administered 2019-10-06 (×2): 120 ug via INTRAVENOUS

## 2019-10-06 MED ORDER — ACETAMINOPHEN 325 MG PO TABS
650.0000 mg | ORAL_TABLET | ORAL | Status: DC | PRN
  Administered 2019-10-06: 650 mg via ORAL
  Filled 2019-10-06: qty 2

## 2019-10-06 MED ORDER — SODIUM CHLORIDE 0.9% FLUSH: 3.0000 mL | INTRAVENOUS | Status: DC | PRN

## 2019-10-06 MED ORDER — SODIUM CHLORIDE 0.9 % IV SOLN: INTRAVENOUS | Status: DC

## 2019-10-06 MED ORDER — PROPOFOL 10 MG/ML IV BOLUS
INTRAVENOUS | Status: DC | PRN
  Administered 2019-10-06: 10 mg via INTRAVENOUS
  Administered 2019-10-06: 120 mg via INTRAVENOUS

## 2019-10-06 MED ORDER — PROTAMINE SULFATE 10 MG/ML IV SOLN
INTRAVENOUS | Status: DC | PRN
  Administered 2019-10-06: 50 mg via INTRAVENOUS

## 2019-10-06 MED ORDER — FENTANYL CITRATE (PF) 250 MCG/5ML IJ SOLN
INTRAMUSCULAR | Status: DC | PRN
  Administered 2019-10-06: 100 ug via INTRAVENOUS
  Administered 2019-10-06: 25 ug via INTRAVENOUS

## 2019-10-06 MED ORDER — PHENYLEPHRINE HCL-NACL 10-0.9 MG/250ML-% IV SOLN
INTRAVENOUS | Status: DC | PRN
  Administered 2019-10-06: 25 ug/min via INTRAVENOUS

## 2019-10-06 MED ORDER — EPHEDRINE SULFATE-NACL 50-0.9 MG/10ML-% IV SOSY
PREFILLED_SYRINGE | INTRAVENOUS | Status: DC | PRN
  Administered 2019-10-06: 5 mg via INTRAVENOUS
  Administered 2019-10-06: 10 mg via INTRAVENOUS
  Administered 2019-10-06: 5 mg via INTRAVENOUS

## 2019-10-06 MED ORDER — HEPARIN SODIUM (PORCINE) 1000 UNIT/ML IJ SOLN
INTRAMUSCULAR | Status: DC | PRN
  Administered 2019-10-06: 1000 [IU] via INTRAVENOUS

## 2019-10-06 MED ORDER — ONDANSETRON HCL 4 MG/2ML IJ SOLN: 4.0000 mg | Freq: Four times a day (QID) | INTRAMUSCULAR | Status: DC | PRN

## 2019-10-06 MED ORDER — HEPARIN SODIUM (PORCINE) 1000 UNIT/ML IJ SOLN
INTRAMUSCULAR | Status: AC
  Filled 2019-10-06: qty 1

## 2019-10-06 MED ORDER — SODIUM CHLORIDE 0.9 % IV SOLN: 250.0000 mL | INTRAVENOUS | Status: DC | PRN

## 2019-10-06 MED ORDER — DOBUTAMINE IN D5W 4-5 MG/ML-% IV SOLN
INTRAVENOUS | Status: AC
  Filled 2019-10-06: qty 250

## 2019-10-06 MED ORDER — HEPARIN SODIUM (PORCINE) 1000 UNIT/ML IJ SOLN
INTRAMUSCULAR | Status: DC | PRN
  Administered 2019-10-06: 14000 [IU] via INTRAVENOUS
  Administered 2019-10-06: 2000 [IU] via INTRAVENOUS
  Administered 2019-10-06 (×4): 1000 [IU] via INTRAVENOUS

## 2019-10-06 MED ORDER — DEXAMETHASONE SODIUM PHOSPHATE 10 MG/ML IJ SOLN
INTRAMUSCULAR | Status: DC | PRN
  Administered 2019-10-06: 10 mg via INTRAVENOUS

## 2019-10-06 MED ORDER — LIDOCAINE 2% (20 MG/ML) 5 ML SYRINGE
INTRAMUSCULAR | Status: DC | PRN
  Administered 2019-10-06: 40 mg via INTRAVENOUS

## 2019-10-06 MED ORDER — SODIUM CHLORIDE 0.9% FLUSH: 3.0000 mL | Freq: Two times a day (BID) | INTRAVENOUS | Status: DC

## 2019-10-06 SURGICAL SUPPLY — 23 items
BAG SNAP BAND KOVER 36X36 (MISCELLANEOUS) ×3 IMPLANT
BLANKET WARM UNDERBOD FULL ACC (MISCELLANEOUS) ×3 IMPLANT
CATH 8FR REPROCESSED SOUNDSTAR (CATHETERS) ×3 IMPLANT
CATH MAPPNG PENTARAY F 2-6-2MM (CATHETERS) ×1 IMPLANT
CATH SMTCH THERMOCOOL SF DF (CATHETERS) ×3 IMPLANT
CATH WEBSTER BI DIR CS D-F CRV (CATHETERS) ×3 IMPLANT
COVER SWIFTLINK CONNECTOR (BAG) ×3 IMPLANT
DEVICE CLOSURE PERCLS PRGLD 6F (VASCULAR PRODUCTS) ×4 IMPLANT
PACK EP LATEX FREE (CUSTOM PROCEDURE TRAY) ×3
PACK EP LF (CUSTOM PROCEDURE TRAY) ×1 IMPLANT
PAD PRO RADIOLUCENT 2001M-C (PAD) ×3 IMPLANT
PATCH CARTO3 (PAD) ×3 IMPLANT
PENTARAY F 2-6-2MM (CATHETERS) ×3
PERCLOSE PROGLIDE 6F (VASCULAR PRODUCTS) ×12
SHEATH BAYLIS SUREFLEX  M 8.5 (SHEATH) ×3
SHEATH BAYLIS SUREFLEX M 8.5 (SHEATH) ×1 IMPLANT
SHEATH BAYLIS TRANSSEPTAL 98CM (NEEDLE) ×3 IMPLANT
SHEATH CARTO VIZIGO SM CVD (SHEATH) ×3 IMPLANT
SHEATH PINNACLE 7F 10CM (SHEATH) ×3 IMPLANT
SHEATH PINNACLE 8F 10CM (SHEATH) ×6 IMPLANT
SHEATH PINNACLE 9F 10CM (SHEATH) ×3 IMPLANT
SHEATH PROBE COVER 6X72 (BAG) ×6 IMPLANT
TUBING SMART ABLATE COOLFLOW (TUBING) ×3 IMPLANT

## 2019-10-06 NOTE — H&P (Signed)
Meet Brent Wilkins has presented today for surgery, with the diagnosis of atrial fibrillation.  The various methods of treatment have been discussed with the patient and family. After consideration of risks, benefits and other options for treatment, the patient has consented to  Procedure(s): Catheter ablation as a surgical intervention .  Risks include but not limited to bleeding, tamponade, heart block, stroke, damage to surrounding organs, among others. The patient's history has been reviewed, patient examined, no change in status, stable for surgery.  I have reviewed the patient's chart and labs.  Questions were answered to the patient's satisfaction.    Gwenevere Goga Curt Bears, MD 10/06/2019 8:09 AM

## 2019-10-06 NOTE — Anesthesia Postprocedure Evaluation (Signed)
Anesthesia Post Note  Patient: Brent Wilkins  Procedure(s) Performed: ATRIAL FIBRILLATION ABLATION (N/A )     Patient location during evaluation: Phase II Anesthesia Type: General Level of consciousness: awake and alert, patient cooperative and oriented Pain management: pain level controlled Vital Signs Assessment: post-procedure vital signs reviewed and stable Respiratory status: spontaneous breathing, nonlabored ventilation and respiratory function stable Cardiovascular status: blood pressure returned to baseline and stable Postop Assessment: no apparent nausea or vomiting and adequate PO intake Anesthetic complications: no    Last Vitals:  Vitals:   10/06/19 1454 10/06/19 1524  BP: (!) 151/78 (!) 156/76  Pulse: 76 78  Resp: 13 14  Temp:    SpO2: 96% 96%    Last Pain:  Vitals:   10/06/19 1600  TempSrc:   PainSc: 3                  Keena Heesch,E. Rainah Kirshner

## 2019-10-06 NOTE — Progress Notes (Signed)
Up and walked and tolerated well; bilat groins stable, no bleeding or hematoma 

## 2019-10-06 NOTE — Progress Notes (Signed)
Report received from Corwin Levins, RN

## 2019-10-06 NOTE — Discharge Instructions (Signed)
Post procedure care instructions No driving for 4 days. No lifting over 5 lbs for 1 week. No vigorous or sexual activity for 1 week. You may return to work/your usual activities on 10/13/2019. Keep procedure site clean & dry. If you notice increased pain, swelling, bleeding or pus, call/return!  You may shower, but no soaking baths/hot tubs/pools for 1 week.   You have an appointment set up with the [facility].  Multiple studies have shown that being followed by a dedicated atrial fibrillation clinic in addition to the standard care you receive from your other physicians improves health. We believe that enrollment in the atrial fibrillation clinic will allow [place] to better care for you.   The phone number to the [facility] is [phone]. The clinic is staffed Monday through Friday from 8:30am to 5pm.  Parking Directions: The clinic is located in the Heart and Vascular Building connected to [facility]. 1)From [address] turn on to [address] and go to the 3rd entrance  (Heart and Vascular entrance) on the right. 2)Look to the right for Heart &Vascular Parking Garage. 3)A code for the entrance is required please call the clinic to receive this.   4)Take the elevators to the 1st floor. Registration is in the room with the glass walls at the end of the hallway.  If you have any trouble parking or locating the clinic, please don't hesitate to call [phone]. Cardiac Ablation, Care After This sheet gives you information about how to care for yourself after your procedure. Your health care provider may also give you more specific instructions. If you have problems or questions, contact your health care provider. What can I expect after the procedure? After the procedure, it is common to have:  Bruising around your puncture site.  Tenderness around your puncture site.  Skipped heartbeats.  Tiredness (fatigue). Follow these instructions at  home: Puncture site care   Follow instructions from your health care provider about how to take care of your puncture site. Make sure you: ? Wash your hands with soap and water before you change your bandage (dressing). If soap and water are not available, use hand sanitizer. ? Change your dressing as told by your health care provider. ? Leave stitches (sutures), skin glue, or adhesive strips in place. These skin closures may need to stay in place for up to 2 weeks. If adhesive strip edges start to loosen and curl up, you may trim the loose edges. Do not remove adhesive strips completely unless your health care provider tells you to do that.  Check your puncture site every day for signs of infection. Check for: ? Redness, swelling, or pain. ? Fluid or blood. If your puncture site starts to bleed, lie down on your back, apply firm pressure to the area, and contact your health care provider. ? Warmth. ? Pus or a bad smell. Driving  Ask your health care provider when it is safe for you to drive again after the procedure.  Do not drive or use heavy machinery while taking prescription pain medicine.  Do not drive for 24 hours if you were given a medicine to help you relax (sedative) during your procedure. Activity  Avoid activities that take a lot of effort for at least 3 days after your procedure.  Do not lift anything that is heavier than 10 lb (4.5 kg), or the limit that you are told, until your health care provider says that it is safe.  Return to  your normal activities as told by your health care provider. Ask your health care provider what activities are safe for you. General instructions  Take over-the-counter and prescription medicines only as told by your health care provider.  Do not use any products that contain nicotine or tobacco, such as cigarettes and e-cigarettes. If you need help quitting, ask your health care provider.  Do not take baths, swim, or use a hot tub until  your health care provider approves.  Do not drink alcohol for 24 hours after your procedure.  Keep all follow-up visits as told by your health care provider. This is important. Contact a health care provider if:  You have redness, mild swelling, or pain around your puncture site.  You have fluid or blood coming from your puncture site that stops after applying firm pressure to the area.  Your puncture site feels warm to the touch.  You have pus or a bad smell coming from your puncture site.  You have a fever.  You have chest pain or discomfort that spreads to your neck, jaw, or arm.  You are sweating a lot.  You feel nauseous.  You have a fast or irregular heartbeat.  You have shortness of breath.  You are dizzy or light-headed and feel the need to lie down.  You have pain or numbness in the arm or leg closest to your puncture site. Get help right away if:  Your puncture site suddenly swells.  Your puncture site is bleeding and the bleeding does not stop after applying firm pressure to the area. These symptoms may represent a serious problem that is an emergency. Do not wait to see if the symptoms will go away. Get medical help right away. Call your local emergency services (911 in the [place] Do not drive yourself to the hospital. Summary  After the procedure, it is normal to have bruising and tenderness at the puncture site in your groin, neck, or forearm.  Check your puncture site every day for signs of infection.  Get help right away if your puncture site is bleeding and the bleeding does not stop after applying firm pressure to the area. This is a medical emergency. This information is not intended to replace advice given to you by your health care provider. Make sure you discuss any questions you have with your health care provider. Document Revised: 07/04/2017 Document Reviewed: 10/31/2016 Elsevier Patient Education  [place].

## 2019-10-06 NOTE — Anesthesia Procedure Notes (Signed)
Procedure Name: Intubation Date/Time: 10/06/2019 8:54 AM Performed by: Shirlyn Goltz, CRNA Pre-anesthesia Checklist: Patient identified, Emergency Drugs available, Suction available and Patient being monitored Patient Re-evaluated:Patient Re-evaluated prior to induction Oxygen Delivery Method: Circle system utilized Preoxygenation: Pre-oxygenation with 100% oxygen Induction Type: IV induction Ventilation: Mask ventilation without difficulty Laryngoscope Size: Glidescope and 4 Grade View: Grade I Tube type: Oral Tube size: 7.5 mm Number of attempts: 1 Airway Equipment and Method: Stylet Placement Confirmation: ETT inserted through vocal cords under direct vision,  positive ETCO2 and breath sounds checked- equal and bilateral Secured at: 21 cm Tube secured with: Tape Dental Injury: Teeth and Oropharynx as per pre-operative assessment

## 2019-10-06 NOTE — Transfer of Care (Signed)
Immediate Anesthesia Transfer of Care Note  Patient: Brent Wilkins  Procedure(s) Performed: ATRIAL FIBRILLATION ABLATION (N/A )  Patient Location: PACU and Cath Lab  Anesthesia Type:General  Level of Consciousness: awake, patient cooperative and responds to stimulation  Airway & Oxygen Therapy: Patient Spontanous Breathing and Patient connected to nasal cannula oxygen  Post-op Assessment: Report given to RN, Post -op Vital signs reviewed and stable and Patient moving all extremities X 4  Post vital signs: Reviewed and stable  Last Vitals:  Vitals Value Taken Time  BP 126/63 10/06/19 1228  Temp    Pulse 80 10/06/19 1230  Resp 21 10/06/19 1230  SpO2 94 % 10/06/19 1230  Vitals shown include unvalidated device data.  Last Pain:  Vitals:   10/06/19 0720  TempSrc:   PainSc: 0-No pain         Complications: No apparent anesthesia complications

## 2019-10-14 ENCOUNTER — Telehealth: Payer: Self-pay | Admitting: Internal Medicine

## 2019-10-14 NOTE — Telephone Encounter (Signed)
Spoke with [facility] and they needed EGs supervising physician, which is RMR. Info given.

## 2019-10-14 NOTE — Telephone Encounter (Signed)
PLEASE CALL [facility] REGARDING A PRESCRIPTION FOR THE PATIENT   [phone]    REFERENCE [id number]

## 2019-10-19 DIAGNOSIS — E785 Hyperlipidemia, unspecified: Secondary | ICD-10-CM | POA: Diagnosis not present

## 2019-10-19 DIAGNOSIS — I4819 Other persistent atrial fibrillation: Secondary | ICD-10-CM | POA: Diagnosis not present

## 2019-10-19 DIAGNOSIS — I10 Essential (primary) hypertension: Secondary | ICD-10-CM | POA: Diagnosis not present

## 2019-10-19 DIAGNOSIS — R739 Hyperglycemia, unspecified: Secondary | ICD-10-CM | POA: Diagnosis not present

## 2019-10-20 LAB — LIPID PANEL
Cholesterol: 168 mg/dL (ref <200)
HDL: 41 mg/dL (ref >=40)
LDL Cholesterol (Calc): 101 mg/dL (calc) — ABNORMAL HIGH
Non-HDL Cholesterol (Calc): 127 mg/dL (calc) (ref <130)
Total CHOL/HDL Ratio: 4.1 (calc) (ref <5.0)
Triglycerides: 160 mg/dL — ABNORMAL HIGH (ref <150)

## 2019-10-20 LAB — COMPLETE METABOLIC PANEL WITH GFR
AG Ratio: 1.2 (calc) (ref 1.0–2.5)
ALT: 9 U/L (ref 9–46)
AST: 16 U/L (ref 10–35)
Albumin: 3.5 g/dL — ABNORMAL LOW (ref 3.6–5.1)
Alkaline phosphatase (APISO): 75 U/L (ref 35–144)
BUN: 13 mg/dL (ref 7–25)
CO2: 26 mmol/L (ref 20–32)
Calcium: 9.4 mg/dL (ref 8.6–10.3)
Chloride: 108 mmol/L (ref 98–110)
Creat: 1.06 mg/dL (ref 0.70–1.11)
GFR, Est African American: 76 mL/min/{1.73_m2} (ref >=60)
GFR, Est Non African American: 66 mL/min/{1.73_m2} (ref >=60)
Globulin: 2.9 g/dL (calc) (ref 1.9–3.7)
Glucose, Bld: 96 mg/dL (ref 65–99)
Potassium: 4.5 mmol/L (ref 3.5–5.3)
Sodium: 143 mmol/L (ref 135–146)
Total Bilirubin: 0.5 mg/dL (ref 0.2–1.2)
Total Protein: 6.4 g/dL (ref 6.1–8.1)

## 2019-10-20 LAB — CBC
HCT: 40.1 % (ref 38.5–50.0)
Hemoglobin: 13 g/dL — ABNORMAL LOW (ref 13.2–17.1)
MCH: 28.1 pg (ref 27.0–33.0)
MCHC: 32.4 g/dL (ref 32.0–36.0)
MCV: 86.6 fL (ref 80.0–100.0)
MPV: 8.9 fL (ref 7.5–12.5)
Platelets: 294 10*3/uL (ref 140–400)
RBC: 4.63 10*6/uL (ref 4.20–5.80)
RDW: 13.9 % (ref 11.0–15.0)
WBC: 6.3 10*3/uL (ref 3.8–10.8)

## 2019-10-20 LAB — HEMOGLOBIN A1C
Hgb A1c MFr Bld: 5.8 % of total Hgb — ABNORMAL HIGH (ref <5.7)
Mean Plasma Glucose: 120 (calc)
eAG (mmol/L): 6.6 (calc)

## 2019-10-20 LAB — TSH: TSH: 2.68 mIU/L (ref 0.40–4.50)

## 2019-10-26 ENCOUNTER — Ambulatory Visit (INDEPENDENT_AMBULATORY_CARE_PROVIDER_SITE_OTHER): Payer: Medicare Other | Admitting: Family Medicine

## 2019-10-26 ENCOUNTER — Encounter: Payer: Self-pay | Admitting: Family Medicine

## 2019-10-26 ENCOUNTER — Other Ambulatory Visit: Payer: Self-pay

## 2019-10-26 VITALS — BP 134/76 | HR 72 | Temp 97.6°F | Resp 15 | Ht 70.0 in | Wt 194.0 lb

## 2019-10-26 DIAGNOSIS — E785 Hyperlipidemia, unspecified: Secondary | ICD-10-CM

## 2019-10-26 DIAGNOSIS — G250 Essential tremor: Secondary | ICD-10-CM

## 2019-10-26 DIAGNOSIS — I10 Essential (primary) hypertension: Secondary | ICD-10-CM

## 2019-10-26 DIAGNOSIS — J3089 Other allergic rhinitis: Secondary | ICD-10-CM | POA: Diagnosis not present

## 2019-10-26 HISTORY — DX: Hyperlipidemia, unspecified: E78.5

## 2019-10-26 NOTE — Assessment & Plan Note (Signed)
Brent Wilkins is encouraged to maintain a well balanced diet that is low in salt. Controlled, continue current medication regimen.  No refills needed.  Additionally, he is also reminded that exercise is beneficial for heart health and control of  Blood pressure. 30-60 minutes daily is recommended-walking was suggested.

## 2019-10-26 NOTE — Assessment & Plan Note (Signed)
This has worsened but after reviewing the medications and their side effect profile joint treatment plan would be to increase activity level and see if that helps with some of the nerve irritation and close follow-up.  Advised for him to be intentional mindful of when he is lifting up things especially if things are more fragile. Patient acknowledged agreement and understanding of the plan.

## 2019-10-26 NOTE — Assessment & Plan Note (Signed)
Continue to do Flonase and Claritin.  Might need to do ENT in the near future.  But at this time he reports some control.  Does have nose bleeds easily educated on not blowing his nose if he can get around this as he is on Eliquis.

## 2019-10-26 NOTE — Patient Instructions (Addendum)
I appreciate the opportunity to provide you with care for your health and wellness. Today we discussed: overall health   Follow up: Sept for annual same  Lab in 3 months   Please print copy of recent labs for him to take.  GOALS: Work to increase activity as tolerated   Revisit tremor if not getting better with more activity   Please continue to practice social distancing to keep you, your family, and our community safe.  If you must go out, please wear a mask and practice good handwashing.  It was a pleasure to see you and I look forward to continuing to work together on your health and well-being. Please do not hesitate to call the office if you need care or have questions about your care.  Have a wonderful day and week. With Gratitude, Cherly Beach, DNP, AGNP-BC

## 2019-10-26 NOTE — Assessment & Plan Note (Signed)
Elevated cholesterol and triglyceride levels.  Currently on fish oil has a sensitivity to statins.  Might need a other agent such as triglyceride lowering agent.  Would like to keep his levels as controlled as possible given his heart history.  We will be rechecking in 3 months to see if they have elevated until then I have encouraged him to maintain a low-fat diet.  And exercise daily as tolerated

## 2019-10-26 NOTE — Progress Notes (Signed)
Subjective:  Patient ID: Brent Wilkins, male    DOB: 1941-04-25  Age: 79 y.o. MRN: [id number]  CC: No chief complaint on file.     HPI  HPI  Brent Wilkins is an 79 year old male patient of mine.  He presents today for follow-up on lab work, in addition he has had a few other concerns that he just wants to mention today.  Chest catching/pulling.  After his ablation several weeks ago he reports that he developed right-sided very specific angular right under the right nipple area in the rib cage chest discomfort.  Last only a few seconds almost acts like a grabbed for a quick discomfort catch.  Reports that if he moves the wrong way sneezes or coughs or rollovers in bed that is what aggravates it.  He reports being still helps relieve it.  It is a 6 out of 10 when it is hurting and then it subsides quickly.  Denies having any issues of this before.  Denies having any trauma or injury to the site.  Reports that it is not like when he had chest soreness after coughing so much earlier this year with sinusitis.  He denies having any pain with taking a deep breath.  Denies have any shortness of breath or cough.  Denies having any trouble breathing or getting air in.  Has not talked to his cardiologist about this.  Blood pressure at home has been averaging between 1 30-1 40 over the 60s to 70s.  Denies having any chest pain, shortness of breath, leg swelling, dizziness, headaches or vision changes.  Reports doing much better and having increase in energy since his ablation.  Outside of the discomfort as described above he has done well.  Reports that he has not been eating the best diet but they have been refraining from salt.  But they have had increase in cholesterol-like foods as his lab work demonstrated.  He has an allergy to statins.  Is currently taking fish oil.  Would like to have 3 months to see if he can get his diet back under control before starting a triglyceride reduction medication.  Of  note he like to mention that he has a left nare that bleeds rather easily.  He does have significant allergies and is on Eliquis and that creates an easy quick bleed when he blows his nose or tries to clear his airway.  He reports he is able to get under control after some time but it can be little bothersome but wanted to just mention this.  Last he would like to mention that his tremor in his hands is gotten a lot worse.  He had meningitis when he was younger and he developed a tremor at that time.  He reports that he feels like is gotten worse in the last couple months probably from being an active per him.  At this time when questioned he would not like to do any medications as he was described side effect profile of all the medications and is wanting to see if increasing his activity level will help.    Both him and his wife did get the COVID vaccines.    Today patient denies signs and symptoms of COVID 19 infection including fever, chills, cough, shortness of breath, and headache. Past Medical, Surgical, Social History, Allergies, and Medications have been Reviewed.   Past Medical History:  Diagnosis Date  . Allergy    hay fever;  seasonal  . Arthritis  right ankle  . Cataract    surgery 2016  . Chronic kidney disease    stones many years ago  . Dizziness 12/13/2015  . Dysphagia   . Family history of malignant neoplasm of prostate 12/12/2017  . Frequent urination at night   . GERD (gastroesophageal reflux disease)   . History of kidney stones   . History of prostate cancer 06/16/2017  . HTN (hypertension)   . Hyperlipidemia LDL goal <100 10/26/2019  . Hypothyroidism   . 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 Neuropathy    Bilateral ankles  . Paroxysmal atrial fibrillation (HCC)    a. on Tikosyn and Eliquis  . Polyneuropathy 06/16/2017  . Prostate cancer [facility]) 2010   Dr. Rosana Hoes  . Schatzki's ring 10/22/2011  . Seasonal allergies   . Syncope  dx. 8 yrs ago  . Vaccine  counseling 09/15/2019    Current Meds  Medication Sig  . amLODipine (NORVASC) 5 MG tablet TAKE 1 TABLET BY MOUTH  DAILY (Patient taking differently: Take 5 mg by mouth every evening. )  . celecoxib (CELEBREX) 200 MG capsule Take 200 mg by mouth daily as needed for mild pain.   . diphenhydramine-acetaminophen (TYLENOL PM) 25-500 MG TABS tablet Take 1 tablet by mouth at bedtime as needed (sleep and mild discomfort).   . docusate sodium (COLACE) 100 MG capsule Take 100 mg by mouth as needed for mild constipation.  . dofetilide (TIKOSYN) 500 MCG capsule Take 1 capsule (500 mcg total) by mouth 2 (two) times daily.  Marland Kitchen ELIQUIS 5 MG TABS tablet TAKE 1 TABLET BY MOUTH TWO  TIMES DAILY (Patient taking differently: Take 5 mg by mouth 2 (two) times daily. )  . finasteride (PROSCAR) 5 MG tablet Take 5 mg by mouth daily.  . fluticasone (FLONASE) 50 MCG/ACT nasal spray Place 1 spray into both nostrils daily as needed for allergies.  Marland Kitchen gabapentin (NEURONTIN) 300 MG capsule Take 300 mg by mouth 2 (two) times daily.   Marland Kitchen ibuprofen (ADVIL,MOTRIN) 200 MG tablet Take 400 mg by mouth every 8 (eight) hours as needed for headache (headache/pain.).   Marland Kitchen levothyroxine (SYNTHROID) 75 MCG tablet Take 1 tablet (75 mcg total) by mouth daily.  Marland Kitchen loratadine (CLARITIN) 10 MG tablet Take 10 mg by mouth daily as needed for allergies.  Marland Kitchen losartan (COZAAR) 50 MG tablet TAKE 1 TABLET BY MOUTH  DAILY (Patient taking differently: Take 50 mg by mouth every evening. )  . Omega-3 Fatty Acids (RA FISH OIL) 1400 MG CPDR Take 1,400 mg by mouth daily.  . pantoprazole (PROTONIX) 40 MG tablet TAKE 1 TABLET BY MOUTH  DAILY BEFORE BREAKFAST (Patient taking differently: Take 40 mg by mouth daily. )  . Polyethyl Glycol-Propyl Glycol (SYSTANE) 0.4-0.3 % SOLN Place 1 drop into both eyes 2 (two) times daily as needed (dry eyes).   . Tamsulosin HCl (FLOMAX) 0.4 MG CAPS Take 0.4 mg by mouth daily after breakfast.   . vitamin B-12 (CYANOCOBALAMIN) 1000  MCG tablet Take 1,000 mcg by mouth daily.  . [DISCONTINUED] predniSONE (STERAPRED UNI-PAK 21 TAB) 10 MG (21) TBPK tablet Take as directed    ROS:  Review of Systems  Constitutional: Negative.   HENT: Negative.   Eyes: Negative.   Respiratory: Negative.   Cardiovascular: Negative.   Gastrointestinal: Negative.   Genitourinary: Negative.   Musculoskeletal: Negative.   Skin: Negative.   Neurological: Negative.   Endo/Heme/Allergies: Negative.   Psychiatric/Behavioral: Negative.   All other systems reviewed  and are negative.    Objective:   Today's Vitals: BP 134/76   Pulse 72   Temp 97.6 F (36.4 C) (Temporal)   Resp 15   Ht 5\' 10"  (1.778 m)   Wt 194 lb (88 kg)   SpO2 97%   BMI 27.84 kg/m  Vitals with BMI 10/26/2019 10/26/2019 10/06/2019  Height - 5\' 10"  -  Weight - 194 lbs -  BMI - [id number] -  Systolic [id number]  Diastolic 76 84 76  Pulse - 72 78     Physical Exam Vitals and nursing note reviewed.  Constitutional:      Appearance: Normal appearance. He is overweight.  HENT:     Head: Normocephalic and atraumatic.     Right Ear: External ear normal.     Left Ear: External ear normal.     Mouth/Throat:     Comments: Mask in place Eyes:     General:        Right eye: No discharge.        Left eye: No discharge.     Conjunctiva/sclera: Conjunctivae normal.  Cardiovascular:     Rate and Rhythm: Normal rate and regular rhythm.     Pulses: Normal pulses.     Heart sounds: Normal heart sounds.  Pulmonary:     Effort: Pulmonary effort is normal.     Breath sounds: Normal breath sounds.  Musculoskeletal:     Cervical back: Normal range of motion and neck supple.  Skin:    General: Skin is warm.  Neurological:     General: No focal deficit present.     Mental Status: He is alert and oriented to person, place, and time.     Cranial Nerves: Cranial nerves are intact.     Motor: Tremor present.     Coordination: Coordination is intact.     Gait: Gait is intact.      Comments: Bilateral tremor noted in hands  Psychiatric:        Mood and Affect: Mood normal.        Behavior: Behavior normal.        Thought Content: Thought content normal.        Judgment: Judgment normal.      Assessment   1. HTN, goal below 140/90   2. Hyperlipidemia LDL goal <100   3. Benign essential tremor   4. Environmental and seasonal allergies     Tests ordered Orders Placed This Encounter  Procedures  . COMPLETE METABOLIC PANEL WITH GFR  . CBC  . Lipid panel     Plan: Please see assessment and plan per problem list above.   No orders of the defined types were placed in this encounter.   Patient to follow-up in Sept for annual .  [name], NP

## 2019-11-03 ENCOUNTER — Ambulatory Visit (HOSPITAL_COMMUNITY)
Admission: RE | Admit: 2019-11-03 | Discharge: 2019-11-03 | Disposition: A | Discharge status: Home / Self Care | Payer: Medicare Other | Source: Ambulatory Visit | Attending: Nurse Practitioner | Admitting: Nurse Practitioner

## 2019-11-03 ENCOUNTER — Encounter (HOSPITAL_COMMUNITY): Payer: Self-pay | Admitting: Nurse Practitioner

## 2019-11-03 ENCOUNTER — Other Ambulatory Visit: Payer: Self-pay

## 2019-11-03 VITALS — BP 154/80 | HR 78 | Ht 70.0 in | Wt 196.8 lb

## 2019-11-03 DIAGNOSIS — Z79899 Other long term (current) drug therapy: Secondary | ICD-10-CM | POA: Insufficient documentation

## 2019-11-03 DIAGNOSIS — E785 Hyperlipidemia, unspecified: Secondary | ICD-10-CM | POA: Diagnosis not present

## 2019-11-03 DIAGNOSIS — D6869 Other thrombophilia: Secondary | ICD-10-CM | POA: Diagnosis not present

## 2019-11-03 DIAGNOSIS — Z8249 Family history of ischemic heart disease and other diseases of the circulatory system: Secondary | ICD-10-CM | POA: Diagnosis not present

## 2019-11-03 DIAGNOSIS — K219 Gastro-esophageal reflux disease without esophagitis: Secondary | ICD-10-CM | POA: Diagnosis not present

## 2019-11-03 DIAGNOSIS — E039 Hypothyroidism, unspecified: Secondary | ICD-10-CM | POA: Diagnosis not present

## 2019-11-03 DIAGNOSIS — I4819 Other persistent atrial fibrillation: Secondary | ICD-10-CM | POA: Diagnosis not present

## 2019-11-03 DIAGNOSIS — I129 Hypertensive chronic kidney disease with stage 1 through stage 4 chronic kidney disease, or unspecified chronic kidney disease: Secondary | ICD-10-CM | POA: Insufficient documentation

## 2019-11-03 DIAGNOSIS — N189 Chronic kidney disease, unspecified: Secondary | ICD-10-CM | POA: Insufficient documentation

## 2019-11-03 DIAGNOSIS — M19071 Primary osteoarthritis, right ankle and foot: Secondary | ICD-10-CM | POA: Insufficient documentation

## 2019-11-03 DIAGNOSIS — Z7901 Long term (current) use of anticoagulants: Secondary | ICD-10-CM | POA: Diagnosis not present

## 2019-11-03 LAB — MAGNESIUM: Magnesium: 2 mg/dL (ref 1.7–2.4)

## 2019-11-03 NOTE — Progress Notes (Signed)
Primary Care Physician: [name], NP Referring Physician: Dr. Jonn Shingles Priebe is a 79 y.o. male with a h/o persistent afib, recent breakthrough on tikosyn, that is in the afib clinic f/u ablation. He has enjoyed SR since the procedure. No swallowing or groin issues. He has noted a quick  chest rt lower chest since the procedure, more when he turns over in bed and when he sneezes. It it improving. He continues on eliquis with a CHA2DS2VASc score of at least 3.   Today, he denies symptoms of palpitations, chest pain, shortness of breath, orthopnea, PND, lower extremity edema, dizziness, presyncope, syncope, or neurologic sequela. The patient is tolerating medications without difficulties and is otherwise without complaint today.   Past Medical History:  Diagnosis Date  . Allergy    hay fever;  seasonal  . Arthritis    right ankle  . Cataract    surgery 2016  . Chronic kidney disease    stones many years ago  . Dizziness 12/13/2015  . Dysphagia   . Family history of malignant neoplasm of prostate 12/12/2017  . Frequent urination at night   . GERD (gastroesophageal reflux disease)   . History of kidney stones   . History of prostate cancer 06/16/2017  . HTN (hypertension)   . Hyperlipidemia LDL goal <100 10/26/2019  . Hypothyroidism   . 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 Neuropathy    Bilateral ankles  . Paroxysmal atrial fibrillation (HCC)    a. on Tikosyn and Eliquis  . Polyneuropathy 06/16/2017  . Prostate cancer [facility]) 2010   Dr. Rosana Hoes  . Schatzki's ring 10/22/2011  . Seasonal allergies   . Syncope  dx. 8 yrs ago  . Vaccine counseling 09/15/2019   Past Surgical History:  Procedure Laterality Date  . APPENDECTOMY    . ATRIAL FIBRILLATION ABLATION N/A 10/06/2019   Procedure: ATRIAL FIBRILLATION ABLATION;  Surgeon: Constance Haw, MD;  Location: [place] [facility];  Service: Cardiovascular;  Laterality: N/A;  . CATARACT EXTRACTION W/PHACO Left 03/27/2015   Procedure: CATARACT EXTRACTION PHACO AND INTRAOCULAR LENS PLACEMENT LEFT EYE CDE=11.73;  Surgeon: Tonny Branch, MD;  Location: [facility];  Service: Ophthalmology;  Laterality: Left;  . CATARACT EXTRACTION W/PHACO Right 03/30/2015   Procedure: CATARACT EXTRACTION PHACO AND INTRAOCULAR LENS PLACEMENT RIGHT EYE CDE=7.35;  Surgeon: Tonny Branch, MD;  Location: [facility];  Service: Ophthalmology;  Laterality: Right;  . CHOLECYSTECTOMY    . COLONOSCOPY  07/23/10   Dr. Vivi Ferns rectum, scattered pancolonic diverticula  . COLONOSCOPY  08/10/1999   internal hemorrhoids,inflammatory polyp  . ESOPHAGOGASTRODUODENOSCOPY  04/12/08   Prominent Schatzki's ring, erosive reflux esophagitis, multiple antral erosions, small hiatal hernia, reactive gastropathy, status post dilation with 82F  . ESOPHAGOGASTRODUODENOSCOPY  10/28/2011   Procedure: ESOPHAGOGASTRODUODENOSCOPY (EGD);  Surgeon: Daneil Dolin, MD;  [facility]: [facility];  Service: Endoscopy;  Laterality: N/A;  1:30  . ESOPHAGOGASTRODUODENOSCOPY N/A 09/10/2017   Dr. Gala Romney: Schatkzi's ring s/p dilation at GE junction, mild esophageal reflux esophagitis, medium sized hiatal hernia, normal duodenum. 56 and 72 Fr dilation  . EYE SURGERY    . MALONEY DILATION  10/28/2011   Procedure: Venia Minks DILATION;  Surgeon: Daneil Dolin, MD;  [facility]: [facility];  Service: Endoscopy;  Laterality: N/A;  . Venia Minks DILATION N/A 09/10/2017   Procedure: Venia Minks DILATION;  Surgeon: Daneil Dolin, MD;  [facility];  Service: Endoscopy;  Laterality: N/A;  . SAVORY DILATION  10/28/2011  Procedure: SAVORY DILATION;  Surgeon: Daneil Dolin, MD;  [facility];  Service: Endoscopy;  Laterality: N/A;  . TONSILLECTOMY    . TOTAL ANKLE ARTHROPLASTY Left 11/25/2013   Procedure: TOTAL ANKLE ARTHOPLASTY;  Surgeon: Wylene Simmer, MD;  Location: [place];  Service: Orthopedics;  Laterality: Left;  . TOTAL ANKLE ARTHROPLASTY Right 07/14/2014   dr hewitt  . TOTAL ANKLE  ARTHROPLASTY Right 07/14/2014   Procedure: RIGHT TOTAL ANKLE ARTHOPLASTY;  Surgeon: Wylene Simmer, MD;  Location: [place];  Service: Orthopedics;  Laterality: Right;    Current Outpatient Medications  Medication Sig Dispense Refill  . amLODipine (NORVASC) 5 MG tablet TAKE 1 TABLET BY MOUTH  DAILY (Patient taking differently: Take 5 mg by mouth every evening. ) 90 tablet 3  . celecoxib (CELEBREX) 200 MG capsule Take 200 mg by mouth as needed for mild pain.     . diphenhydramine-acetaminophen (TYLENOL PM) 25-500 MG TABS tablet Take 1 tablet by mouth at bedtime as needed (sleep and mild discomfort).     . docusate sodium (COLACE) 100 MG capsule Take 100 mg by mouth as needed for mild constipation.    . dofetilide (TIKOSYN) 500 MCG capsule Take 1 capsule (500 mcg total) by mouth 2 (two) times daily. 180 capsule 2  . ELIQUIS 5 MG TABS tablet TAKE 1 TABLET BY MOUTH TWO  TIMES DAILY 180 tablet 2  . finasteride (PROSCAR) 5 MG tablet Take 5 mg by mouth daily.    . fluticasone (FLONASE) 50 MCG/ACT nasal spray Place 1 spray into both nostrils daily as needed for allergies. 48 g 3  . gabapentin (NEURONTIN) 300 MG capsule Take 300 mg by mouth 2 (two) times daily.     Marland Kitchen ibuprofen (ADVIL,MOTRIN) 200 MG tablet Take 400 mg by mouth as needed for headache (headache/pain.).     Marland Kitchen levothyroxine (SYNTHROID) 75 MCG tablet Take 1 tablet (75 mcg total) by mouth daily. 90 tablet 1  . loratadine (CLARITIN) 10 MG tablet Take 10 mg by mouth daily as needed for allergies.    Marland Kitchen losartan (COZAAR) 50 MG tablet TAKE 1 TABLET BY MOUTH  DAILY (Patient taking differently: Take 50 mg by mouth every evening. ) 90 tablet 3  . Omega-3 Fatty Acids (RA FISH OIL) 1400 MG CPDR Take 1,400 mg by mouth daily.    . pantoprazole (PROTONIX) 40 MG tablet TAKE 1 TABLET BY MOUTH  DAILY BEFORE BREAKFAST (Patient taking differently: Take 40 mg by mouth daily. ) 90 tablet 3  . Polyethyl Glycol-Propyl Glycol (SYSTANE) 0.4-0.3 % SOLN Place 1 drop into both  eyes 2 (two) times daily as needed (dry eyes).     . Tamsulosin HCl (FLOMAX) 0.4 MG CAPS Take 0.4 mg by mouth daily after breakfast.     . vitamin B-12 (CYANOCOBALAMIN) 1000 MCG tablet Take 1,000 mcg by mouth daily.     No current facility-administered medications for this encounter.    Allergies  Allergen Reactions  . Tetanus Toxoids Swelling    Face swelling  . Statins Other (See Comments)    Hard to walk, severe leg cramps Other reaction(s): Other (See Comments) Muscle weakness "unable to walk"  . Dicyclomine Other (See Comments)    constipation  . Sulfamethoxazole     Other reaction(s): Other (See Comments) unknown  . Doxycycline Rash    Rash only  . Oxycodone Other (See Comments)    Severe constipation  . Penicillins Swelling and Rash    Took as a child, developed rash  over time Has patient had a PCN reaction causing immediate rash, facial/tongue/throat swelling, SOB or lightheadedness with hypotension: Yes Has patient had a PCN reaction causing severe rash involving mucus membranes or skin necrosis: Unknown Has patient had a PCN reaction that required hospitalization: No Has patient had a PCN reaction occurring within the last 10 years: No If all of the above answers are "NO", then may proceed with Cephalosporin use.  Other reaction(s): Other (See Comments) unknown  . Sulfa Antibiotics Swelling and Rash  . Xarelto [Rivaroxaban] Rash    rash    Social History   Socioeconomic History  . Marital status: Married    Spouse name: Candace  . Number of children: 2  . Years of education: Not on file  . Highest education level: Bachelor's degree (e.g., BA, AB, BS)  Occupational History  . Occupation: retired; Ambulance person: RETIRED  Tobacco Use  . Smoking status: Never Smoker  . Smokeless tobacco: Never Used  Substance and Sexual Activity  . Alcohol use: Yes    Alcohol/week: 2.0 standard drinks    Types: 1 Glasses of wine, 1 Cans of beer per week      Comment: glass of wine or "couple of beers" occasionally   . Drug use: No  . Sexual activity: Yes    Partners: Female  Other Topics Concern  . Not on file  Social History Narrative   Lives with wife Candace.       Moved from [place], [place]   Retired from [employer].   Plays golf.   Works with [employer] and [employer] for [employer].   Wears seatbelt.   Drives.   Eats all food groups.   Never smoked.   Married for over 60 years June  (2020)   Has two grown children, live in [place] and [place].   Social Determinants of Health   Financial Resource Strain:   . Difficulty of Paying Living Expenses:   Food Insecurity:   . Worried About Charity fundraiser in the Last Year:   . Arboriculturist in the Last Year:   Transportation Needs:   . Film/video editor (Medical):   Marland Kitchen Lack of Transportation (Non-Medical):   Physical Activity:   . Days of Exercise per Week:   . Minutes of Exercise per Session:   Stress:   . Feeling of Stress :   Social Connections:   . Frequency of Communication with Friends and Family:   . Frequency of Social Gatherings with Friends and Family:   . Attends Religious Services:   . Active Member of Clubs or Organizations:   . Attends Archivist Meetings:   Marland Kitchen Marital Status:   Intimate Partner Violence:   . Fear of Current or Ex-Partner:   . Emotionally Abused:   Marland Kitchen Physically Abused:   . Sexually Abused:     Family History  Problem Relation Age of Onset  . Prostate cancer Father   . Cancer Father        prostate to bone  . Hypertension Father   . Prostate cancer Son 91  . ALS Son   . Prostate cancer Brother   . Heart disease Mother        died at 43  . Arthritis Mother   . Colon cancer Neg Hx     ROS- All systems are reviewed and negative except as per the HPI above  Physical Exam: Vitals:   11/03/19 1108  BP: (!) 154/80  Pulse: 78  Weight: 89.3 kg  Height: 5\' 10"  (1.778 m)   Wt Readings from Last 3 Encounters:   11/03/19 89.3 kg  10/26/19 88 kg  10/06/19 84.8 kg    Labs: Lab Results  Component Value Date   NA 143 10/19/2019   K 4.5 10/19/2019   CL 108 10/19/2019   CO2 26 10/19/2019   GLUCOSE 96 10/19/2019   BUN 13 10/19/2019   CREATININE 1.06 10/19/2019   CALCIUM 9.4 10/19/2019   MG 2.0 06/08/2019   No results found for: INR Lab Results  Component Value Date   CHOL 168 10/19/2019   HDL 41 10/19/2019   LDLCALC 101 (H) 10/19/2019   TRIG 160 (H) 10/19/2019     GEN- The patient is well appearing, alert and oriented x 3 today.   Head- normocephalic, atraumatic Eyes-  Sclera clear, conjunctiva pink Ears- hearing intact Oropharynx- clear Neck- supple, no JVP Lymph- no cervical lymphadenopathy Lungs- Clear to ausculation bilaterally, normal work of breathing Heart- Regular rate and rhythm, no murmurs, rubs or gallops, PMI not laterally displaced GI- soft, NT, ND, + BS Extremities- no clubbing, cyanosis, or edema MS- no significant deformity or atrophy Skin- no rash or lesion Psych- euthymic mood, full affect Neuro- strength and sensation are intact  EKG-SR with first degree AV block, RBBB, qtc 483 ms    Assessment and Plan: 1. Persistent  afib Doing well past ablation staying in SR Dofetilide 500 mg bid Recent K+ level for tikosyn surveillance wnl at 4.5, magnesium today   2. HTN Stable  3.CHA2DS2VASc score of 3 Continue  eliquis 5 mg bid for the 3 month recovery period after ablation without interruption   F/u with Dr. Curt Bears as scheduled 01/11/20  Geroge Baseman. Sharone Picchi, [facility] [address] [place], [place] [address] [phone]

## 2019-12-20 DIAGNOSIS — N138 Other obstructive and reflux uropathy: Secondary | ICD-10-CM | POA: Diagnosis not present

## 2019-12-20 DIAGNOSIS — C61 Malignant neoplasm of prostate: Secondary | ICD-10-CM | POA: Insufficient documentation

## 2020-01-05 ENCOUNTER — Ambulatory Visit: Payer: Medicare Other | Admitting: Family Medicine

## 2020-01-10 ENCOUNTER — Telehealth: Payer: Self-pay

## 2020-01-10 NOTE — Telephone Encounter (Signed)
PT LVM that he has had two attached ticks one deer tick, and one larger one.  He wants to know if he should be taken anything, or what Hannahs direction would be for him

## 2020-01-11 ENCOUNTER — Encounter: Payer: Self-pay | Admitting: Cardiology

## 2020-01-11 ENCOUNTER — Other Ambulatory Visit: Payer: Self-pay

## 2020-01-11 ENCOUNTER — Ambulatory Visit: Payer: Medicare Other | Admitting: Cardiology

## 2020-01-11 ENCOUNTER — Other Ambulatory Visit: Payer: Self-pay | Admitting: Family Medicine

## 2020-01-11 VITALS — BP 156/96 | HR 76 | Ht 70.0 in | Wt 198.4 lb

## 2020-01-11 DIAGNOSIS — I4819 Other persistent atrial fibrillation: Secondary | ICD-10-CM

## 2020-01-11 DIAGNOSIS — W57XXXA Bitten or stung by nonvenomous insect and other nonvenomous arthropods, initial encounter: Secondary | ICD-10-CM

## 2020-01-11 MED ORDER — DOXYCYCLINE HYCLATE 100 MG PO TABS: 200.0000 mg | ORAL_TABLET | Freq: Once | ORAL | 0 refills | Status: AC

## 2020-01-11 NOTE — Telephone Encounter (Signed)
I will send in the prophylactic dose of Doxy for him to take. If he develops any symptoms advise him to call us back.

## 2020-01-11 NOTE — Telephone Encounter (Signed)
Please call and review symptoms if any. Target sign, rash, itching, fevers, chills, headaches, aches or pains... Based off this I will see if medication is needed

## 2020-01-11 NOTE — Patient Instructions (Signed)
Medication Instructions:  Your physician recommends that you continue on your current medications as directed. Please refer to the Current Medication list given to you today.  *If you need a refill on your cardiac medications before your next appointment, please call your pharmacy*   Lab Work: None ordered If you have labs (blood work) drawn today and your tests are completely normal, you will receive your results only by: . MyChart Message (if you have MyChart) OR . A paper copy in the mail If you have any lab test that is abnormal or we need to change your treatment, we will call you to review the results.   Testing/Procedures: None ordered   Follow-Up: At [facility], you and your health needs are our priority.  As part of our continuing mission to provide you with exceptional heart care, we have created designated Provider Care Teams.  These Care Teams include your primary Cardiologist (physician) and Advanced Practice Providers (APPs -  Physician Assistants and Nurse Practitioners) who all work together to provide you with the care you need, when you need it.  We recommend signing up for the patient portal called "MyChart".  Sign up information is provided on this After Visit Summary.  MyChart is used to connect with patients for Virtual Visits (Telemedicine).  Patients are able to view lab/test results, encounter notes, upcoming appointments, etc.  Non-urgent messages can be sent to your provider as well.   To learn more about what you can do with MyChart, go to [email or web address].    Your next appointment:   3 month(s)  The format for your next appointment:   In Person  Provider:   Will Camnitz, MD   Thank you for choosing [facility]!!   Sumayya Muha, RN [phone]    Other Instructions    

## 2020-01-11 NOTE — Telephone Encounter (Signed)
Left message for patient letting him know that prescription was called in for him. If he has any questions or develops any symptoms to call the office.

## 2020-01-11 NOTE — Progress Notes (Signed)
Electrophysiology Office Note   Date:  01/11/2020   ID:  Brent Wilkins, Brent Wilkins Sep 15, 1940, MRN [id number]  PCP:  [name], NP  Cardiologist:  Harrington Challenger Primary Electrophysiologist:  Jemmie Rhinehart [name], MD    No chief complaint on file.    History of Present Illness: Brent Wilkins is a 79 y.o. male who is being seen today for the evaluation of atrial fibrillation at the request of [name], NP. Presenting today for electrophysiology evaluation. He has a history of hypertension, paroxysmal atrial fibrillation, and hyperlipidemia. He was seen in the hospital total at the beginning of April for syncope. The patient was bradycardic and thus not on any AV nodal blockers. At the time of hospitalization, he was found to be orthostatic.  Due to his atrial fibrillation, he was admitted to the hospital for Tikosyn administration.  He tolerated the dose well and was discharged on 500 mcg.  Today, denies symptoms of palpitations, chest pain, shortness of breath, orthopnea, PND, lower extremity edema, claudication, dizziness, presyncope, syncope, bleeding, or neurologic sequela. The patient is tolerating medications without difficulties.  He had minimal palpitations since his ablation.  He overall has been doing well.  He continues to have episodes of near syncope.  This has improved greatly since his ablation.  His episodes occur only when he is standing for quite some time.  When he is walking, he has not had any near syncopal episodes.  Past Medical History:  Diagnosis Date  . Allergy    hay fever;  seasonal  . Arthritis    right ankle  . Cataract    surgery 2016  . Chronic kidney disease    stones many years ago  . Dizziness 12/13/2015  . Dysphagia   . Family history of malignant neoplasm of prostate 12/12/2017  . Frequent urination at night   . GERD (gastroesophageal reflux disease)   . History of kidney stones   . History of prostate cancer 06/16/2017  . HTN (hypertension)   .  Hyperlipidemia LDL goal <100 10/26/2019  . Hypothyroidism   . 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 Neuropathy    Bilateral ankles  . Paroxysmal atrial fibrillation (HCC)    a. on Tikosyn and Eliquis  . Polyneuropathy 06/16/2017  . Prostate cancer [facility]) 2010   Dr. Rosana Hoes  . Schatzki's ring 10/22/2011  . Seasonal allergies   . Syncope  dx. 8 yrs ago  . Vaccine counseling 09/15/2019   Past Surgical History:  Procedure Laterality Date  . APPENDECTOMY    . ATRIAL FIBRILLATION ABLATION N/A 10/06/2019   Procedure: ATRIAL FIBRILLATION ABLATION;  Surgeon: Constance Haw, MD;  Location: [place] [facility];  Service: Cardiovascular;  Laterality: N/A;  . CATARACT EXTRACTION W/PHACO Left 03/27/2015   Procedure: CATARACT EXTRACTION PHACO AND INTRAOCULAR LENS PLACEMENT LEFT EYE CDE=11.73;  Surgeon: Tonny Branch, MD;  Location: [facility];  Service: Ophthalmology;  Laterality: Left;  . CATARACT EXTRACTION W/PHACO Right 03/30/2015   Procedure: CATARACT EXTRACTION PHACO AND INTRAOCULAR LENS PLACEMENT RIGHT EYE CDE=7.35;  Surgeon: Tonny Branch, MD;  Location: [facility];  Service: Ophthalmology;  Laterality: Right;  . CHOLECYSTECTOMY    . COLONOSCOPY  07/23/10   Dr. Vivi Ferns rectum, scattered pancolonic diverticula  . COLONOSCOPY  08/10/1999   internal hemorrhoids,inflammatory polyp  . ESOPHAGOGASTRODUODENOSCOPY  04/12/08   Prominent Schatzki's ring, erosive reflux esophagitis, multiple antral erosions, small hiatal hernia, reactive gastropathy, status post dilation with 5F  . ESOPHAGOGASTRODUODENOSCOPY  10/28/2011  Procedure: ESOPHAGOGASTRODUODENOSCOPY (EGD);  Surgeon: Daneil Dolin, MD;  Location: [facility];  Service: Endoscopy;  Laterality: N/A;  1:30  . ESOPHAGOGASTRODUODENOSCOPY N/A 09/10/2017   Dr. Gala Romney: Schatkzi's ring s/p dilation at GE junction, mild esophageal reflux esophagitis, medium sized hiatal hernia, normal duodenum. 56 and 68 Fr dilation  . EYE SURGERY    . MALONEY DILATION  10/28/2011    Procedure: Venia Minks DILATION;  Surgeon: Daneil Dolin, MD;  Location: [facility];  Service: Endoscopy;  Laterality: N/A;  . Venia Minks DILATION N/A 09/10/2017   Procedure: Venia Minks DILATION;  Surgeon: Daneil Dolin, MD;  [facility];  Service: Endoscopy;  Laterality: N/A;  . SAVORY DILATION  10/28/2011   Procedure: SAVORY DILATION;  Surgeon: Daneil Dolin, MD;  Location: [facility];  Service: Endoscopy;  Laterality: N/A;  . TONSILLECTOMY    . TOTAL ANKLE ARTHROPLASTY Left 11/25/2013   Procedure: TOTAL ANKLE ARTHOPLASTY;  Surgeon: Wylene Simmer, MD;  Location: [place];  Service: Orthopedics;  Laterality: Left;  . TOTAL ANKLE ARTHROPLASTY Right 07/14/2014   dr hewitt  . TOTAL ANKLE ARTHROPLASTY Right 07/14/2014   Procedure: RIGHT TOTAL ANKLE ARTHOPLASTY;  Surgeon: Wylene Simmer, MD;  Location: [place];  Service: Orthopedics;  Laterality: Right;     Current Outpatient Medications  Medication Sig Dispense Refill  . amLODipine (NORVASC) 5 MG tablet TAKE 1 TABLET BY MOUTH  DAILY 90 tablet 3  . celecoxib (CELEBREX) 200 MG capsule Take 200 mg by mouth as needed for mild pain.     . diphenhydramine-acetaminophen (TYLENOL PM) 25-500 MG TABS tablet Take 1 tablet by mouth at bedtime as needed (sleep and mild discomfort).     . docusate sodium (COLACE) 100 MG capsule Take 100 mg by mouth as needed for mild constipation.    . dofetilide (TIKOSYN) 500 MCG capsule Take 1 capsule (500 mcg total) by mouth 2 (two) times daily. 180 capsule 2  . ELIQUIS 5 MG TABS tablet TAKE 1 TABLET BY MOUTH TWO  TIMES DAILY 180 tablet 2  . finasteride (PROSCAR) 5 MG tablet Take 5 mg by mouth daily.    . fluticasone (FLONASE) 50 MCG/ACT nasal spray Place 1 spray into both nostrils daily as needed for allergies. 48 g 3  . gabapentin (NEURONTIN) 300 MG capsule Take 300 mg by mouth 2 (two) times daily.     Marland Kitchen ibuprofen (ADVIL,MOTRIN) 200 MG tablet Take 400 mg by mouth as needed for headache (headache/pain.).     Marland Kitchen  levothyroxine (SYNTHROID) 75 MCG tablet Take 1 tablet (75 mcg total) by mouth daily. 90 tablet 1  . loratadine (CLARITIN) 10 MG tablet Take 10 mg by mouth daily as needed for allergies.    Marland Kitchen losartan (COZAAR) 50 MG tablet TAKE 1 TABLET BY MOUTH  DAILY 90 tablet 3  . Omega-3 Fatty Acids (RA FISH OIL) 1400 MG CPDR Take 1,400 mg by mouth daily.    . pantoprazole (PROTONIX) 40 MG tablet TAKE 1 TABLET BY MOUTH  DAILY BEFORE BREAKFAST 90 tablet 3  . Polyethyl Glycol-Propyl Glycol (SYSTANE) 0.4-0.3 % SOLN Place 1 drop into both eyes 2 (two) times daily as needed (dry eyes).     . Tamsulosin HCl (FLOMAX) 0.4 MG CAPS Take 0.4 mg by mouth daily after breakfast.     . vitamin B-12 (CYANOCOBALAMIN) 1000 MCG tablet Take 1,000 mcg by mouth daily.     No current facility-administered medications for this visit.    Allergies:   Tetanus toxoids,  Statins, Dicyclomine, Sulfamethoxazole, Doxycycline, Oxycodone, Penicillins, Sulfa antibiotics, and Xarelto [rivaroxaban]   Social History:  The patient  reports that he has never smoked. He has never used smokeless tobacco. He reports current alcohol use of about 2.0 standard drinks of alcohol per week. He reports that he does not use drugs.   Family History:  The patient's family history includes ALS in his son; Arthritis in his mother; Cancer in his father; Heart disease in his mother; Hypertension in his father; Prostate cancer in his brother and father; Prostate cancer (age of onset: 82) in his son.   ROS:  Please see the history of present illness.   Otherwise, review of systems is positive for none.   All other systems are reviewed and negative.   PHYSICAL EXAM: VS:  BP (!) 156/96   Pulse 76   Ht 5\' 10"  (1.778 m)   Wt 198 lb 6.4 oz (90 kg)   SpO2 98%   BMI 28.47 kg/m  , BMI Body mass index is 28.47 kg/m. GEN: Well nourished, well developed, in no acute distress  HEENT: normal  Neck: no JVD, carotid bruits, or masses Cardiac: RRR; no murmurs, rubs, or  gallops,no edema  Respiratory:  clear to auscultation bilaterally, normal work of breathing GI: soft, nontender, nondistended, + BS MS: no deformity or atrophy  Skin: warm and dry Neuro:  Strength and sensation are intact Psych: euthymic mood, full affect  EKG:  EKG is ordered today. Personal review of the ekg ordered shows SR, RBBB   Recent Labs: 10/19/2019: ALT 9; BUN 13; Creat 1.06; Hemoglobin 13.0; Platelets 294; Potassium 4.5; Sodium 143; TSH 2.68 11/03/2019: Magnesium 2.0    Lipid Panel     Component Value Date/Time   CHOL 168 10/19/2019 0916   TRIG 160 (H) 10/19/2019 0916   HDL 41 10/19/2019 0916   CHOLHDL 4.1 10/19/2019 0916   VLDL 30 09/22/2017 1444   LDLCALC 101 (H) 10/19/2019 0916     Wt Readings from Last 3 Encounters:  01/11/20 198 lb 6.4 oz (90 kg)  11/03/19 196 lb 12.8 oz (89.3 kg)  10/26/19 194 lb (88 kg)      Other studies Reviewed: Additional studies/ records that were reviewed today include: 30 day monitor 04/28/17 - personally reviewed  Review of the above records today demonstrates:   Atrial fibrillation and flutter seen throughout study.  Multiple pauses up to 3 seconds with most occurring during early morning hours (presumably asleep) and one occurring at approximately 1 pm.  TTE 03/21/17 - Left ventricle: The cavity size was normal. Wall thickness was   increased in a pattern of moderate LVH. Systolic function was   normal. The estimated ejection fraction was in the range of 60%   to 65%. Wall motion was normal; there were no regional wall   motion abnormalities. Doppler parameters are consistent with   restrictive physiology, indicative of decreased left ventricular   diastolic compliance and/or increased left atrial pressure.   Doppler parameters are consistent with high ventricular filling   pressure. - Aortic valve: Moderately calcified annulus. Trileaflet. - Mitral valve: Mildly calcified annulus. - Left atrium: The atrium was severely  dilated. - Right ventricle: Systolic function was mildly reduced. - Right atrium: The atrium was mildly to moderately dilated. - Tricuspid valve: There was mild-moderate regurgitation. - Pulmonary arteries: Incomplete TR jet to accurately estimated   PASP.  ASSESSMENT AND PLAN:  1.  Atrial fibrillation/atrial flutter: Currently on Eliquis and dofetilide.  CHA2DS2-VASc of 3.  Is status post ablation October 06, 2019.  He is remained in sinus rhythm.  No changes.  2. Syncope: Unfortunately he has continued to have episodes of near syncope.  This has greatly improved since his ablation.  He would like to hold off on any further therapy for another few months.  As he has not had any episodes of syncope, we Brent Wilkins continue to monitor.   3. Hypertension: Elevated today but usually well controlled.  No changes.   Current medicines are reviewed at length with the patient today.   The patient does not have concerns regarding his medicines.  The following changes were made today: None  Labs/ tests ordered today include:  Orders Placed This Encounter  Procedures  . EKG 12-Lead     Disposition:   FU with Julann Mcgilvray 3 months  Signed, Jeray Shugart [name], MD  01/11/2020 11:10 AM     [facility] [address] [place] [address] [phone] (office) [phone] (fax)

## 2020-01-11 NOTE — Telephone Encounter (Signed)
Patient states no symptoms listed below. It did itch when he was first bitten but has gone away. He does have a lump where it was attached.

## 2020-01-17 ENCOUNTER — Ambulatory Visit: Payer: Medicare Other

## 2020-01-18 ENCOUNTER — Telehealth: Payer: Medicare Other

## 2020-01-19 DIAGNOSIS — X32XXXD Exposure to sunlight, subsequent encounter: Secondary | ICD-10-CM | POA: Diagnosis not present

## 2020-01-19 DIAGNOSIS — D225 Melanocytic nevi of trunk: Secondary | ICD-10-CM | POA: Diagnosis not present

## 2020-01-19 DIAGNOSIS — L570 Actinic keratosis: Secondary | ICD-10-CM | POA: Diagnosis not present

## 2020-01-19 DIAGNOSIS — L820 Inflamed seborrheic keratosis: Secondary | ICD-10-CM | POA: Diagnosis not present

## 2020-01-24 DIAGNOSIS — M25571 Pain in right ankle and joints of right foot: Secondary | ICD-10-CM | POA: Diagnosis not present

## 2020-01-24 DIAGNOSIS — M19071 Primary osteoarthritis, right ankle and foot: Secondary | ICD-10-CM | POA: Diagnosis not present

## 2020-01-25 ENCOUNTER — Other Ambulatory Visit: Payer: Self-pay | Admitting: Internal Medicine

## 2020-01-28 DIAGNOSIS — E785 Hyperlipidemia, unspecified: Secondary | ICD-10-CM | POA: Diagnosis not present

## 2020-01-28 DIAGNOSIS — R7301 Impaired fasting glucose: Secondary | ICD-10-CM | POA: Diagnosis not present

## 2020-01-28 DIAGNOSIS — I10 Essential (primary) hypertension: Secondary | ICD-10-CM | POA: Diagnosis not present

## 2020-01-31 ENCOUNTER — Other Ambulatory Visit: Payer: Self-pay

## 2020-01-31 ENCOUNTER — Encounter: Payer: Self-pay | Admitting: Family Medicine

## 2020-01-31 ENCOUNTER — Telehealth (INDEPENDENT_AMBULATORY_CARE_PROVIDER_SITE_OTHER): Payer: Medicare Other

## 2020-01-31 VITALS — BP 131/76 | Ht 70.0 in | Wt 193.0 lb

## 2020-01-31 DIAGNOSIS — Z0000 Encounter for general adult medical examination without abnormal findings: Secondary | ICD-10-CM | POA: Diagnosis not present

## 2020-01-31 NOTE — Progress Notes (Signed)
Subjective:   Brent Wilkins is a 79 y.o. male who presents for Medicare Annual/Subsequent preventive examination.  Review of Systems     Cardiac Risk Factors include: advanced age (>34men, >67 women);dyslipidemia;hypertension;male gender     Objective:    Today's Vitals   01/31/20 1357  BP: 131/76  Weight: 193 lb (87.5 kg)  Height: 5\' 10"  (1.778 m)   Body mass index is 27.69 kg/m.  Advanced Directives 10/06/2019 12/07/2018 09/10/2017 05/05/2017 03/21/2017 03/27/2015 03/22/2015  Does Patient Have a Medical Advance Directive? No No No No No No No  Would patient like information on creating a medical advance directive? No - Patient declined No - Patient declined No - Patient declined No - Patient declined No - Patient declined No - patient declined information No - patient declined information  Pre-existing out of facility DNR order (yellow form or pink MOST form) - - - - - - -    Current Medications (verified) Outpatient Encounter Medications as of 01/31/2020  Medication Sig  . amLODipine (NORVASC) 5 MG tablet TAKE 1 TABLET BY MOUTH  DAILY  . celecoxib (CELEBREX) 200 MG capsule Take 200 mg by mouth as needed for mild pain.   . diphenhydramine-acetaminophen (TYLENOL PM) 25-500 MG TABS tablet Take 1 tablet by mouth at bedtime as needed (sleep and mild discomfort).   . docusate sodium (COLACE) 100 MG capsule Take 100 mg by mouth as needed for mild constipation.  . dofetilide (TIKOSYN) 500 MCG capsule Take 1 capsule (500 mcg total) by mouth 2 (two) times daily.  Marland Kitchen ELIQUIS 5 MG TABS tablet TAKE 1 TABLET BY MOUTH TWO  TIMES DAILY  . finasteride (PROSCAR) 5 MG tablet Take 5 mg by mouth daily.  . fluticasone (FLONASE) 50 MCG/ACT nasal spray Place 1 spray into both nostrils daily as needed for allergies.  Marland Kitchen gabapentin (NEURONTIN) 300 MG capsule Take 300 mg by mouth 2 (two) times daily.   Marland Kitchen ibuprofen (ADVIL,MOTRIN) 200 MG tablet Take 400 mg by mouth as needed for headache (headache/pain.).   Marland Kitchen  levothyroxine (SYNTHROID) 75 MCG tablet Take 1 tablet (75 mcg total) by mouth daily.  Marland Kitchen loratadine (CLARITIN) 10 MG tablet Take 10 mg by mouth daily as needed for allergies.  Marland Kitchen losartan (COZAAR) 50 MG tablet TAKE 1 TABLET BY MOUTH  DAILY  . Omega-3 Fatty Acids (RA FISH OIL) 1400 MG CPDR Take 1,400 mg by mouth daily.  . pantoprazole (PROTONIX) 40 MG tablet TAKE 1 TABLET BY MOUTH  DAILY BEFORE BREAKFAST  . Polyethyl Glycol-Propyl Glycol (SYSTANE) 0.4-0.3 % SOLN Place 1 drop into both eyes 2 (two) times daily as needed (dry eyes).   . Tamsulosin HCl (FLOMAX) 0.4 MG CAPS Take 0.4 mg by mouth daily after breakfast.   . vitamin B-12 (CYANOCOBALAMIN) 1000 MCG tablet Take 1,000 mcg by mouth daily.   No facility-administered encounter medications on file as of 01/31/2020.    Allergies (verified) Tetanus toxoids, Statins, Dicyclomine, Sulfamethoxazole, Doxycycline, Oxycodone, Penicillins, Sulfa antibiotics, and Xarelto [rivaroxaban]   History: Past Medical History:  Diagnosis Date  . Allergy    hay fever;  seasonal  . Arthritis    right ankle  . Cataract    surgery 2016  . Chronic kidney disease    stones many years ago  . Dizziness 12/13/2015  . Dysphagia   . Family history of malignant neoplasm of prostate 12/12/2017  . Frequent urination at night   . GERD (gastroesophageal reflux disease)   . History of kidney stones   .  History of prostate cancer 06/16/2017  . HTN (hypertension)   . Hyperlipidemia LDL goal <100 10/26/2019  . Hypothyroidism   . 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 Neuropathy    Bilateral ankles  . Paroxysmal atrial fibrillation (HCC)    a. on Tikosyn and Eliquis  . Polyneuropathy 06/16/2017  . Prostate cancer [facility]) 2010   Dr. Rosana Hoes  . Schatzki's ring 10/22/2011  . Seasonal allergies   . Syncope  dx. 8 yrs ago  . Vaccine counseling 09/15/2019   Past Surgical History:  Procedure Laterality Date  . APPENDECTOMY    . ATRIAL FIBRILLATION ABLATION N/A 10/06/2019    Procedure: ATRIAL FIBRILLATION ABLATION;  Surgeon: Constance Haw, MD;  Location: [facility];  Service: Cardiovascular;  Laterality: N/A;  . CATARACT EXTRACTION W/PHACO Left 03/27/2015   Procedure: CATARACT EXTRACTION PHACO AND INTRAOCULAR LENS PLACEMENT LEFT EYE CDE=11.73;  Surgeon: Tonny Branch, MD;  Location: [facility];  Service: Ophthalmology;  Laterality: Left;  . CATARACT EXTRACTION W/PHACO Right 03/30/2015   Procedure: CATARACT EXTRACTION PHACO AND INTRAOCULAR LENS PLACEMENT RIGHT EYE CDE=7.35;  Surgeon: Tonny Branch, MD;  Location: [facility];  Service: Ophthalmology;  Laterality: Right;  . CHOLECYSTECTOMY    . COLONOSCOPY  07/23/10   Dr. Vivi Ferns rectum, scattered pancolonic diverticula  . COLONOSCOPY  08/10/1999   internal hemorrhoids,inflammatory polyp  . ESOPHAGOGASTRODUODENOSCOPY  04/12/08   Prominent Schatzki's ring, erosive reflux esophagitis, multiple antral erosions, small hiatal hernia, reactive gastropathy, status post dilation with 12F  . ESOPHAGOGASTRODUODENOSCOPY  10/28/2011   Procedure: ESOPHAGOGASTRODUODENOSCOPY (EGD);  Surgeon: Daneil Dolin, MD;  [facility];  Service: Endoscopy;  Laterality: N/A;  1:30  . ESOPHAGOGASTRODUODENOSCOPY N/A 09/10/2017   Dr. Gala Romney: Schatkzi's ring s/p dilation at GE junction, mild esophageal reflux esophagitis, medium sized hiatal hernia, normal duodenum. 56 and 33 Fr dilation  . EYE SURGERY    . JOINT REPLACEMENT N/A    Phreesia 01/15/2020  . MALONEY DILATION  10/28/2011   Procedure: Venia Minks DILATION;  Surgeon: Daneil Dolin, MD;  [facility];  Service: Endoscopy;  Laterality: N/A;  . Venia Minks DILATION N/A 09/10/2017   Procedure: Venia Minks DILATION;  Surgeon: Daneil Dolin, MD;  [facility];  Service: Endoscopy;  Laterality: N/A;  . SAVORY DILATION  10/28/2011   Procedure: SAVORY DILATION;  Surgeon: Daneil Dolin, MD;  [facility];  Service: Endoscopy;  Laterality: N/A;  . TONSILLECTOMY    .  TOTAL ANKLE ARTHROPLASTY Left 11/25/2013   Procedure: TOTAL ANKLE ARTHOPLASTY;  Surgeon: Wylene Simmer, MD;  Location: [facility];  Service: Orthopedics;  Laterality: Left;  . TOTAL ANKLE ARTHROPLASTY Right 07/14/2014   dr hewitt  . TOTAL ANKLE ARTHROPLASTY Right 07/14/2014   Procedure: RIGHT TOTAL ANKLE ARTHOPLASTY;  Surgeon: Wylene Simmer, MD;  Location: [facility];  Service: Orthopedics;  Laterality: Right;   Family History  Problem Relation Age of Onset  . Prostate cancer Father   . Cancer Father        prostate to bone  . Hypertension Father   . Prostate cancer Son 34  . ALS Son   . Prostate cancer Brother   . Heart disease Mother        died at 5  . Arthritis Mother   . Colon cancer Neg Hx    Social History   Socioeconomic History  . Marital status: Married    Spouse name: Candace  . Number of children: 2  .  Years of education: Not on file  . Highest education level: Bachelor's degree (e.g., BA, AB, BS)  Occupational History  . Occupation: retired; Ambulance person: RETIRED  Tobacco Use  . Smoking status: Never Smoker  . Smokeless tobacco: Never Used  Vaping Use  . Vaping Use: Never used  Substance and Sexual Activity  . Alcohol use: Yes    Alcohol/week: 2.0 standard drinks    Types: 1 Glasses of wine, 1 Cans of beer per week    Comment: glass of wine or "couple of beers" occasionally   . Drug use: No  . Sexual activity: Yes    Partners: Female  Other Topics Concern  . Not on file  Social History Narrative   Lives with wife Candace.       Moved from [place], [place]   Retired from [employer].   Plays golf.   Works with [employer] and [employer] for [employer].   Wears seatbelt.   Drives.   Eats all food groups.   Never smoked.   Married for over 13 years June  (2020)   Has two grown children, live in [place] and [place].   Social Determinants of Health   Financial Resource Strain: Low Risk   . Difficulty of Paying Living Expenses: Not hard at all  Food  Insecurity: No Food Insecurity  . Worried About Charity fundraiser in the Last Year: Never true  . Ran Out of Food in the Last Year: Never true  Transportation Needs: No Transportation Needs  . Lack of Transportation (Medical): No  . Lack of Transportation (Non-Medical): No  Physical Activity: Sufficiently Active  . Days of Exercise per Week: 7 days  . Minutes of Exercise per Session: 60 min  Stress: No Stress Concern Present  . Feeling of Stress : Not at all  Social Connections: Socially Integrated  . Frequency of Communication with Friends and Family: More than three times a week  . Frequency of Social Gatherings with Friends and Family: More than three times a week  . Attends Religious Services: More than 4 times per year  . Active Member of Clubs or Organizations: Yes  . Attends Archivist Meetings: More than 4 times per year  . Marital Status: Married    Tobacco Counseling Counseling given: Not Answered   Clinical Intake:  Pre-visit preparation completed: Yes  Pain : No/denies pain     Nutritional Status: BMI 25 -29 Overweight Diabetes: No  How often do you need to have someone help you when you read instructions, pamphlets, or other written materials from your doctor or pharmacy?: 1 - Never What is the last grade level you completed in school?: college  Diabetic? no  Interpreter Needed?: No  Information entered by :: Aibhlinn Kalmar lpn   Activities of Daily Living In your present state of health, do you have any difficulty performing the following activities: 01/31/2020 04/27/2019  Hearing? N Y  Comment - bilateral  Vision? N Y  Comment - glasses  Difficulty concentrating or making decisions? N N  Walking or climbing stairs? N Y  Comment - stairs, but not walking. ankle trouble  Dressing or bathing? N N  Doing errands, shopping? N N  Preparing Food and eating ? N -  Using the Toilet? N -  In the past six months, have you accidently leaked urine? N  -  Do you have problems with loss of bowel control? N -  Managing your Medications?  N -  Managing your Finances? N -  Housekeeping or managing your Housekeeping? N -  Some recent data might be hidden    Patient Care Team: [name], NP as PCP - General Constance Haw, MD as PCP - Electrophysiology (Cardiology) Fay Records, MD as PCP - Cardiology (Cardiology) Gala Romney Cristopher Estimable, MD (Gastroenterology) Myrlene Broker, MD as Attending Physician (Urology)  Indicate any recent Medical Services you may have received from other than [facility] providers in the past year (date may be approximate).     Assessment:   This is a routine wellness examination for Brent Wilkins.  Hearing/Vision screen No exam data present  Dietary issues and exercise activities discussed: Current Exercise Habits: Home exercise routine, Type of exercise: walking, Time (Minutes): 60, Frequency (Times/Week): 7, Weekly Exercise (Minutes/Week): 420  Goals    . Exercise 150 minutes per week (moderate activity)     Walks dog daily and works in the yard    . Prevent falls      Depression Screen PHQ 2/9 Scores 09/23/2019 09/15/2019 06/15/2019 04/27/2019 12/31/2018 12/09/2018 09/22/2018  PHQ - 2 Score 0 0 0 0 0 0 0    Fall Risk Fall Risk  01/31/2020 10/26/2019 09/23/2019 09/15/2019 06/15/2019  Falls in the past year? 0 1 0 0 0  Number falls in past yr: 0 1 0 0 0  Injury with Fall? 0 0 0 0 0  Risk Factor Category  - - - - -  Risk for fall due to : - - - - -  Follow up - - Falls evaluation completed;Education provided;Falls prevention discussed - -     Any stairs in or around the home? Yes  If so, are there any without handrails? Yes  Home free of loose throw rugs in walkways, pet beds, electrical cords, etc? Yes  Adequate lighting in your home to reduce risk of falls? Yes   ASSISTIVE DEVICES UTILIZED TO PREVENT FALLS:  Life alert? No  Use of a cane, walker or w/c? No  Grab bars in the bathroom? Yes  Shower  chair or bench in shower? Yes  Elevated toilet seat or a handicapped toilet? Yes   TIMED UP AND GO:  Was the test performed? No .  Length of time to ambulate 10 feet:  Unable due to virtual visit      Cognitive Function:     6CIT Screen 01/31/2020 12/31/2018  What Year? 0 points 0 points  What month? 0 points 0 points  What time? 0 points 0 points  Count back from 20 0 points 0 points  Months in reverse 0 points 0 points  Repeat phrase 0 points 0 points  Total Score 0 0    Immunizations Immunization History  Administered Date(s) Administered  . Fluad Quad(high Dose 65+) 04/27/2019  . Influenza, High Dose Seasonal PF 05/07/2017, 06/12/2018  . Moderna SARS-COVID-2 Vaccination 08/17/2019, 09/17/2019  . Pneumococcal Conjugate-13 06/16/2017  . Pneumococcal Polysaccharide-23 01/02/2018  . Zoster Recombinat (Shingrix) 04/07/2018, 09/22/2018    TDAP status: Up to date Pt has allergy to the vaccine  Flu Vaccine status: Up to date Pneumococcal vaccine status: Up to date Covid-19 vaccine status: Completed vaccines  Qualifies for Shingles Vaccine? Yes   Zostavax completed No   Shingrix Completed?: Yes  Screening Tests Health Maintenance  Topic Date Due  . INFLUENZA VACCINE  03/05/2020  . COVID-19 Vaccine  Completed  . PNA vac Low Risk Adult  Completed  . TETANUS/TDAP  Discontinued  Health Maintenance  There are no preventive care reminders to display for this patient.  Colorectal cancer screening: Completed yes. Repeat every 10 years patient does not want further testing  Lung Cancer Screening: (Low Dose CT Chest recommended if Age 54-80 years, 30 pack-year currently smoking OR have quit w/in 15years.) does not qualify.   Lung Cancer Screening Referral: does not qualify  Additional Screening:  Hepatitis C Screening: does not qualify;  Vision Screening: Recommended annual ophthalmology exams for early detection of glaucoma and other disorders of the eye. Is the  patient up to date with their annual eye exam?  Yes  Who is the provider or what is the name of the office in which the patient attends annual eye exams? [facility] dr  If pt is not established with a provider, would they like to be referred to a provider to establish care? Yes .   Dental Screening: Recommended annual dental exams for proper oral hygiene  Community Resource Referral / Chronic Care Management: CRR required this visit?  No   CCM required this visit?  No      Plan:     I have personally reviewed and noted the following in the patient's chart:   . Medical and social history . Use of alcohol, tobacco or illicit drugs  . Current medications and supplements . Functional ability and status . Nutritional status . Physical activity . Advanced directives . List of other physicians . Hospitalizations, surgeries, and ER visits in previous 12 months . Vitals . Screenings to include cognitive, depression, and falls . Referrals and appointments  In addition, I have reviewed and discussed with patient certain preventive protocols, quality metrics, and best practice recommendations. A written personalized care plan for preventive services as well as general preventive health recommendations were provided to patient.     Kate Sable, LPN, LPN   4/48/1856   Nurse Notes:

## 2020-01-31 NOTE — Patient Instructions (Addendum)
Brent Wilkins , Thank you for taking time to come for your Medicare Wellness Visit. I appreciate your ongoing commitment to your health goals. Please review the following plan we discussed and let me know if I can assist you in the future.   Screening recommendations/referrals: Colonoscopy: up to date- no further testing desired  Recommended yearly ophthalmology/optometry visit for glaucoma screening and checkup Recommended yearly dental visit for hygiene and checkup  Vaccinations: Influenza vaccine: up to date  Pneumococcal vaccine: up to date  Tdap vaccine: allergic to vaccine  Shingles vaccine: up to date         Next appointment: wellness in 1 year   Preventive Care 81 Years and Older, Male Preventive care refers to lifestyle choices and visits with your health care provider that can promote health and wellness. What does preventive care include?  A yearly physical exam. This is also called an annual well check.  Dental exams once or twice a year.  Routine eye exams. Ask your health care provider how often you should have your eyes checked.  Personal lifestyle choices, including:  Daily care of your teeth and gums.  Regular physical activity.  Eating a healthy diet.  Avoiding tobacco and drug use.  Limiting alcohol use.  Practicing safe sex.  Taking low doses of aspirin every day.  Taking vitamin and mineral supplements as recommended by your health care provider. What happens during an annual well check? The services and screenings done by your health care provider during your annual well check will depend on your age, overall health, lifestyle risk factors, and family history of disease. Counseling  Your health care provider may ask you questions about your:  Alcohol use.  Tobacco use.  Drug use.  Emotional well-being.  Home and relationship well-being.  Sexual activity.  Eating habits.  History of falls.  Memory and ability to understand  (cognition).  Work and work Statistician. Screening  You may have the following tests or measurements:  Height, weight, and BMI.  Blood pressure.  Lipid and cholesterol levels. These may be checked every 5 years, or more frequently if you are over 30 years old.  Skin check.  Lung cancer screening. You may have this screening every year starting at age 9 if you have a 30-pack-year history of smoking and currently smoke or have quit within the past 15 years.  Fecal occult blood test (FOBT) of the stool. You may have this test every year starting at age 42.  Flexible sigmoidoscopy or colonoscopy. You may have a sigmoidoscopy every 5 years or a colonoscopy every 10 years starting at age 14.  Prostate cancer screening. Recommendations will vary depending on your family history and other risks.  Hepatitis C blood test.  Hepatitis B blood test.  Sexually transmitted disease (STD) testing.  Diabetes screening. This is done by checking your blood sugar (glucose) after you have not eaten for a while (fasting). You may have this done every 1-3 years.  Abdominal aortic aneurysm (AAA) screening. You may need this if you are a current or former smoker.  Osteoporosis. You may be screened starting at age 42 if you are at high risk. Talk with your health care provider about your test results, treatment options, and if necessary, the need for more tests. Vaccines  Your health care provider may recommend certain vaccines, such as:  Influenza vaccine. This is recommended every year.  Tetanus, diphtheria, and acellular pertussis (Tdap, Td) vaccine. You may need a Td booster every 10  years.  Zoster vaccine. You may need this after age 90.  Pneumococcal 13-valent conjugate (PCV13) vaccine. One dose is recommended after age 62.  Pneumococcal polysaccharide (PPSV23) vaccine. One dose is recommended after age 60. Talk to your health care provider about which screenings and vaccines you need and  how often you need them. This information is not intended to replace advice given to you by your health care provider. Make sure you discuss any questions you have with your health care provider. Document Released: 08/18/2015 Document Revised: 04/10/2016 Document Reviewed: 05/23/2015 Elsevier Interactive Patient Education  2017 [place] Prevention in the Home Falls can cause injuries. They can happen to people of all ages. There are many things you can do to make your home safe and to help prevent falls. What can I do on the outside of my home?  Regularly fix the edges of walkways and driveways and fix any cracks.  Remove anything that might make you trip as you walk through a door, such as a raised step or threshold.  Trim any bushes or trees on the path to your home.  Use bright outdoor lighting.  Clear any walking paths of anything that might make someone trip, such as rocks or tools.  Regularly check to see if handrails are loose or broken. Make sure that both sides of any steps have handrails.  Any raised decks and porches should have guardrails on the edges.  Have any leaves, snow, or ice cleared regularly.  Use sand or salt on walking paths during winter.  Clean up any spills in your garage right away. This includes oil or grease spills. What can I do in the bathroom?  Use night lights.  Install grab bars by the toilet and in the tub and shower. Do not use towel bars as grab bars.  Use non-skid mats or decals in the tub or shower.  If you need to sit down in the shower, use a plastic, non-slip stool.  Keep the floor dry. Clean up any water that spills on the floor as soon as it happens.  Remove soap buildup in the tub or shower regularly.  Attach bath mats securely with double-sided non-slip rug tape.  Do not have throw rugs and other things on the floor that can make you trip. What can I do in the bedroom?  Use night lights.  Make sure that you  have a light by your bed that is easy to reach.  Do not use any sheets or blankets that are too big for your bed. They should not hang down onto the floor.  Have a firm chair that has side arms. You can use this for support while you get dressed.  Do not have throw rugs and other things on the floor that can make you trip. What can I do in the kitchen?  Clean up any spills right away.  Avoid walking on wet floors.  Keep items that you use a lot in easy-to-reach places.  If you need to reach something above you, use a strong step stool that has a grab bar.  Keep electrical cords out of the way.  Do not use floor polish or wax that makes floors slippery. If you must use wax, use non-skid floor wax.  Do not have throw rugs and other things on the floor that can make you trip. What can I do with my stairs?  Do not leave any items on the stairs.  Make sure that  there are handrails on both sides of the stairs and use them. Fix handrails that are broken or loose. Make sure that handrails are as long as the stairways.  Check any carpeting to make sure that it is firmly attached to the stairs. Fix any carpet that is loose or worn.  Avoid having throw rugs at the top or bottom of the stairs. If you do have throw rugs, attach them to the floor with carpet tape.  Make sure that you have a light switch at the top of the stairs and the bottom of the stairs. If you do not have them, ask someone to add them for you. What else can I do to help prevent falls?  Wear shoes that:  Do not have high heels.  Have rubber bottoms.  Are comfortable and fit you well.  Are closed at the toe. Do not wear sandals.  If you use a stepladder:  Make sure that it is fully opened. Do not climb a closed stepladder.  Make sure that both sides of the stepladder are locked into place.  Ask someone to hold it for you, if possible.  Clearly mark and make sure that you can see:  Any grab bars or  handrails.  First and last steps.  Where the edge of each step is.  Use tools that help you move around (mobility aids) if they are needed. These include:  Canes.  Walkers.  Scooters.  Crutches.  Turn on the lights when you go into a dark area. Replace any light bulbs as soon as they burn out.  Set up your furniture so you have a clear path. Avoid moving your furniture around.  If any of your floors are uneven, fix them.  If there are any pets around you, be aware of where they are.  Review your medicines with your doctor. Some medicines can make you feel dizzy. This can increase your chance of falling. Ask your doctor what other things that you can do to help prevent falls. This information is not intended to replace advice given to you by your health care provider. Make sure you discuss any questions you have with your health care provider. Document Released: 05/18/2009 Document Revised: 12/28/2015 Document Reviewed: 08/26/2014 Elsevier Interactive Patient Education  2017 [employer].

## 2020-02-02 LAB — LIPID PANEL
Cholesterol: 181 mg/dL (ref <200)
HDL: 50 mg/dL (ref >=40)
LDL Cholesterol (Calc): 104 mg/dL (calc) — ABNORMAL HIGH
Non-HDL Cholesterol (Calc): 131 mg/dL (calc) — ABNORMAL HIGH (ref <130)
Total CHOL/HDL Ratio: 3.6 (calc) (ref <5.0)
Triglycerides: 153 mg/dL — ABNORMAL HIGH (ref <150)

## 2020-02-02 LAB — TEST AUTHORIZATION

## 2020-02-02 LAB — CBC
HCT: 42.2 % (ref 38.5–50.0)
Hemoglobin: 13.8 g/dL (ref 13.2–17.1)
MCH: 29.1 pg (ref 27.0–33.0)
MCHC: 32.7 g/dL (ref 32.0–36.0)
MCV: 89 fL (ref 80.0–100.0)
MPV: 9.6 fL (ref 7.5–12.5)
Platelets: 236 10*3/uL (ref 140–400)
RBC: 4.74 10*6/uL (ref 4.20–5.80)
RDW: 14.5 % (ref 11.0–15.0)
WBC: 6.6 10*3/uL (ref 3.8–10.8)

## 2020-02-02 LAB — COMPLETE METABOLIC PANEL WITH GFR
AG Ratio: 1.5 (calc) (ref 1.0–2.5)
ALT: 8 U/L — ABNORMAL LOW (ref 9–46)
AST: 14 U/L (ref 10–35)
Albumin: 4 g/dL (ref 3.6–5.1)
Alkaline phosphatase (APISO): 67 U/L (ref 35–144)
BUN/Creatinine Ratio: 17 (calc) (ref 6–22)
BUN: 19 mg/dL (ref 7–25)
CO2: 28 mmol/L (ref 20–32)
Calcium: 9.4 mg/dL (ref 8.6–10.3)
Chloride: 107 mmol/L (ref 98–110)
Creat: 1.15 mg/dL — ABNORMAL HIGH (ref 0.70–1.11)
GFR, Est African American: 69 mL/min/{1.73_m2} (ref >=60)
GFR, Est Non African American: 59 mL/min/{1.73_m2} — ABNORMAL LOW (ref >=60)
Globulin: 2.6 g/dL (calc) (ref 1.9–3.7)
Glucose, Bld: 94 mg/dL (ref 65–99)
Potassium: 4.3 mmol/L (ref 3.5–5.3)
Sodium: 142 mmol/L (ref 135–146)
Total Bilirubin: 0.8 mg/dL (ref 0.2–1.2)
Total Protein: 6.6 g/dL (ref 6.1–8.1)

## 2020-02-02 LAB — HEMOGLOBIN A1C W/OUT EAG: Hgb A1c MFr Bld: 5.4 % of total Hgb (ref <5.7)

## 2020-02-04 DIAGNOSIS — M79671 Pain in right foot: Secondary | ICD-10-CM | POA: Diagnosis not present

## 2020-02-04 DIAGNOSIS — M19071 Primary osteoarthritis, right ankle and foot: Secondary | ICD-10-CM | POA: Diagnosis not present

## 2020-02-04 DIAGNOSIS — Z96661 Presence of right artificial ankle joint: Secondary | ICD-10-CM

## 2020-02-04 DIAGNOSIS — M25571 Pain in right ankle and joints of right foot: Secondary | ICD-10-CM | POA: Diagnosis not present

## 2020-02-04 DIAGNOSIS — Z9889 Other specified postprocedural states: Secondary | ICD-10-CM

## 2020-02-04 HISTORY — DX: Other specified postprocedural states: Z98.890

## 2020-02-04 HISTORY — DX: Presence of right artificial ankle joint: Z96.661

## 2020-02-22 ENCOUNTER — Other Ambulatory Visit: Payer: Self-pay

## 2020-02-22 MED ORDER — LEVOTHYROXINE SODIUM 75 MCG PO TABS: 75.0000 ug | ORAL_TABLET | Freq: Every day | ORAL | 1 refills | Status: DC

## 2020-03-17 ENCOUNTER — Other Ambulatory Visit: Payer: Self-pay | Admitting: Cardiology

## 2020-03-17 NOTE — Telephone Encounter (Signed)
Prescription refill request for Eliquis received.  Last office visit: [place], 01/11/2020 Scr: 1.15, 01/28/2020 Age: 79 y.o. Weight: 87.5 kg   Prescription refill sent.

## 2020-03-27 NOTE — Progress Notes (Signed)
Referring Provider: [name], NP Primary Care Physician:  [name], NP Primary GI: Dr. Gala Romney    Chief Complaint  Patient presents with  . Follow-up    hiatal hernia, trouble swallowing cold liquids    HPI:   Brent Wilkins is an 79 y.o. male presenting today with a history of esophagitis, history of Schaztki's ring s/p dilation in Feb 2019, large hiatal hernia, without solid food dysphagia but noting liquid dysphagia specifically with cold water. Several incidences of epigastric pain/non-cardiac chest pain over past few years, with patient concerned about hiatal hernia at last visit. He underwent BPE, which showed esophageal motility disorder. He does not have normal primary peristalsis and noted severe tertiary, or irregular contractions. Declined manometry in the past.   Had an episode of epigastric discomfort in July. Sat up and had big dose of Mylanta with improvement. No postprandial pain. Cold fluids result in dysphagia. Chews well with meats. Feels cold liquids close up and causes discomfort in esophagus. Will stretch his neck out and relax. Has changed to eating small bites, chewing well. No unexplained weight loss or lack of appetite.   Past Medical History:  Diagnosis Date  . Allergy    hay fever;  seasonal  . Arthritis    right ankle  . Cataract    surgery 2016  . Chronic kidney disease    stones many years ago  . Dizziness 12/13/2015  . Dysphagia   . Family history of malignant neoplasm of prostate 12/12/2017  . Frequent urination at night   . GERD (gastroesophageal reflux disease)   . History of kidney stones   . History of prostate cancer 06/16/2017  . HTN (hypertension)   . Hyperlipidemia LDL goal <100 10/26/2019  . Hypothyroidism   . 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 Neuropathy    Bilateral ankles  . Paroxysmal atrial fibrillation (HCC)    a. on Tikosyn and Eliquis  . Polyneuropathy 06/16/2017  . Prostate cancer [facility]) 2010   Dr.  Rosana Hoes  . Schatzki's ring 10/22/2011  . Seasonal allergies   . Syncope  dx. 8 yrs ago  . Vaccine counseling 09/15/2019    Past Surgical History:  Procedure Laterality Date  . APPENDECTOMY    . ATRIAL FIBRILLATION ABLATION N/A 10/06/2019   Procedure: ATRIAL FIBRILLATION ABLATION;  Surgeon: Constance Haw, MD;  Location: [place] [facility];  Service: Cardiovascular;  Laterality: N/A;  . CATARACT EXTRACTION W/PHACO Left 03/27/2015   Procedure: CATARACT EXTRACTION PHACO AND INTRAOCULAR LENS PLACEMENT LEFT EYE CDE=11.73;  Surgeon: Tonny Branch, MD;  Location: [facility];  Service: Ophthalmology;  Laterality: Left;  . CATARACT EXTRACTION W/PHACO Right 03/30/2015   Procedure: CATARACT EXTRACTION PHACO AND INTRAOCULAR LENS PLACEMENT RIGHT EYE CDE=7.35;  Surgeon: Tonny Branch, MD;  Location: [facility];  Service: Ophthalmology;  Laterality: Right;  . CHOLECYSTECTOMY    . COLONOSCOPY  07/23/10   Dr. Vivi Ferns rectum, scattered pancolonic diverticula  . COLONOSCOPY  08/10/1999   internal hemorrhoids,inflammatory polyp  . ESOPHAGOGASTRODUODENOSCOPY  04/12/08   Prominent Schatzki's ring, erosive reflux esophagitis, multiple antral erosions, small hiatal hernia, reactive gastropathy, status post dilation with 66F  . ESOPHAGOGASTRODUODENOSCOPY  10/28/2011   Procedure: ESOPHAGOGASTRODUODENOSCOPY (EGD);  Surgeon: Daneil Dolin, MD;  [facility];  Service: Endoscopy;  Laterality: N/A;  1:30  . ESOPHAGOGASTRODUODENOSCOPY N/A 09/10/2017   Dr. Gala Romney: Schatkzi's ring s/p dilation at GE junction, mild esophageal reflux esophagitis, medium sized  hiatal hernia, normal duodenum. 56 and 87 Fr dilation  . EYE SURGERY    . JOINT REPLACEMENT N/A    Phreesia 01/15/2020  . MALONEY DILATION  10/28/2011   Procedure: Venia Minks DILATION;  Surgeon: Daneil Dolin, MD;  [facility];  Service: Endoscopy;  Laterality: N/A;  . Venia Minks DILATION N/A 09/10/2017   Procedure: Venia Minks DILATION;  Surgeon: Daneil Dolin, MD;   [facility];  Service: Endoscopy;  Laterality: N/A;  . SAVORY DILATION  10/28/2011   Procedure: SAVORY DILATION;  Surgeon: Daneil Dolin, MD;  [facility];  Service: Endoscopy;  Laterality: N/A;  . TONSILLECTOMY    . TOTAL ANKLE ARTHROPLASTY Left 11/25/2013   Procedure: TOTAL ANKLE ARTHOPLASTY;  Surgeon: Wylene Simmer, MD;  Location: [place];  Service: Orthopedics;  Laterality: Left;  . TOTAL ANKLE ARTHROPLASTY Right 07/14/2014   dr hewitt  . TOTAL ANKLE ARTHROPLASTY Right 07/14/2014   Procedure: RIGHT TOTAL ANKLE ARTHOPLASTY;  Surgeon: Wylene Simmer, MD;  Location: [facility];  Service: Orthopedics;  Laterality: Right;    Current Outpatient Medications  Medication Sig Dispense Refill  . amLODipine (NORVASC) 5 MG tablet TAKE 1 TABLET BY MOUTH  DAILY 90 tablet 0  . celecoxib (CELEBREX) 200 MG capsule Take 200 mg by mouth as needed for mild pain.     . diphenhydramine-acetaminophen (TYLENOL PM) 25-500 MG TABS tablet Take 1 tablet by mouth at bedtime as needed (sleep and mild discomfort).     . docusate sodium (COLACE) 100 MG capsule Take 100 mg by mouth as needed for mild constipation.    . dofetilide (TIKOSYN) 500 MCG capsule Take 1 capsule (500 mcg total) by mouth 2 (two) times daily. 180 capsule 2  . ELIQUIS 5 MG TABS tablet TAKE 1 TABLET BY MOUTH  TWICE DAILY 180 tablet 1  . finasteride (PROSCAR) 5 MG tablet Take 5 mg by mouth daily.    . fluticasone (FLONASE) 50 MCG/ACT nasal spray Place 1 spray into both nostrils daily as needed for allergies. 48 g 3  . gabapentin (NEURONTIN) 300 MG capsule Take 300 mg by mouth 2 (two) times daily.     Marland Kitchen ibuprofen (ADVIL,MOTRIN) 200 MG tablet Take 400 mg by mouth as needed for headache (headache/pain.).     Marland Kitchen levothyroxine (SYNTHROID) 75 MCG tablet Take 1 tablet (75 mcg total) by mouth daily. 90 tablet 1  . loratadine (CLARITIN) 10 MG tablet Take 10 mg by mouth daily as needed for allergies.    Marland Kitchen losartan (COZAAR) 50 MG tablet TAKE 1 TABLET  BY MOUTH  DAILY 90 tablet 0  . Omega-3 Fatty Acids (RA FISH OIL) 1400 MG CPDR Take 1,400 mg by mouth daily.    . pantoprazole (PROTONIX) 40 MG tablet TAKE 1 TABLET BY MOUTH  DAILY BEFORE BREAKFAST 90 tablet 3  . Polyethyl Glycol-Propyl Glycol (SYSTANE) 0.4-0.3 % SOLN Place 1 drop into both eyes 2 (two) times daily as needed (dry eyes).     . Tamsulosin HCl (FLOMAX) 0.4 MG CAPS Take 0.4 mg by mouth daily after breakfast.     . vitamin B-12 (CYANOCOBALAMIN) 1000 MCG tablet Take 1,000 mcg by mouth daily.     No current facility-administered medications for this visit.    Allergies as of 03/28/2020 - Review Complete 03/28/2020  Allergen Reaction Noted  . Tetanus toxoids Swelling 05/10/2013  . Statins Other (See Comments) 10/12/2012  . Dicyclomine Other (See Comments) 12/21/2018  . Sulfamethoxazole  09/26/2011  . Doxycycline Rash  09/30/2011  . Oxycodone Other (See Comments) 07/06/2014  . Penicillins Swelling and Rash 09/26/2011  . Sulfa antibiotics Swelling and Rash 10/22/2011  . Xarelto [rivaroxaban] Rash 06/16/2017    Family History  Problem Relation Age of Onset  . Prostate cancer Father   . Cancer Father        prostate to bone  . Hypertension Father   . Prostate cancer Son 29  . ALS Son   . Prostate cancer Brother   . Heart disease Mother        died at 48  . Arthritis Mother   . Colon cancer Neg Hx     Social History   Socioeconomic History  . Marital status: Married    Spouse name: Candace  . Number of children: 2  . Years of education: Not on file  . Highest education level: Bachelor's degree (e.g., BA, AB, BS)  Occupational History  . Occupation: retired; Ambulance person: RETIRED  Tobacco Use  . Smoking status: Never Smoker  . Smokeless tobacco: Never Used  Vaping Use  . Vaping Use: Never used  Substance and Sexual Activity  . Alcohol use: Yes    Alcohol/week: 2.0 standard drinks    Types: 1 Glasses of wine, 1 Cans of beer per week     Comment: glass of wine or "couple of beers" occasionally   . Drug use: No  . Sexual activity: Yes    Partners: Female  Other Topics Concern  . Not on file  Social History Narrative   Lives with wife Candace.       Moved from [place], [place]   Retired from [employer].   Plays golf.   Works with [employer] and [employer] for [employer].   Wears seatbelt.   Drives.   Eats all food groups.   Never smoked.   Married for over 53 years June  (2020)   Has two grown children, live in [place] and [place].   Social Determinants of Health   Financial Resource Strain: Low Risk   . Difficulty of Paying Living Expenses: Not hard at all  Food Insecurity: No Food Insecurity  . Worried About Charity fundraiser in the Last Year: Never true  . Ran Out of Food in the Last Year: Never true  Transportation Needs: No Transportation Needs  . Lack of Transportation (Medical): No  . Lack of Transportation (Non-Medical): No  Physical Activity: Sufficiently Active  . Days of Exercise per Week: 7 days  . Minutes of Exercise per Session: 60 min  Stress: No Stress Concern Present  . Feeling of Stress : Not at all  Social Connections: Socially Integrated  . Frequency of Communication with Friends and Family: More than three times a week  . Frequency of Social Gatherings with Friends and Family: More than three times a week  . Attends Religious Services: More than 4 times per year  . Active Member of Clubs or Organizations: Yes  . Attends Archivist Meetings: More than 4 times per year  . Marital Status: Married    Review of Systems: Gen: Denies fever, chills, anorexia. Denies fatigue, weakness, weight loss.  CV: Denies chest pain, palpitations, syncope, peripheral edema, and claudication. Resp: Denies dyspnea at rest, cough, wheezing, coughing up blood, and pleurisy. GI: see HPI Derm: Denies rash, itching, dry skin Psych: Denies depression, anxiety, memory loss, confusion. No homicidal or  suicidal ideation.  Heme: Denies bruising, bleeding, and enlarged lymph nodes.  Physical Exam: BP (!) 142/86   Pulse 71   Temp (!) 97 F (36.1 C) (Other (Comment))   Ht 5\' 10"  (1.778 m)   Wt 203 lb 6.4 oz (92.3 kg)   BMI 29.18 kg/m  General:   Alert and oriented. No distress noted. Pleasant and cooperative.  Head:  Normocephalic and atraumatic. Eyes:  Conjuctiva clear without scleral icterus. Mouth:  Mask in place Abdomen:  +BS, soft, non-tender and non-distended. No rebound or guarding. No HSM or masses noted. Msk:  Symmetrical without gross deformities. Normal posture. Extremities:  Without edema. Neurologic:  Alert and  oriented x4 Psych:  Alert and cooperative. Normal mood and affect.  ASSESSMENT: VEDANT SHEHADEH is an 79 y.o. male presenting today with history of esophagitis, Schatzki's ring s/p dilation Feb 2019, known large hiatal hernia, known esophageal dysmotility and severe tertiary contractions, at baseline with symptoms and tolerating diet. No solid food dysphagia and has reported only liquid dysphagia if cold items.   Interestingly, he noted an episode of epigastric pain that he attributes to large hiatal hernia, resolving after drinking Mylanta. I discussed with him seeking medical attention if recurrent and unrelenting pain. He has no persistent epigastric pain, denies solid food dysphagia, and has no alarm signs/symptoms. As he is overall doing well, will see him back in 1 year. He is to call if recurrent solid food dysphagia or epigastric pain.    PLAN:  Continue Protonix daily  Return in 1 year  Call if any changes in interim  Annitta Needs, PhD, [facility] Gastroenterology

## 2020-03-28 ENCOUNTER — Ambulatory Visit: Payer: Medicare Other | Admitting: Gastroenterology

## 2020-03-28 ENCOUNTER — Other Ambulatory Visit: Payer: Self-pay

## 2020-03-28 ENCOUNTER — Encounter: Payer: Self-pay | Admitting: Gastroenterology

## 2020-03-28 VITALS — BP 142/86 | HR 71 | Temp 97.0°F | Ht 70.0 in | Wt 203.4 lb

## 2020-03-28 DIAGNOSIS — R1310 Dysphagia, unspecified: Secondary | ICD-10-CM | POA: Diagnosis not present

## 2020-03-28 DIAGNOSIS — K219 Gastro-esophageal reflux disease without esophagitis: Secondary | ICD-10-CM

## 2020-03-28 NOTE — Progress Notes (Signed)
CC'ED TO PCP 

## 2020-03-28 NOTE — Patient Instructions (Addendum)
We will see you back in 1 year!  Please seek medical attention if severe abdominal pain. If you have worsening issues with swallowing, please let us know.  Continue with Protonix once daily, 30 minutes before breakfast.  I enjoyed seeing you again today! As you know, I value our relationship and want to provide genuine, compassionate, and quality care. I welcome your feedback. If you receive a survey regarding your visit,  I greatly appreciate you taking time to fill this out. See you next time!  Annitta Needs, PhD, ANP-BC [facility] Gastroenterology

## 2020-04-18 ENCOUNTER — Encounter: Payer: Self-pay | Admitting: Cardiology

## 2020-04-18 ENCOUNTER — Other Ambulatory Visit: Payer: Self-pay

## 2020-04-18 ENCOUNTER — Ambulatory Visit: Payer: Medicare Other | Admitting: Cardiology

## 2020-04-18 VITALS — BP 136/88 | HR 75 | Ht 70.0 in | Wt 204.0 lb

## 2020-04-18 DIAGNOSIS — I4819 Other persistent atrial fibrillation: Secondary | ICD-10-CM

## 2020-04-18 MED ORDER — DOFETILIDE 500 MCG PO CAPS: 500.0000 ug | ORAL_CAPSULE | Freq: Two times a day (BID) | ORAL | 2 refills | Status: DC

## 2020-04-18 NOTE — Progress Notes (Signed)
Electrophysiology Office Note   Date:  04/18/2020   ID:  Brent, Wilkins [birth year], MRN [id number]  PCP:  [name], NP  Cardiologist:  Harrington Challenger Primary Electrophysiologist:  Saphira Lahmann [name], MD    No chief complaint on file.    History of Present Illness: Brent Wilkins is a 79 y.o. male who is being seen today for the evaluation of atrial fibrillation at the request of [name], NP. Presenting today for electrophysiology evaluation.  He has a history significant for hypertension, paroxysmal atrial fibrillation, and hyperlipidemia.  He was hospitalized in April 2020 with an episode of syncope.  He was found bradycardic and thus was not on AV nodal blockers.  At the time of hospitalization he was found to be orthostatic.  In review of his ECGs, he was noted to have intermittent episodes of sinus rhythm and thus he was admitted to the hospital for Tikosyn load.  He did well for quite some time until he had more frequent episodes of atrial fibrillation and now status post ablation 10/06/2019.  Today, denies symptoms of palpitations, chest pain, shortness of breath, orthopnea, PND, lower extremity edema, claudication, dizziness, presyncope, syncope, bleeding, or neurologic sequela. The patient is tolerating medications without difficulties.  Since last being seen he has done well.  He has no chest pain or shortness of breath.  He is noted no further episodes of atrial fibrillation.  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 on Tikosyn and Eliquis   Polyneuropathy 06/16/2017   Prostate cancer (HCC) 2010   Dr. Rosana Hoes   Schatzki's ring 10/22/2011   Seasonal allergies    Syncope  dx. 8 yrs ago   Vaccine counseling 09/15/2019   Past Surgical History:  Procedure Laterality Date   APPENDECTOMY     ATRIAL FIBRILLATION ABLATION N/A 10/06/2019   Procedure: ATRIAL FIBRILLATION ABLATION;  Surgeon: Constance Haw, MD;  Location: [place] [facility];  Service: Cardiovascular;  Laterality: N/A;   CATARACT EXTRACTION W/PHACO Left 03/27/2015   Procedure: CATARACT EXTRACTION PHACO AND INTRAOCULAR LENS PLACEMENT LEFT EYE CDE=11.73;  Surgeon: Tonny Branch, MD;  Location: [facility];  Service: Ophthalmology;  Laterality: Left;   CATARACT EXTRACTION W/PHACO Right 03/30/2015   Procedure: CATARACT EXTRACTION PHACO AND INTRAOCULAR LENS PLACEMENT RIGHT EYE CDE=7.35;  Surgeon: Tonny Branch, MD;  Location: [facility];  Service: Ophthalmology;  Laterality: Right;   CHOLECYSTECTOMY     COLONOSCOPY  07/23/10   Dr. Vivi Ferns rectum, scattered pancolonic diverticula   COLONOSCOPY  08/10/1999   internal hemorrhoids,inflammatory polyp   ESOPHAGOGASTRODUODENOSCOPY  04/12/08   Prominent Schatzki's ring, erosive reflux esophagitis, multiple antral erosions, small hiatal hernia, reactive gastropathy, status post dilation with 56F   ESOPHAGOGASTRODUODENOSCOPY  10/28/2011   Procedure: ESOPHAGOGASTRODUODENOSCOPY (  EGD);  Surgeon: Daneil Dolin, MD;  [facility];  Service: Endoscopy;  Laterality: N/A;  1:30   ESOPHAGOGASTRODUODENOSCOPY N/A 09/10/2017   Dr. Gala Romney: Schatkzi's ring s/p dilation at GE junction, mild esophageal reflux esophagitis, medium sized hiatal hernia, normal duodenum. 56 and 58 Fr dilation   EYE SURGERY     JOINT REPLACEMENT N/A    Phreesia 01/15/2020   MALONEY DILATION   10/28/2011   Procedure: Venia Minks DILATION;  Surgeon: Daneil Dolin, MD;  [facility];  Service: Endoscopy;  Laterality: N/A;   MALONEY DILATION N/A 09/10/2017   Procedure: Venia Minks DILATION;  Surgeon: Daneil Dolin, MD;  [facility];  Service: Endoscopy;  Laterality: N/A;   SAVORY DILATION  10/28/2011   Procedure: SAVORY DILATION;  Surgeon: Daneil Dolin, MD;  [facility];  Service: Endoscopy;  Laterality: N/A;   TONSILLECTOMY     TOTAL ANKLE ARTHROPLASTY Left 11/25/2013   Procedure: TOTAL ANKLE ARTHOPLASTY;  Surgeon: Wylene Simmer, MD;  Location: [place];  Service: Orthopedics;  Laterality: Left;   TOTAL ANKLE ARTHROPLASTY Right 07/14/2014   dr hewitt   TOTAL ANKLE ARTHROPLASTY Right 07/14/2014   Procedure: RIGHT TOTAL ANKLE ARTHOPLASTY;  Surgeon: Wylene Simmer, MD;  Location: [place];  Service: Orthopedics;  Laterality: Right;     Current Outpatient Medications  Medication Sig Dispense Refill   amLODipine (NORVASC) 5 MG tablet TAKE 1 TABLET BY MOUTH  DAILY 90 tablet 0   celecoxib (CELEBREX) 200 MG capsule Take 200 mg by mouth as needed for mild pain.      diphenhydramine-acetaminophen (TYLENOL PM) 25-500 MG TABS tablet Take 1 tablet by mouth at bedtime as needed (sleep and mild discomfort).      docusate sodium (COLACE) 100 MG capsule Take 100 mg by mouth as needed for mild constipation.     dofetilide (TIKOSYN) 500 MCG capsule Take 1 capsule (500 mcg total) by mouth 2 (two) times daily. 180 capsule 2   ELIQUIS 5 MG TABS tablet TAKE 1 TABLET BY MOUTH  TWICE DAILY 180 tablet 1   finasteride (PROSCAR) 5 MG tablet Take 5 mg by mouth daily.     fluticasone (FLONASE) 50 MCG/ACT nasal spray Place 1 spray into both nostrils daily as needed for allergies. 48 g 3   gabapentin (NEURONTIN) 300 MG capsule Take 300 mg by mouth 2 (two) times daily.      ibuprofen (ADVIL,MOTRIN) 200 MG tablet Take 400 mg by mouth as needed for headache (headache/pain.).       levothyroxine (SYNTHROID) 75 MCG tablet Take 1 tablet (75 mcg total) by mouth daily. 90 tablet 1   loratadine (CLARITIN) 10 MG tablet Take 10 mg by mouth daily as needed for allergies.     losartan (COZAAR) 50 MG tablet TAKE 1 TABLET BY MOUTH  DAILY 90 tablet 0   Omega-3 Fatty Acids (RA FISH OIL) 1400 MG CPDR Take 1,400 mg by mouth daily.     pantoprazole (PROTONIX) 40 MG tablet TAKE 1 TABLET BY MOUTH  DAILY BEFORE BREAKFAST 90 tablet 3   Polyethyl Glycol-Propyl Glycol (SYSTANE) 0.4-0.3 % SOLN Place 1 drop into both eyes 2 (two) times daily as needed (dry eyes).      Tamsulosin HCl (FLOMAX) 0.4 MG CAPS Take 0.4 mg by mouth daily after breakfast.      vitamin B-12 (CYANOCOBALAMIN) 1000 MCG tablet Take 1,000 mcg by mouth daily.     No current facility-administered medications for this visit.  Allergies:   Tetanus toxoids, Statins, Dicyclomine, Sulfamethoxazole, Doxycycline, Oxycodone, Penicillins, Sulfa antibiotics, and Xarelto [rivaroxaban]   Social History:  The patient  reports that he has never smoked. He has never used smokeless tobacco. He reports current alcohol use of about 2.0 standard drinks of alcohol per week. He reports that he does not use drugs.   Family History:  The patient's family history includes ALS in his son; Arthritis in his mother; Cancer in his father; Heart disease in his mother; Hypertension in his father; Prostate cancer in his brother and father; Prostate cancer (age of onset: 29) in his son.   ROS:  Please see the history of present illness.   Otherwise, review of systems is positive for none.   All other systems are reviewed and negative.   PHYSICAL EXAM: VS:  BP 136/88    Pulse 75    Ht 5\' 10"  (1.778 m)    Wt 204 lb (92.5 kg)    SpO2 99%    BMI 29.27 kg/m  , BMI Body mass index is 29.27 kg/m. GEN: Well nourished, well developed, in no acute distress  HEENT: normal  Neck: no JVD, carotid bruits, or masses Cardiac: RRR; no murmurs, rubs, or gallops,no  edema  Respiratory:  clear to auscultation bilaterally, normal work of breathing GI: soft, nontender, nondistended, + BS MS: no deformity or atrophy  Skin: warm and dry Neuro:  Strength and sensation are intact Psych: euthymic mood, full affect  EKG:  EKG is ordered today. Personal review of the ekg ordered shows sinus rhythm, right bundle branch block, PACs and PVCs  Recent Labs: 10/19/2019: TSH 2.68 11/03/2019: Magnesium 2.0 01/28/2020: ALT 8; BUN 19; Creat 1.15; Hemoglobin 13.8; Platelets 236; Potassium 4.3; Sodium 142    Lipid Panel     Component Value Date/Time   CHOL 181 01/28/2020 0952   TRIG 153 (H) 01/28/2020 0952   HDL 50 01/28/2020 0952   CHOLHDL 3.6 01/28/2020 0952   VLDL 30 09/22/2017 1444   LDLCALC 104 (H) 01/28/2020 0952     Wt Readings from Last 3 Encounters:  04/18/20 204 lb (92.5 kg)  03/28/20 203 lb 6.4 oz (92.3 kg)  01/31/20 193 lb (87.5 kg)      Other studies Reviewed: Additional studies/ records that were reviewed today include: 30 day monitor 04/28/17 - personally reviewed  Review of the above records today demonstrates:   Atrial fibrillation and flutter seen throughout study.  Multiple pauses up to 3 seconds with most occurring during early morning hours (presumably asleep) and one occurring at approximately 1 pm.  TTE 03/21/17 - Left ventricle: The cavity size was normal. Wall thickness was   increased in a pattern of moderate LVH. Systolic function was   normal. The estimated ejection fraction was in the range of 60%   to 65%. Wall motion was normal; there were no regional wall   motion abnormalities. Doppler parameters are consistent with   restrictive physiology, indicative of decreased left ventricular   diastolic compliance and/or increased left atrial pressure.   Doppler parameters are consistent with high ventricular filling   pressure. - Aortic valve: Moderately calcified annulus. Trileaflet. - Mitral valve: Mildly calcified  annulus. - Left atrium: The atrium was severely dilated. - Right ventricle: Systolic function was mildly reduced. - Right atrium: The atrium was mildly to moderately dilated. - Tricuspid valve: There was mild-moderate regurgitation. - Pulmonary arteries: Incomplete TR jet to accurately estimated   PASP.  ASSESSMENT AND PLAN:  1. Persistent  atrial fibrillation/atrial flutter: Currently on Eliquis and dofetilide (monitoring performed with ECG for high risk medication).  CHA2DS2-VASc of 3. Status post ablation 10/06/2019.  He remains in sinus rhythm with occasional PVCs.  No changes.  2. Syncope: Continue to have episodes of near syncope. This is greatly improved since his ablation.  Has not had any further episodes since his last visit.   3. Hypertension: Mildly elevated today but usually better controlled.  No changes.  Current medicines are reviewed at length with the patient today.   The patient does not have concerns regarding his medicines.  The following changes were made today: None  Labs/ tests ordered today include:  Orders Placed This Encounter  Procedures   EKG 12-Lead     Disposition:   FU with Kelcey Wickstrom 6 months  Signed, Nataliah Hatlestad [name], MD  04/18/2020 2:43 PM     [place] [address] [place] [address] [phone] (office) [phone] (fax)

## 2020-04-18 NOTE — Patient Instructions (Signed)
Medication Instructions:  Your physician recommends that you continue on your current medications as directed. Please refer to the Current Medication list given to you today.  *If you need a refill on your cardiac medications before your next appointment, please call your pharmacy*   Lab Work: None ordered If you have labs (blood work) drawn today and your tests are completely normal, you will receive your results only by: . MyChart Message (if you have MyChart) OR . A paper copy in the mail If you have any lab test that is abnormal or we need to change your treatment, we will call you to review the results.   Testing/Procedures: None ordered   Follow-Up: At [facility], you and your health needs are our priority.  As part of our continuing mission to provide you with exceptional heart care, we have created designated Provider Care Teams.  These Care Teams include your primary Cardiologist (physician) and Advanced Practice Providers (APPs -  Physician Assistants and Nurse Practitioners) who all work together to provide you with the care you need, when you need it.  We recommend signing up for the patient portal called "MyChart".  Sign up information is provided on this After Visit Summary.  MyChart is used to connect with patients for Virtual Visits (Telemedicine).  Patients are able to view lab/test results, encounter notes, upcoming appointments, etc.  Non-urgent messages can be sent to your provider as well.   To learn more about what you can do with MyChart, go to [email or web address].    Your next appointment:   6 month(s)  The format for your next appointment:   In Person  Provider:   Will Camnitz, MD   Thank you for choosing [facility]!!   Umaiza Matusik, RN [phone]    Other Instructions    

## 2020-04-19 DIAGNOSIS — D225 Melanocytic nevi of trunk: Secondary | ICD-10-CM | POA: Diagnosis not present

## 2020-04-19 DIAGNOSIS — X32XXXD Exposure to sunlight, subsequent encounter: Secondary | ICD-10-CM | POA: Diagnosis not present

## 2020-04-19 DIAGNOSIS — L570 Actinic keratosis: Secondary | ICD-10-CM | POA: Diagnosis not present

## 2020-04-20 ENCOUNTER — Telehealth: Payer: Self-pay | Admitting: Family Medicine

## 2020-04-20 NOTE — Telephone Encounter (Signed)
No, I usually like to wait until the appt to see what needs to be check.

## 2020-04-20 NOTE — Telephone Encounter (Signed)
Patient called and would like to know if he needs blood work done before his physical on 9/23

## 2020-04-21 NOTE — Telephone Encounter (Signed)
Pt advised of recommendation with verbal understanding  

## 2020-04-27 ENCOUNTER — Ambulatory Visit (INDEPENDENT_AMBULATORY_CARE_PROVIDER_SITE_OTHER): Payer: Medicare Other | Admitting: Family Medicine

## 2020-04-27 ENCOUNTER — Other Ambulatory Visit: Payer: Self-pay

## 2020-04-27 ENCOUNTER — Encounter: Payer: Self-pay | Admitting: Family Medicine

## 2020-04-27 VITALS — BP 140/86 | HR 67 | Temp 97.3°F | Resp 18 | Ht 70.0 in | Wt 201.8 lb

## 2020-04-27 DIAGNOSIS — I10 Essential (primary) hypertension: Secondary | ICD-10-CM

## 2020-04-27 DIAGNOSIS — I4891 Unspecified atrial fibrillation: Secondary | ICD-10-CM

## 2020-04-27 DIAGNOSIS — N182 Chronic kidney disease, stage 2 (mild): Secondary | ICD-10-CM

## 2020-04-27 DIAGNOSIS — E785 Hyperlipidemia, unspecified: Secondary | ICD-10-CM | POA: Diagnosis not present

## 2020-04-27 DIAGNOSIS — C61 Malignant neoplasm of prostate: Secondary | ICD-10-CM | POA: Diagnosis not present

## 2020-04-27 DIAGNOSIS — Z0001 Encounter for general adult medical examination with abnormal findings: Secondary | ICD-10-CM | POA: Diagnosis not present

## 2020-04-27 DIAGNOSIS — Z23 Encounter for immunization: Secondary | ICD-10-CM | POA: Diagnosis not present

## 2020-04-27 DIAGNOSIS — G250 Essential tremor: Secondary | ICD-10-CM

## 2020-04-27 DIAGNOSIS — G629 Polyneuropathy, unspecified: Secondary | ICD-10-CM

## 2020-04-27 DIAGNOSIS — E039 Hypothyroidism, unspecified: Secondary | ICD-10-CM

## 2020-04-27 HISTORY — DX: Encounter for general adult medical examination with abnormal findings: Z00.01

## 2020-04-27 NOTE — Patient Instructions (Addendum)
I appreciate the opportunity to provide you with care for your health and wellness. Today we discussed: overall health   Follow up: 6 months or as needed  Labs-fasting at [facility] No referrals today  Great to see you! I hope you have a great Fall and Winter!  Call if you need anything!  Please continue to practice social distancing to keep you, your family, and our community safe.  If you must go out, please wear a mask and practice good handwashing.  It was a pleasure to see you and I look forward to continuing to work together on your health and well-being. Please do not hesitate to call the office if you need care or have questions about your care.  Have a wonderful day and week. With Gratitude, Cherly Beach, DNP, AGNP-BC

## 2020-04-27 NOTE — Assessment & Plan Note (Signed)

## 2020-04-27 NOTE — Progress Notes (Addendum)
Health Maintenance reviewed -  Immunization History  Administered Date(s) Administered  . Fluad Quad(high Dose 65+) 04/27/2019, 04/27/2020  . Influenza, High Dose Seasonal PF 05/07/2017, 06/12/2018  . Moderna SARS-COVID-2 Vaccination 08/17/2019, 09/17/2019  . Pneumococcal Conjugate-13 06/16/2017  . Pneumococcal Polysaccharide-23 01/02/2018  . Zoster Recombinat (Shingrix) 04/07/2018, 09/22/2018   Last colonoscopy: N/A Last PSA: Spring of 2021- followed by urology  Dentist: yearly Ophtho: yearly  Exercise: Walks the dog daily Smoker: no  Alcohol Use: some wine during the week  Other doctors caring for patient include:  Patient Care Team: [name], NP as PCP - General Curt Bears, Ocie Doyne, MD as PCP - Electrophysiology (Cardiology) Fay Records, MD as PCP - Cardiology (Cardiology) Gala Romney Cristopher Estimable, MD (Gastroenterology) Myrlene Broker, MD as Attending Physician (Urology)  End of Life Discussion:  Patient does not have a living will and medical power of attorney  Subjective:   HPI  Brent Wilkins is a 79 y.o. male who presents for annual visit and follow-up on chronic medical conditions.  He has the following concerns: He would like to talk about the neuropathy in his feet he reports that everything is coming getting worse.  In addition to his tremor that he has had since he was younger.  He reports the neuropathy in his feet is really bad and he has to make sure that he is mindful with his walking.  He is unsure of why it is gotten worse.  He does have a history of diabetes.  Is on vitamin B12 but was not sure if he needed to continue to be on that.  In addition to neuropathy control.  He reports that his essential tremor has given him increased tremoring per him over the last several weeks to months.  There is nothing has been adjusted or changed.  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  However he does have heart condition which places him at higher risk for having certain medications to help with tremors.  He would like to hold off for now on any medications and possibly follow-up in the near future with a neurologist as needed.  However he was advised that if this were any specific nerve changes that were permanent that nothing could be done most likely but a referral for consultation given her.  He denies having any sleep trouble.  But he and his wife who are here today for the visit report that they take Tylenol PM at night.  He stopped taking melatonin when he found out that it was bad for his Eliquis.  He is unable to stop Eliquis secondary to having atrial fibrillation so he is maintained Tylenol PM for a while.  He is made aware of the side effects and risk of being on a sedated by medicine such as Benadryl.  And advised that if he can sleep without it to try to do so.  He denies having any trouble chewing or swallowing.  Has had his esophagus stretched for several times.  Overall does very well with this as long as he does not drink or eat anything cold.  He sees a Pharmacist, community regularly.  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  He denies having any changes in bowel or bladder habits.  He is followed closely by urology secondary to having prostate cancer.  He denies having any blood in urine or stool.  He reports that  his memory is good and is not having any excessive changes there.  He denies having any recent falls.  He denies having any skin issues.  Is followed by dermatology for skin cancer removals.  Denies having any hearing trouble hearing aids are working well.  Denies having any issues with his classes will see the eye doctor here in a short few weeks.  Secondary to his atrial fibrillation and he had an ablation earlier in the year since then he has been doing very well.  He asked if he could come off Tikosyn to his cardiologist/EP provider but they said since he is  doing well to leave it for now.  He says that he is going to revisit this at his next appointment as this medicine and Eliquis put him in a donut hole very early in the year.  He does report that his headaches have gotten a little bit worse.  He had a sinus infection earlier in the year.  He reports a cold cloth and laying down will sometimes be very helpful.  He does take Tylenol quite a bit questionable whether or not they rebound related.  Educated on the fact that Tylenol can cause rebound headaches and for him to see if he can get rid of it with caffeine and will call back before trying to take any medication.  He does have the appointment coming up with his eye doctor as during this conversation about headaches he reports that he had some lenses removed and when he put his new lenses and the depth perception was off.  This could be the culprit of his more recent migraines as a mild change.  He has always suffered from headaches and migraines in his history.  He denies having any chest pain, leg swelling, palpitations, cough, new shortness of breath, dizziness or vision changes.  He is going to get a flu shot today in the office.  Review Of Systems  Review of Systems  Constitutional: Negative.   HENT: Negative.   Eyes: Negative.   Respiratory: Negative.   Cardiovascular: Negative.   Gastrointestinal: Negative.   Endocrine: Negative.   Genitourinary: Negative.   Musculoskeletal: Negative.   Skin: Negative.   Neurological: Positive for headaches.       Neuropathy  Psychiatric/Behavioral: Negative.     Objective:   PHYSICAL EXAM:  BP 140/86 (BP Location: Right Arm, Patient Position: Sitting, Cuff Size: Normal)   Pulse 67   Temp (!) 97.3 F (36.3 C) (Temporal)   Resp 18   Ht 5\' 10"  (1.778 m)   Wt 201 lb 12.8 oz (91.5 kg)   SpO2 93%   BMI 28.96 kg/m   Physical Exam Vitals and nursing note reviewed.  Constitutional:      Appearance: Normal appearance. He is well-developed,  well-groomed and overweight.  HENT:     Head: Normocephalic and atraumatic.     Right Ear: Tympanic membrane, ear canal and external ear normal. Decreased hearing noted.     Left Ear: Tympanic membrane, ear canal and external ear normal. Decreased hearing noted.     Ears:     Comments: Hearing aids present     Nose: Nose normal.     Mouth/Throat:     Lips: Pink.     Mouth: Mucous membranes are moist.     Pharynx: Oropharynx is clear. Uvula midline.  Eyes:     General:        Right eye: No discharge.  Left eye: No discharge.     Extraocular Movements: Extraocular movements intact.     Conjunctiva/sclera: Conjunctivae normal.     Pupils: Pupils are equal, round, and reactive to light.     Comments: Glasses  Neck:     Thyroid: No thyroid mass, thyromegaly or thyroid tenderness.     Vascular: No carotid bruit.  Cardiovascular:     Rate and Rhythm: Normal rate and regular rhythm.     Pulses: Normal pulses.          Dorsalis pedis pulses are 2+ on the right side and 2+ on the left side.       Posterior tibial pulses are 2+ on the right side and 2+ on the left side.     Heart sounds: Normal heart sounds.  Pulmonary:     Effort: Pulmonary effort is normal.     Breath sounds: Normal breath sounds and air entry.  Abdominal:     General: Bowel sounds are normal. There is no distension.     Palpations: Abdomen is soft.     Tenderness: There is no abdominal tenderness. There is no right CVA tenderness or left CVA tenderness.     Hernia: No hernia is present.  Musculoskeletal:        General: Normal range of motion.     Cervical back: Full passive range of motion without pain, normal range of motion and neck supple.     Right lower leg: No edema.     Left lower leg: No edema.     Comments: MAE, ROM intact   Feet:     Right foot:     Skin integrity: Skin integrity normal.     Left foot:     Skin integrity: Skin integrity normal.  Lymphadenopathy:     Cervical: No cervical  adenopathy.  Skin:    General: Skin is warm and dry.     Capillary Refill: Capillary refill takes less than 2 seconds.  Neurological:     General: No focal deficit present.     Mental Status: He is alert and oriented to person, place, and time.     Cranial Nerves: Cranial nerves are intact.     Sensory: Sensation is intact.     Motor: Motor function is intact.     Coordination: Coordination is intact.     Gait: Gait is intact.     Deep Tendon Reflexes: Reflexes are normal and symmetric.  Psychiatric:        Attention and Perception: Attention and perception normal.        Mood and Affect: Mood and affect normal.        Speech: Speech normal.        Behavior: Behavior normal. Behavior is cooperative.        Thought Content: Thought content normal.        Cognition and Memory: Cognition and memory normal.        Judgment: Judgment normal.     Comments: Good communication, good eye contact pleasant throughout visit       Depression Screening  Depression screen [facility] 2/9 04/27/2020 09/23/2019 09/15/2019 06/15/2019 04/27/2019  Decreased Interest 0 0 0 0 0  Down, Depressed, Hopeless 0 0 0 0 0  PHQ - 2 Score 0 0 0 0 0  Altered sleeping 0 - - - -  Tired, decreased energy 3 - - - -  Change in appetite 0 - - - -  Feeling bad or failure about  yourself  0 - - - -  Trouble concentrating 0 - - - -  Moving slowly or fidgety/restless 0 - - - -  Suicidal thoughts 0 - - - -  PHQ-9 Score 3 - - - -  Difficult doing work/chores Not difficult at all - - - -     Falls  Fall Risk  04/27/2020 01/31/2020 10/26/2019 09/23/2019 09/15/2019  Falls in the past year? 0 0 1 0 0  Number falls in past yr: 0 0 1 0 0  Injury with Fall? 0 0 0 0 0  Risk Factor Category  - - - - -  Risk for fall due to : No Fall Risks - - - -  Follow up Falls evaluation completed - - Falls evaluation completed;Education provided;Falls prevention discussed -    Assessment & Plan:   1. Annual visit for general adult medical  examination with abnormal findings   2. Atrial fibrillation, unspecified type ([place])   3. Malignant neoplasm of prostate ([name])   4. Hyperlipidemia LDL goal <100   5. HTN, goal below 140/90   6. Hypothyroidism, unspecified type   7. Polyneuropathy   8. Benign essential tremor   9. 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 Flu Vaccine QUAD High Dose(Fluad)  . CBC  . Comprehensive metabolic panel  . Hemoglobin A1c  . Lipid panel  . VITAMIN D 25 Hydroxy (Vit-D Deficiency, Fractures)  . Vitamin B12  . TSH     Plan: Please see assessment and plan per problem list above.   No orders of the defined types were placed in this encounter.  I have personally reviewed: The patient's medical and social history Their use of alcohol, tobacco or illicit drugs Their current medications and supplements The patient's functional ability including ADLs,fall risks, home safety risks, cognitive, and hearing and visual impairment Diet and physical activities Evidence for depression or mood disorders  The patient's weight, height, BMI, and visual acuity have been recorded in the chart.  I have made referrals, counseling, and provided education to the patient based on review of the above and I have provided the patient with a written personalized care plan for preventive services.    Note: This dictation was prepared with Dragon dictation along with smaller phrase technology. Similar sounding words can be transcribed inadequately or may not be corrected upon review. Any transcriptional errors that result from this process are unintentional.      [name], NP   04/27/2020

## 2020-04-27 NOTE — Assessment & Plan Note (Signed)
Brent Wilkins is encouraged to maintain a well balanced diet that is low in salt. Controlled, continue current medication regimen.  Followed by Cardiology Additionally, he is also reminded that exercise is beneficial for heart health and control of  Blood pressure. 30-60 minutes daily is recommended-walking was suggested.

## 2020-04-27 NOTE — Assessment & Plan Note (Signed)
Reports this is worsened.  After review medications or side effect plan to avoid starting anything secondary to his heart condition as well.  He reports that when he actively looks at his hand he can stop it.  This is promising however he is having more trouble pouring and lifting things with his hands particularly.  Could consider OT therapy as I do not think there is a safe medication at this time for him. Patient acknowledged agreement and understanding of the plan.

## 2020-04-27 NOTE — Assessment & Plan Note (Signed)
Updated labs ordered. Encouraged to avoid nephrotoxic drugs. And to keep blood pressure well under control.

## 2020-04-27 NOTE — Assessment & Plan Note (Signed)
Followed closely by urology.  They check his PSAs regularly.  He has a follow-up in November

## 2020-04-27 NOTE — Assessment & Plan Note (Signed)
Getting updated labs.  Currently is on fish oil has had a sensitivity to statins in the past.  I encouraged him to continue a low-fat heart healthy diet.

## 2020-04-27 NOTE — Assessment & Plan Note (Signed)
Discussed PSA screening (risks/benefits), recommended at least 30 minutes of aerobic activity at least 5 days/week; proper sunscreen use reviewed; healthy diet and alcohol recommendations (less than or equal to 2 drinks/day) reviewed; regular seatbelt use; changing batteries in smoke detectors. Immunization recommendations discussed.  Colonoscopy recommendations reviewed. ° °

## 2020-04-27 NOTE — Assessment & Plan Note (Signed)
Start taking Neurontin right at bedtime.  If needed we will increase the dose.  Has he reports that his symptoms are worse at night.  Will be checking his B12 level to adjust if needed.

## 2020-04-27 NOTE — Assessment & Plan Note (Signed)
Is on Synthroid will be getting TSH level adjustment as needed.  No signs or symptoms of hypothyroidism today in office.

## 2020-04-27 NOTE — Assessment & Plan Note (Signed)
Doing well since ablation. Followed by Cards continue all medications as per them Of note he would like to come off Tikosyn if he can as back in Eliquis allergic since medications.

## 2020-04-28 DIAGNOSIS — G250 Essential tremor: Secondary | ICD-10-CM | POA: Diagnosis not present

## 2020-04-28 DIAGNOSIS — G629 Polyneuropathy, unspecified: Secondary | ICD-10-CM | POA: Diagnosis not present

## 2020-04-28 DIAGNOSIS — E785 Hyperlipidemia, unspecified: Secondary | ICD-10-CM | POA: Diagnosis not present

## 2020-04-28 DIAGNOSIS — R739 Hyperglycemia, unspecified: Secondary | ICD-10-CM | POA: Diagnosis not present

## 2020-04-28 DIAGNOSIS — R1310 Dysphagia, unspecified: Secondary | ICD-10-CM | POA: Diagnosis not present

## 2020-04-28 DIAGNOSIS — Z0001 Encounter for general adult medical examination with abnormal findings: Secondary | ICD-10-CM | POA: Diagnosis not present

## 2020-04-28 DIAGNOSIS — I4891 Unspecified atrial fibrillation: Secondary | ICD-10-CM | POA: Diagnosis not present

## 2020-04-28 DIAGNOSIS — N182 Chronic kidney disease, stage 2 (mild): Secondary | ICD-10-CM | POA: Diagnosis not present

## 2020-04-28 DIAGNOSIS — K219 Gastro-esophageal reflux disease without esophagitis: Secondary | ICD-10-CM | POA: Diagnosis not present

## 2020-04-28 DIAGNOSIS — I10 Essential (primary) hypertension: Secondary | ICD-10-CM | POA: Diagnosis not present

## 2020-04-29 LAB — CBC
Hematocrit: 43.5 % (ref 37.5–51.0)
Hemoglobin: 14.6 g/dL (ref 13.0–17.7)
MCH: 29.9 pg (ref 26.6–33.0)
MCHC: 33.6 g/dL (ref 31.5–35.7)
MCV: 89 fL (ref 79–97)
Platelets: 247 10*3/uL (ref 150–450)
RBC: 4.88 x10E6/uL (ref 4.14–5.80)
RDW: 14.1 % (ref 11.6–15.4)
WBC: 7.2 10*3/uL (ref 3.4–10.8)

## 2020-04-29 LAB — COMPREHENSIVE METABOLIC PANEL
ALT: 7 IU/L (ref 0–44)
AST: 16 IU/L (ref 0–40)
Albumin/Globulin Ratio: 1.6 (ref 1.2–2.2)
Albumin: 4.3 g/dL (ref 3.6–4.6)
Alkaline Phosphatase: 82 IU/L (ref 44–121)
BUN/Creatinine Ratio: 11 (ref 10–24)
BUN: 15 mg/dL (ref 8–27)
Bilirubin Total: 0.5 mg/dL (ref 0.0–1.2)
CO2: 24 mmol/L (ref 20–29)
Calcium: 9.5 mg/dL (ref 8.6–10.2)
Chloride: 105 mmol/L (ref 96–106)
Creatinine, Ser: 1.38 mg/dL — ABNORMAL HIGH (ref 0.76–1.27)
GFR calc Af Amer: 55 mL/min/{1.73_m2} — ABNORMAL LOW (ref >59)
GFR calc non Af Amer: 48 mL/min/{1.73_m2} — ABNORMAL LOW (ref >59)
Globulin, Total: 2.7 g/dL (ref 1.5–4.5)
Glucose: 108 mg/dL — ABNORMAL HIGH (ref 65–99)
Potassium: 4.2 mmol/L (ref 3.5–5.2)
Sodium: 142 mmol/L (ref 134–144)
Total Protein: 7 g/dL (ref 6.0–8.5)

## 2020-04-29 LAB — LIPID PANEL
Chol/HDL Ratio: 4.7 ratio (ref 0.0–5.0)
Cholesterol, Total: 200 mg/dL — ABNORMAL HIGH (ref 100–199)
HDL: 43 mg/dL (ref >39)
LDL Chol Calc (NIH): 121 mg/dL — ABNORMAL HIGH (ref 0–99)
Triglycerides: 203 mg/dL — ABNORMAL HIGH (ref 0–149)
VLDL Cholesterol Cal: 36 mg/dL (ref 5–40)

## 2020-04-29 LAB — HEMOGLOBIN A1C
Est. average glucose Bld gHb Est-mCnc: 111 mg/dL
Hgb A1c MFr Bld: 5.5 % (ref 4.8–5.6)

## 2020-04-29 LAB — PSA: Prostate Specific Ag, Serum: 1.9 ng/mL (ref 0.0–4.0)

## 2020-04-29 LAB — VITAMIN B12: Vitamin B-12: >2000 pg/mL — ABNORMAL HIGH (ref 232–1245)

## 2020-04-29 LAB — VITAMIN D 25 HYDROXY (VIT D DEFICIENCY, FRACTURES): Vit D, 25-Hydroxy: 32.3 ng/mL (ref 30.0–100.0)

## 2020-04-29 LAB — TSH: TSH: 3.94 u[IU]/mL (ref 0.450–4.500)

## 2020-05-04 ENCOUNTER — Other Ambulatory Visit: Payer: Self-pay

## 2020-05-04 DIAGNOSIS — N182 Chronic kidney disease, stage 2 (mild): Secondary | ICD-10-CM

## 2020-05-09 DIAGNOSIS — M19071 Primary osteoarthritis, right ankle and foot: Secondary | ICD-10-CM | POA: Diagnosis not present

## 2020-05-19 DIAGNOSIS — N182 Chronic kidney disease, stage 2 (mild): Secondary | ICD-10-CM | POA: Diagnosis not present

## 2020-05-20 LAB — CMP14+EGFR
ALT: 8 IU/L (ref 0–44)
AST: 18 IU/L (ref 0–40)
Albumin/Globulin Ratio: 1.7 (ref 1.2–2.2)
Albumin: 4 g/dL (ref 3.6–4.6)
Alkaline Phosphatase: 79 IU/L (ref 44–121)
BUN/Creatinine Ratio: 11 (ref 10–24)
BUN: 14 mg/dL (ref 8–27)
Bilirubin Total: 0.4 mg/dL (ref 0.0–1.2)
CO2: 24 mmol/L (ref 20–29)
Calcium: 9.2 mg/dL (ref 8.6–10.2)
Chloride: 110 mmol/L — ABNORMAL HIGH (ref 96–106)
Creatinine, Ser: 1.23 mg/dL (ref 0.76–1.27)
GFR calc Af Amer: 63 mL/min/{1.73_m2} (ref >59)
GFR calc non Af Amer: 55 mL/min/{1.73_m2} — ABNORMAL LOW (ref >59)
Globulin, Total: 2.4 g/dL (ref 1.5–4.5)
Glucose: 99 mg/dL (ref 65–99)
Potassium: 5.1 mmol/L (ref 3.5–5.2)
Sodium: 144 mmol/L (ref 134–144)
Total Protein: 6.4 g/dL (ref 6.0–8.5)

## 2020-05-22 ENCOUNTER — Encounter: Payer: Self-pay | Admitting: Family Medicine

## 2020-05-23 NOTE — Telephone Encounter (Signed)
Pt advised that this would be followed at next visit the labs were only ordered to check kidney function this go round with verbal understanding

## 2020-05-23 NOTE — Telephone Encounter (Signed)
I wouldn't check cholesterol levels within a month back to back. I think there must have been some confusion.

## 2020-05-23 NOTE — Telephone Encounter (Signed)
The result from sept  I see Caryl Pina called I do not see a result note on the last lab to call pt with

## 2020-05-23 NOTE — Telephone Encounter (Signed)
It does not look like he has been called with my result note. Please call today if able. Thank you kindly.

## 2020-05-25 ENCOUNTER — Other Ambulatory Visit: Payer: Self-pay | Admitting: Internal Medicine

## 2020-06-28 ENCOUNTER — Encounter: Payer: Self-pay | Admitting: Internal Medicine

## 2020-07-06 ENCOUNTER — Encounter: Payer: Self-pay | Admitting: General Practice

## 2020-07-06 ENCOUNTER — Ambulatory Visit: Payer: Medicare Other | Admitting: General Practice

## 2020-07-06 ENCOUNTER — Other Ambulatory Visit: Payer: Self-pay

## 2020-07-06 ENCOUNTER — Ambulatory Visit: Payer: Medicare Other | Admitting: Family Medicine

## 2020-07-06 VITALS — BP 146/82 | HR 90 | Ht 70.0 in | Wt 202.0 lb

## 2020-07-06 DIAGNOSIS — I10 Essential (primary) hypertension: Secondary | ICD-10-CM | POA: Diagnosis not present

## 2020-07-06 DIAGNOSIS — R5383 Other fatigue: Secondary | ICD-10-CM | POA: Diagnosis not present

## 2020-07-06 DIAGNOSIS — I4819 Other persistent atrial fibrillation: Secondary | ICD-10-CM

## 2020-07-06 NOTE — Progress Notes (Signed)
Cardiology Clinic Note   Patient Name: Brent Wilkins Date of Encounter: 07/06/2020  Primary Care Provider:  [name], NP Primary Cardiologist:  Dorris Carnes, MD  Patient Profile    Brent Wilkins 79 year old male presents the clinic today for an evaluation of his atrial fibrillation.  Past Medical History    Past Medical History:  Diagnosis Date  . Allergy    hay fever;  seasonal  . Arthritis    right ankle  . Cataract    surgery 2016  . Chronic kidney disease    stones many years ago  . Constipation 12/09/2018  . Dizziness 12/13/2015  . Dysphagia   . Family history of malignant neoplasm of prostate 12/12/2017  . Frequent urination at night   . GERD (gastroesophageal reflux disease)   . History of arthroplasty of right ankle 02/04/2020  . History of kidney stones   . History of prostate cancer 06/16/2017  . HTN (hypertension)   . Hyperlipidemia LDL goal <100 10/26/2019  . Hypertension    Phreesia 04/24/2020  . Hypothyroidism   . 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 Neuropathy    Bilateral ankles  . Paroxysmal atrial fibrillation (HCC)    a. on Tikosyn and Eliquis  . Polyneuropathy 06/16/2017  . 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 Prostate cancer [facility]) 2010   Dr. Rosana Hoes  . Schatzki's ring 10/22/2011  . Seasonal allergies   . Syncope  dx. 8 yrs ago  . Syncope, cardiogenic 03/20/2017  . Vaccine counseling 09/15/2019   Past Surgical History:  Procedure Laterality Date  . APPENDECTOMY    . ATRIAL FIBRILLATION ABLATION N/A 10/06/2019   Procedure: ATRIAL FIBRILLATION ABLATION;  Surgeon: Constance Haw, MD;  Location: [facility];  Service: Cardiovascular;  Laterality: N/A;  . CATARACT EXTRACTION W/PHACO Left 03/27/2015   Procedure: CATARACT EXTRACTION PHACO AND INTRAOCULAR LENS PLACEMENT LEFT EYE CDE=11.73;  Surgeon: Tonny Branch, MD;  Location: [facility];  Service: Ophthalmology;  Laterality: Left;  . CATARACT EXTRACTION W/PHACO Right 03/30/2015     Procedure: CATARACT EXTRACTION PHACO AND INTRAOCULAR LENS PLACEMENT RIGHT EYE CDE=7.35;  Surgeon: Tonny Branch, MD;  Location: [facility];  Service: Ophthalmology;  Laterality: Right;  . CHOLECYSTECTOMY    . COLONOSCOPY  07/23/10   Dr. Vivi Ferns rectum, scattered pancolonic diverticula  . COLONOSCOPY  08/10/1999   internal hemorrhoids,inflammatory polyp  . ESOPHAGOGASTRODUODENOSCOPY  04/12/08   Prominent Schatzki's ring, erosive reflux esophagitis, multiple antral erosions, small hiatal hernia, reactive gastropathy, status post dilation with 53F  . ESOPHAGOGASTRODUODENOSCOPY  10/28/2011   Procedure: ESOPHAGOGASTRODUODENOSCOPY (EGD);  Surgeon: Daneil Dolin, MD;  [facility]: [facility];  Service: Endoscopy;  Laterality: N/A;  1:30  . ESOPHAGOGASTRODUODENOSCOPY N/A 09/10/2017   Dr. Gala Romney: Schatkzi's ring s/p dilation at GE junction, mild esophageal reflux esophagitis, medium sized hiatal hernia, normal duodenum. 56 and 30 Fr dilation  . EYE SURGERY    . JOINT REPLACEMENT N/A    Phreesia 01/15/2020  . MALONEY DILATION  10/28/2011   Procedure: Venia Minks DILATION;  Surgeon: Daneil Dolin, MD;  [facility];  Service: Endoscopy;  Laterality: N/A;  . Venia Minks DILATION N/A 09/10/2017   Procedure: Venia Minks DILATION;  Surgeon: Daneil Dolin, MD;  [facility];  Service: Endoscopy;  Laterality: N/A;  . SAVORY DILATION  10/28/2011   Procedure: SAVORY DILATION;  Surgeon: Daneil Dolin, MD;  [facility];  Service: Endoscopy;  Laterality: N/A;  .  TONSILLECTOMY    . TOTAL ANKLE ARTHROPLASTY Left 11/25/2013   Procedure: TOTAL ANKLE ARTHOPLASTY;  Surgeon: Wylene Simmer, MD;  Location: [facility];  Service: Orthopedics;  Laterality: Left;  . TOTAL ANKLE ARTHROPLASTY Right 07/14/2014   dr hewitt  . TOTAL ANKLE ARTHROPLASTY Right 07/14/2014   Procedure: RIGHT TOTAL ANKLE ARTHOPLASTY;  Surgeon: Wylene Simmer, MD;  Location: [facility];  Service: Orthopedics;  Laterality: Right;     Allergies  Allergies  Allergen Reactions  . Tetanus Toxoids Swelling    Face swelling  . Statins Other (See Comments)    Hard to walk, severe leg cramps Other reaction(s): Other (See Comments) Muscle weakness "unable to walk"  . Dicyclomine Other (See Comments)    constipation  . Sulfamethoxazole     Other reaction(s): Other (See Comments) unknown  . Doxycycline Rash    Rash only  . Oxycodone Other (See Comments)    Severe constipation  . Penicillins Swelling and Rash    Took as a child, developed rash over time Has patient had a PCN reaction causing immediate rash, facial/tongue/throat swelling, SOB or lightheadedness with hypotension: Yes Has patient had a PCN reaction causing severe rash involving mucus membranes or skin necrosis: Unknown Has patient had a PCN reaction that required hospitalization: No Has patient had a PCN reaction occurring within the last 10 years: No If all of the above answers are "NO", then may proceed with Cephalosporin use.  Other reaction(s): Other (See Comments) unknown  . Sulfa Antibiotics Swelling and Rash  . Xarelto [Rivaroxaban] Rash    rash    History of Present Illness    Brent Wilkins has a PMH of atrial fibrillation, hypertension, dysphagia, GERD, hypothyroidism, CKD stage II, and hyperlipidemia.  He was hospitalized 4/20 with an episode of syncope.  He was found to be bradycardic and not a candidate for AV nodal blocking agents.  He was also found to be orthostatic.  He was noted to have intermittent periods of sinus rhythm and was admitted to the hospital for Tikosyn loading.  He was noted to do well for quite some time until he developed recurrent episodes of atrial fibrillation.  He underwent A. fib ablation 10/06/2019.  He was last seen by Dr. Curt Bears on 04/18/2020.  At that time he denied symptoms of palpitations, chest pain, shortness of breath, orthopnea, PND, lower extremity swelling, claudication, dizziness presyncope and  neurological sequela.  He was tolerating his medications well.  He reported doing well since his previous EP visit.  He denied further episodes of atrial fibrillation.  Contacted nurse triage line today 07/06/2020 and indicated he was having a episode of accelerated heart rate.  He presents the clinic today for follow-up evaluation and states he has had increased heart rate over the past several days.  He reports increase stress with his son living at their house for the last 2 weeks who has ALS and is nonverbal.  He also has increased stress [place]  [place]olf course was recently purchased and is not being maintained.  He reports staying away from caffeine, maintaining hydration, eating well, and continuing to be very physically active.  He reports walking his dog 1 mile per day.  We reviewed his EKG which shows no acute changes.  Due to increased fatigue over the last several days I will order an echocardiogram, check lab work, and have him follow-up in 1 month.  Today he denies chest pain, shortness of breath, lower extremity edema, fatigue,  palpitations, melena, hematuria, hemoptysis, diaphoresis, weakness, presyncope, syncope, orthopnea, and PND.  Home Medications    Prior to Admission medications   Medication Sig Start Date End Date Taking? Authorizing Provider  amLODipine (NORVASC) 5 MG tablet TAKE 1 TABLET BY MOUTH  DAILY 05/25/20   Fay Records, MD  diphenhydramine-acetaminophen (TYLENOL PM) 25-500 MG TABS tablet Take 1 tablet by mouth at bedtime as needed (sleep and mild discomfort).     [provider]  dofetilide (TIKOSYN) 500 MCG capsule Take 1 capsule (500 mcg total) by mouth 2 (two) times daily. 04/18/20   Camnitz, Will Hassell Done, MD  ELIQUIS 5 MG TABS tablet TAKE 1 TABLET BY MOUTH  TWICE DAILY 03/17/20   Camnitz, Ocie Doyne, MD  finasteride (PROSCAR) 5 MG tablet Take 5 mg by mouth daily.    [provider]  fluticasone (FLONASE) 50 MCG/ACT nasal  spray Place 1 spray into both nostrils daily as needed for allergies. 09/30/19   [name], NP  gabapentin (NEURONTIN) 300 MG capsule Take 300 mg by mouth 2 (two) times daily.  04/19/15   [provider]  ibuprofen (ADVIL,MOTRIN) 200 MG tablet Take 400 mg by mouth as needed for headache (headache/pain.).     [provider]  levothyroxine (SYNTHROID) 75 MCG tablet Take 1 tablet (75 mcg total) by mouth daily. 02/22/20   [name], NP  loratadine (CLARITIN) 10 MG tablet Take 10 mg by mouth daily as needed for allergies.    [provider]  losartan (COZAAR) 50 MG tablet TAKE 1 TABLET BY MOUTH  DAILY 05/25/20   Fay Records, MD  Omega-3 Fatty Acids (RA FISH OIL) 1400 MG CPDR Take 1,400 mg by mouth daily.    [provider]  pantoprazole (PROTONIX) 40 MG tablet TAKE 1 TABLET BY MOUTH  DAILY BEFORE BREAKFAST 09/01/19   Carlis Stable, NP  Polyethyl Glycol-Propyl Glycol (SYSTANE) 0.4-0.3 % SOLN Place 1 drop into both eyes 2 (two) times daily as needed (dry eyes).     [provider]  Tamsulosin HCl (FLOMAX) 0.4 MG CAPS Take 0.4 mg by mouth daily after breakfast.     [provider]  vitamin B-12 (CYANOCOBALAMIN) 1000 MCG tablet Take 1,000 mcg by mouth daily.    [provider]    Family History    Family History  Problem Relation Age of Onset  . Prostate cancer Father   . Cancer Father        prostate to bone  . Hypertension Father   . Prostate cancer Son 64  . ALS Son   . Prostate cancer Brother   . Heart disease Mother        died at 5  . Arthritis Mother   . Colon cancer Neg Hx    He indicated that his mother is deceased. He indicated that his father is deceased. He indicated that his brother is alive. He indicated that his maternal grandmother is deceased. He indicated that his maternal grandfather is deceased. He indicated that his paternal grandmother is deceased. He indicated that his paternal grandfather is  deceased. He indicated that his daughter is alive. He indicated that his son is alive. He indicated that the status of his neg hx is unknown.  Social History    Social History   Socioeconomic History  . Marital status: Married    Spouse name: Brent Wilkins  . Number of children: 2  . Years of education: Not on file  . Highest education  level: Bachelor's degree (e.g., BA, AB, BS)  Occupational History  . Occupation: retired; Ambulance person: RETIRED  Tobacco Use  . Smoking status: Never Smoker  . Smokeless tobacco: Never Used  Vaping Use  . Vaping Use: Never used  Substance and Sexual Activity  . Alcohol use: Yes    Alcohol/week: 2.0 standard drinks    Types: 1 Glasses of wine, 1 Cans of beer per week    Comment: glass of wine or "couple of beers" occasionally   . Drug use: No  . Sexual activity: Yes    Partners: Female  Other Topics Concern  . Not on file  Social History Narrative   Lives with wife Brent Wilkins.       Moved from [place], [place]   Retired from [employer].   Plays golf.   Works with [employer] and [employer] for [employer].   Wears seatbelt.   Drives.   Eats all food groups.   Never smoked.   Married for over 33 years June  (2020)   Has two grown children, live in [place] and [place].   Social Determinants of Health   Financial Resource Strain: Low Risk   . Difficulty of Paying Living Expenses: Not hard at all  Food Insecurity: No Food Insecurity  . Worried About Charity fundraiser in the Last Year: Never true  . Ran Out of Food in the Last Year: Never true  Transportation Needs: No Transportation Needs  . Lack of Transportation (Medical): No  . Lack of Transportation (Non-Medical): No  Physical Activity: Sufficiently Active  . Days of Exercise per Week: 7 days  . Minutes of Exercise per Session: 60 min  Stress: No Stress Concern Present  . Feeling of Stress : Not at all  Social Connections: Socially Integrated  . Frequency of Communication  with Friends and Family: More than three times a week  . Frequency of Social Gatherings with Friends and Family: More than three times a week  . Attends Religious Services: More than 4 times per year  . Active Member of Clubs or Organizations: Yes  . Attends Archivist Meetings: More than 4 times per year  . Marital Status: Married  Human resources officer Violence:   . Fear of Current or Ex-Partner: Not on file  . Emotionally Abused: Not on file  . Physically Abused: Not on file  . Sexually Abused: Not on file     Review of Systems    General:  No chills, fever, night sweats or weight changes.  Cardiovascular:  No chest pain, dyspnea on exertion, edema, orthopnea, palpitations, paroxysmal nocturnal dyspnea. Dermatological: No rash, lesions/masses Respiratory: No cough, dyspnea Urologic: No hematuria, dysuria Abdominal:   No nausea, vomiting, diarrhea, bright red blood per rectum, melena, or hematemesis Neurologic:  No visual changes, wkns, changes in mental status. All other systems reviewed and are otherwise negative except as noted above.  Physical Exam    VS:  BP (!) 146/82   Pulse 90   Ht 5\' 10"  (1.778 m)   Wt 202 lb (91.6 kg)   SpO2 97%   BMI 28.98 kg/m  , BMI Body mass index is 28.98 kg/m. GEN: Well nourished, well developed, in no acute distress. HEENT: normal. Neck: Supple, no JVD, carotid bruits, or masses. Cardiac: RRR, no murmurs, rubs, or gallops. No clubbing, cyanosis, edema.  Radials/DP/PT 2+ and equal bilaterally.  Respiratory:  Respirations regular and unlabored, clear to auscultation bilaterally. GI: Soft, nontender, nondistended,  BS + x 4. MS: no deformity or atrophy. Skin: warm and dry, no rash. Neuro:  Strength and sensation are intact. Psych: Normal affect.  Accessory Clinical Findings    Recent Labs: 11/03/2019: Magnesium 2.0 04/28/2020: Hemoglobin 14.6; Platelets 247; TSH 3.940 05/19/2020: ALT 8; BUN 14; Creatinine, Ser 1.23; Potassium 5.1;  Sodium 144   Recent Lipid Panel    Component Value Date/Time   CHOL 200 (H) 04/28/2020 0834   TRIG 203 (H) 04/28/2020 0834   HDL 43 04/28/2020 0834   CHOLHDL 4.7 04/28/2020 0834   CHOLHDL 3.6 01/28/2020 0952   VLDL 30 09/22/2017 1444   LDLCALC 121 (H) 04/28/2020 0834   LDLCALC 104 (H) 01/28/2020 0952    ECG personally reviewed by me today-sinus rhythm with first-degree block right bundle branch block 81 bpm- No acute changes  EKG 04/18/2020 Sinus rhythm right bundle branch block, PVCs PACs 75 bpm  Echocardiogram 03/21/2017 Study Conclusions   - Left ventricle: The cavity size was normal. Wall thickness was  increased in a pattern of moderate LVH. Systolic function was  normal. The estimated ejection fraction was in the range of 60%  to 65%. Wall motion was normal; there were no regional wall  motion abnormalities. Doppler parameters are consistent with  restrictive physiology, indicative of decreased left ventricular  diastolic compliance and/or increased left atrial pressure.  Doppler parameters are consistent with high ventricular filling  pressure.  - Aortic valve: Moderately calcified annulus. Trileaflet.  - Mitral valve: Mildly calcified annulus.  - Left atrium: The atrium was severely dilated.  - Right ventricle: Systolic function was mildly reduced.  - Right atrium: The atrium was mildly to moderately dilated.  - Tricuspid valve: There was mild-moderate regurgitation.  - Pulmonary arteries: Incomplete TR jet to accurately estimated  PASP.   Assessment & Plan   1.  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  Status post ablation procedure 10/06/2019.  Contacted nurse triage line today 07/06/20 and indicated that he was having episodes of accelerated heart rate.  Reports compliance and no missed doses of Eliquis.  Reports increased stress over the past 2 weeks related to his son who has  ALS and is nonverbal also has increased stress with his property which [place] that is not being maintained.   Continue Tikosyn, Eliquis Avoid triggers caffeine, chocolate, EtOH etc. Maintain p.o. hydration Heart healthy low-sodium diet-salty 6 given   Essential hypertension-BP today 146/82. Has been well controlled at home. Continue amlodipine, losartan Heart healthy low-sodium diet-salty 6 given Increase physical activity as tolerated  Fatigue- over the last few days has noticed an increased HR and increased fatigue.  Has had increased stress with his son who has ALS being at his home for the last 2 weeks.  He is nonverbal.  Appears to be multifactorial in nature related to increased heart rate and stress. Order echocardiogram Heart healthy low-sodium diet Maintain physical activity Maintain p.o. hydration Mindfulness stress reduction sheet given  Disposition: Follow-up  in 1 month.   Jossie Ng. [place] Sigmon NP-C    07/06/2020, 1:32 PM [facility] [place] [address] Office [phone] Fax [phone]  Notice: This dictation was prepared with Dragon dictation along with smaller phrase technology. Any transcriptional errors that result from this process are unintentional and may not be corrected upon review.

## 2020-07-06 NOTE — Patient Instructions (Addendum)
Mindfulness-Based Stress Reduction Mindfulness-based stress reduction (MBSR) is a program that helps people learn to practice mindfulness. Mindfulness is the practice of intentionally paying attention to the present moment. It can be learned and practiced through techniques such as education, breathing exercises, meditation, and yoga. MBSR includes several mindfulness techniques in one program. MBSR works best when you understand the treatment, are willing to try new things, and can commit to spending time practicing what you learn. MBSR training may include learning about:  How your emotions, thoughts, and reactions affect your body.  New ways to respond to things that cause negative thoughts to start (triggers).  How to notice your thoughts and let go of them.  Practicing awareness of everyday things that you normally do without thinking.  The techniques and goals of different types of meditation. What are the benefits of MBSR? MBSR can have many benefits, which include helping you to:  Develop self-awareness. This refers to knowing and understanding yourself.  Learn skills and attitudes that help you to participate in your own health care.  Learn new ways to care for yourself.  Be more accepting about how things are, and let things go.  Be less judgmental and approach things with an open mind.  Be patient with yourself and trust yourself more. MBSR has also been shown to:  Reduce negative emotions, such as depression and anxiety.  Improve memory and focus.  Change how you sense and approach pain.  Boost your body's ability to fight infections.  Help you connect better with other people.  Improve your sense of well-being. Follow these instructions at home:   Find a local in-person or online MBSR program.  Set aside some time regularly for mindfulness practice.  Find a mindfulness practice that works best for you. This may include one or more of the  following: ? Meditation. Meditation involves focusing your mind on a certain thought or activity. ? Breathing awareness exercises. These help you to stay present by focusing on your breath. ? Body scan. For this practice, you lie down and pay attention to each part of your body from head to toe. You can identify tension and soreness and intentionally relax parts of your body. ? Yoga. Yoga involves stretching and breathing, and it can improve your ability to move and be flexible. It can also provide an experience of testing your body's limits, which can help you release stress. ? Mindful eating. This way of eating involves focusing on the taste, texture, color, and smell of each bite of food. Because this slows down eating and helps you feel full sooner, it can be an important part of a weight-loss plan.  Find a podcast or recording that provides guidance for breathing awareness, body scan, or meditation exercises. You can listen to these any time when you have a free moment to rest without distractions.  Follow your treatment plan as told by your health care provider. This may include taking regular medicines and making changes to your diet or lifestyle as recommended. How to practice mindfulness To do a basic awareness exercise:  Find a comfortable place to sit.  Pay attention to the present moment. Observe your thoughts, feelings, and surroundings just as they are.  Avoid placing judgment on yourself, your feelings, or your surroundings. Make note of any judgment that comes up, and let it go.  Your mind may wander, and that is okay. Make note of when your thoughts drift, and return your attention to the present moment. To do   basic mindfulness meditation:  Find a comfortable place to sit. This may include a stable chair or a firm floor cushion. ? Sit upright with your back straight. Let your arms fall next to your side with your hands resting on your legs. ? If sitting in a chair, rest your  feet flat on the floor. ? If sitting on a cushion, cross your legs in front of you.  Keep your head in a neutral position with your chin dropped slightly. Relax your jaw and rest the tip of your tongue on the roof of your mouth. Drop your gaze to the floor. You can close your eyes if you like.  Breathe normally and pay attention to your breath. Feel the air moving in and out of your nose. Feel your belly expanding and relaxing with each breath.  Your mind may wander, and that is okay. Make note of when your thoughts drift, and return your attention to your breath.  Avoid placing judgment on yourself, your feelings, or your surroundings. Make note of any judgment or feelings that come up, let them go, and bring your attention back to your breath.  When you are ready, lift your gaze or open your eyes. Pay attention to how your body feels after the meditation. Where to find more information You can find more information about MBSR from:  Your health care provider.  Community-based meditation centers or programs.  Programs offered near you. Summary  Mindfulness-based stress reduction (MBSR) is a program that teaches you how to intentionally pay attention to the present moment. It is used with other treatments to help you cope better with daily stress, emotions, and pain.  MBSR focuses on developing self-awareness, which allows you to respond to life stress without judgment or negative emotions.  MBSR programs may involve learning different mindfulness practices, such as breathing exercises, meditation, yoga, body scan, or mindful eating. Find a mindfulness practice that works best for you, and set aside time for it on a regular basis. This information is not intended to replace advice given to you by your health care provider. Make sure you discuss any questions you have with your health care provider. Document Revised: 07/04/2017 Document Reviewed: 11/28/2016 Elsevier Patient Education   [place].    Medication Instructions:  Your physician recommends that you continue on your current medications as directed. Please refer to the Current Medication list given to you today.  *If you need a refill on your cardiac medications before your next appointment, please call your pharmacy*   Lab Work: BMET, CBC Your physician has requested that you have an echocardiogram. Echocardiography is a painless test that uses sound waves to create images of your heart. It provides your doctor with information about the size and shape of your heart and how well your heart's chambers and valves are working. This procedure takes approximately one hour. There are no restrictions for this procedure.   If you have labs (blood work) drawn today and your tests are completely normal, you will receive your results only by: Marland Kitchen MyChart Message (if you have MyChart) OR . A paper copy in the mail If you have any lab test that is abnormal or we need to change your treatment, we will call you to review the results.   Testing/Procedures:  Your physician has requested that you have an echocardiogram. Echocardiography is a painless test that uses sound waves to create images of your heart. It provides your doctor with information about the size and  shape of your heart and how well your heart's chambers and valves are working. This procedure takes approximately one hour. There are no restrictions for this procedure.     Follow-Up: At [facility], you and your health needs are our priority.  As part of our continuing mission to provide you with exceptional heart care, we have created designated Provider Care Teams.  These Care Teams include your primary Cardiologist (physician) and Advanced Practice Providers (APPs -  Physician Assistants and Nurse Practitioners) who all work together to provide you with the care you need, when you need it.  We recommend signing up for the patient portal called  "MyChart".  Sign up information is provided on this After Visit Summary.  MyChart is used to connect with patients for Virtual Visits (Telemedicine).  Patients are able to view lab/test results, encounter notes, upcoming appointments, etc.  Non-urgent messages can be sent to your provider as well.   To learn more about what you can do with MyChart, go to [email or web address].    Your next appointment:   1 month(s)  The format for your next appointment:   In Person  Provider:   You may see Dorris Carnes, MD or one of the following Advanced Practice Providers on your designated Care Team:    Bernerd Pho, PA-C   Ermalinda Barrios, PA-C     Other Instructions None     Thank you for choosing [facility] !

## 2020-07-06 NOTE — Addendum Note (Signed)
Addended by: Levonne Hubert on: 07/06/2020 02:49 PM   Modules accepted: Orders

## 2020-07-07 ENCOUNTER — Other Ambulatory Visit (HOSPITAL_COMMUNITY)
Admission: RE | Admit: 2020-07-07 | Discharge: 2020-07-07 | Disposition: A | Discharge status: Home / Self Care | Payer: Medicare Other | Source: Ambulatory Visit | Attending: General Practice | Admitting: General Practice

## 2020-07-07 ENCOUNTER — Ambulatory Visit (HOSPITAL_COMMUNITY)
Admission: RE | Admit: 2020-07-07 | Discharge: 2020-07-07 | Disposition: A | Discharge status: Home / Self Care | Payer: Medicare Other | Source: Ambulatory Visit | Attending: General Practice | Admitting: General Practice

## 2020-07-07 ENCOUNTER — Telehealth: Payer: Self-pay | Admitting: General Practice

## 2020-07-07 DIAGNOSIS — I4819 Other persistent atrial fibrillation: Secondary | ICD-10-CM

## 2020-07-07 DIAGNOSIS — R5383 Other fatigue: Secondary | ICD-10-CM | POA: Insufficient documentation

## 2020-07-07 LAB — ECHOCARDIOGRAM COMPLETE
AR max vel: 2.06 cm2
AV Area VTI: 1.98 cm2
AV Area mean vel: 1.99 cm2
AV Mean grad: 11.3 mmHg
AV Peak grad: 20.4 mmHg
Ao pk vel: 2.26 m/s
Area-P 1/2: 3.72 cm2
S' Lateral: 2.2 cm

## 2020-07-07 LAB — BASIC METABOLIC PANEL
Anion gap: 11 (ref 5–15)
BUN: 18 mg/dL (ref 8–23)
CO2: 24 mmol/L (ref 22–32)
Calcium: 9.2 mg/dL (ref 8.9–10.3)
Chloride: 104 mmol/L (ref 98–111)
Creatinine, Ser: 1.18 mg/dL (ref 0.61–1.24)
GFR, Estimated: >60 mL/min (ref >60)
Glucose, Bld: 126 mg/dL — ABNORMAL HIGH (ref 70–99)
Potassium: 3.8 mmol/L (ref 3.5–5.1)
Sodium: 139 mmol/L (ref 135–145)

## 2020-07-07 LAB — CBC
HCT: 40.7 % (ref 39.0–52.0)
Hemoglobin: 13.1 g/dL (ref 13.0–17.0)
MCH: 29.7 pg (ref 26.0–34.0)
MCHC: 32.2 g/dL (ref 30.0–36.0)
MCV: 92.3 fL (ref 80.0–100.0)
Platelets: 229 10*3/uL (ref 150–400)
RBC: 4.41 MIL/uL (ref 4.22–5.81)
RDW: 13.3 % (ref 11.5–15.5)
WBC: 12.1 10*3/uL — ABNORMAL HIGH (ref 4.0–10.5)
nRBC: 0 % (ref 0.0–0.2)

## 2020-07-07 NOTE — Telephone Encounter (Signed)
Patient given echo results, pcp copied

## 2020-07-07 NOTE — Progress Notes (Signed)
*  PRELIMINARY RESULTS* Echocardiogram 2D Echocardiogram has been performed.  Brent Wilkins 07/07/2020, 12:15 PM

## 2020-07-07 NOTE — Telephone Encounter (Signed)
Patient is returning call to [facility] in regards to Echo.

## 2020-07-10 ENCOUNTER — Encounter: Payer: Self-pay | Admitting: Nurse Practitioner

## 2020-07-10 ENCOUNTER — Other Ambulatory Visit: Payer: Self-pay

## 2020-07-10 ENCOUNTER — Ambulatory Visit (INDEPENDENT_AMBULATORY_CARE_PROVIDER_SITE_OTHER): Payer: Medicare Other | Admitting: Nurse Practitioner

## 2020-07-10 VITALS — BP 167/93 | HR 96 | Temp 98.7°F | Resp 20 | Ht 70.0 in | Wt 200.0 lb

## 2020-07-10 DIAGNOSIS — J0111 Acute recurrent frontal sinusitis: Secondary | ICD-10-CM | POA: Diagnosis not present

## 2020-07-10 MED ORDER — LEVOFLOXACIN 500 MG PO TABS: 500.0000 mg | ORAL_TABLET | Freq: Every day | ORAL | 0 refills | Status: DC

## 2020-07-10 NOTE — Progress Notes (Signed)
Acute Office Visit  Subjective:    Patient ID: Brent Wilkins, male    DOB: [birth year], 79 y.o.   MRN: [id number]  Chief Complaint  Patient presents with  . Sinusitis    x 1 week   . Cough    x 1 week   . Wheezing    x 1 week   . Fatigue    x 1 week     HPI Patient is in today for cough, sinus pressures/pain, and postnasal drip.   Symptoms started 5 days ago.  He had elevated WBC with labs at cardiology.  He has been taking mucinex, but that hasn't helped.  He endorses fatigue, but denies SOB.  Past Medical History:  Diagnosis Date  . Allergy    hay fever;  seasonal  . Arthritis    right ankle  . Cataract    surgery 2016  . Chronic kidney disease    stones many years ago  . Constipation 12/09/2018  . Dizziness 12/13/2015  . Dysphagia   . Family history of malignant neoplasm of prostate 12/12/2017  . Frequent urination at night   . GERD (gastroesophageal reflux disease)   . History of arthroplasty of right ankle 02/04/2020  . History of kidney stones   . History of prostate cancer 06/16/2017  . HTN (hypertension)   . Hyperlipidemia LDL goal <100 10/26/2019  . Hypertension    Phreesia 04/24/2020  . Hypothyroidism   . 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 Neuropathy    Bilateral ankles  . Paroxysmal atrial fibrillation (HCC)    a. on Tikosyn and Eliquis  . Polyneuropathy 06/16/2017  . 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 Prostate cancer [facility]) 2010   Dr. Rosana Hoes  . Schatzki's ring 10/22/2011  . Seasonal allergies   . Syncope  dx. 8 yrs ago  . Syncope, cardiogenic 03/20/2017  . Vaccine counseling 09/15/2019    Past Surgical History:  Procedure Laterality Date  . APPENDECTOMY    . ATRIAL FIBRILLATION ABLATION N/A 10/06/2019   Procedure: ATRIAL FIBRILLATION ABLATION;  Surgeon: Constance Haw, MD;  Location: [facility];  Service: Cardiovascular;  Laterality: N/A;  . CATARACT EXTRACTION W/PHACO Left 03/27/2015   Procedure: CATARACT EXTRACTION PHACO  AND INTRAOCULAR LENS PLACEMENT LEFT EYE CDE=11.73;  Surgeon: Tonny Branch, MD;  Location: [facility];  Service: Ophthalmology;  Laterality: Left;  . CATARACT EXTRACTION W/PHACO Right 03/30/2015   Procedure: CATARACT EXTRACTION PHACO AND INTRAOCULAR LENS PLACEMENT RIGHT EYE CDE=7.35;  Surgeon: Tonny Branch, MD;  Location: [facility];  Service: Ophthalmology;  Laterality: Right;  . CHOLECYSTECTOMY    . COLONOSCOPY  07/23/10   Dr. Vivi Ferns rectum, scattered pancolonic diverticula  . COLONOSCOPY  08/10/1999   internal hemorrhoids,inflammatory polyp  . ESOPHAGOGASTRODUODENOSCOPY  04/12/08   Prominent Schatzki's ring, erosive reflux esophagitis, multiple antral erosions, small hiatal hernia, reactive gastropathy, status post dilation with 6F  . ESOPHAGOGASTRODUODENOSCOPY  10/28/2011   Procedure: ESOPHAGOGASTRODUODENOSCOPY (EGD);  Surgeon: Daneil Dolin, MD;  Location[facility];  Service: Endoscopy;  Laterality: N/A;  1:30  . ESOPHAGOGASTRODUODENOSCOPY N/A 09/10/2017   Dr. Gala Romney: Schatkzi's ring s/p dilation at GE junction, mild esophageal reflux esophagitis, medium sized hiatal hernia, normal duodenum. 56 and 56 Fr dilation  . EYE SURGERY    . JOINT REPLACEMENT N/A    Phreesia 01/15/2020  . MALONEY DILATION  10/28/2011   Procedure: Venia Minks DILATION;  Surgeon: Daneil Dolin, MD;  [facility]: [facility];  Service: Endoscopy;  Laterality: N/A;  . Venia Minks DILATION N/A 09/10/2017   Procedure: Venia Minks DILATION;  Surgeon: Daneil Dolin, MD;  [facility];  Service: Endoscopy;  Laterality: N/A;  . SAVORY DILATION  10/28/2011   Procedure: SAVORY DILATION;  Surgeon: Daneil Dolin, MD;  [facility];  Service: Endoscopy;  Laterality: N/A;  . TONSILLECTOMY    . TOTAL ANKLE ARTHROPLASTY Left 11/25/2013   Procedure: TOTAL ANKLE ARTHOPLASTY;  Surgeon: Wylene Simmer, MD;  Location: [facility];  Service: Orthopedics;  Laterality: Left;  . TOTAL ANKLE ARTHROPLASTY Right 07/14/2014   dr hewitt  . TOTAL  ANKLE ARTHROPLASTY Right 07/14/2014   Procedure: RIGHT TOTAL ANKLE ARTHOPLASTY;  Surgeon: Wylene Simmer, MD;  Location: [facility];  Service: Orthopedics;  Laterality: Right;    Family History  Problem Relation Age of Onset  . Prostate cancer Father   . Cancer Father        prostate to bone  . Hypertension Father   . Prostate cancer Son 51  . ALS Son   . Prostate cancer Brother   . Heart disease Mother        died at 70  . Arthritis Mother   . Colon cancer Neg Hx     Social History   Socioeconomic History  . Marital status: Married    Spouse name: Candace  . Number of children: 2  . Years of education: Not on file  . Highest education level: Bachelor's degree (e.g., BA, AB, BS)  Occupational History  . Occupation: retired; Ambulance person: RETIRED  Tobacco Use  . Smoking status: Never Smoker  . Smokeless tobacco: Never Used  Vaping Use  . Vaping Use: Never used  Substance and Sexual Activity  . Alcohol use: Yes    Alcohol/week: 2.0 standard drinks    Types: 1 Glasses of wine, 1 Cans of beer per week    Comment: glass of wine or "couple of beers" occasionally   . Drug use: No  . Sexual activity: Yes    Partners: Female  Other Topics Concern  . Not on file  Social History Narrative   Lives with wife Candace.       Moved from [place], [place]   Retired from [employer].   Plays golf.   Works with [employer] and [employer] for [employer].   Wears seatbelt.   Drives.   Eats all food groups.   Never smoked.   Married for over 13 years June  (2020)   Has two grown children, live in [place] and [place].   Social Determinants of Health   Financial Resource Strain: Low Risk   . Difficulty of Paying Living Expenses: Not hard at all  Food Insecurity: No Food Insecurity  . Worried About Charity fundraiser in the Last Year: Never true  . Ran Out of Food in the Last Year: Never true  Transportation Needs: No Transportation Needs  . Lack of Transportation (Medical):  No  . Lack of Transportation (Non-Medical): No  Physical Activity: Sufficiently Active  . Days of Exercise per Week: 7 days  . Minutes of Exercise per Session: 60 min  Stress: No Stress Concern Present  . Feeling of Stress : Not at all  Social Connections: Socially Integrated  . Frequency of Communication with Friends and Family: More than three times a week  . Frequency of Social Gatherings with Friends and Family: More than three times a week  . Attends  Religious Services: More than 4 times per year  . Active Member of Clubs or Organizations: Yes  . Attends Archivist Meetings: More than 4 times per year  . Marital Status: Married  Human resources officer Violence:   . Fear of Current or Ex-Partner: Not on file  . Emotionally Abused: Not on file  . Physically Abused: Not on file  . Sexually Abused: Not on file    Outpatient Medications Prior to Visit  Medication Sig Dispense Refill  . amLODipine (NORVASC) 5 MG tablet TAKE 1 TABLET BY MOUTH  DAILY 90 tablet 3  . diphenhydramine-acetaminophen (TYLENOL PM) 25-500 MG TABS tablet Take 1 tablet by mouth at bedtime as needed (sleep and mild discomfort).     . dofetilide (TIKOSYN) 500 MCG capsule Take 1 capsule (500 mcg total) by mouth 2 (two) times daily. 180 capsule 2  . ELIQUIS 5 MG TABS tablet TAKE 1 TABLET BY MOUTH  TWICE DAILY 180 tablet 1  . finasteride (PROSCAR) 5 MG tablet Take 5 mg by mouth daily.    . finasteride (PROSCAR) 5 MG tablet Take 1 tablet by mouth daily.    . fluticasone (FLONASE) 50 MCG/ACT nasal spray Place 1 spray into both nostrils daily as needed for allergies. 48 g 3  . gabapentin (NEURONTIN) 300 MG capsule Take 300 mg by mouth 2 (two) times daily.     Marland Kitchen ibuprofen (ADVIL,MOTRIN) 200 MG tablet Take 400 mg by mouth as needed for headache (headache/pain.).     Marland Kitchen levothyroxine (SYNTHROID) 75 MCG tablet Take 1 tablet (75 mcg total) by mouth daily. 90 tablet 1  . loratadine (CLARITIN) 10 MG tablet Take 10 mg by  mouth daily as needed for allergies.    Marland Kitchen losartan (COZAAR) 50 MG tablet TAKE 1 TABLET BY MOUTH  DAILY 90 tablet 3  . Omega-3 Fatty Acids (RA FISH OIL) 1400 MG CPDR Take 1,400 mg by mouth daily.    . pantoprazole (PROTONIX) 40 MG tablet TAKE 1 TABLET BY MOUTH  DAILY BEFORE BREAKFAST 90 tablet 3  . Polyethyl Glycol-Propyl Glycol (SYSTANE) 0.4-0.3 % SOLN Place 1 drop into both eyes 2 (two) times daily as needed (dry eyes).     . tamsulosin (FLOMAX) 0.4 MG CAPS capsule Take 1 capsule by mouth daily.    . Tamsulosin HCl (FLOMAX) 0.4 MG CAPS Take 0.4 mg by mouth daily after breakfast.     . vitamin B-12 (CYANOCOBALAMIN) 1000 MCG tablet Take 1,000 mcg by mouth daily.     No facility-administered medications prior to visit.    Allergies  Allergen Reactions  . Tetanus Toxoids Swelling    Face swelling  . Statins Other (See Comments)    Hard to walk, severe leg cramps Other reaction(s): Other (See Comments) Muscle weakness "unable to walk"  . Dicyclomine Other (See Comments)    constipation  . Sulfamethoxazole     Other reaction(s): Other (See Comments) unknown  . Doxycycline Rash    Rash only  . Oxycodone Other (See Comments)    Severe constipation  . Penicillins Swelling and Rash    Took as a child, developed rash over time Has patient had a PCN reaction causing immediate rash, facial/tongue/throat swelling, SOB or lightheadedness with hypotension: Yes Has patient had a PCN reaction causing severe rash involving mucus membranes or skin necrosis: Unknown Has patient had a PCN reaction that required hospitalization: No Has patient had a PCN reaction occurring within the last 10 years: No If all of the above answers  are "NO", then may proceed with Cephalosporin use.  Other reaction(s): Other (See Comments) unknown  . Sulfa Antibiotics Swelling and Rash  . Xarelto [Rivaroxaban] Rash    rash    Review of Systems  Constitutional: Positive for fatigue. Negative for fever.  HENT:  Positive for congestion, sinus pressure and sinus pain. Negative for sore throat.   Respiratory: Positive for cough. Negative for chest tightness, shortness of breath, wheezing and stridor.   Cardiovascular: Negative.        Objective:    Physical Exam Constitutional:      Appearance: Normal appearance.  HENT:     Right Ear: Tympanic membrane, ear canal and external ear normal.     Left Ear: Tympanic membrane, ear canal and external ear normal.     Nose: No congestion or rhinorrhea.     Comments: Mild sinus pain on palpation    Mouth/Throat:     Mouth: Mucous membranes are moist.     Pharynx: Oropharynx is clear.  Cardiovascular:     Rate and Rhythm: Normal rate and regular rhythm.     Heart sounds: Normal heart sounds.  Pulmonary:     Effort: Pulmonary effort is normal.     Breath sounds: Normal breath sounds.  Neurological:     Mental Status: He is alert.     There were no vitals taken for this visit. Wt Readings from Last 3 Encounters:  07/06/20 202 lb (91.6 kg)  04/27/20 201 lb 12.8 oz (91.5 kg)  04/18/20 204 lb (92.5 kg)    There are no preventive care reminders to display for this patient.  There are no preventive care reminders to display for this patient.   Lab Results  Component Value Date   TSH 3.940 04/28/2020   Lab Results  Component Value Date   WBC 12.1 (H) 07/07/2020   HGB 13.1 07/07/2020   HCT 40.7 07/07/2020   MCV 92.3 07/07/2020   PLT 229 07/07/2020   Lab Results  Component Value Date   NA 139 07/07/2020   K 3.8 07/07/2020   CO2 24 07/07/2020   GLUCOSE 126 (H) 07/07/2020   BUN 18 07/07/2020   CREATININE 1.18 07/07/2020   BILITOT 0.4 05/19/2020   ALKPHOS 79 05/19/2020   AST 18 05/19/2020   ALT 8 05/19/2020   PROT 6.4 05/19/2020   ALBUMIN 4.0 05/19/2020   CALCIUM 9.2 07/07/2020   ANIONGAP 11 07/07/2020   Lab Results  Component Value Date   CHOL 200 (H) 04/28/2020   Lab Results  Component Value Date   HDL 43 04/28/2020    Lab Results  Component Value Date   LDLCALC 121 (H) 04/28/2020   Lab Results  Component Value Date   TRIG 203 (H) 04/28/2020   Lab Results  Component Value Date   CHOLHDL 4.7 04/28/2020   Lab Results  Component Value Date   HGBA1C 5.5 04/28/2020       Assessment & Plan:   Problem List Items Addressed This Visit      Respiratory   Acute recurrent frontal sinusitis - Primary    -he is allergic to doxycycline, PCNs, and sulfa, so his antibiotic choices are limited -he tried z-pack last year with no effect -Rx. levaquin -f/u in 1 week if no improvement      Relevant Medications   levofloxacin (LEVAQUIN) 500 MG tablet       Meds ordered this encounter  Medications  . levofloxacin (LEVAQUIN) 500 MG tablet    Sig:  Take 1 tablet (500 mg total) by mouth daily.    Dispense:  7 tablet    Refill:  0     Noreene Larsson, NP

## 2020-07-10 NOTE — Patient Instructions (Addendum)
I hope you feel better soon.    I called in a prescription for levaquin to the [facility] on [address]. If you aren't feeling better in a week, give [place] a call.

## 2020-07-10 NOTE — Assessment & Plan Note (Signed)
-  he is allergic to doxycycline, PCNs, and sulfa, so his antibiotic choices are limited -he tried z-pack last year with no effect -Rx. levaquin -f/u in 1 week if no improvement

## 2020-07-17 ENCOUNTER — Telehealth: Payer: Self-pay

## 2020-07-17 ENCOUNTER — Other Ambulatory Visit: Payer: Self-pay | Admitting: Nurse Practitioner

## 2020-07-17 DIAGNOSIS — J0111 Acute recurrent frontal sinusitis: Secondary | ICD-10-CM

## 2020-07-17 DIAGNOSIS — R0981 Nasal congestion: Secondary | ICD-10-CM

## 2020-07-17 MED ORDER — CHLORPHENIRAMINE MALEATE 4 MG PO TABS: 4.0000 mg | ORAL_TABLET | Freq: Four times a day (QID) | ORAL | Status: DC | PRN

## 2020-07-17 NOTE — Telephone Encounter (Signed)
Pt called and states he was recently seen and prescribed an abt for his sinuses. Pt states he is a little better but not much and still having trouble with congestion. Pt wants to know if he needs another abt or what you advise?

## 2020-07-17 NOTE — Telephone Encounter (Signed)
He took levaquin, which is a big gun for a sinus infection. It kills a lot of bacteria, but it can have side effects like tendon rupture. I wouldn't prescribe another round of that unless he really needs it.  He is allergic to just about all of the other antibiotics.  What decongestants has he tried? Like pseudofed, phenylephrine, etc.?

## 2020-07-17 NOTE — Telephone Encounter (Signed)
Pt informed

## 2020-07-18 ENCOUNTER — Other Ambulatory Visit: Payer: Self-pay | Admitting: Nurse Practitioner

## 2020-07-18 MED ORDER — CHLORPHENIRAMINE MALEATE 4 MG PO TABS: 4.0000 mg | ORAL_TABLET | Freq: Two times a day (BID) | ORAL | 0 refills | Status: DC | PRN

## 2020-07-25 ENCOUNTER — Telehealth (INDEPENDENT_AMBULATORY_CARE_PROVIDER_SITE_OTHER): Payer: Medicare Other | Admitting: Nurse Practitioner

## 2020-07-25 ENCOUNTER — Other Ambulatory Visit: Payer: Self-pay

## 2020-07-25 ENCOUNTER — Telehealth: Payer: Self-pay

## 2020-07-25 ENCOUNTER — Other Ambulatory Visit: Payer: Medicare Other

## 2020-07-25 ENCOUNTER — Encounter: Payer: Self-pay | Admitting: Nurse Practitioner

## 2020-07-25 DIAGNOSIS — Z20822 Contact with and (suspected) exposure to covid-19: Secondary | ICD-10-CM | POA: Diagnosis not present

## 2020-07-25 DIAGNOSIS — J0111 Acute recurrent frontal sinusitis: Secondary | ICD-10-CM

## 2020-07-25 MED ORDER — NOREL AD 4-10-325 MG PO TABS: 1.0000 | ORAL_TABLET | ORAL | 1 refills | Status: DC | PRN

## 2020-07-25 NOTE — Assessment & Plan Note (Addendum)
-  he is allergic to doxycycline, PCNs, and sulfa, so his antibiotic choices are limited -was prescribed levaquin previously -STOP solo chlorpheniramine -Rx. Norel; we discussed that his BP may go higher than usual while on this, but other decongestants have failed -f/u in 1 week if no improvement -he states he has cough and fatigue, will get COVID test -if no improvement, will consider trying z-pack

## 2020-07-25 NOTE — Telephone Encounter (Signed)
Pt made an appt via virtual with [facility] 07-25-20

## 2020-07-25 NOTE — Telephone Encounter (Signed)
Pt is still having a bad cough, in the am , and PM keeping him up at night . Over the counter is not working, can something be called in to help with the cough and sleep

## 2020-07-25 NOTE — Progress Notes (Signed)
Acute Office Visit  Subjective:    Patient ID: Brent Wilkins, male    DOB: [birth year], 79 y.o.   MRN: [id number]  Chief Complaint  Patient presents with  . Acute Visit    Pt is still sick after visit on 07-10-20 he is still coughing a lot and this is keeping him up     HPI Patient is in today for cough and postnasal drip.  He has very little energy. He state he has had poor appetite, and lost about 7 pounds this week.  He was treated with levaquin previously, and he states that helped some.  It did not clear completely, so he was started on chlorpheniramine at his last OV.  Past Medical History:  Diagnosis Date  . Allergy    hay fever;  seasonal  . Arthritis    right ankle  . Cataract    surgery 2016  . Chronic kidney disease    stones many years ago  . Constipation 12/09/2018  . Dizziness 12/13/2015  . Dysphagia   . Family history of malignant neoplasm of prostate 12/12/2017  . Frequent urination at night   . GERD (gastroesophageal reflux disease)   . History of arthroplasty of right ankle 02/04/2020  . History of kidney stones   . History of prostate cancer 06/16/2017  . HTN (hypertension)   . Hyperlipidemia LDL goal <100 10/26/2019  . Hypertension    Phreesia 04/24/2020  . Hypothyroidism   . 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 Neuropathy    Bilateral ankles  . Paroxysmal atrial fibrillation (HCC)    a. on Tikosyn and Eliquis  . Polyneuropathy 06/16/2017  . 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 Prostate cancer [facility]) 2010   Dr. Rosana Hoes  . Schatzki's ring 10/22/2011  . Seasonal allergies   . Syncope  dx. 8 yrs ago  . Syncope, cardiogenic 03/20/2017  . Vaccine counseling 09/15/2019    Past Surgical History:  Procedure Laterality Date  . APPENDECTOMY    . ATRIAL FIBRILLATION ABLATION N/A 10/06/2019   Procedure: ATRIAL FIBRILLATION ABLATION;  Surgeon: Constance Haw, MD;  Location: [place] [facility];  Service: Cardiovascular;  Laterality: N/A;  .  CATARACT EXTRACTION W/PHACO Left 03/27/2015   Procedure: CATARACT EXTRACTION PHACO AND INTRAOCULAR LENS PLACEMENT LEFT EYE CDE=11.73;  Surgeon: Tonny Branch, MD;  Location: [facility];  Service: Ophthalmology;  Laterality: Left;  . CATARACT EXTRACTION W/PHACO Right 03/30/2015   Procedure: CATARACT EXTRACTION PHACO AND INTRAOCULAR LENS PLACEMENT RIGHT EYE CDE=7.35;  Surgeon: Tonny Branch, MD;  Location: [facility];  Service: Ophthalmology;  Laterality: Right;  . CHOLECYSTECTOMY    . COLONOSCOPY  07/23/10   Dr. Vivi Ferns rectum, scattered pancolonic diverticula  . COLONOSCOPY  08/10/1999   internal hemorrhoids,inflammatory polyp  . ESOPHAGOGASTRODUODENOSCOPY  04/12/08   Prominent Schatzki's ring, erosive reflux esophagitis, multiple antral erosions, small hiatal hernia, reactive gastropathy, status post dilation with 40F  . ESOPHAGOGASTRODUODENOSCOPY  10/28/2011   Procedure: ESOPHAGOGASTRODUODENOSCOPY (EGD);  Surgeon: Daneil Dolin, MD;  Location[facility];  Service: Endoscopy;  Laterality: N/A;  1:30  . ESOPHAGOGASTRODUODENOSCOPY N/A 09/10/2017   Dr. Gala Romney: Schatkzi's ring s/p dilation at GE junction, mild esophageal reflux esophagitis, medium sized hiatal hernia, normal duodenum. 56 and 55 Fr dilation  . EYE SURGERY    . JOINT REPLACEMENT N/A    Phreesia 01/15/2020  . MALONEY DILATION  10/28/2011   Procedure: Venia Minks DILATION;  Surgeon: Daneil Dolin, MD;  [facility];  Service: Endoscopy;  Laterality: N/A;  . Venia Minks DILATION N/A 09/10/2017   Procedure: Venia Minks DILATION;  Surgeon: Daneil Dolin, MD;  [facility];  Service: Endoscopy;  Laterality: N/A;  . SAVORY DILATION  10/28/2011   Procedure: SAVORY DILATION;  Surgeon: Daneil Dolin, MD;  [facility];  Service: Endoscopy;  Laterality: N/A;  . TONSILLECTOMY    . TOTAL ANKLE ARTHROPLASTY Left 11/25/2013   Procedure: TOTAL ANKLE ARTHOPLASTY;  Surgeon: Wylene Simmer, MD;  Location: [place];  Service: Orthopedics;   Laterality: Left;  . TOTAL ANKLE ARTHROPLASTY Right 07/14/2014   dr hewitt  . TOTAL ANKLE ARTHROPLASTY Right 07/14/2014   Procedure: RIGHT TOTAL ANKLE ARTHOPLASTY;  Surgeon: Wylene Simmer, MD;  Location: [place];  Service: Orthopedics;  Laterality: Right;    Family History  Problem Relation Age of Onset  . Prostate cancer Father   . Cancer Father        prostate to bone  . Hypertension Father   . Prostate cancer Son 45  . ALS Son   . Prostate cancer Brother   . Heart disease Mother        died at 16  . Arthritis Mother   . Colon cancer Neg Hx     Social History   Socioeconomic History  . Marital status: Married    Spouse name: Candace  . Number of children: 2  . Years of education: Not on file  . Highest education level: Bachelor's degree (e.g., BA, AB, BS)  Occupational History  . Occupation: retired; Ambulance person: RETIRED  Tobacco Use  . Smoking status: Never Smoker  . Smokeless tobacco: Never Used  Vaping Use  . Vaping Use: Never used  Substance and Sexual Activity  . Alcohol use: Yes    Alcohol/week: 2.0 standard drinks    Types: 1 Glasses of wine, 1 Cans of beer per week    Comment: glass of wine or "couple of beers" occasionally   . Drug use: No  . Sexual activity: Yes    Partners: Female  Other Topics Concern  . Not on file  Social History Narrative   Lives with wife Candace.       Moved from [place], [place]   Retired from [employer].   Plays golf.   Works with [employer] and [employer] for [employer].   Wears seatbelt.   Drives.   Eats all food groups.   Never smoked.   Married for over 40 years June  (2020)   Has two grown children, live in [place] and [place].   Social Determinants of Health   Financial Resource Strain: Low Risk   . Difficulty of Paying Living Expenses: Not hard at all  Food Insecurity: No Food Insecurity  . Worried About Charity fundraiser in the Last Year: Never true  . Ran Out of Food in the Last Year: Never  true  Transportation Needs: No Transportation Needs  . Lack of Transportation (Medical): No  . Lack of Transportation (Non-Medical): No  Physical Activity: Sufficiently Active  . Days of Exercise per Week: 7 days  . Minutes of Exercise per Session: 60 min  Stress: No Stress Concern Present  . Feeling of Stress : Not at all  Social Connections: Socially Integrated  . Frequency of Communication with Friends and Family: More than three times a week  . Frequency of Social Gatherings with Friends and Family: More than three times a week  .  Attends Religious Services: More than 4 times per year  . Active Member of Clubs or Organizations: Yes  . Attends Archivist Meetings: More than 4 times per year  . Marital Status: Married  Human resources officer Violence: Not on file    Outpatient Medications Prior to Visit  Medication Sig Dispense Refill  . amLODipine (NORVASC) 5 MG tablet TAKE 1 TABLET BY MOUTH  DAILY 90 tablet 3  . diphenhydramine-acetaminophen (TYLENOL PM) 25-500 MG TABS tablet Take 1 tablet by mouth at bedtime as needed (sleep and mild discomfort).     . dofetilide (TIKOSYN) 500 MCG capsule Take 1 capsule (500 mcg total) by mouth 2 (two) times daily. 180 capsule 2  . ELIQUIS 5 MG TABS tablet TAKE 1 TABLET BY MOUTH  TWICE DAILY 180 tablet 1  . finasteride (PROSCAR) 5 MG tablet Take 5 mg by mouth daily.    . finasteride (PROSCAR) 5 MG tablet Take 1 tablet by mouth daily.    . fluticasone (FLONASE) 50 MCG/ACT nasal spray Place 1 spray into both nostrils daily as needed for allergies. 48 g 3  . gabapentin (NEURONTIN) 300 MG capsule Take 300 mg by mouth 2 (two) times daily.     Marland Kitchen ibuprofen (ADVIL,MOTRIN) 200 MG tablet Take 400 mg by mouth as needed for headache (headache/pain.).     Marland Kitchen levofloxacin (LEVAQUIN) 500 MG tablet Take 1 tablet (500 mg total) by mouth daily. 7 tablet 0  . levothyroxine (SYNTHROID) 75 MCG tablet Take 1 tablet (75 mcg total) by mouth daily. 90 tablet 1  .  loratadine (CLARITIN) 10 MG tablet Take 10 mg by mouth daily as needed for allergies.    Marland Kitchen losartan (COZAAR) 50 MG tablet TAKE 1 TABLET BY MOUTH  DAILY 90 tablet 3  . Omega-3 Fatty Acids (RA FISH OIL) 1400 MG CPDR Take 1,400 mg by mouth daily.    . pantoprazole (PROTONIX) 40 MG tablet TAKE 1 TABLET BY MOUTH  DAILY BEFORE BREAKFAST 90 tablet 3  . Polyethyl Glycol-Propyl Glycol 0.4-0.3 % SOLN Place 1 drop into both eyes 2 (two) times daily as needed (dry eyes).     . tamsulosin (FLOMAX) 0.4 MG CAPS capsule Take 1 capsule by mouth daily.    . Tamsulosin HCl (FLOMAX) 0.4 MG CAPS Take 0.4 mg by mouth daily after breakfast.     . vitamin B-12 (CYANOCOBALAMIN) 1000 MCG tablet Take 1,000 mcg by mouth daily.    . chlorpheniramine (CHLOR-TRIMETON) 4 MG tablet Take 1 tablet (4 mg total) by mouth 2 (two) times daily as needed for allergies. 14 tablet 0   No facility-administered medications prior to visit.    Allergies  Allergen Reactions  . Tetanus Toxoids Swelling    Face swelling  . Statins Other (See Comments)    Hard to walk, severe leg cramps Other reaction(s): Other (See Comments) Muscle weakness "unable to walk"  . Dicyclomine Other (See Comments)    constipation  . Sulfamethoxazole     Other reaction(s): Other (See Comments) unknown  . Doxycycline Rash    Rash only  . Oxycodone Other (See Comments)    Severe constipation  . Penicillins Swelling and Rash    Took as a child, developed rash over time Has patient had a PCN reaction causing immediate rash, facial/tongue/throat swelling, SOB or lightheadedness with hypotension: Yes Has patient had a PCN reaction causing severe rash involving mucus membranes or skin necrosis: Unknown Has patient had a PCN reaction that required hospitalization: No Has patient had  a PCN reaction occurring within the last 10 years: No If all of the above answers are "NO", then may proceed with Cephalosporin use.  Other reaction(s): Other (See  Comments) unknown  . Sulfa Antibiotics Swelling and Rash  . Xarelto [Rivaroxaban] Rash    rash    Review of Systems  Constitutional: Positive for appetite change and fatigue. Negative for chills and fever.  Respiratory: Positive for cough. Negative for chest tightness, shortness of breath and wheezing.   Cardiovascular: Negative.        Objective:    Physical Exam  There were no vitals taken for this visit. Wt Readings from Last 3 Encounters:  07/10/20 200 lb (90.7 kg)  07/06/20 202 lb (91.6 kg)  04/27/20 201 lb 12.8 oz (91.5 kg)    Health Maintenance Due  Topic Date Due  . COVID-19 Vaccine (3 - Moderna risk 4-dose series) 10/15/2019    There are no preventive care reminders to display for this patient.   Lab Results  Component Value Date   TSH 3.940 04/28/2020   Lab Results  Component Value Date   WBC 12.1 (H) 07/07/2020   HGB 13.1 07/07/2020   HCT 40.7 07/07/2020   MCV 92.3 07/07/2020   PLT 229 07/07/2020   Lab Results  Component Value Date   NA 139 07/07/2020   K 3.8 07/07/2020   CO2 24 07/07/2020   GLUCOSE 126 (H) 07/07/2020   BUN 18 07/07/2020   CREATININE 1.18 07/07/2020   BILITOT 0.4 05/19/2020   ALKPHOS 79 05/19/2020   AST 18 05/19/2020   ALT 8 05/19/2020   PROT 6.4 05/19/2020   ALBUMIN 4.0 05/19/2020   CALCIUM 9.2 07/07/2020   ANIONGAP 11 07/07/2020   Lab Results  Component Value Date   CHOL 200 (H) 04/28/2020   Lab Results  Component Value Date   HDL 43 04/28/2020   Lab Results  Component Value Date   LDLCALC 121 (H) 04/28/2020   Lab Results  Component Value Date   TRIG 203 (H) 04/28/2020   Lab Results  Component Value Date   CHOLHDL 4.7 04/28/2020   Lab Results  Component Value Date   HGBA1C 5.5 04/28/2020       Assessment & Plan:   Problem List Items Addressed This Visit      Respiratory   Acute recurrent frontal sinusitis    -he is allergic to doxycycline, PCNs, and sulfa, so his antibiotic choices are  limited -was prescribed levaquin previously -STOP solo chlorpheniramine -Rx. [facility]; we discussed that his BP may go higher than usual while on this, but other decongestants have failed -f/u in 1 week if no improvement -he states he has cough and fatigue, will get COVID test -if no improvement, will consider trying z-pack       Relevant Medications   Chlorphen-PE-Acetaminophen (NOREL AD) 4-10-325 MG TABS       Meds ordered this encounter  Medications  . Chlorphen-PE-Acetaminophen (NOREL AD) 4-10-325 MG TABS    Sig: Take 1 tablet by mouth every 4 (four) hours as needed (nasal congestion, cold symptoms).    Dispense:  20 tablet    Refill:  1   Date:  07/25/2020   Location of Patient: Home Location of Provider: Office Consent was obtain for visit to be over via telehealth. I verified that I am speaking with the correct person using two identifiers.  I connected with  Brent Wilkins on 07/25/20 via telephone and verified that I am speaking with the  correct person using two identifiers.   I discussed the limitations of evaluation and management by telemedicine. The patient expressed understanding and agreed to proceed.  Time spent: 15 minutes   Noreene Larsson, NP

## 2020-07-27 ENCOUNTER — Encounter: Payer: Self-pay | Admitting: Family Medicine

## 2020-07-28 LAB — NOVEL CORONAVIRUS, NAA: SARS-CoV-2, NAA: NOT DETECTED

## 2020-07-28 LAB — SPECIMEN STATUS REPORT

## 2020-08-02 ENCOUNTER — Telehealth: Payer: Self-pay

## 2020-08-02 NOTE — Telephone Encounter (Signed)
Pt thinks that his thyroid levels are off, he has no energy, feels completely washed out, and when he does something, he feels the need to go sit down

## 2020-08-02 NOTE — Telephone Encounter (Signed)
Yes he does.

## 2020-08-02 NOTE — Telephone Encounter (Signed)
Pt has had a couple of telephone visits this month and covid test, he was negative. Does he need to follow up in office at this point for labs and eval?

## 2020-08-02 NOTE — Telephone Encounter (Signed)
Scheduled

## 2020-08-03 ENCOUNTER — Other Ambulatory Visit: Payer: Self-pay

## 2020-08-03 ENCOUNTER — Encounter: Payer: Self-pay | Admitting: Nurse Practitioner

## 2020-08-03 ENCOUNTER — Ambulatory Visit (INDEPENDENT_AMBULATORY_CARE_PROVIDER_SITE_OTHER): Payer: Medicare Other | Admitting: Nurse Practitioner

## 2020-08-03 DIAGNOSIS — R5383 Other fatigue: Secondary | ICD-10-CM

## 2020-08-03 DIAGNOSIS — J3489 Other specified disorders of nose and nasal sinuses: Secondary | ICD-10-CM | POA: Insufficient documentation

## 2020-08-03 MED ORDER — MONTELUKAST SODIUM 10 MG PO TABS: 10.0000 mg | ORAL_TABLET | Freq: Every day | ORAL | 3 refills | Status: DC

## 2020-08-03 NOTE — Assessment & Plan Note (Signed)
-  started after sinus infection -he states he had 2 ticks attached to him over the summer -will check for lyme as well as thyroid panel and CBC with CMP

## 2020-08-03 NOTE — Progress Notes (Addendum)
Acute Office Visit  Subjective:    Patient ID: Brent Wilkins, male    DOB: 24-Mar-1941, 79 y.o.   MRN: [id number]  Chief Complaint  Patient presents with  . Hypertension    Follow up visit. Wants bloodwork. C/o not having any energy for a few weeks now since having a sinus infection  . Sinus Problem    Still having some colored sinus drainage    HPI Patient is in today for fatigue.  He was seen initially on 07/10/20 for sinusitis and was treated with levaquin d/t extensive allergies.  He didn't improve so he was started on chlorpheniramine on 07/17/20, and he didn't improve and he was started on norel on 07/25/20.  Today, he states he feels fatigue and is still having some sinus drainage.  He states he is still using norel and it has helped some. He states he gets lightheaded at time. He states he had thyroid issues in the past, and states he feels like this.  He has an extensive list of allergies and states that he has recurrent allergies. Past Medical History:  Diagnosis Date  . Allergy    hay fever;  seasonal  . Arthritis    right ankle  . Cataract    surgery 2016  . Chronic kidney disease    stones many years ago  . Constipation 12/09/2018  . Dizziness 12/13/2015  . Dysphagia   . Family history of malignant neoplasm of prostate 12/12/2017  . Frequent urination at night   . GERD (gastroesophageal reflux disease)   . History of arthroplasty of right ankle 02/04/2020  . History of kidney stones   . History of prostate cancer 06/16/2017  . HTN (hypertension)   . Hyperlipidemia LDL goal <100 10/26/2019  . Hypertension    Phreesia 04/24/2020  . Hypothyroidism   . 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 Neuropathy    Bilateral ankles  . Paroxysmal atrial fibrillation (HCC)    a. on Tikosyn and Eliquis  . Polyneuropathy 06/16/2017  . 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 Prostate cancer [facility]) 2010   Dr. Rosana Hoes  . Schatzki's ring 10/22/2011  . Seasonal allergies   .  Syncope  dx. 8 yrs ago  . Syncope, cardiogenic 03/20/2017  . Vaccine counseling 09/15/2019    Past Surgical History:  Procedure Laterality Date  . APPENDECTOMY    . ATRIAL FIBRILLATION ABLATION N/A 10/06/2019   Procedure: ATRIAL FIBRILLATION ABLATION;  Surgeon: Constance Haw, MD;  Location: [place] [facility];  Service: Cardiovascular;  Laterality: N/A;  . CATARACT EXTRACTION W/PHACO Left 03/27/2015   Procedure: CATARACT EXTRACTION PHACO AND INTRAOCULAR LENS PLACEMENT LEFT EYE CDE=11.73;  Surgeon: Tonny Branch, MD;  Location: [facility];  Service: Ophthalmology;  Laterality: Left;  . CATARACT EXTRACTION W/PHACO Right 03/30/2015   Procedure: CATARACT EXTRACTION PHACO AND INTRAOCULAR LENS PLACEMENT RIGHT EYE CDE=7.35;  Surgeon: Tonny Branch, MD;  Location: [facility];  Service: Ophthalmology;  Laterality: Right;  . CHOLECYSTECTOMY    . COLONOSCOPY  07/23/10   Dr. Vivi Ferns rectum, scattered pancolonic diverticula  . COLONOSCOPY  08/10/1999   internal hemorrhoids,inflammatory polyp  . ESOPHAGOGASTRODUODENOSCOPY  04/12/08   Prominent Schatzki's ring, erosive reflux esophagitis, multiple antral erosions, small hiatal hernia, reactive gastropathy, status post dilation with 45F  . ESOPHAGOGASTRODUODENOSCOPY  10/28/2011   Procedure: ESOPHAGOGASTRODUODENOSCOPY (EGD);  Surgeon: Daneil Dolin, MD;  Location: [facility];  Service: Endoscopy;  Laterality: N/A;  1:30  . ESOPHAGOGASTRODUODENOSCOPY N/A 09/10/2017  Dr. Gala Romney: Schatkzi's ring s/p dilation at GE junction, mild esophageal reflux esophagitis, medium sized hiatal hernia, normal duodenum. 56 and 35 Fr dilation  . EYE SURGERY    . JOINT REPLACEMENT N/A    Phreesia 01/15/2020  . MALONEY DILATION  10/28/2011   Procedure: Venia Minks DILATION;  Surgeon: Daneil Dolin, MD;  Location: [facility];  Service: Endoscopy;  Laterality: N/A;  . Venia Minks DILATION N/A 09/10/2017   Procedure: Venia Minks DILATION;  Surgeon: Daneil Dolin, MD;  [facility];   Service: Endoscopy;  Laterality: N/A;  . SAVORY DILATION  10/28/2011   Procedure: SAVORY DILATION;  Surgeon: Daneil Dolin, MD;  [facility]: [facility];  Service: Endoscopy;  Laterality: N/A;  . TONSILLECTOMY    . TOTAL ANKLE ARTHROPLASTY Left 11/25/2013   Procedure: TOTAL ANKLE ARTHOPLASTY;  Surgeon: Wylene Simmer, MD;  Location: [place];  Service: Orthopedics;  Laterality: Left;  . TOTAL ANKLE ARTHROPLASTY Right 07/14/2014   dr hewitt  . TOTAL ANKLE ARTHROPLASTY Right 07/14/2014   Procedure: RIGHT TOTAL ANKLE ARTHOPLASTY;  Surgeon: Wylene Simmer, MD;  Location: [place];  Service: Orthopedics;  Laterality: Right;    Family History  Problem Relation Age of Onset  . Prostate cancer Father   . Cancer Father        prostate to bone  . Hypertension Father   . Prostate cancer Son 7  . ALS Son   . Prostate cancer Brother   . Heart disease Mother        died at 81  . Arthritis Mother   . Colon cancer Neg Hx     Social History   Socioeconomic History  . Marital status: Married    Spouse name: Candace  . Number of children: 2  . Years of education: Not on file  . Highest education level: Bachelor's degree (e.g., BA, AB, BS)  Occupational History  . Occupation: retired; Ambulance person: RETIRED  Tobacco Use  . Smoking status: Never Smoker  . Smokeless tobacco: Never Used  Vaping Use  . Vaping Use: Never used  Substance and Sexual Activity  . Alcohol use: Yes    Alcohol/week: 2.0 standard drinks    Types: 1 Glasses of wine, 1 Cans of beer per week    Comment: glass of wine or "couple of beers" occasionally   . Drug use: No  . Sexual activity: Yes    Partners: Female  Other Topics Concern  . Not on file  Social History Narrative   Lives with wife Candace.       Moved from [place], [place]   Retired from [employer].   Plays golf.   Works with [employer] and [employer] for [employer].   Wears seatbelt.   Drives.   Eats all food groups.   Never smoked.   Married for  over 32 years June  (2020)   Has two grown children, live in [place] and [place].   Social Determinants of Health   Financial Resource Strain: Low Risk   . Difficulty of Paying Living Expenses: Not hard at all  Food Insecurity: No Food Insecurity  . Worried About Charity fundraiser in the Last Year: Never true  . Ran Out of Food in the Last Year: Never true  Transportation Needs: No Transportation Needs  . Lack of Transportation (Medical): No  . Lack of Transportation (Non-Medical): No  Physical Activity: Sufficiently Active  . Days of Exercise per Week: 7 days  .  Minutes of Exercise per Session: 60 min  Stress: No Stress Concern Present  . Feeling of Stress : Not at all  Social Connections: Socially Integrated  . Frequency of Communication with Friends and Family: More than three times a week  . Frequency of Social Gatherings with Friends and Family: More than three times a week  . Attends Religious Services: More than 4 times per year  . Active Member of Clubs or Organizations: Yes  . Attends Archivist Meetings: More than 4 times per year  . Marital Status: Married  Human resources officer Violence: Not on file    Outpatient Medications Prior to Visit  Medication Sig Dispense Refill  . amLODipine (NORVASC) 5 MG tablet TAKE 1 TABLET BY MOUTH  DAILY 90 tablet 3  . diphenhydramine-acetaminophen (TYLENOL PM) 25-500 MG TABS tablet Take 1 tablet by mouth at bedtime as needed (sleep and mild discomfort).     . dofetilide (TIKOSYN) 500 MCG capsule Take 1 capsule (500 mcg total) by mouth 2 (two) times daily. 180 capsule 2  . ELIQUIS 5 MG TABS tablet TAKE 1 TABLET BY MOUTH  TWICE DAILY 180 tablet 1  . finasteride (PROSCAR) 5 MG tablet Take 1 tablet by mouth daily.    . fluticasone (FLONASE) 50 MCG/ACT nasal spray Place 1 spray into both nostrils daily as needed for allergies. 48 g 3  . gabapentin (NEURONTIN) 300 MG capsule Take 300 mg by mouth 2 (two) times daily.     Marland Kitchen ibuprofen  (ADVIL,MOTRIN) 200 MG tablet Take 400 mg by mouth as needed for headache (headache/pain.).     Marland Kitchen levothyroxine (SYNTHROID) 75 MCG tablet Take 1 tablet (75 mcg total) by mouth daily. 90 tablet 1  . loratadine (CLARITIN) 10 MG tablet Take 10 mg by mouth daily as needed for allergies.    Marland Kitchen losartan (COZAAR) 50 MG tablet TAKE 1 TABLET BY MOUTH  DAILY 90 tablet 3  . Omega-3 Fatty Acids (RA FISH OIL) 1400 MG CPDR Take 1,400 mg by mouth daily.    . pantoprazole (PROTONIX) 40 MG tablet TAKE 1 TABLET BY MOUTH  DAILY BEFORE BREAKFAST 90 tablet 3  . Polyethyl Glycol-Propyl Glycol 0.4-0.3 % SOLN Place 1 drop into both eyes 2 (two) times daily as needed (dry eyes).     . Tamsulosin HCl (FLOMAX) 0.4 MG CAPS Take 0.4 mg by mouth daily after breakfast.     . vitamin B-12 (CYANOCOBALAMIN) 1000 MCG tablet Take 1,000 mcg by mouth daily.    . Chlorphen-PE-Acetaminophen (NOREL AD) 4-10-325 MG TABS Take 1 tablet by mouth every 4 (four) hours as needed (nasal congestion, cold symptoms). (Patient not taking: Reported on 08/03/2020) 20 tablet 1  . finasteride (PROSCAR) 5 MG tablet Take 5 mg by mouth daily.    Marland Kitchen levofloxacin (LEVAQUIN) 500 MG tablet Take 1 tablet (500 mg total) by mouth daily. 7 tablet 0  . tamsulosin (FLOMAX) 0.4 MG CAPS capsule Take 1 capsule by mouth daily.     No facility-administered medications prior to visit.    Allergies  Allergen Reactions  . Tetanus Toxoids Swelling    Face swelling  . Statins Other (See Comments)    Hard to walk, severe leg cramps Other reaction(s): Other (See Comments) Muscle weakness "unable to walk"  . Dicyclomine Other (See Comments)    constipation  . Sulfamethoxazole     Other reaction(s): Other (See Comments) unknown  . Doxycycline Rash    Rash only  . Oxycodone Other (See Comments)  Severe constipation  . Penicillins Swelling and Rash    Took as a child, developed rash over time Has patient had a PCN reaction causing immediate rash,  facial/tongue/throat swelling, SOB or lightheadedness with hypotension: Yes Has patient had a PCN reaction causing severe rash involving mucus membranes or skin necrosis: Unknown Has patient had a PCN reaction that required hospitalization: No Has patient had a PCN reaction occurring within the last 10 years: No If all of the above answers are "NO", then may proceed with Cephalosporin use.  Other reaction(s): Other (See Comments) unknown  . Sulfa Antibiotics Swelling and Rash  . Xarelto [Rivaroxaban] Rash    rash    Review of Systems  Constitutional: Positive for fatigue. Negative for chills and fever.  HENT: Positive for postnasal drip. Negative for sinus pressure, sinus pain and sore throat.   Respiratory: Negative.   Cardiovascular: Negative.        Objective:    Physical Exam Constitutional:      Appearance: Normal appearance.  HENT:     Right Ear: Tympanic membrane, ear canal and external ear normal.     Left Ear: Tympanic membrane, ear canal and external ear normal.     Nose: Nose normal.     Mouth/Throat:     Mouth: Mucous membranes are moist.     Pharynx: Oropharynx is clear.  Cardiovascular:     Rate and Rhythm: Normal rate and regular rhythm.     Pulses: Normal pulses.     Heart sounds: Normal heart sounds.  Pulmonary:     Effort: Pulmonary effort is normal.     Breath sounds: Normal breath sounds.  Neurological:     Mental Status: He is alert.     BP (!) 141/83   Pulse 80   Resp 15   Ht _0  (1.803 m)   Wt 198 lb (89.8 kg)   SpO2 96%   BMI 27.62 kg/m  Wt Readings from Last 3 Encounters:  08/03/20 198 lb (89.8 kg)  07/10/20 200 lb (90.7 kg)  07/06/20 202 lb (91.6 kg)    There are no preventive care reminders to display for this patient.  There are no preventive care reminders to display for this patient.   Lab Results  Component Value Date   TSH 3.940 04/28/2020   Lab Results  Component Value Date   WBC 12.1 (H) 07/07/2020   HGB 13.1  07/07/2020   HCT 40.7 07/07/2020   MCV 92.3 07/07/2020   PLT 229 07/07/2020   Lab Results  Component Value Date   NA 139 07/07/2020   K 3.8 07/07/2020   CO2 24 07/07/2020   GLUCOSE 126 (H) 07/07/2020   BUN 18 07/07/2020   CREATININE 1.18 07/07/2020   BILITOT 0.4 05/19/2020   ALKPHOS 79 05/19/2020   AST 18 05/19/2020   ALT 8 05/19/2020   PROT 6.4 05/19/2020   ALBUMIN 4.0 05/19/2020   CALCIUM 9.2 07/07/2020   ANIONGAP 11 07/07/2020   Lab Results  Component Value Date   CHOL 200 (H) 04/28/2020   Lab Results  Component Value Date   HDL 43 04/28/2020   Lab Results  Component Value Date   LDLCALC 121 (H) 04/28/2020   Lab Results  Component Value Date   TRIG 203 (H) 04/28/2020   Lab Results  Component Value Date   CHOLHDL 4.7 04/28/2020   Lab Results  Component Value Date   HGBA1C 5.5 04/28/2020       Assessment & Plan:  Problem List Items Addressed This Visit      Other   Fatigue    -started after sinus infection -he states he had 2 ticks attached to him over the summer -will check for lyme as well as thyroid panel and CBC with CMP      Relevant Orders   Lyme Ab/Western Blot Reflex   TSH + free T4   Rhinorrhea    -has had runny nose since having a sinus infection -has improved some with norel -he has multiple allergies -uses flonase and claritin -Rx. Montelukast -if no improvement, would consider ENT or allergist referral      Relevant Medications   montelukast (SINGULAIR) 10 MG tablet   Other Relevant Orders   CBC with Differential/Platelet   CMP14+EGFR       Meds ordered this encounter  Medications  . montelukast (SINGULAIR) 10 MG tablet    Sig: Take 1 tablet (10 mg total) by mouth at bedtime.    Dispense:  30 tablet    Refill:  Landa, NP

## 2020-08-03 NOTE — Assessment & Plan Note (Signed)
-  has had runny nose since having a sinus infection -has improved some with norel -he has multiple allergies -uses flonase and claritin -Rx. Montelukast -if no improvement, would consider ENT or allergist referral

## 2020-08-03 NOTE — Patient Instructions (Signed)
For fatigue, I am drawing labs including a complete blood count, metabolic panel, lyme titer, and thyroid panel.  For allergies, I added montelukast which may help with your post nasal drip.

## 2020-08-07 NOTE — Progress Notes (Signed)
The lyme titer has not resulted yet, but the thyroid labs were normal.  The only other thing that may cause fatigue is some very mild anemia.  Your Hgb is 12.1, and normal is 13.  Usually, this is not low enough to notice, be we can give you hemoccult cards to check for blood in your stool that may not be visible.

## 2020-08-08 ENCOUNTER — Other Ambulatory Visit: Payer: Self-pay | Admitting: Nurse Practitioner

## 2020-08-08 DIAGNOSIS — R1310 Dysphagia, unspecified: Secondary | ICD-10-CM

## 2020-08-08 DIAGNOSIS — K2100 Gastro-esophageal reflux disease with esophagitis, without bleeding: Secondary | ICD-10-CM

## 2020-08-08 LAB — CBC WITH DIFFERENTIAL/PLATELET
Basophils Absolute: 0 10*3/uL (ref 0.0–0.2)
Basos: 0 %
EOS (ABSOLUTE): 0.4 10*3/uL (ref 0.0–0.4)
Eos: 5 %
Hematocrit: 37 % — ABNORMAL LOW (ref 37.5–51.0)
Hemoglobin: 12.1 g/dL — ABNORMAL LOW (ref 13.0–17.7)
Immature Grans (Abs): 0 10*3/uL (ref 0.0–0.1)
Immature Granulocytes: 0 %
Lymphocytes Absolute: 2.3 10*3/uL (ref 0.7–3.1)
Lymphs: 24 %
MCH: 29 pg (ref 26.6–33.0)
MCHC: 32.7 g/dL (ref 31.5–35.7)
MCV: 89 fL (ref 79–97)
Monocytes Absolute: 0.5 10*3/uL (ref 0.1–0.9)
Monocytes: 5 %
Neutrophils Absolute: 6.3 10*3/uL (ref 1.4–7.0)
Neutrophils: 66 %
Platelets: 286 10*3/uL (ref 150–450)
RBC: 4.17 x10E6/uL (ref 4.14–5.80)
RDW: 13.6 % (ref 11.6–15.4)
WBC: 9.6 10*3/uL (ref 3.4–10.8)

## 2020-08-08 LAB — CMP14+EGFR
ALT: 5 IU/L (ref 0–44)
AST: 14 IU/L (ref 0–40)
Albumin/Globulin Ratio: 1.3 (ref 1.2–2.2)
Albumin: 3.7 g/dL (ref 3.6–4.6)
Alkaline Phosphatase: 81 IU/L (ref 44–121)
BUN/Creatinine Ratio: 17 (ref 10–24)
BUN: 20 mg/dL (ref 8–27)
Bilirubin Total: 0.2 mg/dL (ref 0.0–1.2)
CO2: 22 mmol/L (ref 20–29)
Calcium: 9.6 mg/dL (ref 8.6–10.2)
Chloride: 104 mmol/L (ref 96–106)
Creatinine, Ser: 1.21 mg/dL (ref 0.76–1.27)
GFR calc Af Amer: 65 mL/min/{1.73_m2} (ref >59)
GFR calc non Af Amer: 56 mL/min/{1.73_m2} — ABNORMAL LOW (ref >59)
Globulin, Total: 2.8 g/dL (ref 1.5–4.5)
Glucose: 108 mg/dL — ABNORMAL HIGH (ref 65–99)
Potassium: 4.4 mmol/L (ref 3.5–5.2)
Sodium: 140 mmol/L (ref 134–144)
Total Protein: 6.5 g/dL (ref 6.0–8.5)

## 2020-08-08 LAB — TSH+FREE T4
Free T4: 1.47 ng/dL (ref 0.82–1.77)
TSH: 2.61 u[IU]/mL (ref 0.450–4.500)

## 2020-08-08 LAB — LYME AB/WESTERN BLOT REFLEX
LYME DISEASE AB, QUANT, IGM: <0.8 index (ref 0.00–0.79)
Lyme IgG/IgM Ab: <0.91 {ISR} (ref 0.00–0.90)

## 2020-08-08 NOTE — Progress Notes (Signed)
Lyme titer was negative

## 2020-08-17 ENCOUNTER — Other Ambulatory Visit: Payer: Self-pay

## 2020-08-17 ENCOUNTER — Ambulatory Visit: Payer: Medicare Other | Admitting: Student

## 2020-08-17 ENCOUNTER — Encounter: Payer: Self-pay | Admitting: Student

## 2020-08-17 VITALS — BP 128/72 | HR 72 | Resp 16 | Ht 71.0 in | Wt 197.8 lb

## 2020-08-17 DIAGNOSIS — I10 Essential (primary) hypertension: Secondary | ICD-10-CM

## 2020-08-17 DIAGNOSIS — I4819 Other persistent atrial fibrillation: Secondary | ICD-10-CM | POA: Diagnosis not present

## 2020-08-17 NOTE — Progress Notes (Signed)
Cardiology Office Note    Date:  08/17/2020   ID:  Wilkins, Brent 06/25/1941, MRN [id number]  PCP:  [name], NP  Cardiologist: Dorris Carnes, MD   EP: Dr. Curt Bears   Chief Complaint  Patient presents with  . Follow-up    1 month visit    History of Present Illness:    Brent Wilkins is a 80 y.o. male with past medical history ofparoxysmal atrial fibrillation(s/p ablation in 10/2019), HTN, and HLD who presents to the office today for 25-month follow-up.   He was last examined by Brent Memos, NP on 07/06/2020 for evaluation of fatigue. He reported being under increased stress over the past 2 weeks as his son who has ALS was staying at the home. He did report worsening fatigue as well. Repeat EKG showed normal sinus rhythm with first-degree AV block and known RBBB. A follow-up echocardiogram was recommended and showed a preserved EF greater than 75% with no regional wall motion abnormalities. He did have grade 2 diastolic dysfunction and biatrial dilation with no significant valve abnormalities.  In talking with the patient today, he reports having a sinus infection over the past month and was on antibiotic therapy intermittently per his PCP. He is still using OTC decongestants as needed but says his symptoms have significant improved. Breathing is now at baseline and he denies any recent orthopnea or PND. No recent chest pain or palpitations. No dizziness or presyncope.  He is gradually trying to increase his activity level again as he was previously very active outside but this was recently limited due to his respiratory issues.   He reports good compliance with Eliquis. Denies any evidence of active bleeding.    Past Medical History:  Diagnosis Date  . Allergy    hay fever;  seasonal  . Arthritis    right ankle  . Cataract    surgery 2016  . Chronic kidney disease    stones many years ago  . Constipation 12/09/2018  . Dizziness 12/13/2015  . Dysphagia   . Family  history of malignant neoplasm of prostate 12/12/2017  . Frequent urination at night   . GERD (gastroesophageal reflux disease)   . History of arthroplasty of right ankle 02/04/2020  . History of kidney stones   . History of prostate cancer 06/16/2017  . HTN (hypertension)   . Hyperlipidemia LDL goal <100 10/26/2019  . Hypertension    Phreesia 04/24/2020  . Hypothyroidism   . 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 Neuropathy    Bilateral ankles  . Paroxysmal atrial fibrillation (HCC)    a. on Tikosyn and Eliquis b. s/p ablation in 10/2019 due to break-through episodes while on Tikosyn.   . Polyneuropathy 06/16/2017  . 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 Prostate cancer [facility]) 2010   Dr. Rosana Hoes  . Schatzki's ring 10/22/2011  . Seasonal allergies   . Syncope  dx. 8 yrs ago  . Syncope, cardiogenic 03/20/2017  . Vaccine counseling 09/15/2019    Past Surgical History:  Procedure Laterality Date  . APPENDECTOMY    . ATRIAL FIBRILLATION ABLATION N/A 10/06/2019   Procedure: ATRIAL FIBRILLATION ABLATION;  Surgeon: Constance Haw, MD;  Location: [facility];  Service: Cardiovascular;  Laterality: N/A;  . CATARACT EXTRACTION W/PHACO Left 03/27/2015   Procedure: CATARACT EXTRACTION PHACO AND INTRAOCULAR LENS PLACEMENT LEFT EYE CDE=11.73;  Surgeon: Tonny Branch, MD;  Location: [facility];  Service: Ophthalmology;  Laterality: Left;  .  CATARACT EXTRACTION W/PHACO Right 03/30/2015   Procedure: CATARACT EXTRACTION PHACO AND INTRAOCULAR LENS PLACEMENT RIGHT EYE CDE=7.35;  Surgeon: Tonny Branch, MD;  Location: [facility];  Service: Ophthalmology;  Laterality: Right;  . CHOLECYSTECTOMY    . COLONOSCOPY  07/23/10   Dr. Vivi Ferns rectum, scattered pancolonic diverticula  . COLONOSCOPY  08/10/1999   internal hemorrhoids,inflammatory polyp  . ESOPHAGOGASTRODUODENOSCOPY  04/12/08   Prominent Schatzki's ring, erosive reflux esophagitis, multiple antral erosions, small hiatal hernia, reactive  gastropathy, status post dilation with 50F  . ESOPHAGOGASTRODUODENOSCOPY  10/28/2011   Procedure: ESOPHAGOGASTRODUODENOSCOPY (EGD);  Surgeon: Daneil Dolin, MD;  [facility];  Service: Endoscopy;  Laterality: N/A;  1:30  . ESOPHAGOGASTRODUODENOSCOPY N/A 09/10/2017   Dr. Gala Romney: Schatkzi's ring s/p dilation at GE junction, mild esophageal reflux esophagitis, medium sized hiatal hernia, normal duodenum. 56 and 92 Fr dilation  . EYE SURGERY    . JOINT REPLACEMENT N/A    Phreesia 01/15/2020  . MALONEY DILATION  10/28/2011   Procedure: Venia Minks DILATION;  Surgeon: Daneil Dolin, MD;  [facility];  Service: Endoscopy;  Laterality: N/A;  . Venia Minks DILATION N/A 09/10/2017   Procedure: Venia Minks DILATION;  Surgeon: Daneil Dolin, MD;  [facility];  Service: Endoscopy;  Laterality: N/A;  . SAVORY DILATION  10/28/2011   Procedure: SAVORY DILATION;  Surgeon: Daneil Dolin, MD;  [facility];  Service: Endoscopy;  Laterality: N/A;  . TONSILLECTOMY    . TOTAL ANKLE ARTHROPLASTY Left 11/25/2013   Procedure: TOTAL ANKLE ARTHOPLASTY;  Surgeon: Wylene Simmer, MD;  Location: [facility];  Service: Orthopedics;  Laterality: Left;  . TOTAL ANKLE ARTHROPLASTY Right 07/14/2014   dr hewitt  . TOTAL ANKLE ARTHROPLASTY Right 07/14/2014   Procedure: RIGHT TOTAL ANKLE ARTHOPLASTY;  Surgeon: Wylene Simmer, MD;  Location: [facility];  Service: Orthopedics;  Laterality: Right;    Current Medications: Outpatient Medications Prior to Visit  Medication Sig Dispense Refill  . amLODipine (NORVASC) 5 MG tablet TAKE 1 TABLET BY MOUTH  DAILY 90 tablet 3  . diphenhydramine-acetaminophen (TYLENOL PM) 25-500 MG TABS tablet Take 1 tablet by mouth at bedtime as needed (sleep and mild discomfort).     . dofetilide (TIKOSYN) 500 MCG capsule Take 1 capsule (500 mcg total) by mouth 2 (two) times daily. 180 capsule 2  . ELIQUIS 5 MG TABS tablet TAKE 1 TABLET BY MOUTH  TWICE DAILY 180 tablet 1  . finasteride  (PROSCAR) 5 MG tablet Take 1 tablet by mouth daily.    . fluticasone (FLONASE) 50 MCG/ACT nasal spray Place 1 spray into both nostrils daily as needed for allergies. 48 g 3  . gabapentin (NEURONTIN) 300 MG capsule Take 300 mg by mouth 2 (two) times daily.     Marland Kitchen ibuprofen (ADVIL,MOTRIN) 200 MG tablet Take 400 mg by mouth as needed for headache (headache/pain.).     Marland Kitchen levothyroxine (SYNTHROID) 75 MCG tablet Take 1 tablet (75 mcg total) by mouth daily. 90 tablet 1  . loratadine (CLARITIN) 10 MG tablet Take 10 mg by mouth daily as needed for allergies.    Marland Kitchen losartan (COZAAR) 50 MG tablet TAKE 1 TABLET BY MOUTH  DAILY 90 tablet 3  . montelukast (SINGULAIR) 10 MG tablet Take 1 tablet (10 mg total) by mouth at bedtime. 30 tablet 3  . Omega-3 Fatty Acids (RA FISH OIL) 1400 MG CPDR Take 1,400 mg by mouth daily.    . pantoprazole (PROTONIX) 40 MG tablet TAKE 1 TABLET BY  MOUTH  DAILY BEFORE BREAKFAST 90 tablet 3  . Polyethyl Glycol-Propyl Glycol 0.4-0.3 % SOLN Place 1 drop into both eyes 2 (two) times daily as needed (dry eyes).     . Tamsulosin HCl (FLOMAX) 0.4 MG CAPS Take 0.4 mg by mouth daily after breakfast.     . vitamin B-12 (CYANOCOBALAMIN) 1000 MCG tablet Take 1,000 mcg by mouth daily.    . Chlorphen-PE-Acetaminophen (NOREL AD) 4-10-325 MG TABS Take 1 tablet by mouth every 4 (four) hours as needed (nasal congestion, cold symptoms). (Patient not taking: No sig reported) 20 tablet 1   No facility-administered medications prior to visit.     Allergies:   Tetanus toxoids, Statins, Dicyclomine, Sulfamethoxazole, Doxycycline, Oxycodone, Penicillins, Sulfa antibiotics, and Xarelto [rivaroxaban]   Social History   Socioeconomic History  . Marital status: Married    Spouse name: Candace  . Number of children: 2  . Years of education: Not on file  . Highest education level: Bachelor's degree (e.g., BA, AB, BS)  Occupational History  . Occupation: retired; Ambulance person: RETIRED   Tobacco Use  . Smoking status: Never Smoker  . Smokeless tobacco: Never Used  Vaping Use  . Vaping Use: Never used  Substance and Sexual Activity  . Alcohol use: Yes    Alcohol/week: 2.0 standard drinks    Types: 1 Glasses of wine, 1 Cans of beer per week    Comment: glass of wine or "couple of beers" occasionally   . Drug use: No  . Sexual activity: Yes    Partners: Female  Other Topics Concern  . Not on file  Social History Narrative   Lives with wife Candace.       Moved from [place], [place]   Retired from [employer].   Plays golf.   Works with [employer] and [employer] for [employer].   Wears seatbelt.   Drives.   Eats all food groups.   Never smoked.   Married for over 60 years June  (2020)   Has two grown children, live in [place] and [place].   Social Determinants of Health   Financial Resource Strain: Low Risk   . Difficulty of Paying Living Expenses: Not hard at all  Food Insecurity: No Food Insecurity  . Worried About Charity fundraiser in the Last Year: Never true  . Ran Out of Food in the Last Year: Never true  Transportation Needs: No Transportation Needs  . Lack of Transportation (Medical): No  . Lack of Transportation (Non-Medical): No  Physical Activity: Sufficiently Active  . Days of Exercise per Week: 7 days  . Minutes of Exercise per Session: 60 min  Stress: No Stress Concern Present  . Feeling of Stress : Not at all  Social Connections: Socially Integrated  . Frequency of Communication with Friends and Family: More than three times a week  . Frequency of Social Gatherings with Friends and Family: More than three times a week  . Attends Religious Services: More than 4 times per year  . Active Member of Clubs or Organizations: Yes  . Attends Archivist Meetings: More than 4 times per year  . Marital Status: Married     Family History:  The patient's family history includes ALS in his son; Arthritis in his mother; Cancer in his father; Heart  disease in his mother; Hypertension in his father; Prostate cancer in his brother and father; Prostate cancer (age of onset: 28) in his son.   Review  of Systems:   Please see the history of present illness.     General:  No chills, fever, night sweats or weight changes.  Cardiovascular:  No chest pain, dyspnea on exertion, edema, orthopnea, palpitations, paroxysmal nocturnal dyspnea. Dermatological: No rash, lesions/masses Respiratory: No cough, dyspnea. Positive for sinus congestion.  Urologic: No hematuria, dysuria Abdominal:   No nausea, vomiting, diarrhea, bright red blood per rectum, melena, or hematemesis Neurologic:  No visual changes, wkns, changes in mental status. All other systems reviewed and are otherwise negative except as noted above.   Physical Exam:    VS:  BP 128/72   Pulse 72   Resp 16   Ht 5\' 11"  (1.803 m)   Wt 197 lb 12.8 oz (89.7 kg)   SpO2 98%   BMI 27.59 kg/m    General: Well developed, well nourished,male appearing in no acute distress. Head: Normocephalic, atraumatic. Neck: No carotid bruits. JVD not elevated.  Lungs: Respirations regular and unlabored, without wheezes or rales.  Heart: Regular rate and rhythm. No S3 or S4.  No murmur, no rubs, or gallops appreciated. Abdomen: Appears non-distended. No obvious abdominal masses. Msk:  Strength and tone appear normal for age. No obvious joint deformities or effusions. Extremities: No clubbing or cyanosis. No lower extremity edema.  Distal pedal pulses are 2+ bilaterally. Neuro: Alert and oriented X 3. Moves all extremities spontaneously. No focal deficits noted. Psych:  Responds to questions appropriately with a normal affect. Skin: No rashes or lesions noted  Wt Readings from Last 3 Encounters:  08/17/20 197 lb 12.8 oz (89.7 kg)  08/03/20 198 lb (89.8 kg)  07/10/20 200 lb (90.7 kg)      Studies/Labs Reviewed:   EKG:  EKG is not ordered today.    Recent Labs: 11/03/2019: Magnesium  2.0 08/03/2020: ALT 5; BUN 20; Creatinine, Ser 1.21; Hemoglobin 12.1; Platelets 286; Potassium 4.4; Sodium 140; TSH 2.610   Lipid Panel    Component Value Date/Time   CHOL 200 (H) 04/28/2020 0834   TRIG 203 (H) 04/28/2020 0834   HDL 43 04/28/2020 0834   CHOLHDL 4.7 04/28/2020 0834   CHOLHDL 3.6 01/28/2020 0952   VLDL 30 09/22/2017 1444   LDLCALC 121 (H) 04/28/2020 0834   LDLCALC 104 (H) 01/28/2020 [id number]    Additional studies/ records that were reviewed today include:   Echocardiogram: 07/07/2020 IMPRESSIONS    1. Left ventricular ejection fraction, by estimation, is >75%. The left  ventricle has normal function. The left ventricle has no regional wall  motion abnormalities. There is mild left ventricular hypertrophy. Left  ventricular diastolic parameters are  consistent with Grade II diastolic dysfunction (pseudonormalization).  Elevated left atrial pressure.  2. Right ventricular systolic function is normal. The right ventricular  size is normal.  3. Left atrial size was severely dilated.  4. Right atrial size was mildly dilated.  5. The mitral valve is normal in structure. No evidence of mitral valve  regurgitation. No evidence of mitral stenosis.  6. The aortic valve is tricuspid. Aortic valve regurgitation is not  visualized. No aortic stenosis is present.  7. The inferior vena cava is normal in size with greater than 50%  respiratory variability, suggesting right atrial pressure of 3 mmHg.   Assessment:    1. Persistent atrial fibrillation ([place])   2. Essential hypertension      Plan:   In order of problems listed above:  1. Persistent Atrial Fibrillation - He remains on Tikosyn 500 mcg BID and is s/p  ablation in 10/2019 due to break-through episodes. He denies any recent palpitations and is in NSR by examination today.  - Continue current medication regimen with Tikosyn 500 mcg BID and Eliquis 5mg  BID for anticoagulation. K+ was stable at 4.4 by recent  labs on 08/03/2020 with Hgb at 12.1 and platelets 286. - Was encouraged to keep scheduled follow-up with EP in 10/2020 as will defer continuation of antiarrhythmic therapy to Dr. Curt Bears given the patient's prior ablation.   2. HTN - BP was initially elevated, rechecked and improved to 128/72. Continue current medication regimen with Amlodipine 5mg  daily and Losartan 50mg  daily. He has reduced his use of decongestants and I did recommend he use plain Mucinex in the future if needed.     Medication Adjustments/Labs and Tests Ordered: Current medicines are reviewed at length with the patient today.  Concerns regarding medicines are outlined above.  Medication changes, Labs and Tests ordered today are listed in the Patient Instructions below. Patient Instructions  Medication Instructions:  Your physician recommends that you continue on your current medications as directed. Please refer to the Current Medication list given to you today.  *If you need a refill on your cardiac medications before your next appointment, please call your pharmacy*   Lab Work: NONE   If you have labs (blood work) drawn today and your tests are completely normal, you will receive your results only by: Marland Kitchen MyChart Message (if you have MyChart) OR . A paper copy in the mail If you have any lab test that is abnormal or we need to change your treatment, we will call you to review the results.   Testing/Procedures: NONE    Follow-Up: At [facility], you and your health needs are our priority.  As part of our continuing mission to provide you with exceptional heart care, we have created designated Provider Care Teams.  These Care Teams include your primary Cardiologist (physician) and Advanced Practice Providers (APPs -  Physician Assistants and Nurse Practitioners) who all work together to provide you with the care you need, when you need it.  We recommend signing up for the patient portal called "MyChart".  Sign  up information is provided on this After Visit Summary.  MyChart is used to connect with patients for Virtual Visits (Telemedicine).  Patients are able to view lab/test results, encounter notes, upcoming appointments, etc.  Non-urgent messages can be sent to your provider as well.   To learn more about what you can do with MyChart, go to [email or web address].    Your next appointment:   6 month(s)  The format for your next appointment:   In Person  Provider:   Dorris Carnes, MD   Other Instructions Thank you for choosing [facility]!       Signed, Erma Heritage, PA-C  08/17/2020 7:31 PM    [place] [address] [place], [place] [address] Phone: [phone] Fax: [phone]

## 2020-08-17 NOTE — Patient Instructions (Signed)
Medication Instructions:  Your physician recommends that you continue on your current medications as directed. Please refer to the Current Medication list given to you today.  *If you need a refill on your cardiac medications before your next appointment, please call your pharmacy*   Lab Work: NONE   If you have labs (blood work) drawn today and your tests are completely normal, you will receive your results only by: . MyChart Message (if you have MyChart) OR . A paper copy in the mail If you have any lab test that is abnormal or we need to change your treatment, we will call you to review the results.   Testing/Procedures:  NONE    Follow-Up: At [facility], you and your health needs are our priority.  As part of our continuing mission to provide you with exceptional heart care, we have created designated Provider Care Teams.  These Care Teams include your primary Cardiologist (physician) and Advanced Practice Providers (APPs -  Physician Assistants and Nurse Practitioners) who all work together to provide you with the care you need, when you need it.  We recommend signing up for the patient portal called "MyChart".  Sign up information is provided on this After Visit Summary.  MyChart is used to connect with patients for Virtual Visits (Telemedicine).  Patients are able to view lab/test results, encounter notes, upcoming appointments, etc.  Non-urgent messages can be sent to your provider as well.   To learn more about what you can do with MyChart, go to [email or web address].    Your next appointment:   6 month(s)  The format for your next appointment:   In Person  Provider:   Paula Ross, MD   Other Instructions Thank you for choosing [facility]!    

## 2020-08-22 ENCOUNTER — Other Ambulatory Visit: Payer: Self-pay | Admitting: Family Medicine

## 2020-08-24 DIAGNOSIS — N138 Other obstructive and reflux uropathy: Secondary | ICD-10-CM | POA: Diagnosis not present

## 2020-09-14 ENCOUNTER — Other Ambulatory Visit: Payer: Self-pay | Admitting: Cardiology

## 2020-09-14 NOTE — Telephone Encounter (Signed)
Pt last saw [place], [place] on 08/17/20, last labs 08/03/20 Creat 1.21, age 80, weight 89.7kg, based on specified criteria pt is on appropriate dosage of Eliquis 5mg  BID.  Will refill rx.

## 2020-09-27 ENCOUNTER — Telehealth: Payer: Self-pay

## 2020-09-27 NOTE — Telephone Encounter (Signed)
Did you already advise him to contact the [facility], or current PCP for referral?

## 2020-09-27 NOTE — Telephone Encounter (Signed)
Patient father Geral called ask if you would give him a call about his son to get setup IV device port in chest. Patient # [phone].

## 2020-09-27 NOTE — Telephone Encounter (Signed)
I already spoke with him disregard it

## 2020-10-06 DIAGNOSIS — M19071 Primary osteoarthritis, right ankle and foot: Secondary | ICD-10-CM | POA: Diagnosis not present

## 2020-10-16 ENCOUNTER — Other Ambulatory Visit: Payer: Self-pay

## 2020-10-16 ENCOUNTER — Ambulatory Visit (INDEPENDENT_AMBULATORY_CARE_PROVIDER_SITE_OTHER): Payer: Medicare Other | Admitting: Nurse Practitioner

## 2020-10-16 ENCOUNTER — Encounter: Payer: Self-pay | Admitting: Nurse Practitioner

## 2020-10-16 DIAGNOSIS — E039 Hypothyroidism, unspecified: Secondary | ICD-10-CM

## 2020-10-16 DIAGNOSIS — I4891 Unspecified atrial fibrillation: Secondary | ICD-10-CM | POA: Diagnosis not present

## 2020-10-16 DIAGNOSIS — G250 Essential tremor: Secondary | ICD-10-CM | POA: Diagnosis not present

## 2020-10-16 DIAGNOSIS — E785 Hyperlipidemia, unspecified: Secondary | ICD-10-CM | POA: Diagnosis not present

## 2020-10-16 DIAGNOSIS — I10 Essential (primary) hypertension: Secondary | ICD-10-CM

## 2020-10-16 MED ORDER — AMLODIPINE BESYLATE 10 MG PO TABS: 10.0000 mg | ORAL_TABLET | Freq: Every day | ORAL | 3 refills | Status: DC

## 2020-10-16 NOTE — Progress Notes (Signed)
Established Patient Office Visit  Subjective:  Patient ID: Brent Wilkins, male    DOB: 1941-05-10  Age: 80 y.o. MRN: [id number]  CC:  Chief Complaint  Patient presents with  . Hypertension  . Follow-up    HPI [name] presents for follow-up visit. No acute concerns.  He still has tremor and neuropathy. He has seen Dr. Merlene Laughter in the past, but would prefer to see a different neurologist. He states that his tremors are getting worse. He states that he had meningitis as a child.   Past Medical History:  Diagnosis Date  . Allergy    hay fever;  seasonal  . Arthritis    right ankle  . Cataract    surgery 2016  . Chronic kidney disease    stones many years ago  . Constipation 12/09/2018  . Dizziness 12/13/2015  . Dysphagia   . Family history of malignant neoplasm of prostate 12/12/2017  . Frequent urination at night   . GERD (gastroesophageal reflux disease)   . History of arthroplasty of right ankle 02/04/2020  . History of kidney stones   . History of prostate cancer 06/16/2017  . HTN (hypertension)   . Hyperlipidemia LDL goal <100 10/26/2019  . Hypertension    Phreesia 04/24/2020  . Hypothyroidism   . 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 Neuropathy    Bilateral ankles  . Paroxysmal atrial fibrillation (HCC)    a. on Tikosyn and Eliquis b. s/p ablation in 10/2019 due to break-through episodes while on Tikosyn.   . Polyneuropathy 06/16/2017  . 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 Prostate cancer [facility]) 2010   Dr. Rosana Hoes  . Schatzki's ring 10/22/2011  . Seasonal allergies   . Syncope  dx. 8 yrs ago  . Syncope, cardiogenic 03/20/2017  . Vaccine counseling 09/15/2019    Past Surgical History:  Procedure Laterality Date  . APPENDECTOMY    . ATRIAL FIBRILLATION ABLATION N/A 10/06/2019   Procedure: ATRIAL FIBRILLATION ABLATION;  Surgeon: Constance Haw, MD;  Location: [facility];  Service: Cardiovascular;  Laterality: N/A;  . CATARACT  EXTRACTION W/PHACO Left 03/27/2015   Procedure: CATARACT EXTRACTION PHACO AND INTRAOCULAR LENS PLACEMENT LEFT EYE CDE=11.73;  Surgeon: Tonny Branch, MD;  Location: [facility];  Service: Ophthalmology;  Laterality: Left;  . CATARACT EXTRACTION W/PHACO Right 03/30/2015   Procedure: CATARACT EXTRACTION PHACO AND INTRAOCULAR LENS PLACEMENT RIGHT EYE CDE=7.35;  Surgeon: Tonny Branch, MD;  Location: [facility];  Service: Ophthalmology;  Laterality: Right;  . CHOLECYSTECTOMY    . COLONOSCOPY  07/23/10   Dr. Vivi Ferns rectum, scattered pancolonic diverticula  . COLONOSCOPY  08/10/1999   internal hemorrhoids,inflammatory polyp  . ESOPHAGOGASTRODUODENOSCOPY  04/12/08   Prominent Schatzki's ring, erosive reflux esophagitis, multiple antral erosions, small hiatal hernia, reactive gastropathy, status post dilation with 33F  . ESOPHAGOGASTRODUODENOSCOPY  10/28/2011   Procedure: ESOPHAGOGASTRODUODENOSCOPY (EGD);  Surgeon: Daneil Dolin, MD;  Location: [facility];  Service: Endoscopy;  Laterality: N/A;  1:30  . ESOPHAGOGASTRODUODENOSCOPY N/A 09/10/2017   Dr. Gala Romney: Schatkzi's ring s/p dilation at GE junction, mild esophageal reflux esophagitis, medium sized hiatal hernia, normal duodenum. 56 and 59 Fr dilation  . EYE SURGERY    . JOINT REPLACEMENT N/A    Phreesia 01/15/2020  . MALONEY DILATION  10/28/2011   Procedure: Venia Minks DILATION;  Surgeon: Daneil Dolin, MD;  Location: [facility];  Service: Endoscopy;  Laterality: N/A;  . MALONEY DILATION N/A 09/10/2017  Procedure: MALONEY DILATION;  Surgeon: Corbin Ade, MD;  Location: [facility];  Service: Endoscopy;  Laterality: N/A;  . SAVORY DILATION  10/28/2011   Procedure: SAVORY DILATION;  Surgeon: Corbin Ade, MD;  Location: [facility];  Service: Endoscopy;  Laterality: N/A;  . TONSILLECTOMY    . TOTAL ANKLE ARTHROPLASTY Left 11/25/2013   Procedure: TOTAL ANKLE ARTHOPLASTY;  Surgeon: Toni Arthurs, MD;  Location: [facility];  Service: Orthopedics;  Laterality:  Left;  . TOTAL ANKLE ARTHROPLASTY Right 07/14/2014   dr hewitt  . TOTAL ANKLE ARTHROPLASTY Right 07/14/2014   Procedure: RIGHT TOTAL ANKLE ARTHOPLASTY;  Surgeon: Toni Arthurs, MD;  Location: [facility];  Service: Orthopedics;  Laterality: Right;    Family History  Problem Relation Age of Onset  . Prostate cancer Father   . Cancer Father        prostate to bone  . Hypertension Father   . Prostate cancer Son 43  . ALS Son   . Prostate cancer Brother   . Heart disease Mother        died at 65  . Arthritis Mother   . Colon cancer Neg Hx     Social History   Socioeconomic History  . Marital status: Married    Spouse name: Candace  . Number of children: 2  . Years of education: Not on file  . Highest education level: Bachelor's degree (e.g., BA, AB, BS)  Occupational History  . Occupation: retired; Equities trader: RETIRED  Tobacco Use  . Smoking status: Never Smoker  . Smokeless tobacco: Never Used  Vaping Use  . Vaping Use: Never used  Substance and Sexual Activity  . Alcohol use: Yes    Alcohol/week: 2.0 standard drinks    Types: 1 Glasses of wine, 1 Cans of beer per week    Comment: glass of wine or "couple of beers" occasionally   . Drug use: No  . Sexual activity: Yes    Partners: Female  Other Topics Concern  . Not on file  Social History Narrative   Lives with wife Candace.       Moved from [place], [place] [address]   Retired from [employer].   Plays golf.   Works with [employer] and [employer] for [employer].   Wears seatbelt.   Drives.   Eats all food groups.   Never smoked.   Married for over 55 years June  (2020)   Has two grown children, live in [place] and [place].   Social Determinants of Health   Financial Resource Strain: Low Risk   . Difficulty of Paying Living Expenses: Not hard at all  Food Insecurity: No Food Insecurity  . Worried About Programme researcher, broadcasting/film/video in the Last Year: Never true  . Ran Out of Food in the Last Year: Never true   Transportation Needs: No Transportation Needs  . Lack of Transportation (Medical): No  . Lack of Transportation (Non-Medical): No  Physical Activity: Sufficiently Active  . Days of Exercise per Week: 7 days  . Minutes of Exercise per Session: 60 min  Stress: No Stress Concern Present  . Feeling of Stress : Not at all  Social Connections: Socially Integrated  . Frequency of Communication with Friends and Family: More than three times a week  . Frequency of Social Gatherings with Friends and Family: More than three times a week  . Attends Religious Services: More than 4 times per year  . Active Member of  Clubs or Organizations: Yes  . Attends Archivist Meetings: More than 4 times per year  . Marital Status: Married  Human resources officer Violence: Not on file    Outpatient Medications Prior to Visit  Medication Sig Dispense Refill  . Chlorphen-PE-Acetaminophen (NOREL AD) 4-10-325 MG TABS Take 1 tablet by mouth every 4 (four) hours as needed (nasal congestion, cold symptoms). 20 tablet 1  . diphenhydramine-acetaminophen (TYLENOL PM) 25-500 MG TABS tablet Take 1 tablet by mouth at bedtime as needed (sleep and mild discomfort).     . dofetilide (TIKOSYN) 500 MCG capsule Take 1 capsule (500 mcg total) by mouth 2 (two) times daily. 180 capsule 2  . ELIQUIS 5 MG TABS tablet TAKE 1 TABLET BY MOUTH  TWICE DAILY 180 tablet 1  . finasteride (PROSCAR) 5 MG tablet Take 1 tablet by mouth daily.    . fluticasone (FLONASE) 50 MCG/ACT nasal spray Place 1 spray into both nostrils daily as needed for allergies. 48 g 3  . gabapentin (NEURONTIN) 300 MG capsule Take 300 mg by mouth 2 (two) times daily.     Marland Kitchen ibuprofen (ADVIL,MOTRIN) 200 MG tablet Take 400 mg by mouth as needed for headache (headache/pain.).     Marland Kitchen levothyroxine (SYNTHROID) 75 MCG tablet TAKE 1 TABLET BY MOUTH  DAILY 90 tablet 3  . loratadine (CLARITIN) 10 MG tablet Take 10 mg by mouth daily as needed for allergies.    Marland Kitchen losartan  (COZAAR) 50 MG tablet TAKE 1 TABLET BY MOUTH  DAILY 90 tablet 3  . montelukast (SINGULAIR) 10 MG tablet Take 1 tablet (10 mg total) by mouth at bedtime. 30 tablet 3  . Omega-3 Fatty Acids (RA FISH OIL) 1400 MG CPDR Take 1,400 mg by mouth daily.    . pantoprazole (PROTONIX) 40 MG tablet TAKE 1 TABLET BY MOUTH  DAILY BEFORE BREAKFAST 90 tablet 3  . Polyethyl Glycol-Propyl Glycol 0.4-0.3 % SOLN Place 1 drop into both eyes 2 (two) times daily as needed (dry eyes).     . Tamsulosin HCl (FLOMAX) 0.4 MG CAPS Take 0.4 mg by mouth daily after breakfast.     . vitamin B-12 (CYANOCOBALAMIN) 1000 MCG tablet Take 1,000 mcg by mouth daily.    Marland Kitchen amLODipine (NORVASC) 5 MG tablet TAKE 1 TABLET BY MOUTH  DAILY 90 tablet 3   No facility-administered medications prior to visit.    Allergies  Allergen Reactions  . Tetanus Toxoids Swelling    Face swelling  . Statins Other (See Comments)    Hard to walk, severe leg cramps Other reaction(s): Other (See Comments) Muscle weakness "unable to walk"  . Dicyclomine Other (See Comments)    constipation  . Sulfamethoxazole     Other reaction(s): Other (See Comments) unknown  . Doxycycline Rash    Rash only  . Oxycodone Other (See Comments)    Severe constipation  . Penicillins Swelling and Rash    Took as a child, developed rash over time Has patient had a PCN reaction causing immediate rash, facial/tongue/throat swelling, SOB or lightheadedness with hypotension: Yes Has patient had a PCN reaction causing severe rash involving mucus membranes or skin necrosis: Unknown Has patient had a PCN reaction that required hospitalization: No Has patient had a PCN reaction occurring within the last 10 years: No If all of the above answers are "NO", then may proceed with Cephalosporin use.  Other reaction(s): Other (See Comments) unknown  . Sulfa Antibiotics Swelling and Rash  . Xarelto [Rivaroxaban] Rash  rash    ROS Review of Systems  Constitutional:  Negative.   Respiratory: Negative.   Cardiovascular: Negative.   Neurological: Positive for tremors and headaches.      Objective:    Physical Exam Constitutional:      Appearance: Normal appearance.  Cardiovascular:     Rate and Rhythm: Normal rate. Rhythm irregular.     Pulses: Normal pulses.     Heart sounds: Normal heart sounds.  Pulmonary:     Effort: Pulmonary effort is normal.     Breath sounds: Normal breath sounds.  Neurological:     Mental Status: He is alert.     Comments: Tremor to hands and face today  Psychiatric:        Mood and Affect: Mood normal.        Behavior: Behavior normal.        Thought Content: Thought content normal.        Judgment: Judgment normal.     BP (!) 177/94   Pulse 74   Temp 97.7 F (36.5 C)   Resp 18   Ht $R'5\' 10"'Pg$  (1.778 m)   Wt 200 lb (90.7 kg)   SpO2 98%   BMI 28.70 kg/m  Wt Readings from Last 3 Encounters:  10/16/20 200 lb (90.7 kg)  08/17/20 197 lb 12.8 oz (89.7 kg)  08/03/20 198 lb (89.8 kg)     Health Maintenance Due  Topic Date Due  . COVID-19 Vaccine (4 - Booster for Moderna series) 10/07/2020    There are no preventive care reminders to display for this patient.  Lab Results  Component Value Date   TSH 2.610 08/03/2020   Lab Results  Component Value Date   WBC 9.6 08/03/2020   HGB 12.1 (L) 08/03/2020   HCT 37.0 (L) 08/03/2020   MCV 89 08/03/2020   PLT 286 08/03/2020   Lab Results  Component Value Date   NA 140 08/03/2020   K 4.4 08/03/2020   CO2 22 08/03/2020   GLUCOSE 108 (H) 08/03/2020   BUN 20 08/03/2020   CREATININE 1.21 08/03/2020   BILITOT 0.2 08/03/2020   ALKPHOS 81 08/03/2020   AST 14 08/03/2020   ALT 5 08/03/2020   PROT 6.5 08/03/2020   ALBUMIN 3.7 08/03/2020   CALCIUM 9.6 08/03/2020   ANIONGAP 11 07/07/2020   Lab Results  Component Value Date   CHOL 200 (H) 04/28/2020   Lab Results  Component Value Date   HDL 43 04/28/2020   Lab Results  Component Value Date   LDLCALC  121 (H) 04/28/2020   Lab Results  Component Value Date   TRIG 203 (H) 04/28/2020   Lab Results  Component Value Date   CHOLHDL 4.7 04/28/2020   Lab Results  Component Value Date   HGBA1C 5.5 04/28/2020      Assessment & Plan:   Problem List Items Addressed This Visit      Cardiovascular and Mediastinum   Atrial fibrillation ([place])    -followed by cardiology -takes eliquis and tikosyn      Relevant Medications   amLODipine (NORVASC) 10 MG tablet   Other Relevant Orders   CBC with Differential/Platelet   CMP14+EGFR   HTN, goal below 140/90    -BP elevated today at 177/94, and he states he had been having headaches -INCREASE amlodipine to 10 mg daily      Relevant Medications   amLODipine (NORVASC) 10 MG tablet   Other Relevant Orders   CBC with Differential/Platelet  CMP14+EGFR   Lipid Panel With LDL/HDL Ratio     Endocrine   Hypothyroidism    -will check labs today      Relevant Orders   TSH + free T4     Nervous and Auditory   Benign essential tremor    -referral to neurology as he states tremors are getting worse      Relevant Orders   Ambulatory referral to Neurology     Other   Hyperlipidemia LDL goal <100    -checking labs today      Relevant Medications   amLODipine (NORVASC) 10 MG tablet   Other Relevant Orders   Lipid Panel With LDL/HDL Ratio      Meds ordered this encounter  Medications  . amLODipine (NORVASC) 10 MG tablet    Sig: Take 1 tablet (10 mg total) by mouth daily.    Dispense:  90 tablet    Refill:  3    Follow-up: Return in about 1 month (around 11/16/2020) for BP check.    Noreene Larsson, NP

## 2020-10-16 NOTE — Assessment & Plan Note (Signed)
-  checking labs today 

## 2020-10-16 NOTE — Assessment & Plan Note (Signed)
-  will check labs today 

## 2020-10-16 NOTE — Assessment & Plan Note (Signed)
-  followed by cardiology -takes eliquis and tikosyn

## 2020-10-16 NOTE — Assessment & Plan Note (Signed)
-  BP elevated today at 177/94, and he states he had been having headaches -INCREASE amlodipine to 10 mg daily

## 2020-10-16 NOTE — Assessment & Plan Note (Signed)
-  referral to neurology as he states tremors are getting worse

## 2020-10-17 DIAGNOSIS — I10 Essential (primary) hypertension: Secondary | ICD-10-CM | POA: Diagnosis not present

## 2020-10-17 DIAGNOSIS — E785 Hyperlipidemia, unspecified: Secondary | ICD-10-CM | POA: Diagnosis not present

## 2020-10-17 DIAGNOSIS — E039 Hypothyroidism, unspecified: Secondary | ICD-10-CM | POA: Diagnosis not present

## 2020-10-17 DIAGNOSIS — I4891 Unspecified atrial fibrillation: Secondary | ICD-10-CM | POA: Diagnosis not present

## 2020-10-18 LAB — LIPID PANEL WITH LDL/HDL RATIO
Cholesterol, Total: 158 mg/dL (ref 100–199)
HDL: 45 mg/dL (ref >39)
LDL Chol Calc (NIH): 84 mg/dL (ref 0–99)
LDL/HDL Ratio: 1.9 ratio (ref 0.0–3.6)
Triglycerides: 172 mg/dL — ABNORMAL HIGH (ref 0–149)
VLDL Cholesterol Cal: 29 mg/dL (ref 5–40)

## 2020-10-18 LAB — CBC WITH DIFFERENTIAL/PLATELET
Basophils Absolute: 0.1 10*3/uL (ref 0.0–0.2)
Basos: 1 %
EOS (ABSOLUTE): 0.4 10*3/uL (ref 0.0–0.4)
Eos: 5 %
Hematocrit: 40.2 % (ref 37.5–51.0)
Hemoglobin: 13 g/dL (ref 13.0–17.7)
Immature Grans (Abs): 0 10*3/uL (ref 0.0–0.1)
Immature Granulocytes: 0 %
Lymphocytes Absolute: 2.7 10*3/uL (ref 0.7–3.1)
Lymphs: 40 %
MCH: 28.1 pg (ref 26.6–33.0)
MCHC: 32.3 g/dL (ref 31.5–35.7)
MCV: 87 fL (ref 79–97)
Monocytes Absolute: 0.5 10*3/uL (ref 0.1–0.9)
Monocytes: 7 %
Neutrophils Absolute: 3.2 10*3/uL (ref 1.4–7.0)
Neutrophils: 47 %
Platelets: 279 10*3/uL (ref 150–450)
RBC: 4.62 x10E6/uL (ref 4.14–5.80)
RDW: 14.1 % (ref 11.6–15.4)
WBC: 6.9 10*3/uL (ref 3.4–10.8)

## 2020-10-18 LAB — CMP14+EGFR
ALT: 6 IU/L (ref 0–44)
AST: 17 IU/L (ref 0–40)
Albumin/Globulin Ratio: 1.5 (ref 1.2–2.2)
Albumin: 4.1 g/dL (ref 3.6–4.6)
Alkaline Phosphatase: 77 IU/L (ref 44–121)
BUN/Creatinine Ratio: 13 (ref 10–24)
BUN: 17 mg/dL (ref 8–27)
Bilirubin Total: 0.5 mg/dL (ref 0.0–1.2)
CO2: 22 mmol/L (ref 20–29)
Calcium: 9.4 mg/dL (ref 8.6–10.2)
Chloride: 108 mmol/L — ABNORMAL HIGH (ref 96–106)
Creatinine, Ser: 1.34 mg/dL — ABNORMAL HIGH (ref 0.76–1.27)
Globulin, Total: 2.8 g/dL (ref 1.5–4.5)
Glucose: 101 mg/dL — ABNORMAL HIGH (ref 65–99)
Potassium: 4.8 mmol/L (ref 3.5–5.2)
Sodium: 144 mmol/L (ref 134–144)
Total Protein: 6.9 g/dL (ref 6.0–8.5)
eGFR: 53 mL/min/{1.73_m2} — ABNORMAL LOW (ref >59)

## 2020-10-18 LAB — TSH+FREE T4
Free T4: 1.32 ng/dL (ref 0.82–1.77)
TSH: 3.5 u[IU]/mL (ref 0.450–4.500)

## 2020-10-18 NOTE — Progress Notes (Signed)
Kidney function is just a little low. This can be improved by increasing hydration. Drink more water!

## 2020-10-24 ENCOUNTER — Ambulatory Visit: Payer: Medicare Other | Admitting: Cardiology

## 2020-10-25 ENCOUNTER — Ambulatory Visit: Payer: Medicare Other | Admitting: Nurse Practitioner

## 2020-10-30 ENCOUNTER — Other Ambulatory Visit: Payer: Self-pay | Admitting: Family Medicine

## 2020-10-31 DIAGNOSIS — X32XXXD Exposure to sunlight, subsequent encounter: Secondary | ICD-10-CM | POA: Diagnosis not present

## 2020-10-31 DIAGNOSIS — L570 Actinic keratosis: Secondary | ICD-10-CM | POA: Diagnosis not present

## 2020-11-09 ENCOUNTER — Other Ambulatory Visit: Payer: Self-pay

## 2020-11-09 ENCOUNTER — Encounter: Payer: Self-pay | Admitting: Cardiology

## 2020-11-09 ENCOUNTER — Ambulatory Visit: Payer: Medicare Other | Admitting: Cardiology

## 2020-11-09 VITALS — BP 152/84 | HR 74 | Ht 70.0 in | Wt 202.0 lb

## 2020-11-09 DIAGNOSIS — I10 Essential (primary) hypertension: Secondary | ICD-10-CM | POA: Diagnosis not present

## 2020-11-09 DIAGNOSIS — I4819 Other persistent atrial fibrillation: Secondary | ICD-10-CM | POA: Diagnosis not present

## 2020-11-09 DIAGNOSIS — I484 Atypical atrial flutter: Secondary | ICD-10-CM

## 2020-11-09 DIAGNOSIS — R55 Syncope and collapse: Secondary | ICD-10-CM

## 2020-11-09 MED ORDER — DOFETILIDE 500 MCG PO CAPS: 500.0000 ug | ORAL_CAPSULE | Freq: Two times a day (BID) | ORAL | 2 refills | Status: DC

## 2020-11-09 MED ORDER — LOSARTAN POTASSIUM 100 MG PO TABS: 100.0000 mg | ORAL_TABLET | Freq: Every day | ORAL | 3 refills | Status: DC

## 2020-11-09 MED ORDER — AMLODIPINE BESYLATE 10 MG PO TABS: 10.0000 mg | ORAL_TABLET | Freq: Every day | ORAL | 3 refills | Status: DC

## 2020-11-09 NOTE — Patient Instructions (Addendum)
Medication Instructions:  Reduce your amlodipine to 10 mg daily Increase your Losartan to 100 mg daily Your physician recommends that you continue on your current medications as directed. Please refer to the Current Medication list given to you today.  Labwork: None ordered.  Testing/Procedures: None ordered.  Follow-Up: Your physician wants you to follow-up in: 05/03/21 at 2:30 am with Allegra Lai, MD    Any Other Special Instructions Will Be Listed Below (If Applicable).  If you need a refill on your cardiac medications before your next appointment, please call your pharmacy.

## 2020-11-09 NOTE — Progress Notes (Signed)
Electrophysiology Office Note   Date:  11/09/2020   ID:  Brent, Wilkins 02-22-1941, MRN [id number]  PCP:  Brent Mayo, NP  Cardiologist:  Brent Wilkins Primary Electrophysiologist:  Brent Wilkins Brent Leeds, MD    No chief complaint on file.    History of Present Illness: Brent Wilkins is a 80 y.o. male who is being seen today for the evaluation of atrial fibrillation at the request of Brent Mayo, NP. Presenting today for electrophysiology evaluation.    He has a history significant for hypertension, persistent atrial fibrillation, and hyperlipidemia.  He was hospitalized April 2020 with an episode of syncope.  He was found to be bradycardic and thus was not on AV nodal blockers.  The time of his hospitalization he was also found to be orthostatic.  On review of his ECGs he was noted to have intermittent episodes of sinus rhythm and thus he was admitted to the hospital for dofetilide loading.  He did well but had more frequent episodes of atrial fibrillation and is now status post ablation 10/06/2019.  Today, denies symptoms of palpitations, chest pain, shortness of breath, orthopnea, PND, lower extremity edema, claudication, dizziness, presyncope, syncope, bleeding, or neurologic sequela. The patient is tolerating medications without difficulties.  He is feeling well.  He has no chest pain or shortness of breath.  He is able do all of his daily activities.  He was put on 20 mg of amlodipine by his primary physician and feels quite fatigued.  Aside from that he has no complaints.  Past Medical History:  Diagnosis Date  . Allergy    hay fever;  seasonal  . Arthritis    right ankle  . Cataract    surgery 2016  . Chronic kidney disease    stones many years ago  . Constipation 12/09/2018  . Dizziness 12/13/2015  . Dysphagia   . Family history of malignant neoplasm of prostate 12/12/2017  . Frequent urination at night   . GERD (gastroesophageal reflux disease)   . History of  arthroplasty of right ankle 02/04/2020  . History of kidney stones   . History of prostate cancer 06/16/2017  . HTN (hypertension)   . Hyperlipidemia LDL goal <100 10/26/2019  . Hypertension    Phreesia 04/24/2020  . Hypothyroidism   . 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 Neuropathy    Bilateral ankles  . Paroxysmal atrial fibrillation (HCC)    a. on Tikosyn and Eliquis b. s/p ablation in 10/2019 due to break-through episodes while on Tikosyn.   . Polyneuropathy 06/16/2017  . 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 Prostate cancer [facility]) 2010   Dr. Rosana Hoes  . Schatzki's ring 10/22/2011  . Seasonal allergies   . Syncope  dx. 8 yrs ago  . Syncope, cardiogenic 03/20/2017  . Vaccine counseling 09/15/2019   Past Surgical History:  Procedure Laterality Date  . APPENDECTOMY    . ATRIAL FIBRILLATION ABLATION N/A 10/06/2019   Procedure: ATRIAL FIBRILLATION ABLATION;  Surgeon: Constance Haw, MD;  Location: [facility];  Service: Cardiovascular;  Laterality: N/A;  . CATARACT EXTRACTION W/PHACO Left 03/27/2015   Procedure: CATARACT EXTRACTION PHACO AND INTRAOCULAR LENS PLACEMENT LEFT EYE CDE=11.73;  Surgeon: Tonny Branch, MD;  Location: [facility];  Service: Ophthalmology;  Laterality: Left;  . CATARACT EXTRACTION W/PHACO Right 03/30/2015   Procedure: CATARACT EXTRACTION PHACO AND INTRAOCULAR LENS PLACEMENT RIGHT EYE CDE=7.35;  Surgeon: Tonny Branch, MD;  Location: [facility];  Service:  Ophthalmology;  Laterality: Right;  . CHOLECYSTECTOMY    . COLONOSCOPY  07/23/10   Dr. Vivi Ferns rectum, scattered pancolonic diverticula  . COLONOSCOPY  08/10/1999   internal hemorrhoids,inflammatory polyp  . ESOPHAGOGASTRODUODENOSCOPY  04/12/08   Prominent Schatzki's ring, erosive reflux esophagitis, multiple antral erosions, small hiatal hernia, reactive gastropathy, status post dilation with 43F  . ESOPHAGOGASTRODUODENOSCOPY  10/28/2011   Procedure: ESOPHAGOGASTRODUODENOSCOPY (EGD);  Surgeon: Daneil Dolin, MD;  Location: [facility];  Service: Endoscopy;  Laterality: N/A;  1:30  . ESOPHAGOGASTRODUODENOSCOPY N/A 09/10/2017   Dr. Gala Romney: Schatkzi's ring s/p dilation at GE junction, mild esophageal reflux esophagitis, medium sized hiatal hernia, normal duodenum. 56 and 68 Fr dilation  . EYE SURGERY    . JOINT REPLACEMENT N/A    Phreesia 01/15/2020  . MALONEY DILATION  10/28/2011   Procedure: Venia Minks DILATION;  Surgeon: Daneil Dolin, MD;  Location: [facility];  Service: Endoscopy;  Laterality: N/A;  . Venia Minks DILATION N/A 09/10/2017   Procedure: Venia Minks DILATION;  Surgeon: Daneil Dolin, MD;  Location: [facility];  Service: Endoscopy;  Laterality: N/A;  . SAVORY DILATION  10/28/2011   Procedure: SAVORY DILATION;  Surgeon: Daneil Dolin, MD;  Location: [facility];  Service: Endoscopy;  Laterality: N/A;  . TONSILLECTOMY    . TOTAL ANKLE ARTHROPLASTY Left 11/25/2013   Procedure: TOTAL ANKLE ARTHOPLASTY;  Surgeon: Wylene Simmer, MD;  Location: [facility];  Service: Orthopedics;  Laterality: Left;  . TOTAL ANKLE ARTHROPLASTY Right 07/14/2014   dr hewitt  . TOTAL ANKLE ARTHROPLASTY Right 07/14/2014   Procedure: RIGHT TOTAL ANKLE ARTHOPLASTY;  Surgeon: Wylene Simmer, MD;  Location: [facility];  Service: Orthopedics;  Laterality: Right;     Current Outpatient Medications  Medication Sig Dispense Refill  . amLODipine (NORVASC) 10 MG tablet Take 1 tablet (10 mg total) by mouth daily. 90 tablet 3  . Chlorphen-PE-Acetaminophen (NOREL AD) 4-10-325 MG TABS Take 1 tablet by mouth every 4 (four) hours as needed (nasal congestion, cold symptoms). 20 tablet 1  . diphenhydramine-acetaminophen (TYLENOL PM) 25-500 MG TABS tablet Take 1 tablet by mouth at bedtime as needed (sleep and mild discomfort).     . dofetilide (TIKOSYN) 500 MCG capsule Take 1 capsule (500 mcg total) by mouth 2 (two) times daily. 180 capsule 2  . ELIQUIS 5 MG TABS tablet TAKE 1 TABLET BY MOUTH  TWICE DAILY 180 tablet 1  . finasteride  (PROSCAR) 5 MG tablet Take 1 tablet by mouth daily.    . fluticasone (FLONASE) 50 MCG/ACT nasal spray USE 1 SPRAY IN BOTH  NOSTRILS DAILY AS NEEDED  FOR ALLERGIES 32 g 2  . gabapentin (NEURONTIN) 300 MG capsule Take 300 mg by mouth 2 (two) times daily.     Marland Kitchen ibuprofen (ADVIL,MOTRIN) 200 MG tablet Take 400 mg by mouth as needed for headache (headache/pain.).     Marland Kitchen levothyroxine (SYNTHROID) 75 MCG tablet TAKE 1 TABLET BY MOUTH  DAILY 90 tablet 3  . loratadine (CLARITIN) 10 MG tablet Take 10 mg by mouth daily as needed for allergies.    Marland Kitchen losartan (COZAAR) 50 MG tablet TAKE 1 TABLET BY MOUTH  DAILY 90 tablet 3  . montelukast (SINGULAIR) 10 MG tablet Take 1 tablet (10 mg total) by mouth at bedtime. 30 tablet 3  . Omega-3 Fatty Acids (RA FISH OIL) 1400 MG CPDR Take 1,400 mg by mouth daily.    . pantoprazole (PROTONIX) 40 MG tablet TAKE 1 TABLET BY MOUTH  DAILY BEFORE  BREAKFAST 90 tablet 3  . Polyethyl Glycol-Propyl Glycol 0.4-0.3 % SOLN Place 1 drop into both eyes 2 (two) times daily as needed (dry eyes).     . Tamsulosin HCl (FLOMAX) 0.4 MG CAPS Take 0.4 mg by mouth daily after breakfast.     . vitamin B-12 (CYANOCOBALAMIN) 1000 MCG tablet Take 1,000 mcg by mouth daily.     No current facility-administered medications for this visit.    Allergies:   Tetanus toxoids, Statins, Dicyclomine, Sulfamethoxazole, Doxycycline, Oxycodone, Penicillins, Sulfa antibiotics, and Xarelto [rivaroxaban]   Social History:  The patient  reports that he has never smoked. He has never used smokeless tobacco. He reports current alcohol use of about 2.0 standard drinks of alcohol per week. He reports that he does not use drugs.   Family History:  The patient's family history includes ALS in his son; Arthritis in his mother; Cancer in his father; Heart disease in his mother; Hypertension in his father; Prostate cancer in his brother and father; Prostate cancer (age of onset: 88) in his son.   ROS:  Please see the history  of present illness.   Otherwise, review of systems is positive for none.   All other systems are reviewed and negative.   PHYSICAL EXAM: VS:  There were no vitals taken for this visit. , BMI There is no height or weight on file to calculate BMI. GEN: Well nourished, well developed, in no acute distress  HEENT: normal  Neck: no JVD, carotid bruits, or masses Cardiac: RRR; no murmurs, rubs, or gallops,no edema  Respiratory:  clear to auscultation bilaterally, normal work of breathing GI: soft, nontender, nondistended, + BS MS: no deformity or atrophy  Skin: warm and dry Neuro:  Strength and sensation are intact Psych: euthymic mood, full affect  EKG:  EKG is ordered today. Personal review of the ekg ordered shows sinus rhythm, right bundle branch block  Recent Labs: 10/17/2020: ALT 6; BUN 17; Creatinine, Ser 1.34; Hemoglobin 13.0; Platelets 279; Potassium 4.8; Sodium 144; TSH 3.500    Lipid Panel     Component Value Date/Time   CHOL 158 10/17/2020 0827   TRIG 172 (H) 10/17/2020 0827   HDL 45 10/17/2020 0827   CHOLHDL 4.7 04/28/2020 0834   CHOLHDL 3.6 01/28/2020 0952   VLDL 30 09/22/2017 1444   LDLCALC 84 10/17/2020 0827   LDLCALC 104 (H) 01/28/2020 0952     Wt Readings from Last 3 Encounters:  10/16/20 200 lb (90.7 kg)  08/17/20 197 lb 12.8 oz (89.7 kg)  08/03/20 198 lb (89.8 kg)      Other studies Reviewed: Additional studies/ records that were reviewed today include: 30 day monitor 04/28/17 - personally reviewed  Review of the above records today demonstrates:   Atrial fibrillation and flutter seen throughout study.  Multiple pauses up to 3 seconds with most occurring during early morning hours (presumably asleep) and one occurring at approximately 1 pm.  TTE 03/21/17 - Left ventricle: The cavity size was normal. Wall thickness was   increased in a pattern of moderate LVH. Systolic function was   normal. The estimated ejection fraction was in the range of 60%   to  65%. Wall motion was normal; there were no regional wall   motion abnormalities. Doppler parameters are consistent with   restrictive physiology, indicative of decreased left ventricular   diastolic compliance and/or increased left atrial pressure.   Doppler parameters are consistent with high ventricular filling   pressure. - Aortic valve: Moderately calcified  annulus. Trileaflet. - Mitral valve: Mildly calcified annulus. - Left atrium: The atrium was severely dilated. - Right ventricle: Systolic function was mildly reduced. - Right atrium: The atrium was mildly to moderately dilated. - Tricuspid valve: There was mild-moderate regurgitation. - Pulmonary arteries: Incomplete TR jet to accurately estimated   PASP.  ASSESSMENT AND PLAN:  1.  Persistent atrial fibrillation/flutter: Currently on Eliquis and dofetilide.  High risk medication monitoring.  CHA2DS2-VASc of 3.  Status post AF ablation 10/06/2019.  Remains in sinus rhythm with occasional PVCs.  No changes.    2.  Syncope: Greatly improved since his ablation.     3.  Hypertension: Elevated today.  Val Farnam reduce amlodipine to 10 mg, and increase losartan to 100 mg.  Current medicines are reviewed at length with the patient today.   The patient does not have concerns regarding his medicines.  The following changes were made today: none  Labs/ tests ordered today include:  No orders of the defined types were placed in this encounter.    Disposition:   FU with Malaia Buchta 6 months  Signed, Elina Streng Brent Leeds, MD  11/09/2020 4:00 PM     [address] [place] [facility] [place] [address] [phone] (office) [phone] (fax)

## 2020-11-16 ENCOUNTER — Ambulatory Visit (INDEPENDENT_AMBULATORY_CARE_PROVIDER_SITE_OTHER): Payer: Medicare Other | Admitting: Nurse Practitioner

## 2020-11-16 ENCOUNTER — Other Ambulatory Visit: Payer: Self-pay

## 2020-11-16 ENCOUNTER — Encounter: Payer: Self-pay | Admitting: Nurse Practitioner

## 2020-11-16 VITALS — BP 130/88 | HR 76 | Temp 97.6°F | Resp 20 | Ht 70.0 in | Wt 200.0 lb

## 2020-11-16 DIAGNOSIS — I4891 Unspecified atrial fibrillation: Secondary | ICD-10-CM

## 2020-11-16 DIAGNOSIS — E538 Deficiency of other specified B group vitamins: Secondary | ICD-10-CM | POA: Diagnosis not present

## 2020-11-16 DIAGNOSIS — I10 Essential (primary) hypertension: Secondary | ICD-10-CM | POA: Diagnosis not present

## 2020-11-16 MED ORDER — AMLODIPINE BESYLATE 5 MG PO TABS: 5.0000 mg | ORAL_TABLET | Freq: Every day | ORAL | 3 refills | Status: DC

## 2020-11-16 NOTE — Patient Instructions (Signed)
Please have fasting labs drawn 2-3 days prior to your appointment so we can discuss the results during your office visit.  

## 2020-11-16 NOTE — Progress Notes (Signed)
Acute Office Visit  Subjective:    Patient ID: Brent Wilkins, male    DOB: [birth year], 80 y.o.   MRN: [id number]  Chief Complaint  Patient presents with  . Hypertension    HPI Patient is in today for BP check. At his last visit, his BP was 177/94, so we increased his amlodipine to 10 mg (from 5). Cardiology increased his losartan to 100 mg daily. He states that he was feeling tired when taking amlodipine 10 mg, but his BP has been much improved with the losartan.  Past Medical History:  Diagnosis Date  . Allergy    hay fever;  seasonal  . Arthritis    right ankle  . Cataract    surgery 2016  . Chronic kidney disease    stones many years ago  . Constipation 12/09/2018  . Dizziness 12/13/2015  . Dysphagia   . Family history of malignant neoplasm of prostate 12/12/2017  . Frequent urination at night   . GERD (gastroesophageal reflux disease)   . History of arthroplasty of right ankle 02/04/2020  . History of kidney stones   . History of prostate cancer 06/16/2017  . HTN (hypertension)   . Hyperlipidemia LDL goal <100 10/26/2019  . Hypertension    Phreesia 04/24/2020  . Hypothyroidism   . 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 Neuropathy    Bilateral ankles  . Paroxysmal atrial fibrillation (HCC)    a. on Tikosyn and Eliquis b. s/p ablation in 10/2019 due to break-through episodes while on Tikosyn.   . Polyneuropathy 06/16/2017  . 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 Prostate cancer [facility]) 2010   Dr. Rosana Hoes  . Schatzki's ring 10/22/2011  . Seasonal allergies   . Syncope  dx. 8 yrs ago  . Syncope, cardiogenic 03/20/2017  . Vaccine counseling 09/15/2019    Past Surgical History:  Procedure Laterality Date  . APPENDECTOMY    . ATRIAL FIBRILLATION ABLATION N/A 10/06/2019   Procedure: ATRIAL FIBRILLATION ABLATION;  Surgeon: Constance Haw, MD;  Location: [place] [facility];  Service: Cardiovascular;  Laterality: N/A;  . CATARACT EXTRACTION W/PHACO Left 03/27/2015    Procedure: CATARACT EXTRACTION PHACO AND INTRAOCULAR LENS PLACEMENT LEFT EYE CDE=11.73;  Surgeon: Tonny Branch, MD;  Location: [facility];  Service: Ophthalmology;  Laterality: Left;  . CATARACT EXTRACTION W/PHACO Right 03/30/2015   Procedure: CATARACT EXTRACTION PHACO AND INTRAOCULAR LENS PLACEMENT RIGHT EYE CDE=7.35;  Surgeon: Tonny Branch, MD;  Location: [facility];  Service: Ophthalmology;  Laterality: Right;  . CHOLECYSTECTOMY    . COLONOSCOPY  07/23/10   Dr. Vivi Ferns rectum, scattered pancolonic diverticula  . COLONOSCOPY  08/10/1999   internal hemorrhoids,inflammatory polyp  . ESOPHAGOGASTRODUODENOSCOPY  04/12/08   Prominent Schatzki's ring, erosive reflux esophagitis, multiple antral erosions, small hiatal hernia, reactive gastropathy, status post dilation with 50F  . ESOPHAGOGASTRODUODENOSCOPY  10/28/2011   Procedure: ESOPHAGOGASTRODUODENOSCOPY (EGD);  Surgeon: Daneil Dolin, MD;  Location: [facility];  Service: Endoscopy;  Laterality: N/A;  1:30  . ESOPHAGOGASTRODUODENOSCOPY N/A 09/10/2017   Dr. Gala Romney: Schatkzi's ring s/p dilation at GE junction, mild esophageal reflux esophagitis, medium sized hiatal hernia, normal duodenum. 56 and 64 Fr dilation  . EYE SURGERY    . JOINT REPLACEMENT N/A    Phreesia 01/15/2020  . MALONEY DILATION  10/28/2011   Procedure: Venia Minks DILATION;  Surgeon: Daneil Dolin, MD;  [facility]: [facility];  Service: Endoscopy;  Laterality: N/A;  . MALONEY DILATION N/A 09/10/2017  Procedure: MALONEY DILATION;  Surgeon: Daneil Dolin, MD;  [facility];  Service: Endoscopy;  Laterality: N/A;  . SAVORY DILATION  10/28/2011   Procedure: SAVORY DILATION;  Surgeon: Daneil Dolin, MD;  [facility]: [facility];  Service: Endoscopy;  Laterality: N/A;  . TONSILLECTOMY    . TOTAL ANKLE ARTHROPLASTY Left 11/25/2013   Procedure: TOTAL ANKLE ARTHOPLASTY;  Surgeon: Wylene Simmer, MD;  Location: [place];  Service: Orthopedics;  Laterality: Left;  . TOTAL ANKLE ARTHROPLASTY  Right 07/14/2014   dr hewitt  . TOTAL ANKLE ARTHROPLASTY Right 07/14/2014   Procedure: RIGHT TOTAL ANKLE ARTHOPLASTY;  Surgeon: Wylene Simmer, MD;  Location: [facility];  Service: Orthopedics;  Laterality: Right;    Family History  Problem Relation Age of Onset  . Prostate cancer Father   . Cancer Father        prostate to bone  . Hypertension Father   . Prostate cancer Son 3  . ALS Son   . Prostate cancer Brother   . Heart disease Mother        died at 49  . Arthritis Mother   . Colon cancer Neg Hx     Social History   Socioeconomic History  . Marital status: Married    Spouse name: Candace  . Number of children: 2  . Years of education: Not on file  . Highest education level: Bachelor's degree (e.g., BA, AB, BS)  Occupational History  . Occupation: retired; Ambulance person: RETIRED  Tobacco Use  . Smoking status: Never Smoker  . Smokeless tobacco: Never Used  Vaping Use  . Vaping Use: Never used  Substance and Sexual Activity  . Alcohol use: Yes    Alcohol/week: 2.0 standard drinks    Types: 1 Glasses of wine, 1 Cans of beer per week    Comment: glass of wine or "couple of beers" occasionally   . Drug use: No  . Sexual activity: Yes    Partners: Female  Other Topics Concern  . Not on file  Social History Narrative   Lives with wife Candace.       Moved from [place], [place]   Retired from [employer].   Plays golf.   Works with [employer] and [employer] for [employer].   Wears seatbelt.   Drives.   Eats all food groups.   Never smoked.   Married for over 27 years June  (2020)   Has two grown children, live in [place] and [place].   Social Determinants of Health   Financial Resource Strain: Low Risk   . Difficulty of Paying Living Expenses: Not hard at all  Food Insecurity: No Food Insecurity  . Worried About Charity fundraiser in the Last Year: Never true  . Ran Out of Food in the Last Year: Never true  Transportation Needs: No Transportation  Needs  . Lack of Transportation (Medical): No  . Lack of Transportation (Non-Medical): No  Physical Activity: Sufficiently Active  . Days of Exercise per Week: 7 days  . Minutes of Exercise per Session: 60 min  Stress: No Stress Concern Present  . Feeling of Stress : Not at all  Social Connections: Socially Integrated  . Frequency of Communication with Friends and Family: More than three times a week  . Frequency of Social Gatherings with Friends and Family: More than three times a week  . Attends Religious Services: More than 4 times per year  . Active Member of  Clubs or Organizations: Yes  . Attends Archivist Meetings: More than 4 times per year  . Marital Status: Married  Human resources officer Violence: Not on file    Outpatient Medications Prior to Visit  Medication Sig Dispense Refill  . diphenhydramine-acetaminophen (TYLENOL PM) 25-500 MG TABS tablet Take 1 tablet by mouth at bedtime as needed (sleep and mild discomfort).     . dofetilide (TIKOSYN) 500 MCG capsule Take 1 capsule (500 mcg total) by mouth 2 (two) times daily. 180 capsule 2  . ELIQUIS 5 MG TABS tablet TAKE 1 TABLET BY MOUTH  TWICE DAILY 180 tablet 1  . finasteride (PROSCAR) 5 MG tablet Take 1 tablet by mouth daily.    . fluticasone (FLONASE) 50 MCG/ACT nasal spray USE 1 SPRAY IN BOTH  NOSTRILS DAILY AS NEEDED  FOR ALLERGIES 32 g 2  . gabapentin (NEURONTIN) 300 MG capsule Take 300 mg by mouth 2 (two) times daily.     Marland Kitchen ibuprofen (ADVIL,MOTRIN) 200 MG tablet Take 400 mg by mouth as needed for headache (headache/pain.).     Marland Kitchen levothyroxine (SYNTHROID) 75 MCG tablet TAKE 1 TABLET BY MOUTH  DAILY 90 tablet 3  . loratadine (CLARITIN) 10 MG tablet Take 10 mg by mouth daily as needed for allergies.    Marland Kitchen losartan (COZAAR) 100 MG tablet Take 1 tablet (100 mg total) by mouth daily. 90 tablet 3  . montelukast (SINGULAIR) 10 MG tablet Take 1 tablet (10 mg total) by mouth at bedtime. 30 tablet 3  . Omega-3 Fatty Acids (RA  FISH OIL) 1400 MG CPDR Take 1,400 mg by mouth daily.    . pantoprazole (PROTONIX) 40 MG tablet TAKE 1 TABLET BY MOUTH  DAILY BEFORE BREAKFAST 90 tablet 3  . Polyethyl Glycol-Propyl Glycol 0.4-0.3 % SOLN Place 1 drop into both eyes 2 (two) times daily as needed (dry eyes).     . Tamsulosin HCl (FLOMAX) 0.4 MG CAPS Take 0.4 mg by mouth daily after breakfast.     . vitamin B-12 (CYANOCOBALAMIN) 1000 MCG tablet Take 1,000 mcg by mouth daily.    Marland Kitchen amLODipine (NORVASC) 10 MG tablet Take 1 tablet (10 mg total) by mouth daily. 180 tablet 3  . Chlorphen-PE-Acetaminophen (NOREL AD) 4-10-325 MG TABS Take 1 tablet by mouth every 4 (four) hours as needed (nasal congestion, cold symptoms). 20 tablet 1   No facility-administered medications prior to visit.    Allergies  Allergen Reactions  . Tetanus Toxoids Swelling    Face swelling  . Statins Other (See Comments)    Hard to walk, severe leg cramps Other reaction(s): Other (See Comments) Muscle weakness "unable to walk"  . Dicyclomine Other (See Comments)    constipation  . Sulfamethoxazole     Other reaction(s): Other (See Comments) unknown  . Doxycycline Rash    Rash only  . Oxycodone Other (See Comments)    Severe constipation  . Penicillins Swelling and Rash    Took as a child, developed rash over time Has patient had a PCN reaction causing immediate rash, facial/tongue/throat swelling, SOB or lightheadedness with hypotension: Yes Has patient had a PCN reaction causing severe rash involving mucus membranes or skin necrosis: Unknown Has patient had a PCN reaction that required hospitalization: No Has patient had a PCN reaction occurring within the last 10 years: No If all of the above answers are "NO", then may proceed with Cephalosporin use.  Other reaction(s): Other (See Comments) unknown  . Sulfa Antibiotics Swelling and Rash  .  Xarelto [Rivaroxaban] Rash    rash    Review of Systems  Constitutional: Negative.   Respiratory:  Negative.   Cardiovascular: Negative.   Psychiatric/Behavioral: Negative.        Objective:    Physical Exam Constitutional:      Appearance: Normal appearance.  Cardiovascular:     Rate and Rhythm: Normal rate. Rhythm irregular.     Pulses: Normal pulses.     Heart sounds: Normal heart sounds.  Pulmonary:     Breath sounds: Normal breath sounds.  Neurological:     Mental Status: He is alert.  Psychiatric:        Mood and Affect: Mood normal.        Behavior: Behavior normal.        Thought Content: Thought content normal.        Judgment: Judgment normal.     BP 130/88   Pulse 76   Temp 97.6 F (36.4 C)   Resp 20   Ht $R'5\' 10"'Fj$  (1.778 m)   Wt 200 lb (90.7 kg)   SpO2 97%   BMI 28.70 kg/m  Wt Readings from Last 3 Encounters:  11/16/20 200 lb (90.7 kg)  11/09/20 202 lb (91.6 kg)  10/16/20 200 lb (90.7 kg)    There are no preventive care reminders to display for this patient.  There are no preventive care reminders to display for this patient.   Lab Results  Component Value Date   TSH 3.500 10/17/2020   Lab Results  Component Value Date   WBC 6.9 10/17/2020   HGB 13.0 10/17/2020   HCT 40.2 10/17/2020   MCV 87 10/17/2020   PLT 279 10/17/2020   Lab Results  Component Value Date   NA 144 10/17/2020   K 4.8 10/17/2020   CO2 22 10/17/2020   GLUCOSE 101 (H) 10/17/2020   BUN 17 10/17/2020   CREATININE 1.34 (H) 10/17/2020   BILITOT 0.5 10/17/2020   ALKPHOS 77 10/17/2020   AST 17 10/17/2020   ALT 6 10/17/2020   PROT 6.9 10/17/2020   ALBUMIN 4.1 10/17/2020   CALCIUM 9.4 10/17/2020   ANIONGAP 11 07/07/2020   Lab Results  Component Value Date   CHOL 158 10/17/2020   Lab Results  Component Value Date   HDL 45 10/17/2020   Lab Results  Component Value Date   LDLCALC 84 10/17/2020   Lab Results  Component Value Date   TRIG 172 (H) 10/17/2020   Lab Results  Component Value Date   CHOLHDL 4.7 04/28/2020   Lab Results  Component Value Date    HGBA1C 5.5 04/28/2020       Assessment & Plan:   Problem List Items Addressed This Visit      Cardiovascular and Mediastinum   Atrial fibrillation (HCC)    -BP is great today -continue losartan 100 mg -there was some confusion with his amlodipine dosing; a this last OV with [place], we increased his amlodipine form 5 to 10, and he was taking 2 x 5 mg tabs per day; he told cardiology that he was taking 2 tabs per day, so they thought he was taking 20 mg per day, and they cut him down to 10 mg -However, he has only been taking 5 mg per day after cardiology cut his amlodipine in half, because he never took 20 mg per day. He was only taking 10 mg, and that caused fatigue -Rx. Amlodipine 5 mg daily (DECREASE from 10 mg daily)  Relevant Medications   amLODipine (NORVASC) 5 MG tablet   Other Relevant Orders   CBC with Differential/Platelet   CMP14+EGFR   Lipid Panel With LDL/HDL Ratio   HTN, goal below 140/90   Relevant Medications   amLODipine (NORVASC) 5 MG tablet   Other Relevant Orders   CBC with Differential/Platelet   CMP14+EGFR    Other Visit Diagnoses    B12 deficiency    -  Primary   Relevant Orders   B12       Meds ordered this encounter  Medications  . amLODipine (NORVASC) 5 MG tablet    Sig: Take 1 tablet (5 mg total) by mouth daily.    Dispense:  90 tablet    Refill:  3    Decreased dose today     Noreene Larsson, NP

## 2020-11-16 NOTE — Assessment & Plan Note (Signed)
-  BP is great today -continue losartan 100 mg -there was some confusion with his amlodipine dosing; a this last OV with [place], we increased his amlodipine form 5 to 10, and he was taking 2 x 5 mg tabs per day; he told cardiology that he was taking 2 tabs per day, so they thought he was taking 20 mg per day, and they cut him down to 10 mg -However, he has only been taking 5 mg per day after cardiology cut his amlodipine in half, because he never took 20 mg per day. He was only taking 10 mg, and that caused fatigue -Rx. Amlodipine 5 mg daily (DECREASE from 10 mg daily)

## 2020-12-20 ENCOUNTER — Ambulatory Visit: Payer: Medicare Other | Admitting: Neurology

## 2020-12-20 ENCOUNTER — Encounter: Payer: Self-pay | Admitting: Neurology

## 2020-12-20 VITALS — BP 126/82 | HR 76 | Ht 71.0 in | Wt 200.0 lb

## 2020-12-20 DIAGNOSIS — R251 Tremor, unspecified: Secondary | ICD-10-CM

## 2020-12-20 NOTE — Patient Instructions (Signed)
It was nice to meet you both today.   You have a fairly symmetrical tremor of both hands.  I do not see any signs or symptoms of parkinson's like disease or what we call parkinsonism.   For your tremor, I would not recommend any new medication for fear of side effects: A beta-blocker may not be a good choice for you because your heart rate can be in the 60s and you have had syncope before.  Beta-blocker type medications can cause low heart rate, low blood pressure, and dizziness/lightheadedness.  Another medication we consider for tremor control is called Mysoline (primidone is the generic name) but this can affect your balance and cause sleepiness and affect kidney function.   Please remember, that any kind of tremor may be exacerbated by anxiety, anger, nervousness, excitement, dehydration, sleep deprivation, by caffeine, and low blood sugar values or blood sugar fluctuations. Some medications can exacerbate tremors, this includes often antidepressant type medications.  On your list, I do not see any obvious medication that would cause or exacerbate tremors.  Please limit your caffeine intake to up to 1 serving per day and limit your alcohol intake to up to 1 serving per day.  Try to hydrate well with water, 6 to 8 cups/day are recommended, 8 ounce size each, generally speaking.  I would be happy to see you back on an as-needed basis.

## 2020-12-20 NOTE — Progress Notes (Signed)
Subjective:    Patient ID: GARREY SIGLIN is a 80 y.o. male.  HPI     Star Age, MD, PhD [facility] [address] [place], [place] [address]  Dear Broadus John,   I saw your patient, Daelan Himmelreich, upon your kind request in my neurologic clinic today for initial consultation of his tremors.  The patient is accompanied by his wife today.  As you know, Mr. 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 fib, allergies, history of syncope, and overweight state, who reports a longstanding history of bilateral hand tremors of over 20 or 25 years duration.  The tremor has progressed over time, particularly in the past few years.  He has not noticed any tremor in the lower extremities, has never tried any symptomatic medication for it.  He had seen Dr. Merlene Laughter in the past for neuropathy.   He is not aware of any family history of tremors or Parkinson's disease.  He tries to hydrate well with water but could do better by self-report and per wife.  He drinks alcohol, 1 glass of wine per day on average, occasional beer.  He does not drink caffeine on a day-to-day basis.   Tremor is noticeable when he holds something on when he feeds himself.  It is a nuisance for the most part. He sleeps fairly well but does report taking a as needed medication over-the-counter on a nightly basis. I reviewed your office note from 10/16/2020.  He is followed closely with cardiology.  He is status post A. fib ablation in March 2021.  He has been on Eliquis.  I reviewed recent lab test results in his chart.  His TSH and free T4 were normal on 10/17/2020.  CMP showed glucose of 101, creatinine 1.34, BUN 17, AST 17,  ALT 6, CBC with differential was unremarkable, lipid panel showed triglycerides of 172, total cholesterol 158, HDL 45, LDL 84.  He is supposed to get additional labs done including vitamin B12, lipid panel, CMP, CBC with differential. Prior neurology records are not available for my review today.  He reports having had an EMG and nerve conduction velocity test done.  Symptoms of neuropathy effect of feet at the bottom.  He has not had any recent falls.  His Past Medical History Is Significant For: Past Medical History:  Diagnosis Date  . Allergy    hay fever;  seasonal  . Arthritis    right ankle  . Cataract    surgery 2016  . Chronic kidney disease    stones many years ago  . Constipation 12/09/2018  . Dizziness 12/13/2015  . Dysphagia   . Family history of malignant neoplasm of prostate 12/12/2017  . Frequent urination at night   . GERD (gastroesophageal reflux disease)   . History of arthroplasty of right ankle 02/04/2020  . History of kidney stones   . History of prostate cancer 06/16/2017  . HTN (hypertension)   . Hyperlipidemia LDL goal <100 10/26/2019  . Hypertension    Phreesia 04/24/2020  . Hypothyroidism   . 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 Neuropathy    Bilateral ankles  . Paroxysmal atrial fibrillation (HCC)    a. on Tikosyn and Eliquis b. s/p ablation in 10/2019 due to break-through episodes while  on Tikosyn.   . Polyneuropathy 06/16/2017  . 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 Prostate cancer [facility]) 2010   Dr. Rosana Hoes  . Schatzki's ring 10/22/2011  . Seasonal allergies   . Syncope  dx. 8 yrs ago  . Syncope, cardiogenic 03/20/2017  . Vaccine counseling 09/15/2019    His Past Surgical History Is Significant For: Past Surgical History:  Procedure Laterality Date  . APPENDECTOMY    . ATRIAL FIBRILLATION ABLATION N/A 10/06/2019   Procedure: ATRIAL FIBRILLATION ABLATION;  Surgeon: Constance Haw, MD;  Location: [facility];  Service: Cardiovascular;   Laterality: N/A;  . CATARACT EXTRACTION W/PHACO Left 03/27/2015   Procedure: CATARACT EXTRACTION PHACO AND INTRAOCULAR LENS PLACEMENT LEFT EYE CDE=11.73;  Surgeon: Tonny Branch, MD;  Location: [facility];  Service: Ophthalmology;  Laterality: Left;  . CATARACT EXTRACTION W/PHACO Right 03/30/2015   Procedure: CATARACT EXTRACTION PHACO AND INTRAOCULAR LENS PLACEMENT RIGHT EYE CDE=7.35;  Surgeon: Tonny Branch, MD;  Location: [facility];  Service: Ophthalmology;  Laterality: Right;  . CHOLECYSTECTOMY    . COLONOSCOPY  07/23/10   Dr. Vivi Ferns rectum, scattered pancolonic diverticula  . COLONOSCOPY  08/10/1999   internal hemorrhoids,inflammatory polyp  . ESOPHAGOGASTRODUODENOSCOPY  04/12/08   Prominent Schatzki's ring, erosive reflux esophagitis, multiple antral erosions, small hiatal hernia, reactive gastropathy, status post dilation with 44F  . ESOPHAGOGASTRODUODENOSCOPY  10/28/2011   Procedure: ESOPHAGOGASTRODUODENOSCOPY (EGD);  Surgeon: Daneil Dolin, MD;  Location: [facility];  Service: Endoscopy;  Laterality: N/A;  1:30  . ESOPHAGOGASTRODUODENOSCOPY N/A 09/10/2017   Dr. Gala Romney: Schatkzi's ring s/p dilation at GE junction, mild esophageal reflux esophagitis, medium sized hiatal hernia, normal duodenum. 56 and 82 Fr dilation  . EYE SURGERY    . JOINT REPLACEMENT N/A    Phreesia 01/15/2020  . MALONEY DILATION  10/28/2011   Procedure: Venia Minks DILATION;  Surgeon: Daneil Dolin, MD;  [facility]: [facility];  Service: Endoscopy;  Laterality: N/A;  . Venia Minks DILATION N/A 09/10/2017   Procedure: Venia Minks DILATION;  Surgeon: Daneil Dolin, MD;  [facility];  Service: Endoscopy;  Laterality: N/A;  . SAVORY DILATION  10/28/2011   Procedure: SAVORY DILATION;  Surgeon: Daneil Dolin, MD;  [facility]: [facility];  Service: Endoscopy;  Laterality: N/A;  . TONSILLECTOMY    . TOTAL ANKLE ARTHROPLASTY Left 11/25/2013   Procedure: TOTAL ANKLE ARTHOPLASTY;  Surgeon: Wylene Simmer, MD;  Location: [facility];  Service:  Orthopedics;  Laterality: Left;  . TOTAL ANKLE ARTHROPLASTY Right 07/14/2014   dr hewitt  . TOTAL ANKLE ARTHROPLASTY Right 07/14/2014   Procedure: RIGHT TOTAL ANKLE ARTHOPLASTY;  Surgeon: Wylene Simmer, MD;  Location: [facility];  Service: Orthopedics;  Laterality: Right;    His Family History Is Significant For: Family History  Problem Relation Age of Onset  . Prostate cancer Father   . Cancer Father        prostate to bone  . Hypertension Father   . Prostate cancer Son 23  . ALS Son   . Prostate cancer Brother   . Heart disease Mother        died at 38  . Arthritis Mother   . Colon cancer Neg Hx     His Social History Is Significant For: Social History   Socioeconomic History  . Marital status: Married    Spouse name: Candace  . Number of children: 2  . Years of education: Not on file  . Highest education level: Bachelor's degree (e.g., BA, AB,  BS)  Occupational History  . Occupation: retired; Ambulance person: RETIRED  Tobacco Use  . Smoking status: Never Smoker  . Smokeless tobacco: Never Used  Vaping Use  . Vaping Use: Never used  Substance and Sexual Activity  . Alcohol use: Yes    Alcohol/week: 2.0 standard drinks    Types: 1 Glasses of wine, 1 Cans of beer per week    Comment: glass of wine or "couple of beers" occasionally   . Drug use: No  . Sexual activity: Yes    Partners: Female  Other Topics Concern  . Not on file  Social History Narrative   Lives with wife Candace.       Moved from [place], [place]   Retired from [employer].   Plays golf.   Works with [employer] and [employer] for [employer].   Wears seatbelt.   Drives.   Eats all food groups.   Never smoked.   Married for over 67 years June  (2020)   Has two grown children, live in [place] and [place].   Social Determinants of Health   Financial Resource Strain: Low Risk   . Difficulty of Paying Living Expenses: Not hard at all  Food Insecurity: No Food Insecurity  . Worried About  Charity fundraiser in the Last Year: Never true  . Ran Out of Food in the Last Year: Never true  Transportation Needs: No Transportation Needs  . Lack of Transportation (Medical): No  . Lack of Transportation (Non-Medical): No  Physical Activity: Sufficiently Active  . Days of Exercise per Week: 7 days  . Minutes of Exercise per Session: 60 min  Stress: No Stress Concern Present  . Feeling of Stress : Not at all  Social Connections: Socially Integrated  . Frequency of Communication with Friends and Family: More than three times a week  . Frequency of Social Gatherings with Friends and Family: More than three times a week  . Attends Religious Services: More than 4 times per year  . Active Member of Clubs or Organizations: Yes  . Attends Archivist Meetings: More than 4 times per year  . Marital Status: Married    His Allergies Are:  Allergies  Allergen Reactions  . Tetanus Toxoids Swelling    Face swelling  . Statins Other (See Comments)    Hard to walk, severe leg cramps Other reaction(s): Other (See Comments) Muscle weakness "unable to walk"  . Dicyclomine Other (See Comments)    constipation  . Sulfamethoxazole     Other reaction(s): Other (See Comments) unknown  . Doxycycline Rash    Rash only  . Oxycodone Other (See Comments)    Severe constipation  . Penicillins Swelling and Rash    Took as a child, developed rash over time Has patient had a PCN reaction causing immediate rash, facial/tongue/throat swelling, SOB or lightheadedness with hypotension: Yes Has patient had a PCN reaction causing severe rash involving mucus membranes or skin necrosis: Unknown Has patient had a PCN reaction that required hospitalization: No Has patient had a PCN reaction occurring within the last 10 years: No If all of the above answers are "NO", then may proceed with Cephalosporin use.  Other reaction(s): Other (See Comments) unknown  . Sulfa Antibiotics Swelling and Rash  .  Xarelto [Rivaroxaban] Rash    rash  :   His Current Medications Are:  Outpatient Encounter Medications as of 12/20/2020  Medication Sig  . amLODipine (NORVASC) 5 MG  tablet Take 1 tablet (5 mg total) by mouth daily.  . diphenhydramine-acetaminophen (TYLENOL PM) 25-500 MG TABS tablet Take 1 tablet by mouth at bedtime as needed (sleep and mild discomfort).   . dofetilide (TIKOSYN) 500 MCG capsule Take 1 capsule (500 mcg total) by mouth 2 (two) times daily.  Marland Kitchen ELIQUIS 5 MG TABS tablet TAKE 1 TABLET BY MOUTH  TWICE DAILY  . finasteride (PROSCAR) 5 MG tablet Take 1 tablet by mouth daily.  . fluticasone (FLONASE) 50 MCG/ACT nasal spray USE 1 SPRAY IN BOTH  NOSTRILS DAILY AS NEEDED  FOR ALLERGIES  . gabapentin (NEURONTIN) 300 MG capsule Take 300 mg by mouth 2 (two) times daily.   Marland Kitchen ibuprofen (ADVIL,MOTRIN) 200 MG tablet Take 400 mg by mouth as needed for headache (headache/pain.).   Marland Kitchen levothyroxine (SYNTHROID) 75 MCG tablet TAKE 1 TABLET BY MOUTH  DAILY  . loratadine (CLARITIN) 10 MG tablet Take 10 mg by mouth daily as needed for allergies.  Marland Kitchen losartan (COZAAR) 100 MG tablet Take 1 tablet (100 mg total) by mouth daily.  . montelukast (SINGULAIR) 10 MG tablet Take 1 tablet (10 mg total) by mouth at bedtime.  . Omega-3 Fatty Acids (RA FISH OIL) 1400 MG CPDR Take 1,400 mg by mouth daily.  . pantoprazole (PROTONIX) 40 MG tablet TAKE 1 TABLET BY MOUTH  DAILY BEFORE BREAKFAST  . Polyethyl Glycol-Propyl Glycol 0.4-0.3 % SOLN Place 1 drop into both eyes 2 (two) times daily as needed (dry eyes).   . Tamsulosin HCl (FLOMAX) 0.4 MG CAPS Take 0.4 mg by mouth daily after breakfast.   . vitamin B-12 (CYANOCOBALAMIN) 1000 MCG tablet Take 1,000 mcg by mouth daily.   No facility-administered encounter medications on file as of 12/20/2020.  :   Review of Systems:  Out of a complete 14 point review of systems, all are reviewed and negative with the exception of these symptoms as listed below: Review of Systems   Neurological:       Here to discuss worsening bilateral hand termor's. Reports he is right handed but sx are equal in both left and right hand. Reports when he is busy and has a lot to do sx can increase.    Objective:  Neurological Exam  Physical Exam  Physical Examination:   Vitals:   12/20/20 0938  BP: 126/82  Pulse: 76  SpO2: 98%    General Examination: The patient is a very pleasant 80 y.o. male in no acute distress. He appears well-developed and well-nourished and well groomed.   HEENT: Normocephalic, atraumatic, pupils are equal, round and reactive to light, extraocular tracking is well-preserved, no nystagmus.  Hearing is grossly intact with bilateral hearing aids in place.  Speech is clear without dysarthria, hypophonia or voice tremor.  Neck is supple with full range of passive and active motion, no carotid bruits.  Airway examination reveals mild mouth dryness, tongue protrudes centrally and palate elevates symmetrically, no lip, neck or jaw tremor.   Chest: Clear to auscultation without wheezing, rhonchi or crackles noted.  Heart: S1+S2+0, regular and normal without murmurs, rubs or gallops noted.  Possible intermittent slight systolic murmur noted.  Abdomen: Soft, non-tender and non-distended with normal bowel sounds appreciated on auscultation.  Extremities: There is no pitting edema in the distal lower extremities bilaterally. Pedal pulses are intact.  Skin: Warm and dry without trophic changes noted.  Musculoskeletal: exam reveals no obvious joint deformities, tenderness or joint swelling but decreased mobility in both ankles, had bilateral ankle surgeries.  Neurologically:  Mental status: The patient is awake, alert and oriented in all 4 spheres. His immediate and remote memory, attention, language skills and fund of knowledge are appropriate. There is no evidence of aphasia, agnosia, apraxia or anomia. Speech is clear with normal prosody and enunciation. Thought  process is linear. Mood is normal and affect is normal.  Cranial nerves II - XII are as described above under HEENT exam. In addition: shoulder shrug is normal with equal shoulder height noted. Motor exam: Normal bulk, strength and tone is noted. There is no drift, tremor or rebound. Romberg is showing mild sway.   On 12/20/2020: Archimedes spiral drawing shows a mild tremor with the right hand which is his dominant hand, mild to moderate trembling with the left hand, handwriting is legible, mildly tremulous, not micrographic.  Has a bilateral upper extremity mild to perhaps moderate postural tremor, mild action tremor, no significant intention tremor, slight and intermittent resting component in the right more than left hand.  Reflexes are 2+ throughout, absent in both ankles. Babinski: Toes are flexor bilaterally. Fine motor skills and coordination: intact with normal finger taps, normal hand movements, normal rapid alternating patting, but slight difficulty with foot taps.   Cerebellar testing: No dysmetria or intention tremor on finger to nose testing. Heel to shin is unremarkable bilaterally. There is no truncal or gait ataxia.  Sensory exam: intact to light touch, vibration, temperature sense in the upper extremities bilaterally, some decrease in vibration sense in the left more than right ankle.    Gait, station and balance: He stands easily. No veering to one side is noted. No leaning to one side is noted. Posture is age-appropriate and stance is narrow based. Gait shows mild nonspecific insecurity, especially with turns, preserved arm swing but some tremor noted in both hands while walking, no shuffling.                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 fib, allergies, history of syncope, and overweight state, who presents for evaluation of his hand tremor of several years duration.  Tremor has progressed over time, on examination he does have a bilateral fairly symmetrical postural and action tremor, slight and intermittent resting component in the right more than left upper extremity, no telltale signs of parkinsonism and history not suggestive of Parkinson's disease.  He does not have a obvious family history of tremors.  Nevertheless, he could have essential tremor.   I had a long chat with the patient and his wife about my findings and his tremor, we talked about tremor triggers and potential symptomatic treatment options.  I outlined the first-line treatment option with a beta-blocker versus Mysoline/primidone.  I would not favor a beta-blocker in his case due to the fact that he has had syncope before and dizziness.  He also reports that sometimes his heart rate drops to the 60s.  Given his chronic kidney impairment and the fact that he has had balance issues, likely owing in part to his neuropathy I would not favor Mysoline because it can affect kidney function and cause sedation and balance issues.  We mutually agreed to not try any symptomatic medication.  He is advised to stay well-hydrated, limit his caffeine  and alcohol intake and follow-up with your office on a scheduled basis.  I would be happy to see him back as needed.  I answered all their questions today and the patient and his wife are in agreement. Thank you very much for allowing me to participate in the care of this nice patient. If I can be of any further assistance to you please do not hesitate to call me at [phone].  Sincerely,   Star Age, MD, PhD

## 2021-01-10 ENCOUNTER — Encounter: Payer: Self-pay | Admitting: Nurse Practitioner

## 2021-01-11 DIAGNOSIS — I10 Essential (primary) hypertension: Secondary | ICD-10-CM | POA: Diagnosis not present

## 2021-01-11 DIAGNOSIS — E538 Deficiency of other specified B group vitamins: Secondary | ICD-10-CM | POA: Diagnosis not present

## 2021-01-11 DIAGNOSIS — I4891 Unspecified atrial fibrillation: Secondary | ICD-10-CM | POA: Diagnosis not present

## 2021-01-12 LAB — LIPID PANEL WITH LDL/HDL RATIO
Cholesterol, Total: 170 mg/dL (ref 100–199)
HDL: 44 mg/dL (ref >39)
LDL Chol Calc (NIH): 101 mg/dL — ABNORMAL HIGH (ref 0–99)
LDL/HDL Ratio: 2.3 ratio (ref 0.0–3.6)
Triglycerides: 139 mg/dL (ref 0–149)
VLDL Cholesterol Cal: 25 mg/dL (ref 5–40)

## 2021-01-12 LAB — CBC WITH DIFFERENTIAL/PLATELET
Basophils Absolute: 0.1 10*3/uL (ref 0.0–0.2)
Basos: 1 %
EOS (ABSOLUTE): 0.5 10*3/uL — ABNORMAL HIGH (ref 0.0–0.4)
Eos: 9 %
Hematocrit: 38.5 % (ref 37.5–51.0)
Hemoglobin: 12.2 g/dL — ABNORMAL LOW (ref 13.0–17.7)
Immature Grans (Abs): 0 10*3/uL (ref 0.0–0.1)
Immature Granulocytes: 0 %
Lymphocytes Absolute: 2.4 10*3/uL (ref 0.7–3.1)
Lymphs: 41 %
MCH: 27.8 pg (ref 26.6–33.0)
MCHC: 31.7 g/dL (ref 31.5–35.7)
MCV: 88 fL (ref 79–97)
Monocytes Absolute: 0.4 10*3/uL (ref 0.1–0.9)
Monocytes: 6 %
Neutrophils Absolute: 2.5 10*3/uL (ref 1.4–7.0)
Neutrophils: 43 %
Platelets: 254 10*3/uL (ref 150–450)
RBC: 4.39 x10E6/uL (ref 4.14–5.80)
RDW: 15.2 % (ref 11.6–15.4)
WBC: 5.8 10*3/uL (ref 3.4–10.8)

## 2021-01-12 LAB — CMP14+EGFR
ALT: 7 IU/L (ref 0–44)
AST: 20 IU/L (ref 0–40)
Albumin/Globulin Ratio: 1.6 (ref 1.2–2.2)
Albumin: 4.4 g/dL (ref 3.6–4.6)
Alkaline Phosphatase: 73 IU/L (ref 44–121)
BUN/Creatinine Ratio: 13 (ref 10–24)
BUN: 19 mg/dL (ref 8–27)
Bilirubin Total: 0.5 mg/dL (ref 0.0–1.2)
CO2: 23 mmol/L (ref 20–29)
Calcium: 9.3 mg/dL (ref 8.6–10.2)
Chloride: 106 mmol/L (ref 96–106)
Creatinine, Ser: 1.42 mg/dL — ABNORMAL HIGH (ref 0.76–1.27)
Globulin, Total: 2.7 g/dL (ref 1.5–4.5)
Glucose: 102 mg/dL — ABNORMAL HIGH (ref 65–99)
Potassium: 4.3 mmol/L (ref 3.5–5.2)
Sodium: 143 mmol/L (ref 134–144)
Total Protein: 7.1 g/dL (ref 6.0–8.5)
eGFR: 49 mL/min/{1.73_m2} — ABNORMAL LOW (ref >59)

## 2021-01-12 LAB — VITAMIN B12: Vitamin B-12: >2000 pg/mL — ABNORMAL HIGH (ref 232–1245)

## 2021-01-12 NOTE — Progress Notes (Signed)
His B-12 was >2000, and the normal range is 232 - 1245. I don't think he needs the supplement if he has been taking in MWF. If he really wants to keep the supplement, he can cut back to once per week.

## 2021-01-12 NOTE — Progress Notes (Signed)
B12 is elevated, so you can cut back on that, and take it every other day. Kidney function is stable.

## 2021-01-15 ENCOUNTER — Encounter: Payer: Self-pay | Admitting: Nurse Practitioner

## 2021-01-15 ENCOUNTER — Ambulatory Visit (INDEPENDENT_AMBULATORY_CARE_PROVIDER_SITE_OTHER): Payer: Medicare Other | Admitting: Nurse Practitioner

## 2021-01-15 ENCOUNTER — Other Ambulatory Visit: Payer: Self-pay

## 2021-01-15 DIAGNOSIS — R5383 Other fatigue: Secondary | ICD-10-CM

## 2021-01-15 NOTE — Assessment & Plan Note (Addendum)
-  ortho VS unremarkable -reviewed previous EKG and echo, and his LVEF is >75% -discussed that when he increases activity, his heart is unable to fill with enough blood to meet demand d/t LV hypertrophy -he will decrease intensity of exercise to meet cardiac output, but should continue to exercise and walk his dog -lungs sound great on exam, and labs were unremarkable; denied bloody or dark, tarry stools

## 2021-01-15 NOTE — Progress Notes (Signed)
Acute Office Visit  Subjective:    Patient ID: Brent Wilkins, male    DOB: 03/06/1941, 80 y.o.   MRN: [id number]  Chief Complaint  Patient presents with   Fatigue    X 2 months     HPI Patient is in today for fatigue. He states that he has no energy.  Cardiology recently increased his losartan to 100 mg, and we increased his amlodipine to 10 mg on 10/16/20.  He states home BP is in 120s/70s at home.  He states his fatigue has been ongoing for several months.  He states that he is getting lightheaded when standing, and he runs out of energy when he is walking long distances.  He has been walking his dog about a half of a mile each night.  He is getting tired about halfway though his walk. He states he checked his HR and BP when he got home, and BP was fine and HR is in the 60s.  Past Medical History:  Diagnosis Date   Allergy    hay fever;  seasonal   Arthritis    right ankle   Cataract    surgery 2016   Chronic kidney disease    stones many years ago   Constipation 12/09/2018   Dizziness 12/13/2015   Dysphagia    Family history of malignant neoplasm of prostate 12/12/2017   Frequent urination at night    GERD (gastroesophageal reflux disease)    History of arthroplasty of right ankle 02/04/2020   History of kidney stones    History of prostate cancer 06/16/2017   HTN (hypertension)    Hyperlipidemia LDL goal <100 10/26/2019   Hypertension    Phreesia 04/24/2020   Hypothyroidism    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 on Tikosyn and Eliquis b. s/p ablation in 10/2019 due to break-through episodes while on Tikosyn.    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 Rosana Hoes   Schatzki's ring 10/22/2011   Seasonal allergies    Syncope  dx. 8 yrs ago   Syncope, cardiogenic 03/20/2017   Vaccine counseling 09/15/2019    Past Surgical  History:  Procedure Laterality Date   APPENDECTOMY     ATRIAL FIBRILLATION ABLATION N/A 10/06/2019   Procedure: ATRIAL FIBRILLATION ABLATION;  Surgeon: Constance Haw, MD;  Location: [facility];  Service: Cardiovascular;  Laterality: N/A;   CATARACT EXTRACTION W/PHACO Left 03/27/2015   Procedure: CATARACT EXTRACTION PHACO AND INTRAOCULAR LENS PLACEMENT LEFT EYE CDE=11.73;  Surgeon: Tonny Branch, MD;  Location: [facility];  Service: Ophthalmology;  Laterality: Left;   CATARACT EXTRACTION W/PHACO Right 03/30/2015   Procedure: CATARACT EXTRACTION PHACO AND INTRAOCULAR LENS PLACEMENT RIGHT EYE CDE=7.35;  Surgeon: Tonny Branch, MD;  Location: [facility];  Service: Ophthalmology;  Laterality: Right;   CHOLECYSTECTOMY     COLONOSCOPY  07/23/10   Dr. Vivi Ferns rectum, scattered pancolonic diverticula   COLONOSCOPY  08/10/1999   internal hemorrhoids,inflammatory polyp   ESOPHAGOGASTRODUODENOSCOPY  04/12/08   Prominent Schatzki's ring, erosive reflux esophagitis, multiple antral erosions, small hiatal hernia, reactive gastropathy, status post dilation with 79F   ESOPHAGOGASTRODUODENOSCOPY  10/28/2011   Procedure: ESOPHAGOGASTRODUODENOSCOPY (EGD);  Surgeon: Daneil Dolin, MD;  Location: [facility];  Service: Endoscopy;  Laterality: N/A;  1:30   ESOPHAGOGASTRODUODENOSCOPY N/A 09/10/2017   Dr. Gala Romney:  Schatkzi's ring s/p dilation at GE junction, mild esophageal reflux esophagitis, medium sized hiatal hernia, normal duodenum. 56 and 58 Fr dilation   EYE SURGERY     JOINT REPLACEMENT N/A    Phreesia 01/15/2020   MALONEY DILATION  10/28/2011   Procedure: Venia Minks DILATION;  Surgeon: Daneil Dolin, MD;  [facility];  Service: Endoscopy;  Laterality: N/A;   MALONEY DILATION N/A 09/10/2017   Procedure: Venia Minks DILATION;  Surgeon: Daneil Dolin, MD;  [facility];  Service: Endoscopy;  Laterality: N/A;   SAVORY DILATION  10/28/2011   Procedure: SAVORY DILATION;  Surgeon: Daneil Dolin,  MD;  [facility];  Service: Endoscopy;  Laterality: N/A;   TONSILLECTOMY     TOTAL ANKLE ARTHROPLASTY Left 11/25/2013   Procedure: TOTAL ANKLE ARTHOPLASTY;  Surgeon: Wylene Simmer, MD;  Location: [facility];  Service: Orthopedics;  Laterality: Left;   TOTAL ANKLE ARTHROPLASTY Right 07/14/2014   dr hewitt   TOTAL ANKLE ARTHROPLASTY Right 07/14/2014   Procedure: RIGHT TOTAL ANKLE ARTHOPLASTY;  Surgeon: Wylene Simmer, MD;  Location: [facility];  Service: Orthopedics;  Laterality: Right;    Family History  Problem Relation Age of Onset   Prostate cancer Father    Cancer Father        prostate to bone   Hypertension Father    Prostate cancer Son 5   ALS Son    Prostate cancer Brother    Heart disease Mother        died at 18   Arthritis Mother    Colon cancer Neg Hx     Social History   Socioeconomic History   Marital status: Married    Spouse name: Candace   Number of children: 2   Years of education: Not on file   Highest education level: Bachelor's degree (e.g., BA, AB, BS)  Occupational History   Occupation: retired; Ambulance person: RETIRED  Tobacco Use   Smoking status: Never   Smokeless tobacco: Never  Vaping Use   Vaping Use: Never used  Substance and Sexual Activity   Alcohol use: Yes    Alcohol/week: 2.0 standard drinks    Types: 1 Glasses of wine, 1 Cans of beer per week    Comment: glass of wine or "couple of beers" occasionally    Drug use: No   Sexual activity: Yes    Partners: Female  Other Topics Concern   Not on file  Social History Narrative   Lives with wife Candace.       Moved from [place], [place]   Retired from [employer].   Plays golf.   Works with [employer] and [employer] for [employer].   Wears seatbelt.   Drives.   Eats all food groups.   Never smoked.   Married for over 71 years June  (2020)   Has two grown children, live in [place] and [place].   Social Determinants of Health   Financial Resource Strain: Low Risk     Difficulty of Paying Living Expenses: Not hard at all  Food Insecurity: No Food Insecurity   Worried About Charity fundraiser in the Last Year: Never true   [place] in the Last Year: Never true  Transportation Needs: No Transportation Needs   Lack of Transportation (Medical): No   Lack of Transportation (Non-Medical): No  Physical Activity: Sufficiently Active   Days of Exercise per Week: 7 days   Minutes of  Exercise per Session: 60 min  Stress: No Stress Concern Present   Feeling of Stress : Not at all  Social Connections: Socially Integrated   Frequency of Communication with Friends and Family: More than three times a week   Frequency of Social Gatherings with Friends and Family: More than three times a week   Attends Religious Services: More than 4 times per year   Active Member of [employer] or Organizations: Yes   Attends Music therapist: More than 4 times per year   Marital Status: Married  Human resources officer Violence: Not on file    Outpatient Medications Prior to Visit  Medication Sig Dispense Refill   amLODipine (NORVASC) 5 MG tablet Take 1 tablet (5 mg total) by mouth daily. 90 tablet 3   diphenhydramine-acetaminophen (TYLENOL PM) 25-500 MG TABS tablet Take 1 tablet by mouth at bedtime as needed (sleep and mild discomfort).      dofetilide (TIKOSYN) 500 MCG capsule Take 1 capsule (500 mcg total) by mouth 2 (two) times daily. 180 capsule 2   ELIQUIS 5 MG TABS tablet TAKE 1 TABLET BY MOUTH  TWICE DAILY 180 tablet 1   finasteride (PROSCAR) 5 MG tablet Take 1 tablet by mouth daily.     fluticasone (FLONASE) 50 MCG/ACT nasal spray USE 1 SPRAY IN BOTH  NOSTRILS DAILY AS NEEDED  FOR ALLERGIES 32 g 2   gabapentin (NEURONTIN) 300 MG capsule Take 300 mg by mouth 2 (two) times daily.      ibuprofen (ADVIL,MOTRIN) 200 MG tablet Take 400 mg by mouth as needed for headache (headache/pain.).      levothyroxine (SYNTHROID) 75 MCG tablet TAKE 1 TABLET BY MOUTH  DAILY 90  tablet 3   loratadine (CLARITIN) 10 MG tablet Take 10 mg by mouth daily as needed for allergies.     losartan (COZAAR) 100 MG tablet Take 1 tablet (100 mg total) by mouth daily. 90 tablet 3   montelukast (SINGULAIR) 10 MG tablet Take 1 tablet (10 mg total) by mouth at bedtime. 30 tablet 3   Omega-3 Fatty Acids (RA FISH OIL) 1400 MG CPDR Take 1,400 mg by mouth daily.     pantoprazole (PROTONIX) 40 MG tablet TAKE 1 TABLET BY MOUTH  DAILY BEFORE BREAKFAST 90 tablet 3   Polyethyl Glycol-Propyl Glycol 0.4-0.3 % SOLN Place 1 drop into both eyes 2 (two) times daily as needed (dry eyes).      Tamsulosin HCl (FLOMAX) 0.4 MG CAPS Take 0.4 mg by mouth daily after breakfast.      vitamin B-12 (CYANOCOBALAMIN) 1000 MCG tablet Take 1,000 mcg by mouth daily.     No facility-administered medications prior to visit.    Allergies  Allergen Reactions   Tetanus Toxoids Swelling    Face swelling   Statins Other (See Comments)    Hard to walk, severe leg cramps Other reaction(s): Other (See Comments) Muscle weakness "unable to walk"   Dicyclomine Other (See Comments)    constipation   Sulfamethoxazole     Other reaction(s): Other (See Comments) unknown   Doxycycline Rash    Rash only   Oxycodone Other (See Comments)    Severe constipation   Penicillins Swelling and Rash    Took as a child, developed rash over time Has patient had a PCN reaction causing immediate rash, facial/tongue/throat swelling, SOB or lightheadedness with hypotension: Yes Has patient had a PCN reaction causing severe rash involving mucus membranes or skin necrosis: Unknown Has patient had a PCN reaction that  required hospitalization: No Has patient had a PCN reaction occurring within the last 10 years: No If all of the above answers are "NO", then may proceed with Cephalosporin use.  Other reaction(s): Other (See Comments) unknown   Sulfa Antibiotics Swelling and Rash   Xarelto [Rivaroxaban] Rash    rash    Review of  Systems  Constitutional:  Positive for fatigue. Negative for chills and fever.  Respiratory: Negative.    Cardiovascular: Negative.        Decreased exercise tolerance   Psychiatric/Behavioral: Negative.        Objective:    Physical Exam Constitutional:      Appearance: Normal appearance.  Cardiovascular:     Rate and Rhythm: Normal rate. Rhythm irregular.     Pulses: Normal pulses.     Heart sounds: Murmur heard.  Pulmonary:     Effort: Pulmonary effort is normal.     Breath sounds: Normal breath sounds.  Neurological:     Mental Status: He is alert.  Psychiatric:        Mood and Affect: Mood normal.        Behavior: Behavior normal.        Thought Content: Thought content normal.        Judgment: Judgment normal.    BP (!) 149/83 (BP Location: Right Arm, Patient Position: Sitting, Cuff Size: Large)   Pulse 71   Temp 98.4 F (36.9 C) (Oral)   Resp 20   Ht $R'5\' 11"'En$  (1.803 m)   Wt 201 lb (91.2 kg)   SpO2 98%   BMI 28.03 kg/m  Wt Readings from Last 3 Encounters:  01/15/21 201 lb (91.2 kg)  12/20/20 200 lb (90.7 kg)  11/16/20 200 lb (90.7 kg)    There are no preventive care reminders to display for this patient.  There are no preventive care reminders to display for this patient.   Lab Results  Component Value Date   TSH 3.500 10/17/2020   Lab Results  Component Value Date   WBC 5.8 01/11/2021   HGB 12.2 (L) 01/11/2021   HCT 38.5 01/11/2021   MCV 88 01/11/2021   PLT 254 01/11/2021   Lab Results  Component Value Date   NA 143 01/11/2021   K 4.3 01/11/2021   CO2 23 01/11/2021   GLUCOSE 102 (H) 01/11/2021   BUN 19 01/11/2021   CREATININE 1.42 (H) 01/11/2021   BILITOT 0.5 01/11/2021   ALKPHOS 73 01/11/2021   AST 20 01/11/2021   ALT 7 01/11/2021   PROT 7.1 01/11/2021   ALBUMIN 4.4 01/11/2021   CALCIUM 9.3 01/11/2021   ANIONGAP 11 07/07/2020   EGFR 49 (L) 01/11/2021   Lab Results  Component Value Date   CHOL 170 01/11/2021   Lab Results   Component Value Date   HDL 44 01/11/2021   Lab Results  Component Value Date   LDLCALC 101 (H) 01/11/2021   Lab Results  Component Value Date   TRIG 139 01/11/2021   Lab Results  Component Value Date   CHOLHDL 4.7 04/28/2020   Lab Results  Component Value Date   HGBA1C 5.5 04/28/2020       Assessment & Plan:   Problem List Items Addressed This Visit   None   No orders of the defined types were placed in this encounter.    Noreene Larsson, NP

## 2021-02-01 ENCOUNTER — Other Ambulatory Visit: Payer: Self-pay

## 2021-02-01 ENCOUNTER — Ambulatory Visit (INDEPENDENT_AMBULATORY_CARE_PROVIDER_SITE_OTHER): Payer: Medicare Other

## 2021-02-01 DIAGNOSIS — Z0000 Encounter for general adult medical examination without abnormal findings: Secondary | ICD-10-CM

## 2021-02-01 NOTE — Patient Instructions (Signed)
Brent Wilkins , Thank you for taking time to come for your Medicare Wellness Visit. I appreciate your ongoing commitment to your health goals. Please review the following plan we discussed and let me know if I can assist you in the future.   Screening recommendations/referrals: Colonoscopy: no longer required  Recommended yearly ophthalmology/optometry visit for glaucoma screening and checkup Recommended yearly dental visit for hygiene and checkup  Vaccinations: Influenza vaccine: Due in Sept 2022 Pneumococcal vaccine: Up to date  Tdap vaccine: Declined Shingles vaccine: up to date       Conditions/risks identified: fall risk   Next appointment: wellness in 1 year   Preventive Care 82 Years and Older, Male Preventive care refers to lifestyle choices and visits with your health care provider that can promote health and wellness. What does preventive care include? A yearly physical exam. This is also called an annual well check. Dental exams once or twice a year. Routine eye exams. Ask your health care provider how often you should have your eyes checked. Personal lifestyle choices, including: Daily care of your teeth and gums. Regular physical activity. Eating a healthy diet. Avoiding tobacco and drug use. Limiting alcohol use. Practicing safe sex. Taking low doses of aspirin every day. Taking vitamin and mineral supplements as recommended by your health care provider. What happens during an annual well check? The services and screenings done by your health care provider during your annual well check will depend on your age, overall health, lifestyle risk factors, and family history of disease. Counseling  Your health care provider may ask you questions about your: Alcohol use. Tobacco use. Drug use. Emotional well-being. Home and relationship well-being. Sexual activity. Eating habits. History of falls. Memory and ability to understand (cognition). Work and work  Statistician. Screening  You may have the following tests or measurements: Height, weight, and BMI. Blood pressure. Lipid and cholesterol levels. These may be checked every 5 years, or more frequently if you are over 18 years old. Skin check. Lung cancer screening. You may have this screening every year starting at age 82 if you have a 30-pack-year history of smoking and currently smoke or have quit within the past 15 years. Fecal occult blood test (FOBT) of the stool. You may have this test every year starting at age 82. Flexible sigmoidoscopy or colonoscopy. You may have a sigmoidoscopy every 5 years or a colonoscopy every 10 years starting at age 82. Prostate cancer screening. Recommendations will vary depending on your family history and other risks. Hepatitis C blood test. Hepatitis B blood test. Sexually transmitted disease (STD) testing. Diabetes screening. This is done by checking your blood sugar (glucose) after you have not eaten for a while (fasting). You may have this done every 1-3 years. Abdominal aortic aneurysm (AAA) screening. You may need this if you are a current or former smoker. Osteoporosis. You may be screened starting at age 39 if you are at high risk. Talk with your health care provider about your test results, treatment options, and if necessary, the need for more tests. Vaccines  Your health care provider may recommend certain vaccines, such as: Influenza vaccine. This is recommended every year. Tetanus, diphtheria, and acellular pertussis (Tdap, Td) vaccine. You may need a Td booster every 10 years. Zoster vaccine. You may need this after age 29. Pneumococcal 13-valent conjugate (PCV13) vaccine. One dose is recommended after age 78. Pneumococcal polysaccharide (PPSV23) vaccine. One dose is recommended after age 72. Talk to your health care provider about which  screenings and vaccines you need and how often you need them. This information is not intended to replace  advice given to you by your health care provider. Make sure you discuss any questions you have with your health care provider. Document Released: 08/18/2015 Document Revised: 04/10/2016 Document Reviewed: 05/23/2015 Elsevier Interactive Patient Education  2017 [place] Prevention in the Home Falls can cause injuries. They can happen to people of all ages. There are many things you can do to make your home safe and to help prevent falls. What can I do on the outside of my home? Regularly fix the edges of walkways and driveways and fix any cracks. Remove anything that might make you trip as you walk through a door, such as a raised step or threshold. Trim any bushes or trees on the path to your home. Use bright outdoor lighting. Clear any walking paths of anything that might make someone trip, such as rocks or tools. Regularly check to see if handrails are loose or broken. Make sure that both sides of any steps have handrails. Any raised decks and porches should have guardrails on the edges. Have any leaves, snow, or ice cleared regularly. Use sand or salt on walking paths during winter. Clean up any spills in your garage right away. This includes oil or grease spills. What can I do in the bathroom? Use night lights. Install grab bars by the toilet and in the tub and shower. Do not use towel bars as grab bars. Use non-skid mats or decals in the tub or shower. If you need to sit down in the shower, use a plastic, non-slip stool. Keep the floor dry. Clean up any water that spills on the floor as soon as it happens. Remove soap buildup in the tub or shower regularly. Attach bath mats securely with double-sided non-slip rug tape. Do not have throw rugs and other things on the floor that can make you trip. What can I do in the bedroom? Use night lights. Make sure that you have a light by your bed that is easy to reach. Do not use any sheets or blankets that are too big for your bed.  They should not hang down onto the floor. Have a firm chair that has side arms. You can use this for support while you get dressed. Do not have throw rugs and other things on the floor that can make you trip. What can I do in the kitchen? Clean up any spills right away. Avoid walking on wet floors. Keep items that you use a lot in easy-to-reach places. If you need to reach something above you, use a strong step stool that has a grab bar. Keep electrical cords out of the way. Do not use floor polish or wax that makes floors slippery. If you must use wax, use non-skid floor wax. Do not have throw rugs and other things on the floor that can make you trip. What can I do with my stairs? Do not leave any items on the stairs. Make sure that there are handrails on both sides of the stairs and use them. Fix handrails that are broken or loose. Make sure that handrails are as long as the stairways. Check any carpeting to make sure that it is firmly attached to the stairs. Fix any carpet that is loose or worn. Avoid having throw rugs at the top or bottom of the stairs. If you do have throw rugs, attach them to the floor with carpet  tape. Make sure that you have a light switch at the top of the stairs and the bottom of the stairs. If you do not have them, ask someone to add them for you. What else can I do to help prevent falls? Wear shoes that: Do not have high heels. Have rubber bottoms. Are comfortable and fit you well. Are closed at the toe. Do not wear sandals. If you use a stepladder: Make sure that it is fully opened. Do not climb a closed stepladder. Make sure that both sides of the stepladder are locked into place. Ask someone to hold it for you, if possible. Clearly mark and make sure that you can see: Any grab bars or handrails. First and last steps. Where the edge of each step is. Use tools that help you move around (mobility aids) if they are needed. These  include: Canes. Walkers. Scooters. Crutches. Turn on the lights when you go into a dark area. Replace any light bulbs as soon as they burn out. Set up your furniture so you have a clear path. Avoid moving your furniture around. If any of your floors are uneven, fix them. If there are any pets around you, be aware of where they are. Review your medicines with your doctor. Some medicines can make you feel dizzy. This can increase your chance of falling. Ask your doctor what other things that you can do to help prevent falls. This information is not intended to replace advice given to you by your health care provider. Make sure you discuss any questions you have with your health care provider. Document Released: 05/18/2009 Document Revised: 12/28/2015 Document Reviewed: 08/26/2014 Elsevier Interactive Patient Education  2017 [employer].

## 2021-02-01 NOTE — Progress Notes (Signed)
Subjective:   Brent Wilkins is a 80 y.o. male who presents for Medicare Annual/Subsequent preventive examination.  Review of Systems     Cardiac Risk Factors include: dyslipidemia;hypertension;male gender     Objective:    Today's Vitals   02/01/21 1356  PainSc: 0-No pain   There is no height or weight on file to calculate BMI.  Advanced Directives 01/31/2020 10/06/2019 12/07/2018 09/10/2017 05/05/2017 03/21/2017 03/27/2015  Does Patient Have a Medical Advance Directive? No No No No No No No  Would patient like information on creating a medical advance directive? Yes (ED - Information included in AVS) No - Patient declined No - Patient declined No - Patient declined No - Patient declined No - Patient declined No - patient declined information  Pre-existing out of facility DNR order (yellow form or pink MOST form) - - - - - - -    Current Medications (verified) Outpatient Encounter Medications as of 02/01/2021  Medication Sig   amLODipine (NORVASC) 5 MG tablet Take 1 tablet (5 mg total) by mouth daily.   diphenhydramine-acetaminophen (TYLENOL PM) 25-500 MG TABS tablet Take 1 tablet by mouth at bedtime as needed (sleep and mild discomfort).    dofetilide (TIKOSYN) 500 MCG capsule Take 1 capsule (500 mcg total) by mouth 2 (two) times daily.   ELIQUIS 5 MG TABS tablet TAKE 1 TABLET BY MOUTH  TWICE DAILY   finasteride (PROSCAR) 5 MG tablet Take 1 tablet by mouth daily.   fluticasone (FLONASE) 50 MCG/ACT nasal spray USE 1 SPRAY IN BOTH  NOSTRILS DAILY AS NEEDED  FOR ALLERGIES   gabapentin (NEURONTIN) 300 MG capsule Take 300 mg by mouth 2 (two) times daily.    ibuprofen (ADVIL,MOTRIN) 200 MG tablet Take 400 mg by mouth as needed for headache (headache/pain.).    levothyroxine (SYNTHROID) 75 MCG tablet TAKE 1 TABLET BY MOUTH  DAILY   loratadine (CLARITIN) 10 MG tablet Take 10 mg by mouth daily as needed for allergies.   losartan (COZAAR) 100 MG tablet Take 1 tablet (100 mg total) by mouth  daily.   montelukast (SINGULAIR) 10 MG tablet Take 1 tablet (10 mg total) by mouth at bedtime.   Omega-3 Fatty Acids (RA FISH OIL) 1400 MG CPDR Take 1,400 mg by mouth daily.   pantoprazole (PROTONIX) 40 MG tablet TAKE 1 TABLET BY MOUTH  DAILY BEFORE BREAKFAST   Polyethyl Glycol-Propyl Glycol 0.4-0.3 % SOLN Place 1 drop into both eyes 2 (two) times daily as needed (dry eyes).    Tamsulosin HCl (FLOMAX) 0.4 MG CAPS Take 0.4 mg by mouth daily after breakfast.    No facility-administered encounter medications on file as of 02/01/2021.    Allergies (verified) Tetanus toxoids, Statins, Dicyclomine, Sulfamethoxazole, Doxycycline, Oxycodone, Penicillins, Sulfa antibiotics, and Xarelto [rivaroxaban]   History: Past Medical History:  Diagnosis Date   Allergy    hay fever;  seasonal   Arthritis    right ankle   Cataract    surgery 2016   Chronic kidney disease    stones many years ago   Constipation 12/09/2018   Dizziness 12/13/2015   Dysphagia    Family history of malignant neoplasm of prostate 12/12/2017   Frequent urination at night    GERD (gastroesophageal reflux disease)    History of arthroplasty of right ankle 02/04/2020   History of kidney stones    History of prostate cancer 06/16/2017   HTN (hypertension)    Hyperlipidemia LDL goal <100 10/26/2019   Hypertension  Phreesia 04/24/2020   Hypothyroidism    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 on Tikosyn and Eliquis b. s/p ablation in 10/2019 due to break-through episodes while on Tikosyn.    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 Rosana Hoes   Schatzki's ring 10/22/2011   Seasonal allergies    Syncope  dx. 8 yrs ago   Syncope, cardiogenic 03/20/2017   Vaccine counseling 09/15/2019   Past Surgical History:  Procedure Laterality Date   APPENDECTOMY     ATRIAL FIBRILLATION ABLATION N/A  10/06/2019   Procedure: ATRIAL FIBRILLATION ABLATION;  Surgeon: Constance Haw, MD;  Location: [place] [facility];  Service: Cardiovascular;  Laterality: N/A;   CATARACT EXTRACTION W/PHACO Left 03/27/2015   Procedure: CATARACT EXTRACTION PHACO AND INTRAOCULAR LENS PLACEMENT LEFT EYE CDE=11.73;  Surgeon: Tonny Branch, MD;  Location: [facility];  Service: Ophthalmology;  Laterality: Left;   CATARACT EXTRACTION W/PHACO Right 03/30/2015   Procedure: CATARACT EXTRACTION PHACO AND INTRAOCULAR LENS PLACEMENT RIGHT EYE CDE=7.35;  Surgeon: Tonny Branch, MD;  Location: [facility];  Service: Ophthalmology;  Laterality: Right;   CHOLECYSTECTOMY     COLONOSCOPY  07/23/10   Dr. Vivi Ferns rectum, scattered pancolonic diverticula   COLONOSCOPY  08/10/1999   internal hemorrhoids,inflammatory polyp   ESOPHAGOGASTRODUODENOSCOPY  04/12/08   Prominent Schatzki's ring, erosive reflux esophagitis, multiple antral erosions, small hiatal hernia, reactive gastropathy, status post dilation with 48F   ESOPHAGOGASTRODUODENOSCOPY  10/28/2011   Procedure: ESOPHAGOGASTRODUODENOSCOPY (EGD);  Surgeon: Daneil Dolin, MD;  Location: [facility];  Service: Endoscopy;  Laterality: N/A;  1:30   ESOPHAGOGASTRODUODENOSCOPY N/A 09/10/2017   Dr. Gala Romney: Schatkzi's ring s/p dilation at GE junction, mild esophageal reflux esophagitis, medium sized hiatal hernia, normal duodenum. 56 and 58 Fr dilation   EYE SURGERY     JOINT REPLACEMENT N/A    Phreesia 01/15/2020   MALONEY DILATION  10/28/2011   Procedure: Venia Minks DILATION;  Surgeon: Daneil Dolin, MD;  [facility];  Service: Endoscopy;  Laterality: N/A;   MALONEY DILATION N/A 09/10/2017   Procedure: Venia Minks DILATION;  Surgeon: Daneil Dolin, MD;  [facility];  Service: Endoscopy;  Laterality: N/A;   SAVORY DILATION  10/28/2011   Procedure: SAVORY DILATION;  Surgeon: Daneil Dolin, MD;  [facility];  Service: Endoscopy;  Laterality: N/A;   TONSILLECTOMY      TOTAL ANKLE ARTHROPLASTY Left 11/25/2013   Procedure: TOTAL ANKLE ARTHOPLASTY;  Surgeon: Wylene Simmer, MD;  Location: [facility];  Service: Orthopedics;  Laterality: Left;   TOTAL ANKLE ARTHROPLASTY Right 07/14/2014   dr hewitt   TOTAL ANKLE ARTHROPLASTY Right 07/14/2014   Procedure: RIGHT TOTAL ANKLE ARTHOPLASTY;  Surgeon: Wylene Simmer, MD;  Location: [facility];  Service: Orthopedics;  Laterality: Right;   Family History  Problem Relation Age of Onset   Prostate cancer Father    Cancer Father        prostate to bone   Hypertension Father    Prostate cancer Son 66   ALS Son    Prostate cancer Brother    Heart disease Mother        died at 107   Arthritis Mother    Colon cancer Neg Hx    Social History   Socioeconomic History   Marital status: Married    Spouse  name: Candace   Number of children: 2   Years of education: Not on file   Highest education level: Bachelor's degree (e.g., BA, AB, BS)  Occupational History   Occupation: retired; Ambulance person: RETIRED  Tobacco Use   Smoking status: Never   Smokeless tobacco: Never  Vaping Use   Vaping Use: Never used  Substance and Sexual Activity   Alcohol use: Yes    Alcohol/week: 2.0 standard drinks    Types: 1 Glasses of wine, 1 Cans of beer per week    Comment: glass of wine or "couple of beers" occasionally    Drug use: No   Sexual activity: Yes    Partners: Female  Other Topics Concern   Not on file  Social History Narrative   Lives with wife Candace.       Moved from [place], [place]   Retired from [employer].   Plays golf.   Works with [employer] and [employer] for [employer].   Wears seatbelt.   Drives.   Eats all food groups.   Never smoked.   Married for over 12 years June  (2020)   Has two grown children, live in [place] and [place].   Social Determinants of Health   Financial Resource Strain: Low Risk    Difficulty of Paying Living Expenses: Not hard at all  Food Insecurity: No Food Insecurity    Worried About Charity fundraiser in the Last Year: Never true   Daisy in the Last Year: Never true  Transportation Needs: No Transportation Needs   Lack of Transportation (Medical): No   Lack of Transportation (Non-Medical): No  Physical Activity: Not on file  Stress: Not on file  Social Connections: Socially Integrated   Frequency of Communication with Friends and Family: More than three times a week   Frequency of Social Gatherings with Friends and Family: More than three times a week   Attends Religious Services: More than 4 times per year   Active Member of [employer] or Organizations: Yes   Attends Music therapist: More than 4 times per year   Marital Status: Married    Tobacco Counseling Counseling given: Not Answered   Clinical Intake:  Pre-visit preparation completed: No  Pain : No/denies pain Pain Score: 0-No pain     Nutritional Status: BMI 25 -29 Overweight Diabetes: No  How often do you need to have someone help you when you read instructions, pamphlets, or other written materials from your doctor or pharmacy?: 1 - Never What is the last grade level you completed in school?: college  Diabetic?no     Information entered by :: Chanel Mckesson LPN   Activities of Daily Living In your present state of health, do you have any difficulty performing the following activities: 02/01/2021  Hearing? Y  Comment wears hearing aids  Vision? N  Difficulty concentrating or making decisions? N  Walking or climbing stairs? Y  Comment both ankle replacements and arthritis and neuropathy  Dressing or bathing? N  Doing errands, shopping? N  Preparing Food and eating ? N  Using the Toilet? N  In the past six months, have you accidently leaked urine? N  Do you have problems with loss of bowel control? N  Managing your Medications? N  Managing your Finances? N  Housekeeping or managing your Housekeeping? N  Some recent data might be hidden    Patient  Care Team: Noreene Larsson, NP as  PCP - General (Nurse Practitioner) Constance Haw, MD as PCP - Electrophysiology (Cardiology) Fay Records, MD as PCP - Cardiology (Cardiology) Daneil Dolin, MD (Gastroenterology) Myrlene Broker, MD as Attending Physician (Urology)  Indicate any recent Medical Services you may have received from other than [facility] providers in the past year (date may be approximate).     Assessment:   This is a routine wellness examination for Brent Wilkins.  Hearing/Vision screen No results found.  Dietary issues and exercise activities discussed: Current Exercise Habits: Home exercise routine, Time (Minutes): 30, Frequency (Times/Week): 6, Weekly Exercise (Minutes/Week): 180, Exercise limited by: orthopedic condition(s)   Goals Addressed             This Visit's Progress    Exercise 150 minutes per week (moderate activity)   On track    Walks dog daily and works in the yard      Prevent falls   On track      Depression Screen PHQ 2/9 Scores 02/01/2021 02/01/2021 01/15/2021 11/16/2020 10/16/2020 07/25/2020 07/10/2020  PHQ - 2 Score 0 0 0 0 0 0 0  PHQ- 9 Score - - - - - - -    Fall Risk Fall Risk  02/01/2021 01/15/2021 11/16/2020 10/16/2020 08/03/2020  Falls in the past year? 0 0 0 0 0  Number falls in past yr: 0 0 0 0 0  Injury with Fall? 0 0 0 0 0  Risk Factor Category  - - - - -  Risk for fall due to : - No Fall Risks No Fall Risks No Fall Risks -  Follow up - Falls evaluation completed Falls evaluation completed Falls evaluation completed -    FALL RISK PREVENTION PERTAINING TO THE HOME:  Any stairs in or around the home? Yes  If so, are there any without handrails? No  Home free of loose throw rugs in walkways, pet beds, electrical cords, etc? Yes  Adequate lighting in your home to reduce risk of falls? Yes   ASSISTIVE DEVICES UTILIZED TO PREVENT FALLS:  Life alert? No  Use of a cane, walker or w/c? Yes  Grab bars in the bathroom? Yes   Shower chair or bench in shower? Yes  Elevated toilet seat or a handicapped toilet? Yes   TIMED UP AND GO:  Was the test performed? Yes .  Length of time to ambulate 10 feet: 8 sec.   Gait slow and steady without use of assistive device  Cognitive Function:     6CIT Screen 02/01/2021 01/31/2020 12/31/2018  What Year? 0 points 0 points 0 points  What month? 0 points 0 points 0 points  What time? 0 points 0 points 0 points  Count back from 20 0 points 0 points 0 points  Months in reverse 0 points 0 points 0 points  Repeat phrase 0 points 0 points 0 points  Total Score 0 0 0    Immunizations Immunization History  Administered Date(s) Administered   Fluad Quad(high Dose 65+) 04/27/2019, 04/27/2020   Influenza, High Dose Seasonal PF 05/07/2017, 06/12/2018   Moderna Sars-Covid-2 Vaccination 08/17/2019, 09/17/2019, 04/09/2020   Pneumococcal Conjugate-13 06/16/2017   Pneumococcal Polysaccharide-23 01/02/2018   Zoster Recombinat (Shingrix) 04/07/2018, 09/22/2018    Tdap- allergic   Flu Vaccine status: Up to date  Pneumococcal vaccine status: Up to date  Covid-19 vaccine status: Completed vaccines  Qualifies for Shingles Vaccine? Yes   Zostavax completed Yes   Shingrix Completed?: Yes  Screening Tests Health Maintenance  Topic Date Due   COVID-19 Vaccine (4 - Booster for Moderna series) 04/02/2021 (Originally 07/09/2020)   INFLUENZA VACCINE  03/05/2021   PNA vac Low Risk Adult  Completed   Zoster Vaccines- Shingrix  Completed   HPV VACCINES  Aged Out   TETANUS/TDAP  Discontinued    Health Maintenance  There are no preventive care reminders to display for this patient.  Colorectal cancer screening: No longer required.   Lung Cancer Screening: (Low Dose CT Chest recommended if Age 57-80 years, 30 pack-year currently smoking OR have quit w/in 15years.) does not qualify.   Lung Cancer Screening Referral: does not qualify   Additional Screening:  Hepatitis C  Screening: does qualify; Completed  Vision Screening: Recommended annual ophthalmology exams for early detection of glaucoma and other disorders of the eye. Is the patient up to date with their annual eye exam?  Yes  Who is the provider or what is the name of the office in which the patient attends annual eye exams? [facility] dr Jorja Loa  If pt is not established with a provider, would they like to be referred to a provider to establish care? No .   Dental Screening: Recommended annual dental exams for proper oral hygiene  Community Resource Referral / Chronic Care Management: CRR required this visit?  No   CCM required this visit?  No      Plan:     I have personally reviewed and noted the following in the patient's chart:   Medical and social history Use of alcohol, tobacco or illicit drugs  Current medications and supplements including opioid prescriptions. Patient is not currently taking opioid prescriptions. Functional ability and status Nutritional status Physical activity Advanced directives List of other physicians Hospitalizations, surgeries, and ER visits in previous 12 months Vitals Screenings to include cognitive, depression, and falls Referrals and appointments  In addition, I have reviewed and discussed with patient certain preventive protocols, quality metrics, and best practice recommendations. A written personalized care plan for preventive services as well as general preventive health recommendations were provided to patient.     Kate Sable, LPN, LPN   [id number]   Nurse Notes: Pt consents to in person AWV with provider on site at the time. Pt has been seeing Donneta Romberg, AGNP-C since his PCP was out of the practice. Pt is compliant and up to date on health maintenance goals.

## 2021-03-05 ENCOUNTER — Encounter: Payer: Self-pay | Admitting: Internal Medicine

## 2021-03-20 DIAGNOSIS — D225 Melanocytic nevi of trunk: Secondary | ICD-10-CM | POA: Diagnosis not present

## 2021-03-20 DIAGNOSIS — L570 Actinic keratosis: Secondary | ICD-10-CM | POA: Diagnosis not present

## 2021-03-20 DIAGNOSIS — X32XXXD Exposure to sunlight, subsequent encounter: Secondary | ICD-10-CM | POA: Diagnosis not present

## 2021-03-25 NOTE — Progress Notes (Signed)
PCP:  Noreene Larsson, NP Primary Cardiologist: Dorris Carnes, MD Electrophysiologist: Will [name], MD   Brent Wilkins Chou is a 80 y.o. male seen today for Will [name], MD for routine electrophysiology followup.  Since last being seen in our clinic the patient reports doing overall well. Does have some fatigue, especially when outside in the heat.  Feels like he just runs out of steam in the evenings at times.  he denies chest pain, palpitations, dyspnea, PND, orthopnea, nausea, vomiting, dizziness, syncope, edema, weight gain, or early satiety.  Past Medical History:  Diagnosis Date   Allergy    hay fever;  seasonal   Arthritis    right ankle   Cataract    surgery 2016   Chronic kidney disease    stones many years ago   Constipation 12/09/2018   Dizziness 12/13/2015   Dysphagia    Family history of malignant neoplasm of prostate 12/12/2017   Frequent urination at night    GERD (gastroesophageal reflux disease)    History of arthroplasty of right ankle 02/04/2020   History of kidney stones    History of prostate cancer 06/16/2017   HTN (hypertension)    Hyperlipidemia LDL goal <100 10/26/2019   Hypertension    Phreesia 04/24/2020   Hypothyroidism    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 on Tikosyn and Eliquis b. s/p ablation in 10/2019 due to break-through episodes while on Tikosyn.    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 Rosana Hoes   Schatzki's ring 10/22/2011   Seasonal allergies    Syncope  dx. 8 yrs ago   Syncope, cardiogenic 03/20/2017   Vaccine counseling 09/15/2019   Past Surgical History:  Procedure Laterality Date   APPENDECTOMY     ATRIAL FIBRILLATION ABLATION N/A 10/06/2019   Procedure: ATRIAL FIBRILLATION ABLATION;  Surgeon: Constance Haw, MD;  Location: [place] [facility];  Service: Cardiovascular;   Laterality: N/A;   CATARACT EXTRACTION W/PHACO Left 03/27/2015   Procedure: CATARACT EXTRACTION PHACO AND INTRAOCULAR LENS PLACEMENT LEFT EYE CDE=11.73;  Surgeon: Tonny Branch, MD;  [facility];  Service: Ophthalmology;  Laterality: Left;   CATARACT EXTRACTION W/PHACO Right 03/30/2015   Procedure: CATARACT EXTRACTION PHACO AND INTRAOCULAR LENS PLACEMENT RIGHT EYE CDE=7.35;  Surgeon: Tonny Branch, MD;  Location: [facility];  Service: Ophthalmology;  Laterality: Right;   CHOLECYSTECTOMY     COLONOSCOPY  07/23/10   Dr. Vivi Ferns rectum, scattered pancolonic diverticula   COLONOSCOPY  08/10/1999   internal hemorrhoids,inflammatory polyp   ESOPHAGOGASTRODUODENOSCOPY  04/12/08   Prominent Schatzki's ring, erosive reflux esophagitis, multiple antral erosions, small hiatal hernia, reactive gastropathy, status post dilation with 66F   ESOPHAGOGASTRODUODENOSCOPY  10/28/2011   Procedure: ESOPHAGOGASTRODUODENOSCOPY (EGD);  Surgeon: Daneil Dolin, MD;  [facility];  Service: Endoscopy;  Laterality: N/A;  1:30   ESOPHAGOGASTRODUODENOSCOPY N/A 09/10/2017   Dr. Gala Romney: Schatkzi's ring s/p dilation at GE junction, mild esophageal reflux esophagitis, medium sized hiatal hernia, normal duodenum. 56 and 58 Fr dilation   EYE SURGERY     JOINT REPLACEMENT N/A    Phreesia 01/15/2020   MALONEY DILATION  10/28/2011   Procedure: Venia Minks DILATION;  Surgeon: Daneil Dolin, MD;  [facility];  Service: Endoscopy;  Laterality: N/A;   MALONEY DILATION N/A 09/10/2017   Procedure:  MALONEY DILATION;  Surgeon: Daneil Dolin, MD;  [facility];  Service: Endoscopy;  Laterality: N/A;   SAVORY DILATION  10/28/2011   Procedure: SAVORY DILATION;  Surgeon: Daneil Dolin, MD;  [facility];  Service: Endoscopy;  Laterality: N/A;   TONSILLECTOMY     TOTAL ANKLE ARTHROPLASTY Left 11/25/2013   Procedure: TOTAL ANKLE ARTHOPLASTY;  Surgeon: Wylene Simmer, MD;  Location: [place];  Service: Orthopedics;   Laterality: Left;   TOTAL ANKLE ARTHROPLASTY Right 07/14/2014   dr hewitt   TOTAL ANKLE ARTHROPLASTY Right 07/14/2014   Procedure: RIGHT TOTAL ANKLE ARTHOPLASTY;  Surgeon: Wylene Simmer, MD;  Location: [place];  Service: Orthopedics;  Laterality: Right;    Current Outpatient Medications  Medication Sig Dispense Refill   amLODipine (NORVASC) 5 MG tablet Take 1 tablet (5 mg total) by mouth daily. 90 tablet 3   diphenhydramine-acetaminophen (TYLENOL PM) 25-500 MG TABS tablet Take 1 tablet by mouth at bedtime as needed (sleep and mild discomfort).      dofetilide (TIKOSYN) 500 MCG capsule Take 1 capsule (500 mcg total) by mouth 2 (two) times daily. 180 capsule 2   ELIQUIS 5 MG TABS tablet TAKE 1 TABLET BY MOUTH  TWICE DAILY 180 tablet 1   finasteride (PROSCAR) 5 MG tablet Take 1 tablet by mouth daily.     fluticasone (FLONASE) 50 MCG/ACT nasal spray USE 1 SPRAY IN BOTH  NOSTRILS DAILY AS NEEDED  FOR ALLERGIES 32 g 2   gabapentin (NEURONTIN) 300 MG capsule Take 300 mg by mouth 2 (two) times daily.      ibuprofen (ADVIL,MOTRIN) 200 MG tablet Take 400 mg by mouth as needed for headache (headache/pain.).      levothyroxine (SYNTHROID) 75 MCG tablet TAKE 1 TABLET BY MOUTH  DAILY 90 tablet 3   loratadine (CLARITIN) 10 MG tablet Take 10 mg by mouth daily as needed for allergies.     losartan (COZAAR) 100 MG tablet Take 1 tablet (100 mg total) by mouth daily. 90 tablet 3   montelukast (SINGULAIR) 10 MG tablet Take 1 tablet (10 mg total) by mouth at bedtime. 30 tablet 3   Omega-3 Fatty Acids (RA FISH OIL) 1400 MG CPDR Take 1,400 mg by mouth daily.     pantoprazole (PROTONIX) 40 MG tablet TAKE 1 TABLET BY MOUTH  DAILY BEFORE BREAKFAST 90 tablet 3   Polyethyl Glycol-Propyl Glycol 0.4-0.3 % SOLN Place 1 drop into both eyes 2 (two) times daily as needed (dry eyes).      Tamsulosin HCl (FLOMAX) 0.4 MG CAPS Take 0.4 mg by mouth daily after breakfast.      No current facility-administered medications for this  visit.    Allergies  Allergen Reactions   Tetanus Toxoids Swelling    Face swelling   Statins Other (See Comments)    Hard to walk, severe leg cramps Other reaction(s): Other (See Comments) Muscle weakness "unable to walk"   Dicyclomine Other (See Comments)    constipation   Sulfamethoxazole     Other reaction(s): Other (See Comments) unknown   Doxycycline Rash    Rash only   Oxycodone Other (See Comments)    Severe constipation   Penicillins Swelling and Rash    Took as a child, developed rash over time Has patient had a PCN reaction causing immediate rash, facial/tongue/throat swelling, SOB or lightheadedness with hypotension: Yes Has patient had a PCN reaction causing severe rash involving mucus membranes or skin necrosis: Unknown Has patient had a  PCN reaction that required hospitalization: No Has patient had a PCN reaction occurring within the last 10 years: No If all of the above answers are "NO", then may proceed with Cephalosporin use.  Other reaction(s): Other (See Comments) unknown   Sulfa Antibiotics Swelling and Rash   Xarelto [Rivaroxaban] Rash    rash    Social History   Socioeconomic History   Marital status: Married    Spouse name: Candace   Number of children: 2   Years of education: Not on file   Highest education level: Bachelor's degree (e.g., BA, AB, BS)  Occupational History   Occupation: retired; Ambulance person: RETIRED  Tobacco Use   Smoking status: Never   Smokeless tobacco: Never  Vaping Use   Vaping Use: Never used  Substance and Sexual Activity   Alcohol use: Yes    Alcohol/week: 2.0 standard drinks    Types: 1 Glasses of wine, 1 Cans of beer per week    Comment: glass of wine or "couple of beers" occasionally    Drug use: No   Sexual activity: Yes    Partners: Female  Other Topics Concern   Not on file  Social History Narrative   Lives with wife Candace.       Moved from [place], [place]   Retired from  [employer].   Plays golf.   Works with [employer] and [employer] for [employer].   Wears seatbelt.   Drives.   Eats all food groups.   Never smoked.   Married for over 85 years June  (2020)   Has two grown children, live in [place] and [place].   Social Determinants of Health   Financial Resource Strain: Low Risk    Difficulty of Paying Living Expenses: Not hard at all  Food Insecurity: No Food Insecurity   Worried About Charity fundraiser in the Last Year: Never true   [place] in the Last Year: Never true  Transportation Needs: No Transportation Needs   Lack of Transportation (Medical): No   Lack of Transportation (Non-Medical): No  Physical Activity: Not on file  Stress: Not on file  Social Connections: Socially Integrated   Frequency of Communication with Friends and Family: More than three times a week   Frequency of Social Gatherings with Friends and Family: More than three times a week   Attends Religious Services: More than 4 times per year   Active Member of [employer] or Organizations: Yes   Attends Music therapist: More than 4 times per year   Marital Status: Married  Human resources officer Violence: Not on file     Review of Systems: All other systems reviewed and are otherwise negative except as noted above.  Physical Exam: Vitals:   03/26/21 1200  BP: (!) 150/88  Pulse: 69  SpO2: 100%  Weight: 199 lb (90.3 kg)  Height: '5\' 10"'$  (1.778 m)    GEN- The patient is well appearing, alert and oriented x 3 today.   HEENT: normocephalic, atraumatic; sclera clear, conjunctiva pink; hearing intact; oropharynx clear; neck supple, no JVP Lymph- no cervical lymphadenopathy Lungs- Clear to ausculation bilaterally, normal work of breathing.  No wheezes, rales, rhonchi Heart- Regular rate and rhythm, no murmurs, rubs or gallops, PMI not laterally displaced GI- soft, non-tender, non-distended, bowel sounds present, no hepatosplenomegaly Extremities- no clubbing,  cyanosis, or edema; DP/PT/radial pulses 2+ bilaterally MS- no significant deformity or atrophy Skin- warm and dry, no rash or  lesion Psych- euthymic mood, full affect Neuro- strength and sensation are intact  EKG is ordered. Personal review of EKG from today shows NSR at 69 bpm with stable QTc  Additional studies reviewed include: Previous EP office notes.   Assessment and Plan:    1.  Persistent atrial fibrillation/flutter:  Continue eliquis for CHA2DS2-VASc of at least 3. S/p Ablation 10/2019 Continue tikosyn.  EKG today shows NSR with stable QTc    2.  Syncope:  None in years     3.  Hypertension:  Remains labile on current regiment. 0000000 - [id number] systolic at home.  Follow for now.  Low blood pressures currently seem worse given the heat. Stressed importance of hydration.    Labs today. Keep 2-3 month follow up at patient request to see if fatigue and intermittent dizziness improve with fall temps.  Shirley Friar, PA-C  03/26/21 1:02 PM

## 2021-03-26 ENCOUNTER — Ambulatory Visit: Payer: Medicare Other | Admitting: Student

## 2021-03-26 ENCOUNTER — Encounter: Payer: Self-pay | Admitting: Student

## 2021-03-26 ENCOUNTER — Other Ambulatory Visit: Payer: Self-pay

## 2021-03-26 VITALS — BP 150/88 | HR 69 | Ht 70.0 in | Wt 199.0 lb

## 2021-03-26 DIAGNOSIS — R55 Syncope and collapse: Secondary | ICD-10-CM

## 2021-03-26 DIAGNOSIS — I10 Essential (primary) hypertension: Secondary | ICD-10-CM

## 2021-03-26 DIAGNOSIS — I4819 Other persistent atrial fibrillation: Secondary | ICD-10-CM | POA: Diagnosis not present

## 2021-03-26 LAB — MAGNESIUM: Magnesium: 2.1 mg/dL (ref 1.6–2.3)

## 2021-03-26 LAB — BASIC METABOLIC PANEL
BUN/Creatinine Ratio: 13 (ref 10–24)
BUN: 15 mg/dL (ref 8–27)
CO2: 21 mmol/L (ref 20–29)
Calcium: 9.1 mg/dL (ref 8.6–10.2)
Chloride: 107 mmol/L — ABNORMAL HIGH (ref 96–106)
Creatinine, Ser: 1.17 mg/dL (ref 0.76–1.27)
Glucose: 91 mg/dL (ref 65–99)
Potassium: 4.3 mmol/L (ref 3.5–5.2)
Sodium: 141 mmol/L (ref 134–144)
eGFR: 62 mL/min/{1.73_m2} (ref >59)

## 2021-03-26 NOTE — Patient Instructions (Signed)
Medication Instructions:  Your physician recommends that you continue on your current medications as directed. Please refer to the Current Medication list given to you today.  *If you need a refill on your cardiac medications before your next appointment, please call your pharmacy*   Lab Work: TODAY: BMET, Mg  If you have labs (blood work) drawn today and your tests are completely normal, you will receive your results only by: [place] (if you have MyChart) OR A paper copy in the mail If you have any lab test that is abnormal or we need to change your treatment, we will call you to review the results.  Follow-Up: At [facility], you and your health needs are our priority.  As part of our continuing mission to provide you with exceptional heart care, we have created designated Provider Care Teams.  These Care Teams include your primary Cardiologist (physician) and Advanced Practice Providers (APPs -  Physician Assistants and Nurse Practitioners) who all work together to provide you with the care you need, when you need it.  Your next appointment:   As scheduled

## 2021-04-03 ENCOUNTER — Other Ambulatory Visit: Payer: Self-pay | Admitting: Internal Medicine

## 2021-04-03 NOTE — Telephone Encounter (Signed)
Prescription refill request for Eliquis received.  Indication: afib  Last office visit: [place], 03/26/2021 Scr: 1.42, 03/26/2021 Age: 80 yo  Weight: 90.3 kg   Refill sent.

## 2021-04-04 NOTE — Addendum Note (Signed)
Addended by: Eual Fines on: 04/04/2021 02:26 PM   Modules accepted: Orders, Level of Service, SmartSet

## 2021-04-24 ENCOUNTER — Encounter: Payer: Self-pay | Admitting: Internal Medicine

## 2021-04-24 ENCOUNTER — Other Ambulatory Visit: Payer: Self-pay

## 2021-04-24 ENCOUNTER — Ambulatory Visit: Payer: Medicare Other | Admitting: Internal Medicine

## 2021-04-24 VITALS — BP 172/93 | HR 69 | Temp 97.1°F | Ht 70.0 in | Wt 200.4 lb

## 2021-04-24 DIAGNOSIS — K219 Gastro-esophageal reflux disease without esophagitis: Secondary | ICD-10-CM | POA: Diagnosis not present

## 2021-04-24 DIAGNOSIS — R1310 Dysphagia, unspecified: Secondary | ICD-10-CM | POA: Diagnosis not present

## 2021-04-24 NOTE — Patient Instructions (Signed)
It was good seeing you again today!  As discussed, you should continue Protonix 40 mg once daily indefinitely  No further evaluation here unless new symptoms develop  Please let me know if you have any future issues with food sticking or other concerns.  GERD information provided.

## 2021-04-24 NOTE — Progress Notes (Signed)
Primary Care Physician:  Noreene Larsson, NP Primary Gastroenterologist:  Dr. Gala Romney  Pre-Procedure History & Physical: HPI:  Brent Wilkins is a 80 y.o. male here for for 1 year follow-up of GERD/Schatzki's ring.  History of dysphagia.  Prior EGD demonstrated small hiatal hernia and a Schatzki's ring which was dilated.  He has done well with good control of reflux symptoms on Protonix 40 mg daily.  No recurrent dysphagia.  In fact, no GI symptoms.  Screening colonoscopy 2011-pancolonic diverticulosis.  He continues to be anticoagulated given history of atrial fibrillation/ablation.  Past Medical History:  Diagnosis Date   Allergy    hay fever;  seasonal   Arthritis    right ankle   Cataract    surgery 2016   Chronic kidney disease    stones many years ago   Constipation 12/09/2018   Dizziness 12/13/2015   Dysphagia    Family history of malignant neoplasm of prostate 12/12/2017   Frequent urination at night    GERD (gastroesophageal reflux disease)    History of arthroplasty of right ankle 02/04/2020   History of kidney stones    History of prostate cancer 06/16/2017   HTN (hypertension)    Hyperlipidemia LDL goal <100 10/26/2019   Hypertension    Phreesia 04/24/2020   Hypothyroidism    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 on Tikosyn and Eliquis b. s/p ablation in 10/2019 due to break-through episodes while on Tikosyn.    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 Rosana Hoes   Schatzki's ring 10/22/2011   Seasonal allergies    Syncope  dx. 8 yrs ago   Syncope, cardiogenic 03/20/2017   Vaccine counseling 09/15/2019    Past Surgical History:  Procedure Laterality Date   APPENDECTOMY     ATRIAL FIBRILLATION ABLATION N/A 10/06/2019   Procedure: ATRIAL FIBRILLATION ABLATION;  Surgeon: Constance Haw, MD;  Location: [place] [facility];   Service: Cardiovascular;  Laterality: N/A;   CATARACT EXTRACTION W/PHACO Left 03/27/2015   Procedure: CATARACT EXTRACTION PHACO AND INTRAOCULAR LENS PLACEMENT LEFT EYE CDE=11.73;  Surgeon: Tonny Branch, MD;  [facility];  Service: Ophthalmology;  Laterality: Left;   CATARACT EXTRACTION W/PHACO Right 03/30/2015   Procedure: CATARACT EXTRACTION PHACO AND INTRAOCULAR LENS PLACEMENT RIGHT EYE CDE=7.35;  Surgeon: Tonny Branch, MD;  [facility]: [facility];  Service: Ophthalmology;  Laterality: Right;   CHOLECYSTECTOMY     COLONOSCOPY  07/23/10   Dr. Vivi Ferns rectum, scattered pancolonic diverticula   COLONOSCOPY  08/10/1999   internal hemorrhoids,inflammatory polyp   ESOPHAGOGASTRODUODENOSCOPY  04/12/08   Prominent Schatzki's ring, erosive reflux esophagitis, multiple antral erosions, small hiatal hernia, reactive gastropathy, status post dilation with 65F   ESOPHAGOGASTRODUODENOSCOPY  10/28/2011   Procedure: ESOPHAGOGASTRODUODENOSCOPY (EGD);  Surgeon: Daneil Dolin, MD;  [facility];  Service: Endoscopy;  Laterality: N/A;  1:30   ESOPHAGOGASTRODUODENOSCOPY N/A 09/10/2017   Dr. Gala Romney: Schatkzi's ring s/p dilation at GE junction, mild esophageal reflux esophagitis, medium sized hiatal hernia, normal duodenum. 56 and 58 Fr dilation   EYE SURGERY     JOINT REPLACEMENT N/A    Phreesia 01/15/2020   MALONEY DILATION  10/28/2011   Procedure: Venia Minks DILATION;  Surgeon: Daneil Dolin, MD;  [facility];  Service: Endoscopy;  Laterality: N/A;   MALONEY DILATION  N/A 09/10/2017   Procedure: Venia Minks DILATION;  Surgeon: Daneil Dolin, MD;  [facility];  Service: Endoscopy;  Laterality: N/A;   SAVORY DILATION  10/28/2011   Procedure: SAVORY DILATION;  Surgeon: Daneil Dolin, MD;  [facility];  Service: Endoscopy;  Laterality: N/A;   TONSILLECTOMY     TOTAL ANKLE ARTHROPLASTY Left 11/25/2013   Procedure: TOTAL ANKLE ARTHOPLASTY;  Surgeon: Wylene Simmer, MD;  Location: [place];   Service: Orthopedics;  Laterality: Left;   TOTAL ANKLE ARTHROPLASTY Right 07/14/2014   dr hewitt   TOTAL ANKLE ARTHROPLASTY Right 07/14/2014   Procedure: RIGHT TOTAL ANKLE ARTHOPLASTY;  Surgeon: Wylene Simmer, MD;  Location: [place];  Service: Orthopedics;  Laterality: Right;    Prior to Admission medications   Medication Sig Start Date End Date Taking? Authorizing Provider  amLODipine (NORVASC) 5 MG tablet Take 1 tablet (5 mg total) by mouth daily. Patient taking differently: Take 2.5 mg by mouth daily. 11/16/20  Yes Noreene Larsson, NP  apixaban (ELIQUIS) 5 MG TABS tablet TAKE 1 TABLET BY MOUTH  TWICE DAILY 04/03/21  Yes Fay Records, MD  diphenhydramine-acetaminophen (TYLENOL PM) 25-500 MG TABS tablet Take 1 tablet by mouth at bedtime as needed (sleep and mild discomfort).    Yes [provider]  dofetilide (TIKOSYN) 500 MCG capsule Take 1 capsule (500 mcg total) by mouth 2 (two) times daily. 11/09/20  Yes Camnitz, Will Hassell Done, MD  finasteride (PROSCAR) 5 MG tablet Take 1 tablet by mouth daily. 05/25/20  Yes [provider]  fluticasone (FLONASE) 50 MCG/ACT nasal spray USE 1 SPRAY IN BOTH  NOSTRILS DAILY AS NEEDED  FOR ALLERGIES 10/30/20  Yes Noreene Larsson, NP  gabapentin (NEURONTIN) 300 MG capsule Take 300 mg by mouth 2 (two) times daily.  04/19/15  Yes [provider]  ibuprofen (ADVIL,MOTRIN) 200 MG tablet Take 400 mg by mouth as needed for headache (headache/pain.).    Yes [provider]  levothyroxine (SYNTHROID) 75 MCG tablet TAKE 1 TABLET BY MOUTH  DAILY 08/22/20  Yes [name], NP  loratadine (CLARITIN) 10 MG tablet Take 10 mg by mouth daily as needed for allergies.   Yes [provider]  losartan (COZAAR) 100 MG tablet Take 1 tablet (100 mg total) by mouth daily. 11/09/20  Yes Camnitz, Will Hassell Done, MD  montelukast (SINGULAIR) 10 MG tablet Take 1 tablet (10 mg total) by mouth at bedtime. 08/03/20  Yes Noreene Larsson, NP  Omega-3 Fatty Acids  (RA FISH OIL) 1400 MG CPDR Take 1,400 mg by mouth 2 (two) times daily.   Yes [provider]  pantoprazole (PROTONIX) 40 MG tablet TAKE 1 TABLET BY MOUTH  DAILY BEFORE BREAKFAST 08/09/20  Yes Jodi Mourning, Kristen S, PA-C  Polyethyl Glycol-Propyl Glycol 0.4-0.3 % SOLN Place 1 drop into both eyes 2 (two) times daily as needed (dry eyes).    Yes [provider]  Tamsulosin HCl (FLOMAX) 0.4 MG CAPS Take 0.4 mg by mouth daily after breakfast.    Yes [provider]    Allergies as of 04/24/2021 - Review Complete 04/24/2021  Allergen Reaction Noted   Tetanus toxoids Swelling 05/10/2013   Statins Other (See Comments) 10/12/2012   Dicyclomine Other (See Comments) 12/21/2018   Sulfamethoxazole  09/26/2011   Doxycycline Rash 09/30/2011   Oxycodone Other (See Comments) 07/06/2014   Penicillins Swelling and Rash 09/26/2011   Sulfa antibiotics Swelling and Rash 10/22/2011   Xarelto [rivaroxaban] Rash 06/16/2017  Family History  Problem Relation Age of Onset   Prostate cancer Father    Cancer Father        prostate to bone   Hypertension Father    Prostate cancer Son 85   ALS Son    Prostate cancer Brother    Heart disease Mother        died at 7   Arthritis Mother    Colon cancer Neg Hx     Social History   Socioeconomic History   Marital status: Married    Spouse name: Candace   Number of children: 2   Years of education: Not on file   Highest education level: Bachelor's degree (e.g., BA, AB, BS)  Occupational History   Occupation: retired; Ambulance person: RETIRED  Tobacco Use   Smoking status: Never   Smokeless tobacco: Never  Vaping Use   Vaping Use: Never used  Substance and Sexual Activity   Alcohol use: Yes    Alcohol/week: 2.0 standard drinks    Types: 1 Glasses of wine, 1 Cans of beer per week    Comment: glass of wine or "couple of beers" occasionally    Drug use: No   Sexual activity: Yes    Partners: Female  Other Topics  Concern   Not on file  Social History Narrative   Lives with wife Candace.       Moved from [place], [place]   Retired from [employer].   Plays golf.   Works with [employer] and [employer] for [employer].   Wears seatbelt.   Drives.   Eats all food groups.   Never smoked.   Married for over 66 years June  (2020)   Has two grown children, live in [place] and [place].   Social Determinants of Health   Financial Resource Strain: Low Risk    Difficulty of Paying Living Expenses: Not hard at all  Food Insecurity: No Food Insecurity   Worried About Charity fundraiser in the Last Year: Never true   [place] in the Last Year: Never true  Transportation Needs: No Transportation Needs   Lack of Transportation (Medical): No   Lack of Transportation (Non-Medical): No  Physical Activity: Not on file  Stress: Not on file  Social Connections: Socially Integrated   Frequency of Communication with Friends and Family: More than three times a week   Frequency of Social Gatherings with Friends and Family: More than three times a week   Attends Religious Services: More than 4 times per year   Active Member of [employer] or Organizations: Yes   Attends Music therapist: More than 4 times per year   Marital Status: Married  Human resources officer Violence: Not on file    Review of Systems:  See HPI, otherwise negative ROS  Physical Exam: BP (!) 172/93   Pulse 69   Temp (!) 97.1 F (36.2 C) (Temporal)   Ht 5\' 10"  (1.778 m)   Wt 200 lb 6.4 oz (90.9 kg)   BMI 28.75 kg/m  General:   Alert,  Well-developed, well-nourished, pleasant and cooperative in NAD Mouth:  No deformity or lesions. Neck:  Supple; no masses or thyromegaly. No significant cervical adenopathy. Lungs:  Clear throughout to auscultation.   No wheezes, crackles, or rhonchi. No acute distress. Heart:  Regular rate and rhythm; no murmurs, clicks, rubs,  or gallops. Abdomen: Non-distended, normal bowel sounds.  Soft and  nontender without appreciable mass or  hepatosplenomegaly.  Pulses:  Normal pulses noted. Extremities:  Without clubbing or edema.  Impression/Plan: Very pleasant 80 year old gentleman history of GERD/Schatzki's ring   -  status post EGD with dilation previously.  He appears to have had durable results with esophageal dilation. GERD well-controlled on PPI.  No GI symptoms at this time.  Recommendations:  As discussed, continue Protonix 40 mg once daily indefinitely  No further evaluation here unless new symptoms develop  Patient to call if any future issues with food sticking or other concerns.  GERD information provided.   Notice: This dictation was prepared with Dragon dictation along with smaller phrase technology. Any transcriptional errors that result from this process are unintentional and may not be corrected upon review.

## 2021-05-03 ENCOUNTER — Ambulatory Visit: Payer: Medicare Other | Admitting: Cardiology

## 2021-05-08 DIAGNOSIS — H43393 Other vitreous opacities, bilateral: Secondary | ICD-10-CM | POA: Diagnosis not present

## 2021-05-14 ENCOUNTER — Ambulatory Visit: Payer: Medicare Other | Admitting: Nurse Practitioner

## 2021-05-17 ENCOUNTER — Ambulatory Visit: Payer: Medicare Other | Admitting: Family Medicine

## 2021-05-17 ENCOUNTER — Ambulatory Visit: Payer: Medicare Other | Admitting: Nurse Practitioner

## 2021-06-05 ENCOUNTER — Encounter: Payer: Self-pay | Admitting: Cardiology

## 2021-06-05 ENCOUNTER — Ambulatory Visit: Payer: Medicare Other | Admitting: Cardiology

## 2021-06-05 ENCOUNTER — Other Ambulatory Visit: Payer: Self-pay

## 2021-06-05 VITALS — BP 140/82 | HR 73 | Ht 70.0 in | Wt 202.0 lb

## 2021-06-05 DIAGNOSIS — I4819 Other persistent atrial fibrillation: Secondary | ICD-10-CM | POA: Diagnosis not present

## 2021-06-05 MED ORDER — DOFETILIDE 500 MCG PO CAPS: 500.0000 ug | ORAL_CAPSULE | Freq: Two times a day (BID) | ORAL | 2 refills | Status: DC

## 2021-06-05 MED ORDER — LOSARTAN POTASSIUM 50 MG PO TABS: 50.0000 mg | ORAL_TABLET | Freq: Every day | ORAL | 3 refills | Status: DC

## 2021-06-05 NOTE — Patient Instructions (Signed)
Medication Instructions:  Your physician has recommended you make the following change in your medication:  DECREASE Losartan 50 mg once daily  *If you need a refill on your cardiac medications before your next appointment, please call your pharmacy*   Lab Work: None ordered If you have labs (blood work) drawn today and your tests are completely normal, you will receive your results only by: [place] (if you have MyChart) OR A paper copy in the mail If you have any lab test that is abnormal or we need to change your treatment, we will call you to review the results.   Testing/Procedures: None ordered   Follow-Up: At [facility], you and your health needs are our priority.  As part of our continuing mission to provide you with exceptional heart care, we have created designated Provider Care Teams.  These Care Teams include your primary Cardiologist (physician) and Advanced Practice Providers (APPs -  Physician Assistants and Nurse Practitioners) who all work together to provide you with the care you need, when you need it.  We recommend signing up for the patient portal called "MyChart".  Sign up information is provided on this After Visit Summary.  MyChart is used to connect with patients for Virtual Visits (Telemedicine).  Patients are able to view lab/test results, encounter notes, upcoming appointments, etc.  Non-urgent messages can be sent to your provider as well.   To learn more about what you can do with MyChart, go to [email or web address].    Your next appointment:   6 month(s)  The format for your next appointment:   In Person  Provider:   Allegra Lai, MD    Thank you for choosing [facility]!!   Trinidad Curet, RN [phone]   Other Instructions

## 2021-06-05 NOTE — Progress Notes (Signed)
Electrophysiology Office Note   Date:  06/05/2021   ID:  Brent Wilkins, Brent Wilkins [birth year], MRN [id number]  PCP:  Noreene Larsson, NP  Cardiologist:  Harrington Challenger Primary Electrophysiologist:  Grier Czerwinski [name], MD    No chief complaint on file.    History of Present Illness: Brent Wilkins is a 80 y.o. male who is being seen today for the evaluation of atrial fibrillation at the request of [name], NP. Presenting today for electrophysiology evaluation.    He has a history significant for hypertension, persistent atrial fibrillation, and hyperlipidemia.  He was hospitalized April 2020 with an episode of syncope.  He was found to be bradycardic and thus was not on AV nodal blockers.  At the time of his hospitalization he was found to be orthostatic.  On review of ECG he was noted to have intermittent episodes of sinus rhythm and thus he was admitted for dofetilide load.  He did well but had more frequent episodes of atrial fibrillation and is now status post ablation 10/06/2019.  Today, denies symptoms of palpitations, chest pain, shortness of breath, orthopnea, PND, lower extremity edema, claudication, dizziness, presyncope, syncope, bleeding, or neurologic sequela. The patient is tolerating medications without difficulties.  Since last spring, he has had issues with fatigue.  He wakes up in the mornings and has trouble getting himself going.  By around 12:00, he is able to complete his daily activities.  He has also had a few near syncopal episodes.  These occur when he is standing.  When he is walking he is doing well.  This is happened once when he was on a walk with his wife and stopped to talk to neighbors.  These issues have been occurring since his losartan was increased.  He brings in blood pressure recordings that are generally less than 135/80, though some recordings are in the 150s.  His son has ALS.  He has lost 75 pounds.  He is getting a feeding tube placed after  Thanksgiving.  Past Medical History:  Diagnosis Date   Allergy    hay fever;  seasonal   Arthritis    right ankle   Cataract    surgery 2016   Chronic kidney disease    stones many years ago   Constipation 12/09/2018   Dizziness 12/13/2015   Dysphagia    Family history of malignant neoplasm of prostate 12/12/2017   Frequent urination at night    GERD (gastroesophageal reflux disease)    History of arthroplasty of right ankle 02/04/2020   History of kidney stones    History of prostate cancer 06/16/2017   HTN (hypertension)    Hyperlipidemia LDL goal <100 10/26/2019   Hypertension    Phreesia 04/24/2020   Hypothyroidism    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 on Tikosyn and Eliquis b. s/p ablation in 10/2019 due to break-through episodes while on Tikosyn.    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 Rosana Hoes   Schatzki's ring 10/22/2011   Seasonal allergies    Syncope  dx. 8 yrs ago   Syncope, cardiogenic 03/20/2017   Vaccine counseling 09/15/2019   Past Surgical History:  Procedure Laterality Date   APPENDECTOMY     ATRIAL FIBRILLATION ABLATION N/A 10/06/2019   Procedure: ATRIAL FIBRILLATION ABLATION;  Surgeon: Constance Haw,  MD;  Location: [facility];  Service: Cardiovascular;  Laterality: N/A;   CATARACT EXTRACTION W/PHACO Left 03/27/2015   Procedure: CATARACT EXTRACTION PHACO AND INTRAOCULAR LENS PLACEMENT LEFT EYE CDE=11.73;  Surgeon: Tonny Branch, MD;  Location: [facility];  Service: Ophthalmology;  Laterality: Left;   CATARACT EXTRACTION W/PHACO Right 03/30/2015   Procedure: CATARACT EXTRACTION PHACO AND INTRAOCULAR LENS PLACEMENT RIGHT EYE CDE=7.35;  Surgeon: Tonny Branch, MD;  Location: [facility];  Service: Ophthalmology;  Laterality: Right;   CHOLECYSTECTOMY     COLONOSCOPY  07/23/10   Dr. Vivi Ferns rectum, scattered pancolonic  diverticula   COLONOSCOPY  08/10/1999   internal hemorrhoids,inflammatory polyp   ESOPHAGOGASTRODUODENOSCOPY  04/12/08   Prominent Schatzki's ring, erosive reflux esophagitis, multiple antral erosions, small hiatal hernia, reactive gastropathy, status post dilation with 28F   ESOPHAGOGASTRODUODENOSCOPY  10/28/2011   Procedure: ESOPHAGOGASTRODUODENOSCOPY (EGD);  Surgeon: Daneil Dolin, MD;  Location: [facility];  Service: Endoscopy;  Laterality: N/A;  1:30   ESOPHAGOGASTRODUODENOSCOPY N/A 09/10/2017   Dr. Gala Romney: Schatkzi's ring s/p dilation at GE junction, mild esophageal reflux esophagitis, medium sized hiatal hernia, normal duodenum. 56 and 58 Fr dilation   EYE SURGERY     JOINT REPLACEMENT N/A    Phreesia 01/15/2020   MALONEY DILATION  10/28/2011   Procedure: Venia Minks DILATION;  Surgeon: Daneil Dolin, MD;  [facility];  Service: Endoscopy;  Laterality: N/A;   MALONEY DILATION N/A 09/10/2017   Procedure: Venia Minks DILATION;  Surgeon: Daneil Dolin, MD;  [facility];  Service: Endoscopy;  Laterality: N/A;   SAVORY DILATION  10/28/2011   Procedure: SAVORY DILATION;  Surgeon: Daneil Dolin, MD;  [facility];  Service: Endoscopy;  Laterality: N/A;   TONSILLECTOMY     TOTAL ANKLE ARTHROPLASTY Left 11/25/2013   Procedure: TOTAL ANKLE ARTHOPLASTY;  Surgeon: Wylene Simmer, MD;  Location: [facility];  Service: Orthopedics;  Laterality: Left;   TOTAL ANKLE ARTHROPLASTY Right 07/14/2014   dr hewitt   TOTAL ANKLE ARTHROPLASTY Right 07/14/2014   Procedure: RIGHT TOTAL ANKLE ARTHOPLASTY;  Surgeon: Wylene Simmer, MD;  Location: [facility];  Service: Orthopedics;  Laterality: Right;     Current Outpatient Medications  Medication Sig Dispense Refill   amLODipine (NORVASC) 5 MG tablet Take 1 tablet (5 mg total) by mouth daily. (Patient taking differently: Take 2.5 mg by mouth daily.) 90 tablet 3   apixaban (ELIQUIS) 5 MG TABS tablet TAKE 1 TABLET BY MOUTH  TWICE DAILY 180 tablet 1    diphenhydramine-acetaminophen (TYLENOL PM) 25-500 MG TABS tablet Take 1 tablet by mouth at bedtime as needed (sleep and mild discomfort).      dofetilide (TIKOSYN) 500 MCG capsule Take 1 capsule (500 mcg total) by mouth 2 (two) times daily. 180 capsule 2   finasteride (PROSCAR) 5 MG tablet Take 1 tablet by mouth daily.     fluticasone (FLONASE) 50 MCG/ACT nasal spray USE 1 SPRAY IN BOTH  NOSTRILS DAILY AS NEEDED  FOR ALLERGIES 32 g 2   gabapentin (NEURONTIN) 300 MG capsule Take 300 mg by mouth 2 (two) times daily.      ibuprofen (ADVIL,MOTRIN) 200 MG tablet Take 400 mg by mouth as needed for headache (headache/pain.).      levothyroxine (SYNTHROID) 75 MCG tablet TAKE 1 TABLET BY MOUTH  DAILY 90 tablet 3   loratadine (CLARITIN) 10 MG tablet Take 10 mg by mouth daily as needed for allergies.     losartan (COZAAR) 100 MG tablet Take  1 tablet (100 mg total) by mouth daily. 90 tablet 3   montelukast (SINGULAIR) 10 MG tablet Take 1 tablet (10 mg total) by mouth at bedtime. 30 tablet 3   Omega-3 Fatty Acids (RA FISH OIL) 1400 MG CPDR Take 1,400 mg by mouth 2 (two) times daily.     pantoprazole (PROTONIX) 40 MG tablet TAKE 1 TABLET BY MOUTH  DAILY BEFORE BREAKFAST 90 tablet 3   Polyethyl Glycol-Propyl Glycol 0.4-0.3 % SOLN Place 1 drop into both eyes 2 (two) times daily as needed (dry eyes).      Tamsulosin HCl (FLOMAX) 0.4 MG CAPS Take 0.4 mg by mouth daily after breakfast.      No current facility-administered medications for this visit.    Allergies:   Tetanus toxoids, Statins, Dicyclomine, Sulfamethoxazole, Doxycycline, Oxycodone, Penicillins, Sulfa antibiotics, and Xarelto [rivaroxaban]   Social History:  The patient  reports that he has never smoked. He has never used smokeless tobacco. He reports current alcohol use of about 2.0 standard drinks per week. He reports that he does not use drugs.   Family History:  The patient's family history includes ALS in his son; Arthritis in his mother; Cancer  in his father; Heart disease in his mother; Hypertension in his father; Prostate cancer in his brother and father; Prostate cancer (age of onset: 53) in his son.   ROS:  Please see the history of present illness.   Otherwise, review of systems is positive for none.   All other systems are reviewed and negative.   PHYSICAL EXAM: VS:  BP 140/82   Pulse 73   Ht 5\' 10"  (1.778 m)   Wt 202 lb (91.6 kg)   SpO2 98%   BMI 28.98 kg/m  , BMI Body mass index is 28.98 kg/m. GEN: Well nourished, well developed, in no acute distress  HEENT: normal  Neck: no JVD, carotid bruits, or masses Cardiac: RRR; no murmurs, rubs, or gallops,no edema  Respiratory:  clear to auscultation bilaterally, normal work of breathing GI: soft, nontender, nondistended, + BS MS: no deformity or atrophy  Skin: warm and dry Neuro:  Strength and sensation are intact Psych: euthymic mood, full affect  EKG:  EKG is ordered today. Personal review of the ekg ordered shows sinus rhythm, right bundle branch block, rate 73  Recent Labs: 10/17/2020: TSH 3.500 01/11/2021: ALT 7; Hemoglobin 12.2; Platelets 254 03/26/2021: BUN 15; Creatinine, Ser 1.17; Magnesium 2.1; Potassium 4.3; Sodium 141    Lipid Panel     Component Value Date/Time   CHOL 170 01/11/2021 0847   TRIG 139 01/11/2021 0847   HDL 44 01/11/2021 0847   CHOLHDL 4.7 04/28/2020 0834   CHOLHDL 3.6 01/28/2020 0952   VLDL 30 09/22/2017 1444   LDLCALC 101 (H) 01/11/2021 0847   LDLCALC 104 (H) 01/28/2020 0952     Wt Readings from Last 3 Encounters:  06/05/21 202 lb (91.6 kg)  04/24/21 200 lb 6.4 oz (90.9 kg)  03/26/21 199 lb (90.3 kg)      Other studies Reviewed: Additional studies/ records that were reviewed today include: 30 day monitor 04/28/17 - personally reviewed  Review of the above records today demonstrates:  Atrial fibrillation and flutter seen throughout study. Multiple pauses up to 3 seconds with most occurring during early morning hours  (presumably asleep) and one occurring at approximately 1 pm.  TTE 03/21/17 - Left ventricle: The cavity size was normal. Wall thickness was   increased in a pattern of moderate LVH. Systolic function was  normal. The estimated ejection fraction was in the range of 60%   to 65%. Wall motion was normal; there were no regional wall   motion abnormalities. Doppler parameters are consistent with   restrictive physiology, indicative of decreased left ventricular   diastolic compliance and/or increased left atrial pressure.   Doppler parameters are consistent with high ventricular filling   pressure. - Aortic valve: Moderately calcified annulus. Trileaflet. - Mitral valve: Mildly calcified annulus. - Left atrium: The atrium was severely dilated. - Right ventricle: Systolic function was mildly reduced. - Right atrium: The atrium was mildly to moderately dilated. - Tricuspid valve: There was mild-moderate regurgitation. - Pulmonary arteries: Incomplete TR jet to accurately estimated   PASP.  ASSESSMENT AND PLAN:  1.  Persistent atrial fibrillation/flutter: Currently on Eliquis and dofetilide Eliquis 5 mg twice daily, dofetilide 500 mcg twice daily.  High risk medication monitoring.  CHA2DS2-VASc of 3.  Status post ablation 10/06/2019.  Remains in sinus rhythm with occasional PVCs.  No changes.  2.  Syncope: No episodes since rhythm control  3.  Hypertension: He is having issues with near syncope when he stands still.  He is also quite tired in the mornings.  This is been going on since his losartan was increased.  We Amenah Tucci decrease to 50 mg.  Current medicines are reviewed at length with the patient today.   The patient does not have concerns regarding his medicines.  The following changes were made today: Decrease losartan  Labs/ tests ordered today include:  Orders Placed This Encounter  Procedures   EKG 12-Lead      Disposition:   FU with Jaloni Sorber 6 months  Signed, Trebor Galdamez [name], MD  06/05/2021 2:33 PM     [facility] [address] [place] [address] [phone] (office) [phone] (fax)

## 2021-07-02 ENCOUNTER — Ambulatory Visit: Payer: Medicare Other | Admitting: Nurse Practitioner

## 2021-07-09 DIAGNOSIS — G6289 Other specified polyneuropathies: Secondary | ICD-10-CM | POA: Diagnosis not present

## 2021-08-07 ENCOUNTER — Telehealth: Payer: Self-pay | Admitting: Cardiology

## 2021-08-07 NOTE — Telephone Encounter (Signed)
STAT if patient feels like he/she is going to faint   Are you dizzy now? PT HAS BEEN EXPERIENCING LIGHTHEADED FOR ABOUT 2 WEEKS NOW WHEN HE WAKES UP IN THE MORNINGS  Do you feel faint or have you passed out? NO  Do you have any other symptoms? SOB IF PT WALKS UP AND DOWN STAIRS  Have you checked your HR and BP (record if available)? 161/70 PULSE 70 THIS MORNING    PT'S WIFE SAID A PROVIDER IN THE [facility] OFFICE STOPPED PT'S SYNCOPE MEDS A FEW YEARS AGO, SHE IS NOT AWARE OF WHO THAT PROVIDER IS BUT SHE WANTS TO KNOW IF THE PT SHOULD BE BACK ON THE MEDICINE. WIFE DOES NOT KNOW THE NAME OF THE MEDICINE

## 2021-08-07 NOTE — Telephone Encounter (Signed)
Spoke with patient regarding his call to office due to dizziness that's been worsening since his last vist (approx. 2 mos ago). Pt states that when changing from position of sitting to standing position, he feels sudden onset of dizziness to the point where he must return to sitting. Pt states that the episodes last approx 2 min. Pt denies any syncopal episodes. Pt also states that he is experiencing worsening SOB during daily activities. Pt's BP reading this AM was 161/70 with HR of 70, then took his BP again following episode of dizziness and reading was 126/70 with HR of 70. Pt states that these episodes are occurring with every position change. Pt verified that he is still taking Losartan 50mg  once daily and Amlodipine 5mg  daily (NOT 2.5mg ). Will send to MD for advisement of care. Judson Roch, RN

## 2021-08-09 NOTE — Telephone Encounter (Signed)
Left message to call back  

## 2021-08-09 NOTE — Telephone Encounter (Signed)
Patient is calling back from Tuesday

## 2021-08-09 NOTE — Telephone Encounter (Signed)
Patient was returning call. Please advise ?

## 2021-08-09 NOTE — Telephone Encounter (Signed)
Pt reports he is about the same since last phone call.  BP/HRs the last 3 days: 148/70 126/65 84  125/74 83  113/62 82   Pt scheduled for OV w/ EP APP next week for further discussion/evaluation. Patient verbalized understanding and agreeable to plan.

## 2021-08-12 NOTE — Progress Notes (Signed)
Cardiology Office Note Date:  08/15/2021  Patient ID:  Brent Wilkins, Brent Wilkins 04-18-1941, MRN [id number] PCP:  Noreene Larsson, NP  Cardiologist:  Dr. Harrington Challenger Electrophysiologist: Dr. Curt Bears    Chief Complaint: dizziness, fluctuating BOs  History of Present Illness: Brent Wilkins is a 81 y.o. male with history of HTN, HLD, Afib, RBBB, hypothyroidism, ? Hx of remote syncope and + tilt test  He comes in today to be seen for Dr. Curt Bears, last seen by him 06/05/21, recalls that in April 2020 with an episode of syncope.  He was found to be bradycardic and thus was not on AV nodal blockers.  At the time of his hospitalization he was found to be orthostatic.  He was tired, having standing symptoms of dizziness, more since increased losartan dose.  Noted as well, his son has ALS.  He has lost 75 pounds.  He is getting a feeding tube placed after Thanksgiving His losartan was reduced  Pt/wife calls with reports of dizziness/lightheadedness for a couple weeks, particularly in the AMs,  reports of normal/high BPs followed by lower (ie: 161/70 with HR of 70, then took his BP again following episode of dizziness and reading was 126/70 with HR of 70), SOB with stairs  TODAY He is accompanied by his wife He has a long hx of orthostatic dizziness, though had been pretty well controlled, or resolved for a long time, last year/spring started getting lightheaded when he was standing still.  This has steadily increased to now not being able to stand at church at all He feels lmostly OK as long as he is moving/walking though of late even then sometimes he has to sit. Once seated he quickly starts to feel better. He feels lightheaded, gets an uncomfortable feeling in his abdomen, wife reports he looks very pale and he says near fainting No syncope No symptoms seated or supine No CP, palpitations, or SOB with these events or otherwise  No bleeding or signs of bleeding He was never really aware of his Afib with  palpitations but checks in on h is Kardia device periodically and has not seem any Afib episodes  Afib hx Diagnosed 2015 Xarelto w/rash Tikosyn started 2018 PVI/CTI ablation 10/06/2019  Past Medical History:  Diagnosis Date   Allergy    hay fever;  seasonal   Arthritis    right ankle   Cataract    surgery 2016   Chronic kidney disease    stones many years ago   Constipation 12/09/2018   Dizziness 12/13/2015   Dysphagia    Family history of malignant neoplasm of prostate 12/12/2017   Frequent urination at night    GERD (gastroesophageal reflux disease)    History of arthroplasty of right ankle 02/04/2020   History of kidney stones    History of prostate cancer 06/16/2017   HTN (hypertension)    Hyperlipidemia LDL goal <100 10/26/2019   Hypertension    Phreesia 04/24/2020   Hypothyroidism    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)    a. on Tikosyn and Eliquis b. s/p ablation in 10/2019 due to break-through episodes while on Tikosyn.    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 Rosana Hoes   Schatzki's ring 10/22/2011   Seasonal allergies    Syncope  dx. 8 yrs ago   Syncope, cardiogenic 03/20/2017  Vaccine counseling 09/15/2019    Past Surgical History:  Procedure Laterality Date   APPENDECTOMY     ATRIAL FIBRILLATION ABLATION N/A 10/06/2019   Procedure: ATRIAL FIBRILLATION ABLATION;  Surgeon: Constance Haw, MD;  Location: [place] [facility];  Service: Cardiovascular;  Laterality: N/A;   CATARACT EXTRACTION W/PHACO Left 03/27/2015   Procedure: CATARACT EXTRACTION PHACO AND INTRAOCULAR LENS PLACEMENT LEFT EYE CDE=11.73;  Surgeon: Tonny Branch, MD;  Location: [facility];  Service: Ophthalmology;  Laterality: Left;   CATARACT EXTRACTION W/PHACO Right 03/30/2015   Procedure: CATARACT EXTRACTION PHACO AND INTRAOCULAR LENS PLACEMENT RIGHT EYE CDE=7.35;   Surgeon: Tonny Branch, MD;  Location: [facility];  Service: Ophthalmology;  Laterality: Right;   CHOLECYSTECTOMY     COLONOSCOPY  07/23/10   Dr. Vivi Ferns rectum, scattered pancolonic diverticula   COLONOSCOPY  08/10/1999   internal hemorrhoids,inflammatory polyp   ESOPHAGOGASTRODUODENOSCOPY  04/12/08   Prominent Schatzki's ring, erosive reflux esophagitis, multiple antral erosions, small hiatal hernia, reactive gastropathy, status post dilation with 7F   ESOPHAGOGASTRODUODENOSCOPY  10/28/2011   Procedure: ESOPHAGOGASTRODUODENOSCOPY (EGD);  Surgeon: Daneil Dolin, MD;  Location: [facility];  Service: Endoscopy;  Laterality: N/A;  1:30   ESOPHAGOGASTRODUODENOSCOPY N/A 09/10/2017   Dr. Gala Romney: Schatkzi's ring s/p dilation at GE junction, mild esophageal reflux esophagitis, medium sized hiatal hernia, normal duodenum. 56 and 58 Fr dilation   EYE SURGERY     JOINT REPLACEMENT N/A    Phreesia 01/15/2020   MALONEY DILATION  10/28/2011   Procedure: Venia Minks DILATION;  Surgeon: Daneil Dolin, MD;  [facility];  Service: Endoscopy;  Laterality: N/A;   MALONEY DILATION N/A 09/10/2017   Procedure: Venia Minks DILATION;  Surgeon: Daneil Dolin, MD;  [facility];  Service: Endoscopy;  Laterality: N/A;   SAVORY DILATION  10/28/2011   Procedure: SAVORY DILATION;  Surgeon: Daneil Dolin, MD;  [facility];  Service: Endoscopy;  Laterality: N/A;   TONSILLECTOMY     TOTAL ANKLE ARTHROPLASTY Left 11/25/2013   Procedure: TOTAL ANKLE ARTHOPLASTY;  Surgeon: Wylene Simmer, MD;  Location: [facility];  Service: Orthopedics;  Laterality: Left;   TOTAL ANKLE ARTHROPLASTY Right 07/14/2014   dr hewitt   TOTAL ANKLE ARTHROPLASTY Right 07/14/2014   Procedure: RIGHT TOTAL ANKLE ARTHOPLASTY;  Surgeon: Wylene Simmer, MD;  Location: [facility];  Service: Orthopedics;  Laterality: Right;    Current Outpatient Medications  Medication Sig Dispense Refill   amLODipine (NORVASC) 5 MG tablet Take 1 tablet (5 mg  total) by mouth daily. 90 tablet 3   apixaban (ELIQUIS) 5 MG TABS tablet TAKE 1 TABLET BY MOUTH  TWICE DAILY 180 tablet 1   diphenhydramine-acetaminophen (TYLENOL PM) 25-500 MG TABS tablet Take 1 tablet by mouth at bedtime as needed (sleep and mild discomfort).      dofetilide (TIKOSYN) 500 MCG capsule Take 1 capsule (500 mcg total) by mouth 2 (two) times daily. 180 capsule 2   finasteride (PROSCAR) 5 MG tablet Take 1 tablet by mouth daily.     fluticasone (FLONASE) 50 MCG/ACT nasal spray USE 1 SPRAY IN BOTH  NOSTRILS DAILY AS NEEDED  FOR ALLERGIES 32 g 2   gabapentin (NEURONTIN) 300 MG capsule Take 300 mg by mouth 2 (two) times daily.      ibuprofen (ADVIL,MOTRIN) 200 MG tablet Take 400 mg by mouth as needed for headache (headache/pain.).      levothyroxine (SYNTHROID) 75 MCG tablet TAKE 1 TABLET BY MOUTH  DAILY 90 tablet 3  loratadine (CLARITIN) 10 MG tablet Take 10 mg by mouth daily as needed for allergies.     losartan (COZAAR) 50 MG tablet Take 1 tablet (50 mg total) by mouth daily. 90 tablet 3   montelukast (SINGULAIR) 10 MG tablet Take 1 tablet (10 mg total) by mouth at bedtime. 30 tablet 3   Omega-3 Fatty Acids (RA FISH OIL) 1400 MG CPDR Take 1,400 mg by mouth 2 (two) times daily.     pantoprazole (PROTONIX) 40 MG tablet TAKE 1 TABLET BY MOUTH  DAILY BEFORE BREAKFAST 90 tablet 3   Polyethyl Glycol-Propyl Glycol 0.4-0.3 % SOLN Place 1 drop into both eyes 2 (two) times daily as needed (dry eyes).      Tamsulosin HCl (FLOMAX) 0.4 MG CAPS Take 0.4 mg by mouth daily after breakfast.      No current facility-administered medications for this visit.    Allergies:   Tetanus toxoids, Statins, Dicyclomine, Sulfamethoxazole, Doxycycline, Oxycodone, Penicillins, Sulfa antibiotics, and Xarelto [rivaroxaban]   Social History:  The patient  reports that he has never smoked. He has never used smokeless tobacco. He reports current alcohol use of about 2.0 standard drinks per week. He reports that he  does not use drugs.   Family History:  The patient's family history includes ALS in his son; Arthritis in his mother; Cancer in his father; Heart disease in his mother; Hypertension in his father; Prostate cancer in his brother and father; Prostate cancer (age of onset: 85) in his son.  ROS:  Please see the history of present illness.    All other systems are reviewed and otherwise negative.   PHYSICAL EXAM:  VS:  BP 136/72    Pulse 84    Ht 5\' 10"  (1.778 m)    Wt 197 lb (89.4 kg)    SpO2 95%    BMI 28.27 kg/m  BMI: Body mass index is 28.27 kg/m. Well nourished, well developed, in no acute distress HEENT: normocephalic, atraumatic Neck: no JVD, carotid bruits or masses Cardiac:  RRR; no significant murmurs, no rubs, or gallops Lungs:  CTA b/l, no wheezing, rhonchi or rales Abd: soft, nontender MS: no deformity, age appropriate atrophy Ext: no edema Skin: warm and dry, no rash Neuro:  No gross deficits appreciated Psych: euthymic mood, full affect    EKG:  Done today and reviewed by myself shows  SR 84bpm, RBBB (old), stable QTc, 462ms (with QRS 183ms)   07/07/2020: TTE IMPRESSIONS   1. Left ventricular ejection fraction, by estimation, is >75%. The left  ventricle has normal function. The left ventricle has no regional wall  motion abnormalities. There is mild left ventricular hypertrophy. Left  ventricular diastolic parameters are  consistent with Grade II diastolic dysfunction (pseudonormalization).  Elevated left atrial pressure.   2. Right ventricular systolic function is normal. The right ventricular  size is normal.   3. Left atrial size was severely dilated.   4. Right atrial size was mildly dilated.   5. The mitral valve is normal in structure. No evidence of mitral valve  regurgitation. No evidence of mitral stenosis.   6. The aortic valve is tricuspid. Aortic valve regurgitation is not  visualized. No aortic stenosis is present.   7. The inferior vena cava is  normal in size with greater than 50%  respiratory variability, suggesting right atrial pressure of 3 mmHg.   10/06/2019: EPS/Ablation CONCLUSIONS: 1. Atrial fibrillation upon presentation.   2. Successful electrical isolation and anatomical encircling of all four pulmonary veins  with radiofrequency current.  A WACA approach was used 3. Additional left atrial ablation was performed with a standard box lesion created along the posterior wall of the left atrium 4. Ablation of the cavotricuspid isthmus due to typical atrial flutter 5. Atrial fibrillation successfully cardioverted to sinus rhythm. 6. No early apparent complications.   June 2020: Monitor SInus rhythm and atrial fibrillation   41 to 132 bpm  Average HR 69 bpm Pt in atrial fibrillation about 30% of time Longest pause 3 sec at 4:40 AM      Recent Labs: 10/17/2020: TSH 3.500 01/11/2021: ALT 7; Hemoglobin 12.2; Platelets 254 03/26/2021: BUN 15; Creatinine, Ser 1.17; Magnesium 2.1; Potassium 4.3; Sodium 141  01/11/2021: Cholesterol, Total 170; HDL 44; LDL Chol Calc (NIH) 101; Triglycerides 139   CrCl cannot be calculated (Patient's most recent lab result is older than the maximum 21 days allowed.).   Wt Readings from Last 3 Encounters:  08/15/21 197 lb (89.4 kg)  06/05/21 202 lb (91.6 kg)  04/24/21 200 lb 6.4 oz (90.9 kg)     Other studies reviewed: Additional studies/records reviewed today include: summarized above  ASSESSMENT AND PLAN:  Persistent Afib CHA2DS2Vasc is 3, on Eliquis, appropriately dosed Tikosyn with stable QTc Low/no burden by symptoms/Kardia checks by the patient Labs today  HTN Orthostatic/standing dizziness Remote hx of + tilt test Near syncope goes back many years Hx of syncope and bradycardia noted BB stopped a couple years ago Classic orthostatic symptoms  Marked BP drop today with orthostatics, HR blunted as well + reproduction of symptoms STOP amlodipine We discussed at length this issue Am  inclined to allow some degree of elevated BPs with him compressive wear Symptom recognition and safety They report he is good about water/hydration    Disposition: F/u with [place] in a few weeks, sooner if needed  Current medicines are reviewed at length with the patient today.  The patient did not have any concerns regarding medicines.  Venetia Night, PA-C 08/15/2021 8:10 AM     [facility] [address] [place] [address] [phone] (office)  [phone] (fax)

## 2021-08-15 ENCOUNTER — Other Ambulatory Visit: Payer: Self-pay

## 2021-08-15 ENCOUNTER — Ambulatory Visit: Payer: Medicare Other | Admitting: Physician Assistant

## 2021-08-15 ENCOUNTER — Encounter: Payer: Self-pay | Admitting: Physician Assistant

## 2021-08-15 VITALS — BP 136/72 | HR 84 | Ht 70.0 in | Wt 197.0 lb

## 2021-08-15 DIAGNOSIS — Z5181 Encounter for therapeutic drug level monitoring: Secondary | ICD-10-CM | POA: Diagnosis not present

## 2021-08-15 DIAGNOSIS — I4819 Other persistent atrial fibrillation: Secondary | ICD-10-CM | POA: Diagnosis not present

## 2021-08-15 DIAGNOSIS — I10 Essential (primary) hypertension: Secondary | ICD-10-CM | POA: Diagnosis not present

## 2021-08-15 DIAGNOSIS — I951 Orthostatic hypotension: Secondary | ICD-10-CM | POA: Diagnosis not present

## 2021-08-15 DIAGNOSIS — Z79899 Other long term (current) drug therapy: Secondary | ICD-10-CM

## 2021-08-15 LAB — BASIC METABOLIC PANEL
BUN/Creatinine Ratio: 15 (ref 10–24)
BUN: 19 mg/dL (ref 8–27)
CO2: 21 mmol/L (ref 20–29)
Calcium: 9.2 mg/dL (ref 8.6–10.2)
Chloride: 106 mmol/L (ref 96–106)
Creatinine, Ser: 1.27 mg/dL (ref 0.76–1.27)
Glucose: 108 mg/dL — ABNORMAL HIGH (ref 70–99)
Potassium: 4.5 mmol/L (ref 3.5–5.2)
Sodium: 139 mmol/L (ref 134–144)
eGFR: 56 mL/min/{1.73_m2} — ABNORMAL LOW (ref >59)

## 2021-08-15 LAB — CBC
Hematocrit: 26.1 % — ABNORMAL LOW (ref 37.5–51.0)
Hemoglobin: 7.6 g/dL — ABNORMAL LOW (ref 13.0–17.7)
MCH: 22.5 pg — ABNORMAL LOW (ref 26.6–33.0)
MCHC: 29.1 g/dL — ABNORMAL LOW (ref 31.5–35.7)
MCV: 77 fL — ABNORMAL LOW (ref 79–97)
Platelets: 466 10*3/uL — ABNORMAL HIGH (ref 150–450)
RBC: 3.38 x10E6/uL — ABNORMAL LOW (ref 4.14–5.80)
RDW: 15.8 % — ABNORMAL HIGH (ref 11.6–15.4)
WBC: 9.6 10*3/uL (ref 3.4–10.8)

## 2021-08-15 LAB — MAGNESIUM: Magnesium: 2.2 mg/dL (ref 1.6–2.3)

## 2021-08-15 NOTE — Patient Instructions (Addendum)
Medication Instructions:   STOP TAKING AMLODIPINE  5 MG ONCE A DAY   *If you need a refill on your cardiac medications before your next appointment, please call your pharmacy*   Lab Work: BMET MAG  AND CBC TODAY    If you have labs (blood work) drawn today and your tests are completely normal, you will receive your results only by: Axis (if you have MyChart) OR A paper copy in the mail If you have any lab test that is abnormal or we need to change your treatment, we will call you to review the results.   Testing/Procedures: NONE ORDERED  TODAY'     Follow-Up: At [facility], you and your health needs are our priority.  As part of our continuing mission to provide you with exceptional heart care, we have created designated Provider Care Teams.  These Care Teams include your primary Cardiologist (physician) and Advanced Practice Providers (APPs -  Physician Assistants and Nurse Practitioners) who all work together to provide you with the care you need, when you need it.  We recommend signing up for the patient portal called "MyChart".  Sign up information is provided on this After Visit Summary.  MyChart is used to connect with patients for Virtual Visits (Telemedicine).  Patients are able to view lab/test results, encounter notes, upcoming appointments, etc.  Non-urgent messages can be sent to your provider as well.   To learn more about what you can do with MyChart, go to [email or web address].    Your next appointment:   3-4  week(s)  The format for your next appointment:   In Person  Provider:   Tommye Standard, PA-C    Other Instructions   PLEASE DO NOT STAND FOR LONG PERIODS  MAKES SURE YOU TAKE A SEAT  ANY TIME  IF  LIGHTHEADEDNESS OR [employer]

## 2021-08-16 ENCOUNTER — Other Ambulatory Visit: Payer: Self-pay

## 2021-08-16 ENCOUNTER — Inpatient Hospital Stay (HOSPITAL_COMMUNITY)
Admission: EM | Admit: 2021-08-16 | Discharge: 2021-08-18 | DRG: 378 | Disposition: A | Discharge status: Home / Self Care | Payer: Medicare Other | Attending: Internal Medicine | Admitting: Internal Medicine

## 2021-08-16 ENCOUNTER — Telehealth: Payer: Self-pay | Admitting: *Deleted

## 2021-08-16 ENCOUNTER — Telehealth: Payer: Self-pay | Admitting: Physician Assistant

## 2021-08-16 ENCOUNTER — Emergency Department (HOSPITAL_COMMUNITY): Payer: Medicare Other

## 2021-08-16 ENCOUNTER — Encounter (HOSPITAL_COMMUNITY): Payer: Self-pay | Admitting: Emergency Medicine

## 2021-08-16 DIAGNOSIS — Z79899 Other long term (current) drug therapy: Secondary | ICD-10-CM

## 2021-08-16 DIAGNOSIS — Z8249 Family history of ischemic heart disease and other diseases of the circulatory system: Secondary | ICD-10-CM

## 2021-08-16 DIAGNOSIS — E785 Hyperlipidemia, unspecified: Secondary | ICD-10-CM | POA: Diagnosis present

## 2021-08-16 DIAGNOSIS — I4510 Unspecified right bundle-branch block: Secondary | ICD-10-CM | POA: Diagnosis not present

## 2021-08-16 DIAGNOSIS — K31811 Angiodysplasia of stomach and duodenum with bleeding: Principal | ICD-10-CM | POA: Diagnosis present

## 2021-08-16 DIAGNOSIS — I4819 Other persistent atrial fibrillation: Secondary | ICD-10-CM

## 2021-08-16 DIAGNOSIS — N1831 Chronic kidney disease, stage 3a: Secondary | ICD-10-CM | POA: Diagnosis present

## 2021-08-16 DIAGNOSIS — K2100 Gastro-esophageal reflux disease with esophagitis, without bleeding: Secondary | ICD-10-CM | POA: Diagnosis present

## 2021-08-16 DIAGNOSIS — D62 Acute posthemorrhagic anemia: Secondary | ICD-10-CM | POA: Diagnosis not present

## 2021-08-16 DIAGNOSIS — R0602 Shortness of breath: Secondary | ICD-10-CM | POA: Diagnosis not present

## 2021-08-16 DIAGNOSIS — G629 Polyneuropathy, unspecified: Secondary | ICD-10-CM | POA: Diagnosis present

## 2021-08-16 DIAGNOSIS — I129 Hypertensive chronic kidney disease with stage 1 through stage 4 chronic kidney disease, or unspecified chronic kidney disease: Secondary | ICD-10-CM | POA: Diagnosis present

## 2021-08-16 DIAGNOSIS — Z96661 Presence of right artificial ankle joint: Secondary | ICD-10-CM | POA: Diagnosis not present

## 2021-08-16 DIAGNOSIS — Z887 Allergy status to serum and vaccine status: Secondary | ICD-10-CM | POA: Diagnosis not present

## 2021-08-16 DIAGNOSIS — Z79890 Hormone replacement therapy (postmenopausal): Secondary | ICD-10-CM | POA: Diagnosis not present

## 2021-08-16 DIAGNOSIS — Z8546 Personal history of malignant neoplasm of prostate: Secondary | ICD-10-CM

## 2021-08-16 DIAGNOSIS — D649 Anemia, unspecified: Secondary | ICD-10-CM

## 2021-08-16 DIAGNOSIS — Z20822 Contact with and (suspected) exposure to covid-19: Secondary | ICD-10-CM | POA: Diagnosis present

## 2021-08-16 DIAGNOSIS — N400 Enlarged prostate without lower urinary tract symptoms: Secondary | ICD-10-CM | POA: Diagnosis present

## 2021-08-16 DIAGNOSIS — K921 Melena: Secondary | ICD-10-CM

## 2021-08-16 DIAGNOSIS — I4891 Unspecified atrial fibrillation: Secondary | ICD-10-CM | POA: Diagnosis present

## 2021-08-16 DIAGNOSIS — Z888 Allergy status to other drugs, medicaments and biological substances status: Secondary | ICD-10-CM | POA: Diagnosis not present

## 2021-08-16 DIAGNOSIS — E039 Hypothyroidism, unspecified: Secondary | ICD-10-CM | POA: Diagnosis not present

## 2021-08-16 DIAGNOSIS — Z7901 Long term (current) use of anticoagulants: Secondary | ICD-10-CM

## 2021-08-16 DIAGNOSIS — K922 Gastrointestinal hemorrhage, unspecified: Secondary | ICD-10-CM

## 2021-08-16 DIAGNOSIS — Z8261 Family history of arthritis: Secondary | ICD-10-CM | POA: Diagnosis not present

## 2021-08-16 DIAGNOSIS — Z880 Allergy status to penicillin: Secondary | ICD-10-CM | POA: Diagnosis not present

## 2021-08-16 DIAGNOSIS — K449 Diaphragmatic hernia without obstruction or gangrene: Secondary | ICD-10-CM | POA: Diagnosis present

## 2021-08-16 DIAGNOSIS — R079 Chest pain, unspecified: Secondary | ICD-10-CM | POA: Diagnosis not present

## 2021-08-16 DIAGNOSIS — Z882 Allergy status to sulfonamides status: Secondary | ICD-10-CM

## 2021-08-16 DIAGNOSIS — Z96662 Presence of left artificial ankle joint: Secondary | ICD-10-CM | POA: Diagnosis present

## 2021-08-16 DIAGNOSIS — Z8042 Family history of malignant neoplasm of prostate: Secondary | ICD-10-CM | POA: Diagnosis not present

## 2021-08-16 DIAGNOSIS — K31819 Angiodysplasia of stomach and duodenum without bleeding: Secondary | ICD-10-CM | POA: Diagnosis not present

## 2021-08-16 HISTORY — DX: Acute posthemorrhagic anemia: D62

## 2021-08-16 LAB — CBC WITH DIFFERENTIAL/PLATELET
Abs Immature Granulocytes: 0.04 10*3/uL (ref 0.00–0.07)
Basophils Absolute: 0 10*3/uL (ref 0.0–0.1)
Basophils Relative: 0 %
Eosinophils Absolute: 0.2 10*3/uL (ref 0.0–0.5)
Eosinophils Relative: 2 %
HCT: 24.8 % — ABNORMAL LOW (ref 39.0–52.0)
Hemoglobin: 7.2 g/dL — ABNORMAL LOW (ref 13.0–17.0)
Immature Granulocytes: 1 %
Lymphocytes Relative: 19 %
Lymphs Abs: 1.6 10*3/uL (ref 0.7–4.0)
MCH: 23.4 pg — ABNORMAL LOW (ref 26.0–34.0)
MCHC: 29 g/dL — ABNORMAL LOW (ref 30.0–36.0)
MCV: 80.5 fL (ref 80.0–100.0)
Monocytes Absolute: 0.6 10*3/uL (ref 0.1–1.0)
Monocytes Relative: 6 %
Neutro Abs: 6.3 10*3/uL (ref 1.7–7.7)
Neutrophils Relative %: 72 %
Platelets: 400 10*3/uL (ref 150–400)
RBC: 3.08 MIL/uL — ABNORMAL LOW (ref 4.22–5.81)
RDW: 16 % — ABNORMAL HIGH (ref 11.5–15.5)
WBC: 8.7 10*3/uL (ref 4.0–10.5)
nRBC: 0 % (ref 0.0–0.2)

## 2021-08-16 LAB — COMPREHENSIVE METABOLIC PANEL
ALT: 7 U/L (ref 0–44)
AST: 17 U/L (ref 15–41)
Albumin: 3.3 g/dL — ABNORMAL LOW (ref 3.5–5.0)
Alkaline Phosphatase: 60 U/L (ref 38–126)
Anion gap: 7 (ref 5–15)
BUN: 20 mg/dL (ref 8–23)
CO2: 24 mmol/L (ref 22–32)
Calcium: 9 mg/dL (ref 8.9–10.3)
Chloride: 110 mmol/L (ref 98–111)
Creatinine, Ser: 1.26 mg/dL — ABNORMAL HIGH (ref 0.61–1.24)
GFR, Estimated: 57 mL/min — ABNORMAL LOW (ref >60)
Glucose, Bld: 118 mg/dL — ABNORMAL HIGH (ref 70–99)
Potassium: 4.3 mmol/L (ref 3.5–5.1)
Sodium: 141 mmol/L (ref 135–145)
Total Bilirubin: 0.5 mg/dL (ref 0.3–1.2)
Total Protein: 6.3 g/dL — ABNORMAL LOW (ref 6.5–8.1)

## 2021-08-16 LAB — PREPARE RBC (CROSSMATCH)

## 2021-08-16 LAB — IRON AND TIBC
Iron: 12 ug/dL — ABNORMAL LOW (ref 45–182)
Saturation Ratios: 3 % — ABNORMAL LOW (ref 17.9–39.5)
TIBC: 405 ug/dL (ref 250–450)
UIBC: 393 ug/dL

## 2021-08-16 LAB — TROPONIN I (HIGH SENSITIVITY)
Troponin I (High Sensitivity): 6 ng/L (ref <18)
Troponin I (High Sensitivity): 6 ng/L (ref <18)

## 2021-08-16 LAB — RESP PANEL BY RT-PCR (FLU A&B, COVID) ARPGX2
Influenza A by PCR: NEGATIVE
Influenza B by PCR: NEGATIVE
SARS Coronavirus 2 by RT PCR: NEGATIVE

## 2021-08-16 LAB — VITAMIN B12: Vitamin B-12: 3057 pg/mL — ABNORMAL HIGH (ref 180–914)

## 2021-08-16 LAB — PROTIME-INR
INR: 1.2 (ref 0.8–1.2)
Prothrombin Time: 15.4 seconds — ABNORMAL HIGH (ref 11.4–15.2)

## 2021-08-16 LAB — FERRITIN: Ferritin: 9 ng/mL — ABNORMAL LOW (ref 24–336)

## 2021-08-16 LAB — ABO/RH: ABO/RH(D): O POS

## 2021-08-16 LAB — FOLATE: Folate: 21.2 ng/mL (ref >5.9)

## 2021-08-16 MED ORDER — LOSARTAN POTASSIUM 50 MG PO TABS
50.0000 mg | ORAL_TABLET | Freq: Every day | ORAL | Status: DC
  Administered 2021-08-17 – 2021-08-18 (×2): 50 mg via ORAL
  Filled 2021-08-16 (×2): qty 1

## 2021-08-16 MED ORDER — SODIUM CHLORIDE 0.9 % IV SOLN: INTRAVENOUS | Status: DC

## 2021-08-16 MED ORDER — BOOST / RESOURCE BREEZE PO LIQD CUSTOM
1.0000 | Freq: Three times a day (TID) | ORAL | Status: DC
  Administered 2021-08-17: 1 via ORAL

## 2021-08-16 MED ORDER — FINASTERIDE 5 MG PO TABS
5.0000 mg | ORAL_TABLET | Freq: Every day | ORAL | Status: DC
  Administered 2021-08-17 – 2021-08-18 (×2): 5 mg via ORAL
  Filled 2021-08-16 (×2): qty 1

## 2021-08-16 MED ORDER — GABAPENTIN 300 MG PO CAPS
300.0000 mg | ORAL_CAPSULE | Freq: Two times a day (BID) | ORAL | Status: DC
Start: 2021-08-16 — End: 2021-08-18
  Administered 2021-08-16 – 2021-08-18 (×3): 300 mg via ORAL
  Filled 2021-08-16 (×4): qty 1

## 2021-08-16 MED ORDER — ACETAMINOPHEN 325 MG PO TABS
650.0000 mg | ORAL_TABLET | Freq: Four times a day (QID) | ORAL | Status: DC | PRN
  Administered 2021-08-17 (×2): 650 mg via ORAL
  Filled 2021-08-16 (×2): qty 2

## 2021-08-16 MED ORDER — ONDANSETRON HCL 4 MG PO TABS: 4.0000 mg | ORAL_TABLET | Freq: Four times a day (QID) | ORAL | Status: DC | PRN

## 2021-08-16 MED ORDER — PANTOPRAZOLE 80MG IVPB - SIMPLE MED
80.0000 mg | Freq: Once | INTRAVENOUS | Status: AC
  Administered 2021-08-16: 80 mg via INTRAVENOUS
  Filled 2021-08-16: qty 100

## 2021-08-16 MED ORDER — PANTOPRAZOLE INFUSION (NEW) - SIMPLE MED
8.0000 mg/h | INTRAVENOUS | Status: DC
  Administered 2021-08-16 – 2021-08-18 (×6): 8 mg/h via INTRAVENOUS
  Filled 2021-08-16: qty 100
  Filled 2021-08-16 (×2): qty 80
  Filled 2021-08-16: qty 100
  Filled 2021-08-16: qty 80
  Filled 2021-08-16 (×3): qty 100

## 2021-08-16 MED ORDER — MONTELUKAST SODIUM 10 MG PO TABS
10.0000 mg | ORAL_TABLET | Freq: Every day | ORAL | Status: DC
Start: 2021-08-16 — End: 2021-08-18
  Administered 2021-08-16: 10 mg via ORAL
  Filled 2021-08-16 (×2): qty 1

## 2021-08-16 MED ORDER — DOFETILIDE 500 MCG PO CAPS
500.0000 ug | ORAL_CAPSULE | Freq: Two times a day (BID) | ORAL | Status: DC
  Administered 2021-08-16 – 2021-08-18 (×4): 500 ug via ORAL
  Filled 2021-08-16 (×4): qty 1

## 2021-08-16 MED ORDER — LORATADINE 10 MG PO TABS: 10.0000 mg | ORAL_TABLET | Freq: Every day | ORAL | Status: DC | PRN

## 2021-08-16 MED ORDER — FLUTICASONE PROPIONATE 50 MCG/ACT NA SUSP
2.0000 | Freq: Every day | NASAL | Status: DC
  Filled 2021-08-16: qty 16

## 2021-08-16 MED ORDER — SODIUM CHLORIDE 0.9 % IV SOLN: 10.0000 mL/h | Freq: Once | INTRAVENOUS | Status: DC

## 2021-08-16 MED ORDER — OMEGA-3-ACID ETHYL ESTERS 1 G PO CAPS
1000.0000 mg | ORAL_CAPSULE | Freq: Two times a day (BID) | ORAL | Status: DC
  Administered 2021-08-16 – 2021-08-18 (×3): 1000 mg via ORAL
  Filled 2021-08-16 (×4): qty 1

## 2021-08-16 MED ORDER — LEVOTHYROXINE SODIUM 75 MCG PO TABS
75.0000 ug | ORAL_TABLET | Freq: Every day | ORAL | Status: DC
  Administered 2021-08-18: 75 ug via ORAL
  Filled 2021-08-16 (×2): qty 1

## 2021-08-16 MED ORDER — ACETAMINOPHEN 650 MG RE SUPP: 650.0000 mg | Freq: Four times a day (QID) | RECTAL | Status: DC | PRN

## 2021-08-16 MED ORDER — TAMSULOSIN HCL 0.4 MG PO CAPS
0.4000 mg | ORAL_CAPSULE | Freq: Every day | ORAL | Status: DC
  Administered 2021-08-17 – 2021-08-18 (×2): 0.4 mg via ORAL
  Filled 2021-08-16 (×3): qty 1

## 2021-08-16 MED ORDER — ONDANSETRON HCL 4 MG/2ML IJ SOLN: 4.0000 mg | Freq: Four times a day (QID) | INTRAMUSCULAR | Status: DC | PRN

## 2021-08-16 NOTE — ED Provider Notes (Signed)
[facility] EMERGENCY DEPARTMENT Provider Note   CSN: [id number] Arrival date & time: 08/16/21  0820     History  Chief Complaint  Patient presents with   Dizziness    Pt says he is weak and lightheaded     KEIMON BASALDUA is a 81 y.o. male.  Pt is an 81 yo wm with a hx of afib on eliquis, htn, hypothyroidism, bph, and gerd.  He presents to the ED today with weakness, sob, and dizziness with standing.  Pt has had these sx for a few months.  He made an appt with cardiology yesterday.  He was found to be orthostatic, so he was told to stop his amlodipine.  They did blood work yesterday which showed a hemoglobin of 7.6.  His last hgb was 12.2 on 01/11/21.  He denies having problems with anemia.  He has not seen any blood in his stool.  Cardiology told pt to go to the ED for further eval.  He did hold his Eliquis this am.  Last dose was last evening.   CHA2DS2/VAS Stroke Risk Points  Current as of just now     3 >= 2 Points: High Risk  1 - 1.99 Points: Medium Risk  0 Points: Low Risk    Last Change: N/A      Details    This score determines the patient's risk of having a stroke if the  patient has atrial fibrillation.       Points Metrics  0 Has Congestive Heart Failure:  No    Current as of just now  0 Has Vascular Disease:  No    Current as of just now  1 Has Hypertension:  Yes    Current as of just now  2 Age:  54    Current as of just now  0 Has Diabetes:  No    Current as of just now  0 Had Stroke:  No  Had TIA:  No  Had Thromboembolism:  No    Current as of just now  0 Male:  No    Current as of just now                Home Medications Prior to Admission medications   Medication Sig Start Date End Date Taking? Authorizing Provider  apixaban (ELIQUIS) 5 MG TABS tablet TAKE 1 TABLET BY MOUTH  TWICE DAILY 04/03/21  Yes Fay Records, MD  diphenhydramine-acetaminophen (TYLENOL PM) 25-500 MG TABS tablet Take 1 tablet by mouth at bedtime as needed (sleep and  mild discomfort).    Yes [provider]  dofetilide (TIKOSYN) 500 MCG capsule Take 1 capsule (500 mcg total) by mouth 2 (two) times daily. 06/05/21  Yes Camnitz, Will Hassell Done, MD  finasteride (PROSCAR) 5 MG tablet Take 1 tablet by mouth daily. 05/25/20  Yes [provider]  fluticasone (FLONASE) 50 MCG/ACT nasal spray USE 1 SPRAY IN BOTH  NOSTRILS DAILY AS NEEDED  FOR ALLERGIES 10/30/20  Yes Noreene Larsson, NP  gabapentin (NEURONTIN) 300 MG capsule Take 300 mg by mouth 2 (two) times daily.  04/19/15  Yes [provider]  ibuprofen (ADVIL,MOTRIN) 200 MG tablet Take 400 mg by mouth as needed for headache (headache/pain.).    Yes [provider]  levothyroxine (SYNTHROID) 75 MCG tablet TAKE 1 TABLET BY MOUTH  DAILY 08/22/20  Yes [name], NP  loratadine (CLARITIN) 10 MG tablet Take 10 mg by mouth daily as needed for allergies.  Yes [provider]  losartan (COZAAR) 50 MG tablet Take 1 tablet (50 mg total) by mouth daily. 06/05/21 09/03/21 Yes Camnitz, Will Hassell Done, MD  montelukast (SINGULAIR) 10 MG tablet Take 1 tablet (10 mg total) by mouth at bedtime. 08/03/20  Yes Noreene Larsson, NP  Omega-3 Fatty Acids (RA FISH OIL) 1400 MG CPDR Take 1,400 mg by mouth 2 (two) times daily.   Yes [provider]  pantoprazole (PROTONIX) 40 MG tablet TAKE 1 TABLET BY MOUTH  DAILY BEFORE BREAKFAST 08/09/20  Yes Jodi Mourning, Kristen S, PA-C  Polyethyl Glycol-Propyl Glycol 0.4-0.3 % SOLN Place 1 drop into both eyes 2 (two) times daily as needed (dry eyes).    Yes [provider]  Tamsulosin HCl (FLOMAX) 0.4 MG CAPS Take 0.4 mg by mouth daily after breakfast.    Yes [provider]      Allergies    Tetanus toxoids, Statins, Dicyclomine, Sulfamethoxazole, Doxycycline, Oxycodone, Penicillins, Sulfa antibiotics, and Xarelto [rivaroxaban]    Review of Systems   Review of Systems  Respiratory:  Positive for shortness of breath.   Neurological:  Positive  for weakness.  All other systems reviewed and are negative.  Physical Exam Updated Vital Signs BP 139/79    Pulse 60    Temp 98.3 F (36.8 C) (Oral)    Resp 14    Ht 5\' 10"  (1.778 m)    Wt 87.8 kg    SpO2 97%    BMI 27.76 kg/m  Physical Exam Vitals and nursing note reviewed.  Constitutional:      Appearance: Normal appearance.  HENT:     Head: Normocephalic and atraumatic.     Right Ear: External ear normal.     Left Ear: External ear normal.     Nose: Nose normal.     Mouth/Throat:     Mouth: Mucous membranes are moist.     Pharynx: Oropharynx is clear.  Eyes:     Extraocular Movements: Extraocular movements intact.     Conjunctiva/sclera: Conjunctivae normal.     Pupils: Pupils are equal, round, and reactive to light.  Cardiovascular:     Rate and Rhythm: Normal rate and regular rhythm.     Pulses: Normal pulses.     Heart sounds: Normal heart sounds.  Pulmonary:     Effort: Pulmonary effort is normal.     Breath sounds: Normal breath sounds.  Abdominal:     General: Abdomen is flat. Bowel sounds are normal.     Palpations: Abdomen is soft.  Genitourinary:    Rectum: Guaiac result positive.  Musculoskeletal:        General: Normal range of motion.     Cervical back: Normal range of motion and neck supple.  Skin:    General: Skin is warm.     Capillary Refill: Capillary refill takes less than 2 seconds.  Neurological:     General: No focal deficit present.     Mental Status: He is alert and oriented to person, place, and time.  Psychiatric:        Mood and Affect: Mood normal.        Behavior: Behavior normal.    ED Results / Procedures / Treatments   Labs (all labs ordered are listed, but only abnormal results are displayed) Labs Reviewed  COMPREHENSIVE METABOLIC PANEL - Abnormal; Notable for the following components:      Result Value   Glucose, Bld 118 (*)    Creatinine, Ser 1.26 (*)  Total Protein 6.3 (*)    Albumin 3.3 (*)    GFR, Estimated 57 (*)     All other components within normal limits  CBC WITH DIFFERENTIAL/PLATELET - Abnormal; Notable for the following components:   RBC 3.08 (*)    Hemoglobin 7.2 (*)    HCT 24.8 (*)    MCH 23.4 (*)    MCHC 29.0 (*)    RDW 16.0 (*)    All other components within normal limits  PROTIME-INR - Abnormal; Notable for the following components:   Prothrombin Time 15.4 (*)    All other components within normal limits  RESP PANEL BY RT-PCR (FLU A&B, COVID) ARPGX2  POC OCCULT BLOOD, ED  TYPE AND SCREEN  PREPARE RBC (CROSSMATCH)  TROPONIN I (HIGH SENSITIVITY)  TROPONIN I (HIGH SENSITIVITY)    EKG EKG Interpretation  Date/Time:  Thursday August 16 2021 09:05:17 EST Ventricular Rate:  74 PR Interval:  228 QRS Duration: 148 QT Interval:  442 QTC Calculation: 491 R Axis:   40 Text Interpretation: Sinus or ectopic atrial rhythm Atrial premature complex Prolonged PR interval Right bundle branch block No significant change since last tracing Confirmed by Isla Pence [phone]) on 08/16/2021 9:09:04 AM  Radiology DG Chest Port 1 View  Result Date: 08/16/2021 CLINICAL DATA:  Chest pain. EXAM: PORTABLE CHEST 1 VIEW COMPARISON:  March 20, 2017. FINDINGS: Stable cardiomediastinal silhouette. Large hiatal hernia is noted. Both lungs are clear. The visualized skeletal structures are unremarkable. IMPRESSION: Large hiatal hernia.  No acute abnormality seen. Electronically Signed   By: Marijo Conception M.D.   On: 08/16/2021 09:30    Procedures Procedures    Medications Ordered in ED Medications  0.9 %  sodium chloride infusion ( Intravenous New Bag/Given 08/16/21 0927)  0.9 %  sodium chloride infusion (has no administration in time range)  pantoprazole (PROTONIX) 80 mg /NS 100 mL IVPB (80 mg Intravenous New Bag/Given 08/16/21 1030)  pantoprozole (PROTONIX) 80 mg /NS 100 mL infusion (has no administration in time range)    ED Course/ Medical Decision Making/ A&P                           Medical  Decision Making  Pt is guaiac + and is dark.  I have ordered a protonix bolus and drip.  He sees Dr. Gala Romney for GI.  He last saw him in September for dysphagia.  Dr. Gala Romney consulted and he will see pt some time today.    Eliquis is held.  Repeat hgb show a level of 7.2.  2 units of blood have been ordered for transfusion.  He is ok with receiving blood.  Pt d/w Dr. Carles Collet ([facility]) who will admit.  CRITICAL CARE Performed by: Isla Pence   Total critical care time: 30 minutes  Critical care time was exclusive of separately billable procedures and treating other patients.  Critical care was necessary to treat or prevent imminent or life-threatening deterioration.  Critical care was time spent personally by me on the following activities: development of treatment plan with patient and/or surrogate as well as nursing, discussions with consultants, evaluation of patient's response to treatment, examination of patient, obtaining history from patient or surrogate, ordering and performing treatments and interventions, ordering and review of laboratory studies, ordering and review of radiographic studies, pulse oximetry and re-evaluation of patient's condition.          Final Clinical Impression(s) / ED Diagnoses Final diagnoses:  SOB (  shortness of breath)  Symptomatic anemia  Acute upper GI bleed  On apixaban therapy    Rx / DC Orders ED Discharge Orders     None         Isla Pence, MD 08/16/21 1049

## 2021-08-16 NOTE — H&P (Signed)
History and Physical  Brent Wilkins WUJ:[id number] DOB: 02-12-1941 DOA: 08/16/2021   PCP: Pcp, No   Patient coming from: Home  Chief Complaint: dizziness  HPI:  Brent Wilkins is a 81 y.o. 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  He states that the symptoms have been going on for a couple months, but has steadily worsened to the point where he is having difficulty standing without any dizziness.  He denies any fevers, chills, headache, chest pain, nausea, vomiting, diarrhea, abdominal pain.  He had an appointment at the electrophysiology office on 08/15/2021 where he voiced similar complaints.  Routine blood work was obtained and showed hemoglobin 7.6 which was a significant drop from 12.2 on 01/11/2021.  The patient is not aware of any medication, melena, hematemesis, hematuria.  He denies any NSAIDs.  He endorses compliance with all his medications.  During his cardiology appointment on 08/15/2021, he was noted to be orthostatic.  His amlodipine was discontinued and routine blood work was obtained as discussed above.  His last dose of apixaban was on the evening of 08/15/2021. ED In the ED, the patient was afebrile hemodynamically stable with oxygen saturation 100% room air.  BMP showed a sodium 141, potassium 4.3, bicarbonate 24, serum creatinine 1.26.  AST 17, ALT 7, alk phosphatase 60, total bilirubin 0.5.  WBC is 8.7, hemoglobin 7.2, platelets 400,000.  Chest x-ray showed a large hiatus hernia but no pulmonary edema or consolidation.  EKG showed atrial fibrillation with right bundle branch block.  GI was consulted assist with management. Rectal exam in ED showed melena, no hematochezia  Assessment/Plan: Symptomatic anemia/acute blood loss anemia/melena -GI consulted -Holding apixaban -Protonix  drip -Clear liquid diet for now -Check anemia panel -2 units PRBC ordered in the ED -09/10/2017 EGD--moderate Schatzki's ring which was dilated, mild erosive esophagitis; moderate hiatus hernia  CKD stage IIIa -Baseline creatinine 1.1-1.4  Persistent atrial fibrillation -Continue dofetilide -Optimize electrolytes -Status post ablation 10/06/2019 -Holding apixaban  Essential hypertension -Amlodipine was discontinued by cardiology 08/15/2021 secondary to orthostasis -Continue losartan  BPH -Continue tamsulosin and finasteride  Hypothyroidism -Continue Synthroid       Past Medical History:  Diagnosis Date   Acute blood loss anemia 08/16/2021   Allergy    hay fever;  seasonal   Arthritis    right ankle   Cataract    surgery 2016   Chronic kidney disease    stones many years ago   Constipation 12/09/2018   Dizziness 12/13/2015   Dysphagia    Family history of malignant neoplasm of prostate 12/12/2017   Frequent urination at night    GERD (gastroesophageal reflux disease)    History of arthroplasty of right ankle 02/04/2020   History of kidney stones    History of prostate cancer 06/16/2017   HTN (hypertension)    Hyperlipidemia LDL goal <100 10/26/2019   Hypertension    Phreesia 04/24/2020   Hypothyroidism    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 on Tikosyn and Eliquis b. s/p ablation in 10/2019 due to break-through episodes while on Tikosyn.    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 Rosana Hoes   Schatzki's ring 10/22/2011   Seasonal  allergies    Syncope  dx. 8 yrs ago   Syncope, cardiogenic 03/20/2017   Vaccine counseling 09/15/2019   Past Surgical History:  Procedure Laterality Date   APPENDECTOMY     ATRIAL FIBRILLATION ABLATION N/A 10/06/2019   Procedure: ATRIAL FIBRILLATION ABLATION;  Surgeon: Constance Haw, MD;   Location: [facility];  Service: Cardiovascular;  Laterality: N/A;   CATARACT EXTRACTION W/PHACO Left 03/27/2015   Procedure: CATARACT EXTRACTION PHACO AND INTRAOCULAR LENS PLACEMENT LEFT EYE CDE=11.73;  Surgeon: Tonny Branch, MD;  Location: [facility];  Service: Ophthalmology;  Laterality: Left;   CATARACT EXTRACTION W/PHACO Right 03/30/2015   Procedure: CATARACT EXTRACTION PHACO AND INTRAOCULAR LENS PLACEMENT RIGHT EYE CDE=7.35;  Surgeon: Tonny Branch, MD;  Location: [facility];  Service: Ophthalmology;  Laterality: Right;   CHOLECYSTECTOMY     COLONOSCOPY  07/23/10   Dr. Vivi Ferns rectum, scattered pancolonic diverticula   COLONOSCOPY  08/10/1999   internal hemorrhoids,inflammatory polyp   ESOPHAGOGASTRODUODENOSCOPY  04/12/08   Prominent Schatzki's ring, erosive reflux esophagitis, multiple antral erosions, small hiatal hernia, reactive gastropathy, status post dilation with 18F   ESOPHAGOGASTRODUODENOSCOPY  10/28/2011   Procedure: ESOPHAGOGASTRODUODENOSCOPY (EGD);  Surgeon: Daneil Dolin, MD;  Location: [facility];  Service: Endoscopy;  Laterality: N/A;  1:30   ESOPHAGOGASTRODUODENOSCOPY N/A 09/10/2017   Dr. Gala Romney: Schatkzi's ring s/p dilation at GE junction, mild esophageal reflux esophagitis, medium sized hiatal hernia, normal duodenum. 56 and 58 Fr dilation   EYE SURGERY     JOINT REPLACEMENT N/A    Phreesia 01/15/2020   MALONEY DILATION  10/28/2011   Procedure: Venia Minks DILATION;  Surgeon: Daneil Dolin, MD;  [facility];  Service: Endoscopy;  Laterality: N/A;   MALONEY DILATION N/A 09/10/2017   Procedure: Venia Minks DILATION;  Surgeon: Daneil Dolin, MD;  [facility];  Service: Endoscopy;  Laterality: N/A;   SAVORY DILATION  10/28/2011   Procedure: SAVORY DILATION;  Surgeon: Daneil Dolin, MD;  Location: [facility];  Service: Endoscopy;  Laterality: N/A;   TONSILLECTOMY     TOTAL ANKLE ARTHROPLASTY Left 11/25/2013   Procedure: TOTAL ANKLE ARTHOPLASTY;  Surgeon:  Wylene Simmer, MD;  Location: [place];  Service: Orthopedics;  Laterality: Left;   TOTAL ANKLE ARTHROPLASTY Right 07/14/2014   dr hewitt   TOTAL ANKLE ARTHROPLASTY Right 07/14/2014   Procedure: RIGHT TOTAL ANKLE ARTHOPLASTY;  Surgeon: Wylene Simmer, MD;  Location: [employer];  Service: Orthopedics;  Laterality: Right;   Social History:  reports that he has never smoked. He has never used smokeless tobacco. He reports current alcohol use of about 2.0 standard drinks per week. He reports that he does not use drugs.   Family History  Problem Relation Age of Onset   Prostate cancer Father    Cancer Father        prostate to bone   Hypertension Father    Prostate cancer Son 79   ALS Son    Prostate cancer Brother    Heart disease Mother        died at 36   Arthritis Mother    Colon cancer Neg Hx      Allergies  Allergen Reactions   Tetanus Toxoids Swelling    Face swelling   Statins Other (See Comments)    Hard to walk, severe leg cramps Other reaction(s): Other (See Comments) Muscle weakness "unable to walk"   Dicyclomine Other (See Comments)    constipation   Sulfamethoxazole  Other reaction(s): Other (See Comments) unknown   Doxycycline Rash    Rash only   Oxycodone Other (See Comments)    Severe constipation   Penicillins Swelling and Rash    Took as a child, developed rash over time Has patient had a PCN reaction causing immediate rash, facial/tongue/throat swelling, SOB or lightheadedness with hypotension: Yes Has patient had a PCN reaction causing severe rash involving mucus membranes or skin necrosis: Unknown Has patient had a PCN reaction that required hospitalization: No Has patient had a PCN reaction occurring within the last 10 years: No If all of the above answers are "NO", then may proceed with Cephalosporin use.  Other reaction(s): Other (See Comments) unknown   Sulfa Antibiotics Swelling and Rash   Xarelto [Rivaroxaban] Rash    rash     Prior to Admission  medications   Medication Sig Start Date End Date Taking? Authorizing Provider  apixaban (ELIQUIS) 5 MG TABS tablet TAKE 1 TABLET BY MOUTH  TWICE DAILY 04/03/21  Yes Fay Records, MD  diphenhydramine-acetaminophen (TYLENOL PM) 25-500 MG TABS tablet Take 1 tablet by mouth at bedtime as needed (sleep and mild discomfort).    Yes [provider]  dofetilide (TIKOSYN) 500 MCG capsule Take 1 capsule (500 mcg total) by mouth 2 (two) times daily. 06/05/21  Yes Camnitz, Will Hassell Done, MD  finasteride (PROSCAR) 5 MG tablet Take 1 tablet by mouth daily. 05/25/20  Yes [provider]  fluticasone (FLONASE) 50 MCG/ACT nasal spray USE 1 SPRAY IN BOTH  NOSTRILS DAILY AS NEEDED  FOR ALLERGIES 10/30/20  Yes Noreene Larsson, NP  gabapentin (NEURONTIN) 300 MG capsule Take 300 mg by mouth 2 (two) times daily.  04/19/15  Yes [provider]  ibuprofen (ADVIL,MOTRIN) 200 MG tablet Take 400 mg by mouth as needed for headache (headache/pain.).    Yes [provider]  levothyroxine (SYNTHROID) 75 MCG tablet TAKE 1 TABLET BY MOUTH  DAILY 08/22/20  Yes [name], NP  loratadine (CLARITIN) 10 MG tablet Take 10 mg by mouth daily as needed for allergies.   Yes [provider]  losartan (COZAAR) 50 MG tablet Take 1 tablet (50 mg total) by mouth daily. 06/05/21 09/03/21 Yes Camnitz, Will Hassell Done, MD  montelukast (SINGULAIR) 10 MG tablet Take 1 tablet (10 mg total) by mouth at bedtime. 08/03/20  Yes Noreene Larsson, NP  Omega-3 Fatty Acids (RA FISH OIL) 1400 MG CPDR Take 1,400 mg by mouth 2 (two) times daily.   Yes [provider]  pantoprazole (PROTONIX) 40 MG tablet TAKE 1 TABLET BY MOUTH  DAILY BEFORE BREAKFAST 08/09/20  Yes Jodi Mourning, Kristen S, PA-C  Polyethyl Glycol-Propyl Glycol 0.4-0.3 % SOLN Place 1 drop into both eyes 2 (two) times daily as needed (dry eyes).    Yes [provider]  Tamsulosin HCl (FLOMAX) 0.4 MG CAPS Take 0.4 mg by mouth daily after breakfast.    Yes  [provider]    Review of Systems:  Constitutional:  No weight loss, night sweats, fatigue.  Head&Eyes: No headache.  No vision loss.  No eye pain or scotoma ENT:  No Difficulty swallowing,Tooth/dental problems,Sore throat,  No ear ache, post nasal drip,  Cardio-vascular:  No chest pain, Orthopnea, PND, swelling in lower extremities,  dizziness, palpitations  GI:  No  abdominal pain, nausea, vomiting, diarrhea, loss of appetite, hematochezia, melena, heartburn, indigestion, Resp:   No cough. No coughing up of blood .No wheezing.No chest wall deformity  Skin:  no  rash or lesions.  GU:  no dysuria, change in color of urine, no urgency or frequency. No flank pain.  Musculoskeletal:  No joint pain or swelling. No decreased range of motion. No back pain.  Psych:  No change in mood or affect. No depression or anxiety. Neurologic: No headache, no dysesthesia, no focal weakness, no vision loss. No syncope  Physical Exam: Vitals:   08/16/21 0839 08/16/21 0842 08/16/21 0900 08/16/21 1000  BP: (!) 146/72  137/69 139/79  Pulse: 75  70 60  Resp: $Remo'17  13 14  'PaGNB$ Temp: 98.3 F (36.8 C)     TempSrc: Oral     SpO2: 100%  100% 97%  Weight:  87.8 kg    Height:  $Remove'5\' 10"'hgmwVEa$  (1.778 m)     General:  A&O x 3, NAD, nontoxic, pleasant/cooperative Head/Eye: No conjunctival hemorrhage, no icterus, Se[place]/AT, No nystagmus ENT:  No icterus,  No thrush, good dentition, no pharyngeal exudate Neck:  No masses, no lymphadenpathy, no bruits CV:  RRR, no rub, no gallop, no S3 Lung:  CTAB, good air movement, no wheeze, no rhonchi Abdomen: soft/NT, +BS, nondistended, no peritoneal signs Ext: No cyanosis, No rashes, No petechiae, No lymphangitis, No edema Neuro: CNII-XII intact, strength 4/5 in bilateral upper and lower extremities, no dysmetria  Labs on Admission:  Basic Metabolic Panel: Recent Labs  Lab 08/15/21 0850 08/16/21 0935  NA 139 141  K 4.5 4.3  CL 106 110  CO2 21 24  GLUCOSE 108*  118*  BUN 19 20  CREATININE 1.27 1.26*  CALCIUM 9.2 9.0  MG 2.2  --    Liver Function Tests: Recent Labs  Lab 08/16/21 0935  AST 17  ALT 7  ALKPHOS 60  BILITOT 0.5  PROT 6.3*  ALBUMIN 3.3*   No results for input(s): LIPASE, AMYLASE in the last 168 hours. No results for input(s): AMMONIA in the last 168 hours. CBC: Recent Labs  Lab 08/15/21 0850 08/16/21 0935  WBC 9.6 8.7  NEUTROABS  --  6.3  HGB 7.6* 7.2*  HCT 26.1* 24.8*  MCV 77* 80.5  PLT 466* 400   Coagulation Profile: Recent Labs  Lab 08/16/21 0935  INR 1.2   Cardiac Enzymes: No results for input(s): CKTOTAL, CKMB, CKMBINDEX, TROPONINI in the last 168 hours. BNP: Invalid input(s): POCBNP CBG: No results for input(s): GLUCAP in the last 168 hours. Urine analysis:    Component Value Date/Time   COLORURINE YELLOW 03/20/2017 1845   APPEARANCEUR CLEAR 03/20/2017 1845   LABSPEC 1.024 03/20/2017 1845   PHURINE 5.0 03/20/2017 1845   GLUCOSEU NEGATIVE 03/20/2017 1845   HGBUR NEGATIVE 03/20/2017 1845   BILIRUBINUR NEGATIVE 03/20/2017 1845   KETONESUR NEGATIVE 03/20/2017 1845   PROTEINUR NEGATIVE 03/20/2017 1845   NITRITE NEGATIVE 03/20/2017 1845   LEUKOCYTESUR NEGATIVE 03/20/2017 1845   Sepsis Labs: $RemoveBefo'@[email or web address]'[id number]$ (procalcitonin:4,lacticidven:4) )No results found for this or any previous visit (from the past 240 hour(s)).   Radiological Exams on Admission: DG Chest Port 1 View  Result Date: 08/16/2021 CLINICAL DATA:  Chest pain. EXAM: PORTABLE CHEST 1 VIEW COMPARISON:  March 20, 2017. FINDINGS: Stable cardiomediastinal silhouette. Large hiatal hernia is noted. Both lungs are clear. The visualized skeletal structures are unremarkable. IMPRESSION: Large hiatal hernia.  No acute abnormality seen. Electronically Signed   By: Marijo Conception M.D.   On: 08/16/2021 09:30    EKG: Independently reviewed. Afib, RBBB    Time spent:60 minutes Code Status:   FULL Family Communication:  No Family at  bedside Disposition Plan: expect 1-2 day hospitalization Consults called: GI DVT Prophylaxis:   SCDs--apixaban on hold  Orson Eva, DO  [facility] Pager [phone]  If 7PM-7AM, please contact night-coverage [email or web address] Password [facility] 08/16/2021, 10:53 AM

## 2021-08-16 NOTE — Plan of Care (Signed)

## 2021-08-16 NOTE — Consult Note (Signed)
Referring Provider: Orson Eva, MD Primary Care Physician:  Pcp, No Primary Gastroenterologist:  Garfield Cornea, MD   Reason for Consultation:  anemia, heme + stool  HPI: Brent Wilkins is a 81 y.o. male with hypertension, A. fib on Eliquis, RBBB, chronic kidney disease, hypothyroidism, GERD, hospitalized for syncope/orthostatic hypotension in 2018 thought to be cardiogenic in nature, presenting for further evaluation of low Hgb.   Seen by cardiology yesterday with complaints of worsening dizziness, lightheadedness over a few weeks.  Positive orthostatics. Labs yesterday showed hemoglobin of 7.6 which was down from 12.2 six months prior.  MCV 77, platelets 166,000, BUN 19, creatinine 1.27. He was contacted this morning and advised to come to the ED.   In the ED his BP 146/72, pulse 75. BUN is 20, creatinine 1.26, hemoglobin 7.2, platelets 400,000, INR 1.2, troponin 6. Stool was "dark" and heme positive. Plans for 2 units of packed red blood cells. CXR showed large hiatal hernia.   He states he has had worsening fatigue and dizziness over the past few months. He has history of orthostatic hypotension and had been doing ok up until few months ago.  He has had some of his medications adjusted as initially was felt to be related to increased dose of losartan.  He thought his symptoms were the same as when he was hospitalized with syncope in 2018.  He denies any melena or rectal bleeding.  BMs are regular.  His reflux has been well controlled.  No dysphagia.  Appetite good.  States he is lost about 4 pounds and he contributes that to being less active in the setting of dizziness.  Denies abdominal pain.  No chest pain or shortness of breath.  Last dose of Eliquis January 11 around 6 PM.  Denies aspirin use.  Rare ibuprofen use.  Colonoscopy December 2011 with scattered pancolonic diverticula.  EGD in February 2019 with Schatzki ring status post dilation of the GE junction, mild esophageal reflux  esophagitis, medium sized hiatal hernia, normal duodenum.   Prior to Admission medications   Medication Sig Start Date End Date Taking? Authorizing Provider  apixaban (ELIQUIS) 5 MG TABS tablet TAKE 1 TABLET BY MOUTH  TWICE DAILY 04/03/21  Yes Fay Records, MD  diphenhydramine-acetaminophen (TYLENOL PM) 25-500 MG TABS tablet Take 1 tablet by mouth at bedtime as needed (sleep and mild discomfort).    Yes [provider]  dofetilide (TIKOSYN) 500 MCG capsule Take 1 capsule (500 mcg total) by mouth 2 (two) times daily. 06/05/21  Yes Camnitz, Will Hassell Done, MD  finasteride (PROSCAR) 5 MG tablet Take 1 tablet by mouth daily. 05/25/20  Yes [provider]  fluticasone (FLONASE) 50 MCG/ACT nasal spray USE 1 SPRAY IN BOTH  NOSTRILS DAILY AS NEEDED  FOR ALLERGIES 10/30/20  Yes Noreene Larsson, NP  gabapentin (NEURONTIN) 300 MG capsule Take 300 mg by mouth 2 (two) times daily.  04/19/15  Yes [provider]  ibuprofen (ADVIL,MOTRIN) 200 MG tablet Take 400 mg by mouth as needed for headache (headache/pain.).    Yes [provider]  levothyroxine (SYNTHROID) 75 MCG tablet TAKE 1 TABLET BY MOUTH  DAILY 08/22/20  Yes [name], NP  loratadine (CLARITIN) 10 MG tablet Take 10 mg by mouth daily as needed for allergies.   Yes [provider]  losartan (COZAAR) 50 MG tablet Take 1 tablet (50 mg total) by mouth daily. 06/05/21 09/03/21 Yes Camnitz, Will Hassell Done, MD  montelukast (SINGULAIR) 10 MG tablet Take 1 tablet (  10 mg total) by mouth at bedtime. 08/03/20  Yes Noreene Larsson, NP  Omega-3 Fatty Acids (RA FISH OIL) 1400 MG CPDR Take 1,400 mg by mouth 2 (two) times daily.   Yes [provider]  pantoprazole (PROTONIX) 40 MG tablet TAKE 1 TABLET BY MOUTH  DAILY BEFORE BREAKFAST 08/09/20  Yes Jodi Mourning, Kristen S, PA-C  Polyethyl Glycol-Propyl Glycol 0.4-0.3 % SOLN Place 1 drop into both eyes 2 (two) times daily as needed (dry eyes).    Yes [provider]   Tamsulosin HCl (FLOMAX) 0.4 MG CAPS Take 0.4 mg by mouth daily after breakfast.    Yes [provider]    Current Facility-Administered Medications  Medication Dose Route Frequency Provider Last Rate Last Admin   0.9 %  sodium chloride infusion   Intravenous Continuous Isla Pence, MD 125 mL/hr at 08/16/21 0927 New Bag at 08/16/21 0927   0.9 %  sodium chloride infusion  10 mL/hr Intravenous Once Isla Pence, MD       feeding supplement (BOOST / RESOURCE BREEZE) liquid 1 Container  1 Container Oral TID BM Tat, Shanon Brow, MD       pantoprozole (PROTONIX) 80 mg /NS 100 mL infusion  8 mg/hr Intravenous Continuous Isla Pence, MD 10 mL/hr at 08/16/21 1110 8 mg/hr at 08/16/21 1110    Allergies as of 08/16/2021 - Review Complete 08/16/2021  Allergen Reaction Noted   Tetanus toxoids Swelling 05/10/2013   Statins Other (See Comments) 10/12/2012   Dicyclomine Other (See Comments) 12/21/2018   Sulfamethoxazole  09/26/2011   Doxycycline Rash 09/30/2011   Oxycodone Other (See Comments) 07/06/2014   Penicillins Swelling and Rash 09/26/2011   Sulfa antibiotics Swelling and Rash 10/22/2011   Xarelto [rivaroxaban] Rash 06/16/2017    Past Medical History:  Diagnosis Date   Acute blood loss anemia 08/16/2021   Allergy    hay fever;  seasonal   Arthritis    right ankle   Cataract    surgery 2016   Chronic kidney disease    stones many years ago   Constipation 12/09/2018   Dizziness 12/13/2015   Dysphagia    Family history of malignant neoplasm of prostate 12/12/2017   Frequent urination at night    GERD (gastroesophageal reflux disease)    History of arthroplasty of right ankle 02/04/2020   History of kidney stones    History of prostate cancer 06/16/2017   HTN (hypertension)    Hyperlipidemia LDL goal <100 10/26/2019   Hypertension    Phreesia 04/24/2020   Hypothyroidism    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 on Tikosyn and Eliquis b. s/p ablation in 10/2019 due to break-through episodes while on Tikosyn.    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 Rosana Hoes   Schatzki's ring 10/22/2011   Seasonal allergies    Syncope  dx. 8 yrs ago   Syncope, cardiogenic 03/20/2017   Vaccine counseling 09/15/2019    Past Surgical History:  Procedure Laterality Date   APPENDECTOMY     ATRIAL FIBRILLATION ABLATION N/A 10/06/2019   Procedure: ATRIAL FIBRILLATION ABLATION;  Surgeon: Constance Haw, MD;  Location: [place] [facility];  Service: Cardiovascular;  Laterality: N/A;   CATARACT EXTRACTION W/PHACO Left 03/27/2015   Procedure: CATARACT EXTRACTION PHACO AND INTRAOCULAR LENS PLACEMENT LEFT EYE CDE=11.73;  Surgeon: Tonny Branch,  MD;  Location: [facility];  Service: Ophthalmology;  Laterality: Left;   CATARACT EXTRACTION W/PHACO Right 03/30/2015   Procedure: CATARACT EXTRACTION PHACO AND INTRAOCULAR LENS PLACEMENT RIGHT EYE CDE=7.35;  Surgeon: Tonny Branch, MD;  Location: [facility];  Service: Ophthalmology;  Laterality: Right;   CHOLECYSTECTOMY     COLONOSCOPY  07/23/10   Dr. Vivi Ferns rectum, scattered pancolonic diverticula   COLONOSCOPY  08/10/1999   internal hemorrhoids,inflammatory polyp   ESOPHAGOGASTRODUODENOSCOPY  04/12/08   Prominent Schatzki's ring, erosive reflux esophagitis, multiple antral erosions, small hiatal hernia, reactive gastropathy, status post dilation with 33F   ESOPHAGOGASTRODUODENOSCOPY  10/28/2011   Procedure: ESOPHAGOGASTRODUODENOSCOPY (EGD);  Surgeon: Daneil Dolin, MD;  Location: [facility];  Service: Endoscopy;  Laterality: N/A;  1:30   ESOPHAGOGASTRODUODENOSCOPY N/A 09/10/2017   Dr. Gala Romney: Schatkzi's ring s/p dilation at GE junction, mild esophageal reflux esophagitis, medium sized hiatal hernia, normal duodenum. 56 and 58 Fr dilation   EYE SURGERY     JOINT REPLACEMENT N/A    Phreesia 01/15/2020   MALONEY  DILATION  10/28/2011   Procedure: Venia Minks DILATION;  Surgeon: Daneil Dolin, MD;  [facility];  Service: Endoscopy;  Laterality: N/A;   MALONEY DILATION N/A 09/10/2017   Procedure: Venia Minks DILATION;  Surgeon: Daneil Dolin, MD;  [facility];  Service: Endoscopy;  Laterality: N/A;   SAVORY DILATION  10/28/2011   Procedure: SAVORY DILATION;  Surgeon: Daneil Dolin, MD;  [facility];  Service: Endoscopy;  Laterality: N/A;   TONSILLECTOMY     TOTAL ANKLE ARTHROPLASTY Left 11/25/2013   Procedure: TOTAL ANKLE ARTHOPLASTY;  Surgeon: Wylene Simmer, MD;  Location: [facility];  Service: Orthopedics;  Laterality: Left;   TOTAL ANKLE ARTHROPLASTY Right 07/14/2014   dr hewitt   TOTAL ANKLE ARTHROPLASTY Right 07/14/2014   Procedure: RIGHT TOTAL ANKLE ARTHOPLASTY;  Surgeon: Wylene Simmer, MD;  Location: [place];  Service: Orthopedics;  Laterality: Right;    Family History  Problem Relation Age of Onset   Prostate cancer Father    Cancer Father        prostate to bone   Hypertension Father    Prostate cancer Son 18   ALS Son    Prostate cancer Brother    Heart disease Mother        died at 54   Arthritis Mother    Colon cancer Neg Hx     Social History   Socioeconomic History   Marital status: Married    Spouse name: Candace   Number of children: 2   Years of education: Not on file   Highest education level: Bachelor's degree (e.g., BA, AB, BS)  Occupational History   Occupation: retired; Ambulance person: RETIRED  Tobacco Use   Smoking status: Never   Smokeless tobacco: Never  Vaping Use   Vaping Use: Never used  Substance and Sexual Activity   Alcohol use: Yes    Alcohol/week: 2.0 standard drinks    Types: 1 Glasses of wine, 1 Cans of beer per week    Comment: glass of wine or "couple of beers" occasionally    Drug use: No   Sexual activity: Yes    Partners: Female  Other Topics Concern   Not on file  Social History Narrative   Lives  with wife Candace.       Moved from [place], [place]   Retired from [employer].   Plays golf.   Works with [employer]  club and [employer].   Wears seatbelt.   Drives.   Eats all food groups.   Never smoked.   Married for over 64 years June  (2020)   Has two grown children, live in [place] and [place].   Social Determinants of Health   Financial Resource Strain: Low Risk    Difficulty of Paying Living Expenses: Not hard at all  Food Insecurity: No Food Insecurity   Worried About Charity fundraiser in the Last Year: Never true   [place] in the Last Year: Never true  Transportation Needs: No Transportation Needs   Lack of Transportation (Medical): No   Lack of Transportation (Non-Medical): No  Physical Activity: Not on file  Stress: Not on file  Social Connections: Socially Integrated   Frequency of Communication with Friends and Family: More than three times a week   Frequency of Social Gatherings with Friends and Family: More than three times a week   Attends Religious Services: More than 4 times per year   Active Member of [employer] or Organizations: Yes   Attends Music therapist: More than 4 times per year   Marital Status: Married  Human resources officer Violence: Not on file     ROS:  General: Negative for anorexia,  fever, chills. See hpi. Eyes: Negative for vision changes.  ENT: Negative for hoarseness, difficulty swallowing , nasal congestion. CV: Negative for chest pain, angina, palpitations, dyspnea on exertion, peripheral edema.  Respiratory: Negative for dyspnea at rest, dyspnea on exertion, cough, sputum, wheezing.  GI: See history of present illness. GU:  Negative for dysuria, hematuria, urinary incontinence, urinary frequency, nocturnal urination.  MS: Negative for joint pain, low back pain.  Derm: Negative for rash or itching.  Neuro: Negative for  abnormal sensation, seizure, frequent headaches, memory loss, confusion.  Psych: Negative for  anxiety, depression, suicidal ideation, hallucinations.  Endo: Negative for unusual weight change.  Heme: Negative for bruising or bleeding. Allergy: Negative for rash or hives.       Physical Examination: Vital signs in last 24 hours: Temp:  [97.7 F (36.5 C)-98.3 F (36.8 C)] 97.8 F (36.6 C) (01/12 1321) Pulse Rate:  [60-81] 66 (01/12 1321) Resp:  [13-17] 16 (01/12 1321) BP: (137-154)/(62-79) 154/74 (01/12 1321) SpO2:  [97 %-100 %] 100 % (01/12 1321) Weight:  [87.8 kg] 87.8 kg (01/12 0842) Last BM Date: 08/15/21  General: Well-nourished, well-developed in no acute distress.  Head: Normocephalic, atraumatic.   Eyes: Conjunctiva pale, no icterus. Mouth: masked. Neck: Supple without thyromegaly, masses, or lymphadenopathy.  Lungs: Clear to auscultation bilaterally.  Heart: Regular rate and rhythm, no murmurs rubs or gallops.  Abdomen: Bowel sounds are normal, nontender, nondistended, no hepatosplenomegaly or masses, no abdominal bruits or    hernia , no rebound or guarding.   Rectal: not performed Extremities: No lower extremity edema, clubbing, deformity.  Neuro: Alert and oriented x 4 , grossly normal neurologically.  Skin: Warm and dry, no rash or jaundice.   Psych: Alert and cooperative, normal mood and affect.        Intake/Output from previous day: No intake/output data recorded. Intake/Output this shift: No intake/output data recorded.  Lab Results: CBC Recent Labs    08/15/21 0850 08/16/21 0935  WBC 9.6 8.7  HGB 7.6* 7.2*  HCT 26.1* 24.8*  MCV 77* 80.5  PLT 466* 400   BMET Recent Labs    08/15/21 0850 08/16/21 0935  NA 139 141  K 4.5 4.3  CL 106 110  CO2 21 24  GLUCOSE 108* 118*  BUN 19 20  CREATININE 1.27 1.26*  CALCIUM 9.2 9.0   LFT Recent Labs    08/16/21 0935  BILITOT 0.5  ALKPHOS 60  AST 17  ALT 7  PROT 6.3*  ALBUMIN 3.3*    Lipase No results for input(s): LIPASE in the last 72 hours.  PT/INR Recent Labs    08/16/21 0935   LABPROT 15.4*  INR 1.2      Imaging Studies: DG Chest Port 1 View  Result Date: 08/16/2021 CLINICAL DATA:  Chest pain. EXAM: PORTABLE CHEST 1 VIEW COMPARISON:  March 20, 2017. FINDINGS: Stable cardiomediastinal silhouette. Large hiatal hernia is noted. Both lungs are clear. The visualized skeletal structures are unremarkable. IMPRESSION: Large hiatal hernia.  No acute abnormality seen. Electronically Signed   By: Marijo Conception M.D.   On: 08/16/2021 09:30  [4 week]   Impression: Pleasant 81 year old male with history of hypertension, A. fib on Eliquis, RBBB, chronic kidney disease, hypothyroidism, history of orthostatic hypotension/syncope presenting for further evaluation of symptomatic microcytic anemia.  Microcytic anemia: Hemoglobin 7.6 yesterday, 7.2 today.  Hemoglobin was 12.2 in June 2022.  Chronically on Eliquis.  Denies any melena or rectal bleeding.  Stool dark and heme positive on exam today.  Suspect chronic occult GI bleeding, possibly upper GI source.  Last EGD in 2019 he had medium sized hiatal hernia, large hiatal hernia noted on chest x-ray today.  Possibly bleeding from [place] lesions in the setting of Eliquis.  Remote colonoscopy in 2011, based on EGD findings he may need further work-up.  Plan: Hold Eliquis. Pantoprazole infusions. Transfuse as needed.  EGD once Eliquis has washed out.   We would like to thank you for the opportunity to participate in the care of Brent Wilkins.  Laureen Ochs. Bernarda Caffey [facility] [phone] 1/12/20231:45 PM    LOS: 0 days

## 2021-08-16 NOTE — Telephone Encounter (Signed)
Called patient to follow up on lab, initially on recheck on his result this AM Epic pulled up an old result and I thought I had made an error in reading his lab result and called the patient to discuss, though with refresh on lab yesterday's result was visible again and confirmed with the patient that yesterday's CBC noted marked reduction in his H/H. Apologized for the initial confusion, but they confirm clarity on the correct result and anemic status, he is surprised though does not note any change in stool, or noted any obvious bleeding, but is feeling poorly Pt wife reports that he remains dizzy with standing and feeling weak, they are agreeable to go to the ER, will be going to [facility]. He did not take his Eliquis this AM  Tommye Standard, PA-C

## 2021-08-16 NOTE — Telephone Encounter (Signed)
Spoke with patient and made aware of lab results low HGb ( very anemic) along with holding Eliquis. Per Brent Wilkins he is recommended to go to ER for further evaluation especially with his low blood pressures. Patient was worried of wordage to explain his reason of coming into ER. Patient was explained that [facility] hospital is under the same medical software system and they can look up lab results to see the issue at hand. Patient understood and stated is getting hisself up together and going to [facility]. Patient was so appreciative for call.

## 2021-08-16 NOTE — ED Triage Notes (Signed)
Pt was advised to come here by his cardiac PA due to his hemoglobin being low. Pt c/o lightheadedness and weakness

## 2021-08-16 NOTE — H&P (View-Only) (Signed)
Referring Provider: Orson Eva, MD Primary Care Physician:  Pcp, No Primary Gastroenterologist:  Garfield Cornea, MD   Reason for Consultation:  anemia, heme + stool  HPI: Brent Wilkins is a 81 y.o. male with hypertension, A. fib on Eliquis, RBBB, chronic kidney disease, hypothyroidism, GERD, hospitalized for syncope/orthostatic hypotension in 2018 thought to be cardiogenic in nature, presenting for further evaluation of low Hgb.   Seen by cardiology yesterday with complaints of worsening dizziness, lightheadedness over a few weeks.  Positive orthostatics. Labs yesterday showed hemoglobin of 7.6 which was down from 12.2 six months prior.  MCV 77, platelets 166,000, BUN 19, creatinine 1.27. He was contacted this morning and advised to come to the ED.   In the ED his BP 146/72, pulse 75. BUN is 20, creatinine 1.26, hemoglobin 7.2, platelets 400,000, INR 1.2, troponin 6. Stool was "dark" and heme positive. Plans for 2 units of packed red blood cells. CXR showed large hiatal hernia.   He states he has had worsening fatigue and dizziness over the past few months. He has history of orthostatic hypotension and had been doing ok up until few months ago.  He has had some of his medications adjusted as initially was felt to be related to increased dose of losartan.  He thought his symptoms were the same as when he was hospitalized with syncope in 2018.  He denies any melena or rectal bleeding.  BMs are regular.  His reflux has been well controlled.  No dysphagia.  Appetite good.  States he is lost about 4 pounds and he contributes that to being less active in the setting of dizziness.  Denies abdominal pain.  No chest pain or shortness of breath.  Last dose of Eliquis January 11 around 6 PM.  Denies aspirin use.  Rare ibuprofen use.  Colonoscopy December 2011 with scattered pancolonic diverticula.  EGD in February 2019 with Schatzki ring status post dilation of the GE junction, mild esophageal reflux  esophagitis, medium sized hiatal hernia, normal duodenum.   Prior to Admission medications   Medication Sig Start Date End Date Taking? Authorizing Provider  apixaban (ELIQUIS) 5 MG TABS tablet TAKE 1 TABLET BY MOUTH  TWICE DAILY 04/03/21  Yes Fay Records, MD  diphenhydramine-acetaminophen (TYLENOL PM) 25-500 MG TABS tablet Take 1 tablet by mouth at bedtime as needed (sleep and mild discomfort).    Yes [provider]  dofetilide (TIKOSYN) 500 MCG capsule Take 1 capsule (500 mcg total) by mouth 2 (two) times daily. 06/05/21  Yes Camnitz, Will Hassell Done, MD  finasteride (PROSCAR) 5 MG tablet Take 1 tablet by mouth daily. 05/25/20  Yes [provider]  fluticasone (FLONASE) 50 MCG/ACT nasal spray USE 1 SPRAY IN BOTH  NOSTRILS DAILY AS NEEDED  FOR ALLERGIES 10/30/20  Yes Noreene Larsson, NP  gabapentin (NEURONTIN) 300 MG capsule Take 300 mg by mouth 2 (two) times daily.  04/19/15  Yes [provider]  ibuprofen (ADVIL,MOTRIN) 200 MG tablet Take 400 mg by mouth as needed for headache (headache/pain.).    Yes [provider]  levothyroxine (SYNTHROID) 75 MCG tablet TAKE 1 TABLET BY MOUTH  DAILY 08/22/20  Yes [name], NP  loratadine (CLARITIN) 10 MG tablet Take 10 mg by mouth daily as needed for allergies.   Yes [provider]  losartan (COZAAR) 50 MG tablet Take 1 tablet (50 mg total) by mouth daily. 06/05/21 09/03/21 Yes Camnitz, Will Hassell Done, MD  montelukast (SINGULAIR) 10 MG tablet Take 1 tablet (  10 mg total) by mouth at bedtime. 08/03/20  Yes Noreene Larsson, NP  Omega-3 Fatty Acids (RA FISH OIL) 1400 MG CPDR Take 1,400 mg by mouth 2 (two) times daily.   Yes [provider]  pantoprazole (PROTONIX) 40 MG tablet TAKE 1 TABLET BY MOUTH  DAILY BEFORE BREAKFAST 08/09/20  Yes Jodi Mourning, Kristen S, PA-C  Polyethyl Glycol-Propyl Glycol 0.4-0.3 % SOLN Place 1 drop into both eyes 2 (two) times daily as needed (dry eyes).    Yes [provider]   Tamsulosin HCl (FLOMAX) 0.4 MG CAPS Take 0.4 mg by mouth daily after breakfast.    Yes [provider]    Current Facility-Administered Medications  Medication Dose Route Frequency Provider Last Rate Last Admin   0.9 %  sodium chloride infusion   Intravenous Continuous Isla Pence, MD 125 mL/hr at 08/16/21 0927 New Bag at 08/16/21 0927   0.9 %  sodium chloride infusion  10 mL/hr Intravenous Once Isla Pence, MD       feeding supplement (BOOST / RESOURCE BREEZE) liquid 1 Container  1 Container Oral TID BM Tat, Shanon Brow, MD       pantoprozole (PROTONIX) 80 mg /NS 100 mL infusion  8 mg/hr Intravenous Continuous Isla Pence, MD 10 mL/hr at 08/16/21 1110 8 mg/hr at 08/16/21 1110    Allergies as of 08/16/2021 - Review Complete 08/16/2021  Allergen Reaction Noted   Tetanus toxoids Swelling 05/10/2013   Statins Other (See Comments) 10/12/2012   Dicyclomine Other (See Comments) 12/21/2018   Sulfamethoxazole  09/26/2011   Doxycycline Rash 09/30/2011   Oxycodone Other (See Comments) 07/06/2014   Penicillins Swelling and Rash 09/26/2011   Sulfa antibiotics Swelling and Rash 10/22/2011   Xarelto [rivaroxaban] Rash 06/16/2017    Past Medical History:  Diagnosis Date   Acute blood loss anemia 08/16/2021   Allergy    hay fever;  seasonal   Arthritis    right ankle   Cataract    surgery 2016   Chronic kidney disease    stones many years ago   Constipation 12/09/2018   Dizziness 12/13/2015   Dysphagia    Family history of malignant neoplasm of prostate 12/12/2017   Frequent urination at night    GERD (gastroesophageal reflux disease)    History of arthroplasty of right ankle 02/04/2020   History of kidney stones    History of prostate cancer 06/16/2017   HTN (hypertension)    Hyperlipidemia LDL goal <100 10/26/2019   Hypertension    Phreesia 04/24/2020   Hypothyroidism    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 on Tikosyn and Eliquis b. s/p ablation in 10/2019 due to break-through episodes while on Tikosyn.    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 Rosana Hoes   Schatzki's ring 10/22/2011   Seasonal allergies    Syncope  dx. 8 yrs ago   Syncope, cardiogenic 03/20/2017   Vaccine counseling 09/15/2019    Past Surgical History:  Procedure Laterality Date   APPENDECTOMY     ATRIAL FIBRILLATION ABLATION N/A 10/06/2019   Procedure: ATRIAL FIBRILLATION ABLATION;  Surgeon: Constance Haw, MD;  Location: [facility];  Service: Cardiovascular;  Laterality: N/A;   CATARACT EXTRACTION W/PHACO Left 03/27/2015   Procedure: CATARACT EXTRACTION PHACO AND INTRAOCULAR LENS PLACEMENT LEFT EYE CDE=11.73;  Surgeon: Tonny Branch,  MD;  Location: [facility];  Service: Ophthalmology;  Laterality: Left;   CATARACT EXTRACTION W/PHACO Right 03/30/2015   Procedure: CATARACT EXTRACTION PHACO AND INTRAOCULAR LENS PLACEMENT RIGHT EYE CDE=7.35;  Surgeon: Tonny Branch, MD;  Location: [facility];  Service: Ophthalmology;  Laterality: Right;   CHOLECYSTECTOMY     COLONOSCOPY  07/23/10   Dr. Vivi Ferns rectum, scattered pancolonic diverticula   COLONOSCOPY  08/10/1999   internal hemorrhoids,inflammatory polyp   ESOPHAGOGASTRODUODENOSCOPY  04/12/08   Prominent Schatzki's ring, erosive reflux esophagitis, multiple antral erosions, small hiatal hernia, reactive gastropathy, status post dilation with 23F   ESOPHAGOGASTRODUODENOSCOPY  10/28/2011   Procedure: ESOPHAGOGASTRODUODENOSCOPY (EGD);  Surgeon: Daneil Dolin, MD;  Location: [facility];  Service: Endoscopy;  Laterality: N/A;  1:30   ESOPHAGOGASTRODUODENOSCOPY N/A 09/10/2017   Dr. Gala Romney: Schatkzi's ring s/p dilation at GE junction, mild esophageal reflux esophagitis, medium sized hiatal hernia, normal duodenum. 56 and 58 Fr dilation   EYE SURGERY     JOINT REPLACEMENT N/A    Phreesia 01/15/2020   MALONEY  DILATION  10/28/2011   Procedure: Venia Minks DILATION;  Surgeon: Daneil Dolin, MD;  [facility]: [facility];  Service: Endoscopy;  Laterality: N/A;   MALONEY DILATION N/A 09/10/2017   Procedure: Venia Minks DILATION;  Surgeon: Daneil Dolin, MD;  [facility];  Service: Endoscopy;  Laterality: N/A;   SAVORY DILATION  10/28/2011   Procedure: SAVORY DILATION;  Surgeon: Daneil Dolin, MD;  [facility]: [facility];  Service: Endoscopy;  Laterality: N/A;   TONSILLECTOMY     TOTAL ANKLE ARTHROPLASTY Left 11/25/2013   Procedure: TOTAL ANKLE ARTHOPLASTY;  Surgeon: Wylene Simmer, MD;  Location: [facility];  Service: Orthopedics;  Laterality: Left;   TOTAL ANKLE ARTHROPLASTY Right 07/14/2014   dr hewitt   TOTAL ANKLE ARTHROPLASTY Right 07/14/2014   Procedure: RIGHT TOTAL ANKLE ARTHOPLASTY;  Surgeon: Wylene Simmer, MD;  Location: [facility];  Service: Orthopedics;  Laterality: Right;    Family History  Problem Relation Age of Onset   Prostate cancer Father    Cancer Father        prostate to bone   Hypertension Father    Prostate cancer Son 20   ALS Son    Prostate cancer Brother    Heart disease Mother        died at 43   Arthritis Mother    Colon cancer Neg Hx     Social History   Socioeconomic History   Marital status: Married    Spouse name: Candace   Number of children: 2   Years of education: Not on file   Highest education level: Bachelor's degree (e.g., BA, AB, BS)  Occupational History   Occupation: retired; Ambulance person: RETIRED  Tobacco Use   Smoking status: Never   Smokeless tobacco: Never  Vaping Use   Vaping Use: Never used  Substance and Sexual Activity   Alcohol use: Yes    Alcohol/week: 2.0 standard drinks    Types: 1 Glasses of wine, 1 Cans of beer per week    Comment: glass of wine or "couple of beers" occasionally    Drug use: No   Sexual activity: Yes    Partners: Female  Other Topics Concern   Not on file  Social History Narrative   Lives  with wife Candace.       Moved from [place], [place]   Retired from [employer].   Plays golf.   Works with [employer]  club and [employer].   Wears seatbelt.   Drives.   Eats all food groups.   Never smoked.   Married for over 30 years June  (2020)   Has two grown children, live in [place] and [place].   Social Determinants of Health   Financial Resource Strain: Low Risk    Difficulty of Paying Living Expenses: Not hard at all  Food Insecurity: No Food Insecurity   Worried About Charity fundraiser in the Last Year: Never true   [place] in the Last Year: Never true  Transportation Needs: No Transportation Needs   Lack of Transportation (Medical): No   Lack of Transportation (Non-Medical): No  Physical Activity: Not on file  Stress: Not on file  Social Connections: Socially Integrated   Frequency of Communication with Friends and Family: More than three times a week   Frequency of Social Gatherings with Friends and Family: More than three times a week   Attends Religious Services: More than 4 times per year   Active Member of [employer] or Organizations: Yes   Attends Music therapist: More than 4 times per year   Marital Status: Married  Human resources officer Violence: Not on file     ROS:  General: Negative for anorexia,  fever, chills. See hpi. Eyes: Negative for vision changes.  ENT: Negative for hoarseness, difficulty swallowing , nasal congestion. CV: Negative for chest pain, angina, palpitations, dyspnea on exertion, peripheral edema.  Respiratory: Negative for dyspnea at rest, dyspnea on exertion, cough, sputum, wheezing.  GI: See history of present illness. GU:  Negative for dysuria, hematuria, urinary incontinence, urinary frequency, nocturnal urination.  MS: Negative for joint pain, low back pain.  Derm: Negative for rash or itching.  Neuro: Negative for  abnormal sensation, seizure, frequent headaches, memory loss, confusion.  Psych: Negative for  anxiety, depression, suicidal ideation, hallucinations.  Endo: Negative for unusual weight change.  Heme: Negative for bruising or bleeding. Allergy: Negative for rash or hives.       Physical Examination: Vital signs in last 24 hours: Temp:  [97.7 F (36.5 C)-98.3 F (36.8 C)] 97.8 F (36.6 C) (01/12 1321) Pulse Rate:  [60-81] 66 (01/12 1321) Resp:  [13-17] 16 (01/12 1321) BP: (137-154)/(62-79) 154/74 (01/12 1321) SpO2:  [97 %-100 %] 100 % (01/12 1321) Weight:  [87.8 kg] 87.8 kg (01/12 0842) Last BM Date: 08/15/21  General: Well-nourished, well-developed in no acute distress.  Head: Normocephalic, atraumatic.   Eyes: Conjunctiva pale, no icterus. Mouth: masked. Neck: Supple without thyromegaly, masses, or lymphadenopathy.  Lungs: Clear to auscultation bilaterally.  Heart: Regular rate and rhythm, no murmurs rubs or gallops.  Abdomen: Bowel sounds are normal, nontender, nondistended, no hepatosplenomegaly or masses, no abdominal bruits or    hernia , no rebound or guarding.   Rectal: not performed Extremities: No lower extremity edema, clubbing, deformity.  Neuro: Alert and oriented x 4 , grossly normal neurologically.  Skin: Warm and dry, no rash or jaundice.   Psych: Alert and cooperative, normal mood and affect.        Intake/Output from previous day: No intake/output data recorded. Intake/Output this shift: No intake/output data recorded.  Lab Results: CBC Recent Labs    08/15/21 0850 08/16/21 0935  WBC 9.6 8.7  HGB 7.6* 7.2*  HCT 26.1* 24.8*  MCV 77* 80.5  PLT 466* 400   BMET Recent Labs    08/15/21 0850 08/16/21 0935  NA 139 141  K 4.5 4.3  CL 106 110  CO2 21 24  GLUCOSE 108* 118*  BUN 19 20  CREATININE 1.27 1.26*  CALCIUM 9.2 9.0   LFT Recent Labs    08/16/21 0935  BILITOT 0.5  ALKPHOS 60  AST 17  ALT 7  PROT 6.3*  ALBUMIN 3.3*    Lipase No results for input(s): LIPASE in the last 72 hours.  PT/INR Recent Labs    08/16/21 0935   LABPROT 15.4*  INR 1.2      Imaging Studies: DG Chest Port 1 View  Result Date: 08/16/2021 CLINICAL DATA:  Chest pain. EXAM: PORTABLE CHEST 1 VIEW COMPARISON:  March 20, 2017. FINDINGS: Stable cardiomediastinal silhouette. Large hiatal hernia is noted. Both lungs are clear. The visualized skeletal structures are unremarkable. IMPRESSION: Large hiatal hernia.  No acute abnormality seen. Electronically Signed   By: Marijo Conception M.D.   On: 08/16/2021 09:30  [4 week]   Impression: Pleasant 81 year old male with history of hypertension, A. fib on Eliquis, RBBB, chronic kidney disease, hypothyroidism, history of orthostatic hypotension/syncope presenting for further evaluation of symptomatic microcytic anemia.  Microcytic anemia: Hemoglobin 7.6 yesterday, 7.2 today.  Hemoglobin was 12.2 in June 2022.  Chronically on Eliquis.  Denies any melena or rectal bleeding.  Stool dark and heme positive on exam today.  Suspect chronic occult GI bleeding, possibly upper GI source.  Last EGD in 2019 he had medium sized hiatal hernia, large hiatal hernia noted on chest x-ray today.  Possibly bleeding from [place] lesions in the setting of Eliquis.  Remote colonoscopy in 2011, based on EGD findings he may need further work-up.  Plan: Hold Eliquis. Pantoprazole infusions. Transfuse as needed.  EGD once Eliquis has washed out.   We would like to thank you for the opportunity to participate in the care of Neco Kling Canela.  Laureen Ochs. Bernarda Caffey [facility] [phone] 1/12/20231:45 PM    LOS: 0 days

## 2021-08-17 ENCOUNTER — Observation Stay (HOSPITAL_COMMUNITY): Payer: Medicare Other | Admitting: Anesthesiology

## 2021-08-17 ENCOUNTER — Other Ambulatory Visit: Payer: Self-pay

## 2021-08-17 ENCOUNTER — Encounter (HOSPITAL_COMMUNITY): Payer: Self-pay | Admitting: Internal Medicine

## 2021-08-17 ENCOUNTER — Encounter (HOSPITAL_COMMUNITY)
Admission: EM | Disposition: A | Discharge status: Home / Self Care | Payer: Self-pay | Source: Home / Self Care | Attending: Internal Medicine

## 2021-08-17 DIAGNOSIS — E785 Hyperlipidemia, unspecified: Secondary | ICD-10-CM | POA: Diagnosis present

## 2021-08-17 DIAGNOSIS — Z7901 Long term (current) use of anticoagulants: Secondary | ICD-10-CM | POA: Diagnosis not present

## 2021-08-17 DIAGNOSIS — Z887 Allergy status to serum and vaccine status: Secondary | ICD-10-CM | POA: Diagnosis not present

## 2021-08-17 DIAGNOSIS — Z8249 Family history of ischemic heart disease and other diseases of the circulatory system: Secondary | ICD-10-CM | POA: Diagnosis not present

## 2021-08-17 DIAGNOSIS — Z8042 Family history of malignant neoplasm of prostate: Secondary | ICD-10-CM | POA: Diagnosis not present

## 2021-08-17 DIAGNOSIS — Z79899 Other long term (current) drug therapy: Secondary | ICD-10-CM | POA: Diagnosis not present

## 2021-08-17 DIAGNOSIS — Z8546 Personal history of malignant neoplasm of prostate: Secondary | ICD-10-CM | POA: Diagnosis not present

## 2021-08-17 DIAGNOSIS — D62 Acute posthemorrhagic anemia: Secondary | ICD-10-CM | POA: Diagnosis not present

## 2021-08-17 DIAGNOSIS — Z888 Allergy status to other drugs, medicaments and biological substances status: Secondary | ICD-10-CM | POA: Diagnosis not present

## 2021-08-17 DIAGNOSIS — K922 Gastrointestinal hemorrhage, unspecified: Secondary | ICD-10-CM

## 2021-08-17 DIAGNOSIS — Z880 Allergy status to penicillin: Secondary | ICD-10-CM | POA: Diagnosis not present

## 2021-08-17 DIAGNOSIS — Z96661 Presence of right artificial ankle joint: Secondary | ICD-10-CM | POA: Diagnosis present

## 2021-08-17 DIAGNOSIS — I129 Hypertensive chronic kidney disease with stage 1 through stage 4 chronic kidney disease, or unspecified chronic kidney disease: Secondary | ICD-10-CM | POA: Diagnosis present

## 2021-08-17 DIAGNOSIS — I4819 Other persistent atrial fibrillation: Secondary | ICD-10-CM | POA: Diagnosis not present

## 2021-08-17 DIAGNOSIS — K31811 Angiodysplasia of stomach and duodenum with bleeding: Secondary | ICD-10-CM | POA: Diagnosis not present

## 2021-08-17 DIAGNOSIS — N1831 Chronic kidney disease, stage 3a: Secondary | ICD-10-CM | POA: Diagnosis present

## 2021-08-17 DIAGNOSIS — Z8261 Family history of arthritis: Secondary | ICD-10-CM | POA: Diagnosis not present

## 2021-08-17 DIAGNOSIS — R0602 Shortness of breath: Secondary | ICD-10-CM | POA: Diagnosis present

## 2021-08-17 DIAGNOSIS — K921 Melena: Secondary | ICD-10-CM | POA: Diagnosis not present

## 2021-08-17 DIAGNOSIS — K31819 Angiodysplasia of stomach and duodenum without bleeding: Secondary | ICD-10-CM | POA: Diagnosis not present

## 2021-08-17 DIAGNOSIS — Z79890 Hormone replacement therapy (postmenopausal): Secondary | ICD-10-CM | POA: Diagnosis not present

## 2021-08-17 DIAGNOSIS — G629 Polyneuropathy, unspecified: Secondary | ICD-10-CM | POA: Diagnosis present

## 2021-08-17 DIAGNOSIS — I4510 Unspecified right bundle-branch block: Secondary | ICD-10-CM | POA: Diagnosis present

## 2021-08-17 DIAGNOSIS — E039 Hypothyroidism, unspecified: Secondary | ICD-10-CM | POA: Diagnosis not present

## 2021-08-17 DIAGNOSIS — Z20822 Contact with and (suspected) exposure to covid-19: Secondary | ICD-10-CM | POA: Diagnosis present

## 2021-08-17 DIAGNOSIS — K2100 Gastro-esophageal reflux disease with esophagitis, without bleeding: Secondary | ICD-10-CM | POA: Diagnosis present

## 2021-08-17 DIAGNOSIS — Z882 Allergy status to sulfonamides status: Secondary | ICD-10-CM | POA: Diagnosis not present

## 2021-08-17 DIAGNOSIS — N400 Enlarged prostate without lower urinary tract symptoms: Secondary | ICD-10-CM | POA: Diagnosis present

## 2021-08-17 HISTORY — PX: ESOPHAGOGASTRODUODENOSCOPY (EGD) WITH PROPOFOL: SHX5813

## 2021-08-17 HISTORY — PX: HOT HEMOSTASIS: SHX5433

## 2021-08-17 LAB — TYPE AND SCREEN
ABO/RH(D): O POS
Antibody Screen: NEGATIVE
Unit division: 0
Unit division: 0

## 2021-08-17 LAB — TRANSFUSION REACTION
DAT C3: NEGATIVE
Post RXN DAT IgG: NEGATIVE

## 2021-08-17 LAB — CBC
HCT: 28.8 % — ABNORMAL LOW (ref 39.0–52.0)
Hemoglobin: 8.7 g/dL — ABNORMAL LOW (ref 13.0–17.0)
MCH: 24.3 pg — ABNORMAL LOW (ref 26.0–34.0)
MCHC: 30.2 g/dL (ref 30.0–36.0)
MCV: 80.4 fL (ref 80.0–100.0)
Platelets: 345 10*3/uL (ref 150–400)
RBC: 3.58 MIL/uL — ABNORMAL LOW (ref 4.22–5.81)
RDW: 15.4 % (ref 11.5–15.5)
WBC: 7.6 10*3/uL (ref 4.0–10.5)
nRBC: 0 % (ref 0.0–0.2)

## 2021-08-17 LAB — BASIC METABOLIC PANEL
Anion gap: 5 (ref 5–15)
BUN: 15 mg/dL (ref 8–23)
CO2: 24 mmol/L (ref 22–32)
Calcium: 8.8 mg/dL — ABNORMAL LOW (ref 8.9–10.3)
Chloride: 113 mmol/L — ABNORMAL HIGH (ref 98–111)
Creatinine, Ser: 1.19 mg/dL (ref 0.61–1.24)
GFR, Estimated: >60 mL/min (ref >60)
Glucose, Bld: 105 mg/dL — ABNORMAL HIGH (ref 70–99)
Potassium: 4.5 mmol/L (ref 3.5–5.1)
Sodium: 142 mmol/L (ref 135–145)

## 2021-08-17 LAB — BPAM RBC
Blood Product Expiration Date: 202302032359
Blood Product Expiration Date: 202302032359
ISSUE DATE / TIME: 202301121439
ISSUE DATE / TIME: 202301121952
Unit Type and Rh: 5100
Unit Type and Rh: 5100

## 2021-08-17 SURGERY — ESOPHAGOGASTRODUODENOSCOPY (EGD) WITH PROPOFOL
Anesthesia: General

## 2021-08-17 MED ORDER — PROPOFOL 10 MG/ML IV BOLUS
INTRAVENOUS | Status: DC | PRN
  Administered 2021-08-17: 80 mg via INTRAVENOUS
  Administered 2021-08-17: 30 mg via INTRAVENOUS
  Administered 2021-08-17 (×2): 10 mg via INTRAVENOUS

## 2021-08-17 MED ORDER — POLYETHYLENE GLYCOL 3350 17 G PO PACK
17.0000 g | PACK | Freq: Every day | ORAL | Status: DC
  Administered 2021-08-17: 17 g via ORAL
  Filled 2021-08-17 (×2): qty 1

## 2021-08-17 MED ORDER — LACTATED RINGERS IV SOLN: INTRAVENOUS | Status: DC

## 2021-08-17 MED ORDER — SENNA 8.6 MG PO TABS
2.0000 | ORAL_TABLET | Freq: Every day | ORAL | Status: DC
  Administered 2021-08-17: 17.2 mg via ORAL
  Filled 2021-08-17 (×2): qty 2

## 2021-08-17 MED ORDER — LIDOCAINE HCL (CARDIAC) PF 100 MG/5ML IV SOSY
PREFILLED_SYRINGE | INTRAVENOUS | Status: DC | PRN
  Administered 2021-08-17: 50 mg via INTRAVENOUS

## 2021-08-17 MED ORDER — SODIUM CHLORIDE 0.9 % IV SOLN: INTRAVENOUS | Status: DC

## 2021-08-17 NOTE — Anesthesia Postprocedure Evaluation (Signed)
Anesthesia Post Note  Patient: Brent Wilkins  Procedure(s) Performed: ESOPHAGOGASTRODUODENOSCOPY (EGD) WITH PROPOFOL HOT HEMOSTASIS (ARGON PLASMA COAGULATION/BICAP)  Patient location during evaluation: Phase II Anesthesia Type: General Level of consciousness: awake Pain management: pain level controlled Vital Signs Assessment: post-procedure vital signs reviewed and stable Respiratory status: spontaneous breathing and respiratory function stable Cardiovascular status: blood pressure returned to baseline and stable Postop Assessment: no headache and no apparent nausea or vomiting Anesthetic complications: no Comments: Late entry   No notable events documented.   Last Vitals:  Vitals:   08/17/21 1215 08/17/21 1238  BP: (!) 151/80 (!) 151/72  Pulse: 64 62  Resp: 14 14  Temp:  36.5 C  SpO2: 99% 100%    Last Pain:  Vitals:   08/17/21 1238  TempSrc: Oral  PainSc:                  Louann Sjogren

## 2021-08-17 NOTE — Anesthesia Preprocedure Evaluation (Signed)
Anesthesia Evaluation  Patient identified by MRN, date of birth, ID band Patient awake    Reviewed: Allergy & Precautions, H&P , NPO status , Patient's Chart, lab work & pertinent test results, reviewed documented beta blocker date and time   Airway Mallampati: II  TM Distance: >3 FB Neck ROM: full    Dental no notable dental hx.    Pulmonary neg pulmonary ROS,    Pulmonary exam normal breath sounds clear to auscultation       Cardiovascular Exercise Tolerance: Good hypertension, negative cardio ROS   Rhythm:regular Rate:Normal     Neuro/Psych  Neuromuscular disease negative psych ROS   GI/Hepatic Neg liver ROS, GERD  Medicated,  Endo/Other  Hypothyroidism   Renal/GU CRFRenal disease  negative genitourinary   Musculoskeletal   Abdominal   Peds  Hematology  (+) Blood dyscrasia, anemia ,   Anesthesia Other Findings 1. Left ventricular ejection fraction, by estimation, is >75%. The left  ventricle has normal function. The left ventricle has no regional wall  motion abnormalities. There is mild left ventricular hypertrophy. Left  ventricular diastolic parameters are  consistent with Grade II diastolic dysfunction (pseudonormalization).  Elevated left atrial pressure.  2. Right ventricular systolic function is normal. The right ventricular  size is normal.  3. Left atrial size was severely dilated.  4. Right atrial size was mildly dilated.  5. The mitral valve is normal in structure. No evidence of mitral valve  regurgitation. No evidence of mitral stenosis.  6. The aortic valve is tricuspid. Aortic valve regurgitation is not  visualized. No aortic stenosis is present.  7. The inferior vena cava is normal in size with greater than 50%  respiratory variability, suggesting right atrial pressure of 3 mmHg.   Reproductive/Obstetrics negative OB ROS                              Anesthesia Physical Anesthesia Plan  ASA: 4 and emergent  Anesthesia Plan: General   Post-op Pain Management:    Induction:   PONV Risk Score and Plan: Propofol infusion  Airway Management Planned:   Additional Equipment:   Intra-op Plan:   Post-operative Plan:   Informed Consent: I have reviewed the patients History and Physical, chart, labs and discussed the procedure including the risks, benefits and alternatives for the proposed anesthesia with the patient or authorized representative who has indicated his/her understanding and acceptance.     Dental Advisory Given  Plan Discussed with: CRNA  Anesthesia Plan Comments:         Anesthesia Quick Evaluation

## 2021-08-17 NOTE — Interval H&P Note (Signed)
History and Physical Interval Note:  08/17/2021 11:50 AM  Brent Wilkins  has presented today for surgery, with the diagnosis of dark stools, heme +, symptomatic microcytic anemia, on eliquis.  The various methods of treatment have been discussed with the patient and family. After consideration of risks, benefits and other options for treatment, the patient has consented to  Procedure(s): ESOPHAGOGASTRODUODENOSCOPY (EGD) WITH PROPOFOL (N/A) as a surgical intervention.  The patient's history has been reviewed, patient examined, no change in status, stable for surgery.  I have reviewed the patient's chart and labs.  Questions were answered to the patient's satisfaction.     Eloise Harman

## 2021-08-17 NOTE — Progress Notes (Signed)
PROGRESS NOTE  Brent Wilkins TRR:[id number] DOB: [birth year] DOA: 08/16/2021 PCP: Pcp, No  Brief History:  81 y.o. 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  He states that the symptoms have been going on for a couple months, but has steadily worsened to the point where he is having difficulty standing without any dizziness.  He denies any fevers, chills, headache, chest pain, nausea, vomiting, diarrhea, abdominal pain.  He had an appointment at the electrophysiology office on 08/15/2021 where he voiced similar complaints.  Routine blood work was obtained and showed hemoglobin 7.6 which was a significant drop from 12.2 on 01/11/2021.  The patient is not aware of any medication, melena, hematemesis, hematuria.  He denies any NSAIDs.  He endorses compliance with all his medications.  During his cardiology appointment on 08/15/2021, he was noted to be orthostatic.  His amlodipine was discontinued and routine blood work was obtained as discussed above.  His last dose of apixaban was on the evening of 08/15/2021. ED In the ED, the patient was afebrile hemodynamically stable with oxygen saturation 100% room air.  BMP showed a sodium 141, potassium 4.3, bicarbonate 24, serum creatinine 1.26.  AST 17, ALT 7, alk phosphatase 60, total bilirubin 0.5.  WBC is 8.7, hemoglobin 7.2, platelets 400,000.  Chest x-ray showed a large hiatus hernia but no pulmonary edema or consolidation.  EKG showed atrial fibrillation with right bundle branch block.  GI was consulted assist with management. 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   CKD stage IIIa -Baseline creatinine 1.1-1.4   Persistent atrial fibrillation -Continue dofetilide -Optimize electrolytes -Status post ablation 10/06/2019 -Holding apixaban   Essential hypertension -Amlodipine was discontinued by cardiology 08/15/2021 secondary to orthostasis -Continue losartan   BPH -Continue tamsulosin and finasteride   Hypothyroidism -Continue Synthroid            Family Communication:   spouse updated at bedside 1/13  Consultants:  GI  Code Status:  FULL   DVT Prophylaxis:  SCD--holding apixaban   Procedures: As Listed in Progress Note Above  Antibiotics: None       Subjective: Patient denies fevers, chills, headache, chest pain, dyspnea, nausea, vomiting, diarrhea, abdominal pain, dysuria, hematuria, hematochezia, and melena.   Objective: Vitals:   08/17/21 1054 08/17/21 1205 08/17/21 1215 08/17/21 1238  BP: (!) 172/98 (!) 137/92 (!) 151/80 (!) 151/72  Pulse: 63 64 64 62  Resp: _0 Temp: 97.8 F (36.6 C) 98.5 F (36.9 C)  97.7 F (36.5 C)  TempSrc:    Oral  SpO2: 98% 100% 99% 100%  Weight:      Height:        Intake/Output Summary (Last 24 hours) at 08/17/2021 1751 Last data filed at 08/17/2021 1202 Gross per 24 hour  Intake 1186 ml  Output 0 ml  Net 1186 ml   Weight change:  Exam:  General:  Pt is alert, follows commands appropriately, not in acute distress HEENT: No icterus, No thrush, No neck mass, [place]/AT Cardiovascular: RRR, S1/S2, no  rubs, no gallops Respiratory: CTA bilaterally, no wheezing, no crackles, no rhonchi Abdomen: Soft/+BS, non tender, non distended, no guarding Extremities: No edema, No lymphangitis, No petechiae, No rashes, no synovitis   Data Reviewed: I have personally reviewed following labs and  imaging studies Basic Metabolic Panel: Recent Labs  Lab 08/15/21 0850 08/16/21 0935 08/17/21 0102  NA 139 141 142  K 4.5 4.3 4.5  CL 106 110 113*  CO2 _0 GLUCOSE 108* 118* 105*  BUN _1 CREATININE 1.27 1.26* 1.19  CALCIUM 9.2 9.0 8.8*  MG 2.2  --   --    Liver Function Tests: Recent Labs  Lab 08/16/21 0935  AST 17  ALT 7  ALKPHOS 60  BILITOT 0.5  PROT 6.3*  ALBUMIN 3.3*   No results for input(s): LIPASE, AMYLASE in the last 168 hours. No results for input(s): AMMONIA in the last 168 hours. Coagulation Profile: Recent Labs  Lab 08/16/21 0935  INR 1.2   CBC: Recent Labs  Lab 08/15/21 0850 08/16/21 0935 08/17/21 0102  WBC 9.6 8.7 7.6  NEUTROABS  --  6.3  --   HGB 7.6* 7.2* 8.7*  HCT 26.1* 24.8* 28.8*  MCV 77* 80.5 80.4  PLT 466* 400 345   Cardiac Enzymes: No results for input(s): CKTOTAL, CKMB, CKMBINDEX, TROPONINI in the last 168 hours. BNP: Invalid input(s): POCBNP CBG: No results for input(s): GLUCAP in the last 168 hours. HbA1C: No results for input(s): HGBA1C in the last 72 hours. Urine analysis:    Component Value Date/Time   COLORURINE YELLOW 03/20/2017 1845   APPEARANCEUR CLEAR 03/20/2017 1845   LABSPEC 1.024 03/20/2017 1845   PHURINE 5.0 03/20/2017 1845   GLUCOSEU NEGATIVE 03/20/2017 1845   HGBUR NEGATIVE 03/20/2017 1845   BILIRUBINUR NEGATIVE 03/20/2017 1845   KETONESUR NEGATIVE 03/20/2017 1845   PROTEINUR NEGATIVE 03/20/2017 1845   NITRITE NEGATIVE 03/20/2017 1845   LEUKOCYTESUR NEGATIVE 03/20/2017 1845   Sepsis Labs: _2 (procalcitonin:4,lacticidven:4) ) Recent Results (from the past 240 hour(s))  Resp Panel by RT-PCR (Flu A&B, Covid) Nasopharyngeal Swab     Status: None   Collection Time: 08/16/21 11:23 AM   Specimen: Nasopharyngeal Swab; Nasopharyngeal(NP) swabs in vial transport medium  Result Value Ref Range Status   SARS Coronavirus 2 by RT PCR NEGATIVE NEGATIVE Final    Comment: (NOTE) SARS-CoV-2  target nucleic acids are NOT DETECTED.  The SARS-CoV-2 RNA is generally detectable in upper respiratory specimens during the acute phase of infection. The lowest concentration of SARS-CoV-2 viral copies this assay can detect is 138 copies/mL. A negative result does not preclude SARS-Cov-2 infection and should not be used as the sole basis for treatment or other patient management decisions. A negative result may occur with  improper specimen collection/handling, submission of specimen other than nasopharyngeal swab, presence of viral mutation(s) within the areas targeted by this assay, and inadequate number of viral copies(<138 copies/mL). A negative result must be combined with clinical observations, patient history, and epidemiological information. The expected result is Negative.  Fact Sheet for Patients:  EntrepreneurPulse.com.au  Fact Sheet for Healthcare Providers:  IncredibleEmployment.be  This test is no t yet approved or cleared by the [place] FDA and  has been authorized for detection and/or diagnosis of SARS-CoV-2 by FDA under an Emergency Use Authorization (EUA). This EUA will remain  in effect (meaning this test can be used) for the duration of the COVID-19 declaration under Section 564(b)(1) of the Act, 21 U.S.C.section 360bbb-3(b)(1), unless the  authorization is terminated  or revoked sooner.       Influenza A by PCR NEGATIVE NEGATIVE Final   Influenza B by PCR NEGATIVE NEGATIVE Final    Comment: (NOTE) The Xpert Xpress SARS-CoV-2/FLU/RSV plus assay is intended as an aid in the diagnosis of influenza from Nasopharyngeal swab specimens and should not be used as a sole basis for treatment. Nasal washings and aspirates are unacceptable for Xpert Xpress SARS-CoV-2/FLU/RSV testing.  Fact Sheet for Patients: EntrepreneurPulse.com.au  Fact Sheet for Healthcare  Providers: IncredibleEmployment.be  This test is not yet approved or cleared by the [place] FDA and has been authorized for detection and/or diagnosis of SARS-CoV-2 by FDA under an Emergency Use Authorization (EUA). This EUA will remain in effect (meaning this test can be used) for the duration of the COVID-19 declaration under Section 564(b)(1) of the Act, 21 U.S.C. section 360bbb-3(b)(1), unless the authorization is terminated or revoked.  Performed at [facility], [address], [place], [place] [address]      Scheduled Meds:  dofetilide  500 mcg Oral BID   feeding supplement  1 Container Oral TID BM   finasteride  5 mg Oral Daily   fluticasone  2 spray Each Nare Daily   gabapentin  300 mg Oral BID   levothyroxine  75 mcg Oral Daily   losartan  50 mg Oral Daily   montelukast  10 mg Oral QHS   omega-3 acid ethyl esters  1,000 mg Oral BID   polyethylene glycol  17 g Oral Daily   senna  2 tablet Oral Daily   tamsulosin  0.4 mg Oral QPC breakfast   Continuous Infusions:  sodium chloride 120 mL/hr at 08/16/21 1514   pantoprazole 8 mg/hr (08/17/21 1418)    Procedures/Studies: DG Chest Port 1 View  Result Date: 08/16/2021 CLINICAL DATA:  Chest pain. EXAM: PORTABLE CHEST 1 VIEW COMPARISON:  March 20, 2017. FINDINGS: Stable cardiomediastinal silhouette. Large hiatal hernia is noted. Both lungs are clear. The visualized skeletal structures are unremarkable. IMPRESSION: Large hiatal hernia.  No acute abnormality seen. Electronically Signed   By: Marijo Conception M.D.   On: 08/16/2021 09:30    Orson Eva, DO  [facility]  If 7PM-7AM, please contact night-coverage [email or web address] Password [email or web address] 08/17/2021, 5:51 PM   LOS: 0 days

## 2021-08-17 NOTE — Plan of Care (Signed)

## 2021-08-17 NOTE — Op Note (Signed)
[facility] Patient Name: Brent Wilkins Procedure Date: 08/17/2021 11:44 AM MRN: [id number] Date of Birth: 11/27/1940 Attending MD: Elon Alas. Abbey Chatters DO CSN: [id number] Age: 81 Admit Type: Inpatient Procedure:                Upper GI endoscopy Indications:              Acute post hemorrhagic anemia, Melena Providers:                Elon Alas. Abbey Chatters, DO, Janeece Riggers, RN, Kristine L.                            Risa Grill, Technician Referring MD:              Medicines:                See the Anesthesia note for documentation of the                            administered medications Complications:            No immediate complications. Estimated Blood Loss:     Estimated blood loss was minimal. Procedure:                Pre-Anesthesia Assessment:                           - The anesthesia plan was to use monitored                            anesthesia care (MAC).                           After obtaining informed consent, the endoscope was                            passed under direct vision. Throughout the                            procedure, the patient's blood pressure, pulse, and                            oxygen saturations were monitored continuously. The                            [id number]-[id number] ([id number]) scope was introduced through the                            mouth, and advanced to the second part of duodenum.                            The upper GI endoscopy was accomplished without                            difficulty. The patient tolerated the procedure                            well.  Scope In: 11:53:41 AM Scope Out: 12:00:11 PM Total Procedure Duration: 0 hours 6 minutes 30 seconds  Findings:      A medium-sized hiatal hernia was present.      There is no endoscopic evidence of bleeding, areas of erosion,       esophagitis, ulcerations or varices in the entire esophagus.      Moderate nodular gastric antral vascular ectasia with small spot of       active  ooizing was present in the gastric antrum. Coagulation for       hemostasis using argon plasma was successful. APC was then used to treat       remaining portions of GAVE to reduce risk of future bleeding.      The duodenal bulb, first portion of the duodenum and second portion of       the duodenum were normal. Impression:               - Medium-sized hiatal hernia.                           - Gastric antral vascular ectasia with bleeding.                            Treated with argon plasma coagulation (APC).                           - Normal duodenal bulb, first portion of the                            duodenum and second portion of the duodenum.                           - No specimens collected. Moderate Sedation:      Per Anesthesia Care Recommendation:           - Return patient to hospital ward for ongoing care.                           - Soft diet.                           - Use a proton pump inhibitor PO BID. Procedure Code(s):        --- Professional ---                           [phone], Esophagogastroduodenoscopy, flexible,                            transoral; with control of bleeding, any method Diagnosis Code(s):        --- Professional ---                           K44.9, Diaphragmatic hernia without obstruction or                            gangrene  K31.811, Angiodysplasia of stomach and duodenum                            with bleeding                           D62, Acute posthemorrhagic anemia                           K92.1, Melena (includes Hematochezia) CPT copyright 2019 American Medical Association. All rights reserved. The codes documented in this report are preliminary and upon coder review may  be revised to meet current compliance requirements. Elon Alas. Abbey Chatters, DO [place] Abbey Chatters, DO 08/17/2021 12:07:38 PM This report has been signed electronically. Number of Addenda: 0

## 2021-08-17 NOTE — Transfer of Care (Signed)
Immediate Anesthesia Transfer of Care Note  Patient: Brent Wilkins  Procedure(s) Performed: ESOPHAGOGASTRODUODENOSCOPY (EGD) WITH PROPOFOL HOT HEMOSTASIS (ARGON PLASMA COAGULATION/BICAP)  Patient Location: PACU  Anesthesia Type:General  Level of Consciousness: awake, alert  and sedated  Airway & Oxygen Therapy: Patient Spontanous Breathing  Post-op Assessment: Report given to RN, Post -op Vital signs reviewed and stable, Patient moving all extremities X 4 and Patient able to stick tongue midline  Post vital signs: Reviewed  Last Vitals:  Vitals Value Taken Time  BP 137/92   Temp 98.6   Pulse 73   Resp 15   SpO2 99     Last Pain:  Vitals:   08/17/21 1052  TempSrc:   PainSc: 0-No pain         Complications: No notable events documented.

## 2021-08-17 NOTE — Progress Notes (Signed)
°  Transition of Care [facility]) Screening Note   Patient Details  Name: Brent Wilkins Date of Birth: [birth year]   Transition of Care [facility]) CM/SW Contact:    Ihor Gully, LCSW Phone Number: 08/17/2021, 1:57 PM    Transition of Care Department [facility]) has reviewed patient and no TOC needs have been identified at this time. We will continue to monitor patient advancement through interdisciplinary progression rounds. If new patient transition needs arise, please place a TOC consult.   Dellanira Dillow, Clydene Pugh, LCSW

## 2021-08-18 ENCOUNTER — Telehealth: Payer: Self-pay | Admitting: Internal Medicine

## 2021-08-18 DIAGNOSIS — K31819 Angiodysplasia of stomach and duodenum without bleeding: Secondary | ICD-10-CM

## 2021-08-18 DIAGNOSIS — K922 Gastrointestinal hemorrhage, unspecified: Secondary | ICD-10-CM

## 2021-08-18 DIAGNOSIS — D62 Acute posthemorrhagic anemia: Secondary | ICD-10-CM

## 2021-08-18 LAB — CBC
HCT: 28.3 % — ABNORMAL LOW (ref 39.0–52.0)
Hemoglobin: 8.2 g/dL — ABNORMAL LOW (ref 13.0–17.0)
MCH: 23.3 pg — ABNORMAL LOW (ref 26.0–34.0)
MCHC: 29 g/dL — ABNORMAL LOW (ref 30.0–36.0)
MCV: 80.4 fL (ref 80.0–100.0)
Platelets: 327 10*3/uL (ref 150–400)
RBC: 3.52 MIL/uL — ABNORMAL LOW (ref 4.22–5.81)
RDW: 15.9 % — ABNORMAL HIGH (ref 11.5–15.5)
WBC: 7 10*3/uL (ref 4.0–10.5)
nRBC: 0 % (ref 0.0–0.2)

## 2021-08-18 LAB — BASIC METABOLIC PANEL
Anion gap: 7 (ref 5–15)
BUN: 13 mg/dL (ref 8–23)
CO2: 24 mmol/L (ref 22–32)
Calcium: 8.9 mg/dL (ref 8.9–10.3)
Chloride: 111 mmol/L (ref 98–111)
Creatinine, Ser: 1.26 mg/dL — ABNORMAL HIGH (ref 0.61–1.24)
GFR, Estimated: 57 mL/min — ABNORMAL LOW (ref >60)
Glucose, Bld: 88 mg/dL (ref 70–99)
Potassium: 4.1 mmol/L (ref 3.5–5.1)
Sodium: 142 mmol/L (ref 135–145)

## 2021-08-18 MED ORDER — APIXABAN 5 MG PO TABS: 5.0000 mg | ORAL_TABLET | Freq: Two times a day (BID) | ORAL | 1 refills | Status: DC

## 2021-08-18 MED ORDER — FERROUS SULFATE 325 (65 FE) MG PO TABS
325.0000 mg | ORAL_TABLET | Freq: Every day | ORAL | Status: DC
Start: 2021-08-19 — End: 2021-08-18

## 2021-08-18 MED ORDER — FERROUS SULFATE 325 (65 FE) MG PO TABS: 325.0000 mg | ORAL_TABLET | Freq: Every day | ORAL | 3 refills | Status: DC

## 2021-08-18 NOTE — Progress Notes (Signed)
Subjective: Patient doing well today.  Notes normal bowel movement.  No melena hematochezia.  Hemoglobin stable.  Tolerating diet without any issues.  Wishes to go home.  Objective: Vital signs in last 24 hours: Temp:  [97.7 F (36.5 C)-98.8 F (37.1 C)] 98.4 F (36.9 C) (01/14 0814) Pulse Rate:  [59-69] 69 (01/14 0814) Resp:  [11-18] 16 (01/14 0814) BP: (96-172)/(71-125) 150/71 (01/14 0814) SpO2:  [97 %-100 %] 100 % (01/14 0814) Last BM Date: 08/17/21 General:   Alert and oriented, pleasant Head:  Normocephalic and atraumatic. Eyes:  No icterus, sclera clear. Conjuctiva pink.  Abdomen:  Bowel sounds present, soft, non-tender, non-distended. No HSM or hernias noted. No rebound or guarding. No masses appreciated  Msk:  Symmetrical without gross deformities. Normal posture. Extremities:  Without clubbing or edema. Neurologic:  Alert and  oriented x4;  grossly normal neurologically. Skin:  Warm and dry, intact without significant lesions.  Cervical Nodes:  No significant cervical adenopathy. Psych:  Alert and cooperative. Normal mood and affect.  Intake/Output from previous day: 01/13 0701 - 01/14 0700 In: 580 [P.O.:480; I.V.:100] Out: 0  Intake/Output this shift: Total I/O In: 360 [P.O.:360] Out: -   Lab Results: Recent Labs    08/16/21 0935 08/17/21 0102 08/18/21 0401  WBC 8.7 7.6 7.0  HGB 7.2* 8.7* 8.2*  HCT 24.8* 28.8* 28.3*  PLT 400 345 327   BMET Recent Labs    08/16/21 0935 08/17/21 0102 08/18/21 0401  NA 141 142 142  K 4.3 4.5 4.1  CL 110 113* 111  CO2 24 24 24   GLUCOSE 118* 105* 88  BUN 20 15 13   CREATININE 1.26* 1.19 1.26*  CALCIUM 9.0 8.8* 8.9   LFT Recent Labs    08/16/21 0935  PROT 6.3*  ALBUMIN 3.3*  AST 17  ALT 7  ALKPHOS 60  BILITOT 0.5   PT/INR Recent Labs    08/16/21 0935  LABPROT 15.4*  INR 1.2   Hepatitis Panel No results for input(s): HEPBSAG, HCVAB, HEPAIGM, HEPBIGM in the last 72 hours.   Studies/Results: No  results found.  Assessment: *Upper GI bleed-source GAVE *Acute blood loss anemia due to above *Chronic systemic anticoagulation with Eliquis  Plan: EGD 08/17/2021 showed nodular GAVE with 1 spot which was actively oozing status post APC of oozing lesion and subsequent APC of remainder of nodular GAVE.  Hemoglobin stable today, okay to discharge from GI standpoint.  Recommend holding Eliquis for 2 more days.  Discussed this with patient.  Recommend daily PPI as well as initiating p.o. iron.    He will have CBC next week.  We will arrange outpatient GI follow-up.  He may benefit from repeat EGD in 4 to 6 weeks for repeat treatment of his GAVE.  GI to sign off please call thing questions concerns.  Elon Alas. Abbey Chatters, D.O. Gastroenterology and Hepatology [facility]   LOS: 1 day    08/18/2021, 10:46 AM

## 2021-08-18 NOTE — Progress Notes (Signed)
Stated earlier that he was feeling much better than yesterday when he felt very weak.  Stated bm had no signs of blood or discoloration.  Hoping to go home today.

## 2021-08-18 NOTE — Progress Notes (Signed)
Patient alert and oriented, ambulating in the room. Patient states that he had a bowel movement yesterday and refuses miralax and senokot this morning. Patient states that he is tolerating diet well with no complaints. Patient voices no concerns, only eagerness to be discharged. Will continue to monitor.

## 2021-08-18 NOTE — Discharge Summary (Signed)
Physician Discharge Summary  Brent Wilkins MLY:[id number] DOB: February 09, 1941 DOA: 08/16/2021  PCP: Pcp, No  Admit date: 08/16/2021 Discharge date: 08/18/2021  Admitted From: Home Disposition:  Home   Recommendations for Outpatient Follow-up:  Follow up with PCP in 1-2 weeks Please obtain BMP/CBC in one week     Discharge Condition: Stable CODE STATUS:FULL Diet recommendation: Heart Healthy    Brief/Interim Summary: 81 y.o. 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  He states that the symptoms have been going on for a couple months, but has steadily worsened to the point where he is having difficulty standing without any dizziness.  He denies any fevers, chills, headache, chest pain, nausea, vomiting, diarrhea, abdominal pain.  He had an appointment at the electrophysiology office on 08/15/2021 where he voiced similar complaints.  Routine blood work was obtained and showed hemoglobin 7.6 which was a significant drop from 12.2 on 01/11/2021.  The patient is not aware of any medication, melena, hematemesis, hematuria.  He denies any NSAIDs.  He endorses compliance with all his medications.  During his cardiology appointment on 08/15/2021, he was noted to be orthostatic.  His amlodipine was discontinued and routine blood work was obtained as discussed above.  His last dose of apixaban was on the evening of 08/15/2021. ED In the ED, the patient was afebrile hemodynamically stable with oxygen saturation 100% room air.  BMP showed a sodium 141, potassium 4.3, bicarbonate 24, serum creatinine 1.26.  AST 17, ALT 7, alk phosphatase 60, total bilirubin 0.5.  WBC is 8.7, hemoglobin 7.2, platelets 400,000.  Chest x-ray showed a large hiatus hernia but no pulmonary edema or consolidation.  EKG showed atrial  fibrillation with right bundle branch block.  GI was consulted assist with management. 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 Rosemary Holms to d/c with follow up outpt   CKD stage IIIa -Baseline creatinine 1.1-1.4   Persistent atrial fibrillation -Continue dofetilide -Optimize electrolytes -Status post ablation 10/06/2019 -Holding apixaban>>per GI can be restarted on 08/21/21   Essential hypertension -Amlodipine was discontinued by cardiology 08/15/2021 secondary to orthostasis -Continue losartan   BPH -Continue tamsulosin and finasteride   Hypothyroidism -Continue Synthroid    Discharge Instructions   Allergies as of 08/18/2021       Reactions   Tetanus Toxoids Swelling   Face swelling   Statins Other (See Comments)   Hard to walk, severe leg cramps Other reaction(s): Other (See Comments) Muscle weakness "unable to walk"   Dicyclomine Other (See Comments)   constipation   Sulfamethoxazole    Other reaction(s): Other (See Comments) unknown   Doxycycline Rash   Rash only   Oxycodone Other (See Comments)   Severe constipation   Penicillins Swelling, Rash   Took as a child, developed rash over time Has patient had a PCN reaction causing immediate rash, facial/tongue/throat swelling, SOB or lightheadedness with hypotension: Yes Has patient had a PCN reaction causing severe rash involving mucus membranes  or skin necrosis: Unknown Has patient had a PCN  reaction that required hospitalization: No Has patient had a PCN reaction occurring within the last 10 years: No If all of the above answers are "NO", then may proceed with Cephalosporin use. Other reaction(s): Other (See Comments) unknown   Sulfa Antibiotics Swelling, Rash   Xarelto [rivaroxaban] Rash   rash        Medication List     STOP taking these medications    ibuprofen 200 MG tablet Commonly known as: ADVIL       TAKE these medications    apixaban 5 MG Tabs tablet Commonly known as: Eliquis Take 1 tablet (5 mg total) by mouth 2 (two) times daily. Restart 08/21/2021 What changed:  how much to take additional instructions   diphenhydramine-acetaminophen 25-500 MG Tabs tablet Commonly known as: TYLENOL PM Take 1 tablet by mouth at bedtime as needed (sleep and mild discomfort).   dofetilide 500 MCG capsule Commonly known as: TIKOSYN Take 1 capsule (500 mcg total) by mouth 2 (two) times daily.   ferrous sulfate 325 (65 FE) MG tablet Take 1 tablet (325 mg total) by mouth daily with breakfast. Start taking on: August 19, 2021   finasteride 5 MG tablet Commonly known as: PROSCAR Take 1 tablet by mouth daily.   fluticasone 50 MCG/ACT nasal spray Commonly known as: FLONASE USE 1 SPRAY IN BOTH  NOSTRILS DAILY AS NEEDED  FOR ALLERGIES   gabapentin 300 MG capsule Commonly known as: NEURONTIN Take 300 mg by mouth 2 (two) times daily.   levothyroxine 75 MCG tablet Commonly known as: SYNTHROID TAKE 1 TABLET BY MOUTH  DAILY   loratadine 10 MG tablet Commonly known as: CLARITIN Take 10 mg by mouth daily as needed for allergies.   losartan 50 MG tablet Commonly known as: COZAAR Take 1 tablet (50 mg total) by mouth daily.   montelukast 10 MG tablet Commonly known as: SINGULAIR Take 1 tablet (10 mg total) by mouth at bedtime.   pantoprazole 40 MG tablet Commonly known as: PROTONIX TAKE 1 TABLET BY MOUTH  DAILY BEFORE BREAKFAST   Polyethyl Glycol-Propyl  Glycol 0.4-0.3 % Soln Place 1 drop into both eyes 2 (two) times daily as needed (dry eyes).   RA Fish Oil 1400 MG Cpdr Take 1,400 mg by mouth 2 (two) times daily.   tamsulosin 0.4 MG Caps capsule Commonly known as: FLOMAX Take 0.4 mg by mouth daily after breakfast.        Allergies  Allergen Reactions   Tetanus Toxoids Swelling    Face swelling   Statins Other (See Comments)    Hard to walk, severe leg cramps Other reaction(s): Other (See Comments) Muscle weakness "unable to walk"   Dicyclomine Other (See Comments)    constipation   Sulfamethoxazole     Other reaction(s): Other (See Comments) unknown   Doxycycline Rash    Rash only   Oxycodone Other (See Comments)    Severe constipation   Penicillins Swelling and Rash    Took as a child, developed rash over time Has patient had a PCN reaction causing immediate rash, facial/tongue/throat swelling, SOB or lightheadedness with hypotension: Yes Has patient had a PCN reaction causing severe rash involving mucus membranes or skin necrosis: Unknown Has patient had a PCN reaction that required hospitalization: No Has patient had a PCN reaction occurring within the last 10 years: No If all of the above answers are "NO", then may proceed with Cephalosporin use.  Other reaction(s): Other (  See Comments) unknown   Sulfa Antibiotics Swelling and Rash   Xarelto [Rivaroxaban] Rash    rash    Consultations: GI   Procedures/Studies: DG Chest Port 1 View  Result Date: 08/16/2021 CLINICAL DATA:  Chest pain. EXAM: PORTABLE CHEST 1 VIEW COMPARISON:  March 20, 2017. FINDINGS: Stable cardiomediastinal silhouette. Large hiatal hernia is noted. Both lungs are clear. The visualized skeletal structures are unremarkable. IMPRESSION: Large hiatal hernia.  No acute abnormality seen. Electronically Signed   By: Marijo Conception M.D.   On: 08/16/2021 09:30        Discharge Exam: Vitals:   08/18/21 0603 08/18/21 0814  BP: (!) 147/73 (!)  150/71  Pulse: (!) 59 69  Resp: 18 16  Temp:  98.4 F (36.9 C)  SpO2: 97% 100%   Vitals:   08/17/21 2032 08/17/21 2033 08/18/21 0603 08/18/21 0814  BP: 96/83 (!) 166/125 (!) 147/73 (!) 150/71  Pulse: 63 62 (!) 59 69  Resp: _0 Temp: 98.8 F (37.1 C)   98.4 F (36.9 C)  TempSrc: Oral     SpO2: 100%  97% 100%  Weight:      Height:        General: Pt is alert, awake, not in acute distress Cardiovascular: RRR, S1/S2 +, no rubs, no gallops Respiratory: CTA bilaterally, no wheezing, no rhonchi Abdominal: Soft, NT, ND, bowel sounds + Extremities: no edema, no cyanosis   The results of significant diagnostics from this hospitalization (including imaging, microbiology, ancillary and laboratory) are listed below for reference.    Significant Diagnostic Studies: DG Chest Port 1 View  Result Date: 08/16/2021 CLINICAL DATA:  Chest pain. EXAM: PORTABLE CHEST 1 VIEW COMPARISON:  March 20, 2017. FINDINGS: Stable cardiomediastinal silhouette. Large hiatal hernia is noted. Both lungs are clear. The visualized skeletal structures are unremarkable. IMPRESSION: Large hiatal hernia.  No acute abnormality seen. Electronically Signed   By: Marijo Conception M.D.   On: 08/16/2021 09:30    Microbiology: Recent Results (from the past 240 hour(s))  Resp Panel by RT-PCR (Flu A&B, Covid) Nasopharyngeal Swab     Status: None   Collection Time: 08/16/21 11:23 AM   Specimen: Nasopharyngeal Swab; Nasopharyngeal(NP) swabs in vial transport medium  Result Value Ref Range Status   SARS Coronavirus 2 by RT PCR NEGATIVE NEGATIVE Final    Comment: (NOTE) SARS-CoV-2 target nucleic acids are NOT DETECTED.  The SARS-CoV-2 RNA is generally detectable in upper respiratory specimens during the acute phase of infection. The lowest concentration of SARS-CoV-2 viral copies this assay can detect is 138 copies/mL. A negative result does not preclude SARS-Cov-2 infection and should not be used as the sole basis  for treatment or other patient management decisions. A negative result may occur with  improper specimen collection/handling, submission of specimen other than nasopharyngeal swab, presence of viral mutation(s) within the areas targeted by this assay, and inadequate number of viral copies(<138 copies/mL). A negative result must be combined with clinical observations, patient history, and epidemiological information. The expected result is Negative.  Fact Sheet for Patients:  EntrepreneurPulse.com.au  Fact Sheet for Healthcare Providers:  IncredibleEmployment.be  This test is no t yet approved or cleared by the [place] FDA and  has been authorized for detection and/or diagnosis of SARS-CoV-2 by FDA under an Emergency Use Authorization (EUA). This EUA will remain  in effect (meaning this test can be used) for the duration of the COVID-19 declaration under Section 564(b)(1) of the Act,  21 U.S.C.section 360bbb-3(b)(1), unless the authorization is terminated  or revoked sooner.       Influenza A by PCR NEGATIVE NEGATIVE Final   Influenza B by PCR NEGATIVE NEGATIVE Final    Comment: (NOTE) The Xpert Xpress SARS-CoV-2/FLU/RSV plus assay is intended as an aid in the diagnosis of influenza from Nasopharyngeal swab specimens and should not be used as a sole basis for treatment. Nasal washings and aspirates are unacceptable for Xpert Xpress SARS-CoV-2/FLU/RSV testing.  Fact Sheet for Patients: EntrepreneurPulse.com.au  Fact Sheet for Healthcare Providers: IncredibleEmployment.be  This test is not yet approved or cleared by the [place] FDA and has been authorized for detection and/or diagnosis of SARS-CoV-2 by FDA under an Emergency Use Authorization (EUA). This EUA will remain in effect (meaning this test can be used) for the duration of the COVID-19 declaration under Section 564(b)(1) of the Act, 21  U.S.C. section 360bbb-3(b)(1), unless the authorization is terminated or revoked.  Performed at [facility], [address] [place], [place] [address]      Labs: Basic Metabolic Panel: Recent Labs  Lab 08/15/21 0850 08/16/21 0935 08/17/21 0102 08/18/21 0401  NA 139 141 142 142  K 4.5 4.3 4.5 4.1  CL 106 110 113* 111  CO2 _0 GLUCOSE 108* 118* 105* 88  BUN _1 CREATININE 1.27 1.26* 1.19 1.26*  CALCIUM 9.2 9.0 8.8* 8.9  MG 2.2  --   --   --    Liver Function Tests: Recent Labs  Lab 08/16/21 0935  AST 17  ALT 7  ALKPHOS 60  BILITOT 0.5  PROT 6.3*  ALBUMIN 3.3*   No results for input(s): LIPASE, AMYLASE in the last 168 hours. No results for input(s): AMMONIA in the last 168 hours. CBC: Recent Labs  Lab 08/15/21 0850 08/16/21 0935 08/17/21 0102 08/18/21 0401  WBC 9.6 8.7 7.6 7.0  NEUTROABS  --  6.3  --   --   HGB 7.6* 7.2* 8.7* 8.2*  HCT 26.1* 24.8* 28.8* 28.3*  MCV 77* 80.5 80.4 80.4  PLT 466* 400 345 327   Cardiac Enzymes: No results for input(s): CKTOTAL, CKMB, CKMBINDEX, TROPONINI in the last 168 hours. BNP: Invalid input(s): POCBNP CBG: No results for input(s): GLUCAP in the last 168 hours.  Time coordinating discharge:  36 minutes  Signed:  Orson Eva, DO [facility] Pager: [phone] 08/18/2021, 12:14 PM

## 2021-08-18 NOTE — Telephone Encounter (Signed)
Can you please arrange hospital follow-up for this patient in the next 3 to 4 weeks?  Dr. Gala Romney patient, okay to see APP urgent spot.  Thank you

## 2021-08-18 NOTE — Plan of Care (Signed)

## 2021-08-20 ENCOUNTER — Encounter (HOSPITAL_COMMUNITY): Payer: Self-pay | Admitting: Internal Medicine

## 2021-08-20 ENCOUNTER — Telehealth: Payer: Self-pay | Admitting: Nurse Practitioner

## 2021-08-20 ENCOUNTER — Telehealth: Payer: Self-pay

## 2021-08-20 ENCOUNTER — Ambulatory Visit: Payer: Medicare Other | Admitting: Internal Medicine

## 2021-08-20 NOTE — Telephone Encounter (Signed)
Refer to last tele message

## 2021-08-20 NOTE — Telephone Encounter (Signed)
I have not called pt please see if pt needs anything

## 2021-08-20 NOTE — Telephone Encounter (Signed)
Transition Care Management Follow-up Telephone Call Date of discharge and from where: 08/18/21 [place]  How have you been since you were released from the hospital? Doing fine much better Any questions or concerns? No  Items Reviewed: Did the pt receive and understand the discharge instructions provided? Yes  Medications obtained and verified? Yes  Other? No  Any new allergies since your discharge? No  Dietary orders reviewed? Yes Do you have support at home? Yes   Home Care and Equipment/Supplies: Were home health services ordered? no If so, what is the name of the agency? na  Has the agency set up a time to come to the patient's home? not applicable Were any new equipment or medical supplies ordered?  No What is the name of the medical supply agency? na Were you able to get the supplies/equipment? not applicable Do you have any questions related to the use of the equipment or supplies? No  Functional Questionnaire: (I = Independent and D = Dependent) ADLs: I   Bathing/Dressing- i  Meal Prep- i  Eating- i  Maintaining continence- i  Transferring/Ambulation- i  Managing Meds- i  Follow up appointments reviewed:  PCP Hospital f/u appt confirmed? Yes  Scheduled to see Posey Pronto on 08/23/21 @ 10:40. [facility] f/u appt confirmed? Yes  Scheduled to see cardiology on   09/10/21@ 2:40 pm,. Are transportation arrangements needed? No  If their condition worsens, is the pt aware to call PCP or go to the Emergency Dept.? Yes Was the patient provided with contact information for the PCP's office or ED? Yes Was to pt encouraged to call back with questions or concerns? Yes

## 2021-08-20 NOTE — Telephone Encounter (Signed)
Please send to person doing TOC calls

## 2021-08-20 NOTE — Telephone Encounter (Signed)
TOC completed 

## 2021-08-20 NOTE — Telephone Encounter (Signed)
Transition Care Management Unsuccessful Follow-up Telephone Call  Date of discharge and from where:  08/18/21 [facility]  Attempts:  1st Attempt  Reason for unsuccessful TCM follow-up call:  Left voice message

## 2021-08-20 NOTE — Telephone Encounter (Signed)
It was a return call for surgery update

## 2021-08-20 NOTE — Telephone Encounter (Signed)
Pt returning phone call ° °

## 2021-08-21 ENCOUNTER — Other Ambulatory Visit: Payer: Self-pay | Admitting: Gastroenterology

## 2021-08-21 DIAGNOSIS — K2100 Gastro-esophageal reflux disease with esophagitis, without bleeding: Secondary | ICD-10-CM

## 2021-08-21 DIAGNOSIS — R1310 Dysphagia, unspecified: Secondary | ICD-10-CM

## 2021-08-21 NOTE — Telephone Encounter (Signed)
No, I did his TOC call and that was all I needed

## 2021-08-21 NOTE — Telephone Encounter (Signed)
Per last tele

## 2021-08-22 ENCOUNTER — Ambulatory Visit (INDEPENDENT_AMBULATORY_CARE_PROVIDER_SITE_OTHER): Payer: Medicare Other | Admitting: Internal Medicine

## 2021-08-22 ENCOUNTER — Other Ambulatory Visit: Payer: Self-pay

## 2021-08-22 ENCOUNTER — Encounter: Payer: Self-pay | Admitting: Internal Medicine

## 2021-08-22 VITALS — BP 132/84 | HR 72 | Resp 18 | Ht 71.0 in | Wt 197.1 lb

## 2021-08-22 DIAGNOSIS — Z09 Encounter for follow-up examination after completed treatment for conditions other than malignant neoplasm: Secondary | ICD-10-CM

## 2021-08-22 DIAGNOSIS — I4811 Longstanding persistent atrial fibrillation: Secondary | ICD-10-CM | POA: Diagnosis not present

## 2021-08-22 DIAGNOSIS — J321 Chronic frontal sinusitis: Secondary | ICD-10-CM

## 2021-08-22 DIAGNOSIS — D500 Iron deficiency anemia secondary to blood loss (chronic): Secondary | ICD-10-CM | POA: Diagnosis not present

## 2021-08-22 MED ORDER — NOREL AD 4-10-325 MG PO TABS: 1.0000 | ORAL_TABLET | Freq: Three times a day (TID) | ORAL | 0 refills | Status: DC | PRN

## 2021-08-22 NOTE — Assessment & Plan Note (Addendum)
Due to [facility] EGD -1 oozing lesion treated with APC and showed GAVE S/p PRBC in the hospital Continue iron supplement Check CBC F/u with GI

## 2021-08-22 NOTE — Assessment & Plan Note (Signed)
Recent worsening Uses Flonase Requests refill of Norel, refill provided

## 2021-08-22 NOTE — Patient Instructions (Signed)
Please start taking Eliquis as instructed.  Please continue to take iron supplements.  Please continue to take other medications as prescribed.

## 2021-08-22 NOTE — Progress Notes (Signed)
Established Patient Office Visit  Subjective:  Patient ID: Brent Wilkins, male    DOB: 05-09-1941  Age: 81 y.o. MRN: [id number]  CC:  Chief Complaint  Patient presents with   Transitions Of Care    Pt was discharged 08-18-21 had bleeding vessels in stoamch blood count was down is not back to 100% needs labs drawn today     HPI Brent Wilkins is a 81 y.o. male with past medical history of A. fib, HTN, hypothyroidism, and IDA who presents for f/u after recent hospitalization.  He was admitted from 01/12-01/14 for chronic blood loss anemia secondary to upper GI bleeding.  He was sent to hospital from his EP cardiology office after noting a drop in Hb to 7.6 on 01/11, from 12.2 about 7 months ago.  Patient was having dizziness, fatigue and exertional dyspnea as well.  He received 2 units of PRBC in the hospital, but could not complete second PRBC due to adverse reaction.  He was kept on Protonix drip, later switched to Protonix p.o.  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 He was discharged with oral iron supplements.  His last Hb was 8.2 on the day of discharge.  He denies any melena or hematochezia since being discharged.  He has not started taking Eliquis yet.   Past Medical History:  Diagnosis Date   Acute blood loss anemia 08/16/2021   Allergy    hay fever;  seasonal   Arthritis    right ankle   Cataract    surgery 2016   Chronic kidney disease    stones many years ago   Constipation 12/09/2018   Dizziness 12/13/2015   Dysphagia    Family history of malignant neoplasm of prostate 12/12/2017   Frequent urination at night    GERD (gastroesophageal reflux disease)    History of arthroplasty of right ankle 02/04/2020   History of kidney stones    History of prostate cancer 06/16/2017   HTN (hypertension)    Hyperlipidemia LDL goal <100 10/26/2019   Hypertension    Phreesia 04/24/2020   Hypothyroidism    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 on Tikosyn and Eliquis b. s/p ablation in 10/2019 due to break-through episodes while on Tikosyn.    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 Rosana Hoes   Schatzki's ring 10/22/2011   Seasonal allergies    Syncope  dx. 8 yrs ago   Syncope, cardiogenic 03/20/2017   Vaccine counseling 09/15/2019    Past Surgical History:  Procedure Laterality Date   APPENDECTOMY     ATRIAL FIBRILLATION ABLATION N/A 10/06/2019   Procedure: ATRIAL FIBRILLATION ABLATION;  Surgeon: Constance Haw, MD;  Location: [place] [facility];  Service: Cardiovascular;  Laterality: N/A;   CATARACT EXTRACTION W/PHACO Left 03/27/2015   Procedure: CATARACT EXTRACTION PHACO AND INTRAOCULAR LENS PLACEMENT LEFT EYE CDE=11.73;  Surgeon: Tonny Branch, MD;  Location: [facility];  Service: Ophthalmology;  Laterality: Left;   CATARACT EXTRACTION W/PHACO Right 03/30/2015   Procedure: CATARACT EXTRACTION PHACO AND INTRAOCULAR LENS PLACEMENT RIGHT EYE CDE=7.35;  Surgeon: Tonny Branch, MD;  Location: [facility];  Service: Ophthalmology;  Laterality: Right;   CHOLECYSTECTOMY     COLONOSCOPY  07/23/10   Dr. Vivi Ferns rectum, scattered pancolonic diverticula   COLONOSCOPY  08/10/1999  internal hemorrhoids,inflammatory polyp   ESOPHAGOGASTRODUODENOSCOPY  04/12/08   Prominent Schatzki's ring, erosive reflux esophagitis, multiple antral erosions, small hiatal hernia, reactive gastropathy, status post dilation with 50F   ESOPHAGOGASTRODUODENOSCOPY  10/28/2011   Procedure: ESOPHAGOGASTRODUODENOSCOPY (EGD);  Surgeon: Daneil Dolin, MD;  Location: [facility];  Service: Endoscopy;  Laterality: N/A;  1:30   ESOPHAGOGASTRODUODENOSCOPY N/A 09/10/2017   Dr. Gala Romney: Schatkzi's ring s/p dilation at GE junction, mild esophageal reflux esophagitis, medium sized hiatal hernia, normal duodenum. 56 and 18 Fr dilation    ESOPHAGOGASTRODUODENOSCOPY (EGD) WITH PROPOFOL N/A 08/17/2021   Procedure: ESOPHAGOGASTRODUODENOSCOPY (EGD) WITH PROPOFOL;  Surgeon: Eloise Harman, DO;  [facility];  Service: Endoscopy;  Laterality: N/A;   EYE SURGERY     HOT HEMOSTASIS  08/17/2021   Procedure: HOT HEMOSTASIS (ARGON PLASMA COAGULATION/BICAP);  Surgeon: Eloise Harman, DO;  Location[facility];  Service: Endoscopy;;   JOINT REPLACEMENT N/A    Phreesia 01/15/2020   MALONEY DILATION  10/28/2011   Procedure: Venia Minks DILATION;  Surgeon: Daneil Dolin, MD;  [facility];  Service: Endoscopy;  Laterality: N/A;   MALONEY DILATION N/A 09/10/2017   Procedure: Venia Minks DILATION;  Surgeon: Daneil Dolin, MD;  [facility];  Service: Endoscopy;  Laterality: N/A;   SAVORY DILATION  10/28/2011   Procedure: SAVORY DILATION;  Surgeon: Daneil Dolin, MD;  [facility];  Service: Endoscopy;  Laterality: N/A;   TONSILLECTOMY     TOTAL ANKLE ARTHROPLASTY Left 11/25/2013   Procedure: TOTAL ANKLE ARTHOPLASTY;  Surgeon: Wylene Simmer, MD;  Location: [place];  Service: Orthopedics;  Laterality: Left;   TOTAL ANKLE ARTHROPLASTY Right 07/14/2014   dr hewitt   TOTAL ANKLE ARTHROPLASTY Right 07/14/2014   Procedure: RIGHT TOTAL ANKLE ARTHOPLASTY;  Surgeon: Wylene Simmer, MD;  Location: [place];  Service: Orthopedics;  Laterality: Right;    Family History  Problem Relation Age of Onset   Prostate cancer Father    Cancer Father        prostate to bone   Hypertension Father    Prostate cancer Son 52   ALS Son    Prostate cancer Brother    Heart disease Mother        died at 17   Arthritis Mother    Colon cancer Neg Hx     Social History   Socioeconomic History   Marital status: Married    Spouse name: Candace   Number of children: 2   Years of education: Not on file   Highest education level: Bachelor's degree (e.g., BA, AB, BS)  Occupational History   Occupation: retired; Agricultural consultant: RETIRED  Tobacco Use   Smoking status: Never   Smokeless tobacco: Never  Vaping Use   Vaping Use: Never used  Substance and Sexual Activity   Alcohol use: Yes    Alcohol/week: 2.0 standard drinks    Types: 1 Glasses of wine, 1 Cans of beer per week    Comment: glass of wine or "couple of beers" occasionally    Drug use: No   Sexual activity: Yes    Partners: Female  Other Topics Concern   Not on file  Social History Narrative   Lives with wife Candace.       Moved from [place], [place]   Retired from [employer].   Plays golf.   Works with [employer] and [employer] for [employer].   Wears seatbelt.  Drives.   Eats all food groups.   Never smoked.   Married for over 85 years June  (2020)   Has two grown children, live in [place] and [place].   Social Determinants of Health   Financial Resource Strain: Low Risk    Difficulty of Paying Living Expenses: Not hard at all  Food Insecurity: No Food Insecurity   Worried About Charity fundraiser in the Last Year: Never true   [place] in the Last Year: Never true  Transportation Needs: No Transportation Needs   Lack of Transportation (Medical): No   Lack of Transportation (Non-Medical): No  Physical Activity: Not on file  Stress: Not on file  Social Connections: Socially Integrated   Frequency of Communication with Friends and Family: More than three times a week   Frequency of Social Gatherings with Friends and Family: More than three times a week   Attends Religious Services: More than 4 times per year   Active Member of [employer] or Organizations: Yes   Attends Music therapist: More than 4 times per year   Marital Status: Married  Human resources officer Violence: Not on file    Outpatient Medications Prior to Visit  Medication Sig Dispense Refill   apixaban (ELIQUIS) 5 MG TABS tablet Take 1 tablet (5 mg total) by mouth 2 (two) times daily. Restart 08/21/2021 180 tablet 1   diphenhydramine-acetaminophen  (TYLENOL PM) 25-500 MG TABS tablet Take 1 tablet by mouth at bedtime as needed (sleep and mild discomfort).      dofetilide (TIKOSYN) 500 MCG capsule Take 1 capsule (500 mcg total) by mouth 2 (two) times daily. 180 capsule 2   ferrous sulfate 325 (65 FE) MG tablet Take 1 tablet (325 mg total) by mouth daily with breakfast.  3   finasteride (PROSCAR) 5 MG tablet Take 1 tablet by mouth daily.     fluticasone (FLONASE) 50 MCG/ACT nasal spray USE 1 SPRAY IN BOTH  NOSTRILS DAILY AS NEEDED  FOR ALLERGIES 32 g 2   gabapentin (NEURONTIN) 300 MG capsule Take 300 mg by mouth 2 (two) times daily.      levothyroxine (SYNTHROID) 75 MCG tablet TAKE 1 TABLET BY MOUTH  DAILY 90 tablet 3   loratadine (CLARITIN) 10 MG tablet Take 10 mg by mouth daily as needed for allergies.     losartan (COZAAR) 50 MG tablet Take 1 tablet (50 mg total) by mouth daily. 90 tablet 3   montelukast (SINGULAIR) 10 MG tablet Take 1 tablet (10 mg total) by mouth at bedtime. 30 tablet 3   Omega-3 Fatty Acids (RA FISH OIL) 1400 MG CPDR Take 1,400 mg by mouth 2 (two) times daily.     pantoprazole (PROTONIX) 40 MG tablet TAKE 1 TABLET BY MOUTH  DAILY BEFORE BREAKFAST 90 tablet 3   Polyethyl Glycol-Propyl Glycol 0.4-0.3 % SOLN Place 1 drop into both eyes 2 (two) times daily as needed (dry eyes).      Tamsulosin HCl (FLOMAX) 0.4 MG CAPS Take 0.4 mg by mouth daily after breakfast.      No facility-administered medications prior to visit.    Allergies  Allergen Reactions   Tetanus Toxoids Swelling    Face swelling   Statins Other (See Comments)    Hard to walk, severe leg cramps Other reaction(s): Other (See Comments) Muscle weakness "unable to walk"   Dicyclomine Other (See Comments)    constipation   Sulfamethoxazole     Other reaction(s): Other (See Comments) unknown  Doxycycline Rash    Rash only   Oxycodone Other (See Comments)    Severe constipation   Penicillins Swelling and Rash    Took as a child, developed rash over  time Has patient had a PCN reaction causing immediate rash, facial/tongue/throat swelling, SOB or lightheadedness with hypotension: Yes Has patient had a PCN reaction causing severe rash involving mucus membranes or skin necrosis: Unknown Has patient had a PCN reaction that required hospitalization: No Has patient had a PCN reaction occurring within the last 10 years: No If all of the above answers are "NO", then may proceed with Cephalosporin use.  Other reaction(s): Other (See Comments) unknown   Sulfa Antibiotics Swelling and Rash   Xarelto [Rivaroxaban] Rash    rash    ROS Review of Systems  Constitutional:  Positive for fatigue. Negative for chills and fever.  HENT:  Negative for congestion and sore throat.   Eyes:  Negative for pain and discharge.  Respiratory:  Positive for shortness of breath. Negative for cough.   Cardiovascular:  Negative for chest pain and palpitations.  Gastrointestinal:  Negative for constipation, diarrhea, nausea and vomiting.  Endocrine: Negative for polydipsia and polyuria.  Genitourinary:  Negative for dysuria and hematuria.  Musculoskeletal:  Negative for neck pain and neck stiffness.  Skin:  Negative for rash.  Neurological:  Negative for dizziness, weakness, numbness and headaches.  Psychiatric/Behavioral:  Negative for agitation and behavioral problems.      Objective:    Physical Exam Vitals reviewed.  Constitutional:      General: He is not in acute distress.    Appearance: He is not diaphoretic.  HENT:     Head: Normocephalic and atraumatic.     Nose: Nose normal.     Mouth/Throat:     Mouth: Mucous membranes are moist.  Eyes:     General: No scleral icterus.    Extraocular Movements: Extraocular movements intact.  Cardiovascular:     Rate and Rhythm: Normal rate and regular rhythm.     Pulses: Normal pulses.     Heart sounds: Normal heart sounds. No murmur heard. Pulmonary:     Breath sounds: Normal breath sounds. No wheezing  or rales.  Abdominal:     Palpations: Abdomen is soft.     Tenderness: There is no abdominal tenderness.  Musculoskeletal:     Cervical back: Neck supple. No tenderness.     Right lower leg: No edema.     Left lower leg: No edema.  Skin:    General: Skin is warm.     Findings: No rash.  Neurological:     General: No focal deficit present.     Mental Status: He is alert and oriented to person, place, and time.     Cranial Nerves: No cranial nerve deficit.     Sensory: No sensory deficit.     Motor: No weakness.  Psychiatric:        Mood and Affect: Mood normal.        Behavior: Behavior normal.    BP 132/84 (BP Location: Right Arm, Patient Position: Sitting, Cuff Size: Normal)    Pulse 72    Resp 18    Ht _0  (1.803 m)    Wt 197 lb 1.9 oz (89.4 kg)    SpO2 97%    BMI 27.49 kg/m  Wt Readings from Last 3 Encounters:  08/22/21 197 lb 1.9 oz (89.4 kg)  08/16/21 193 lb 8 oz (87.8 kg)  08/15/21 197  lb (89.4 kg)    Lab Results  Component Value Date   TSH 3.500 10/17/2020   Lab Results  Component Value Date   WBC 7.0 08/18/2021   HGB 8.2 (L) 08/18/2021   HCT 28.3 (L) 08/18/2021   MCV 80.4 08/18/2021   PLT 327 08/18/2021   Lab Results  Component Value Date   NA 142 08/18/2021   K 4.1 08/18/2021   CO2 24 08/18/2021   GLUCOSE 88 08/18/2021   BUN 13 08/18/2021   CREATININE 1.26 (H) 08/18/2021   BILITOT 0.5 08/16/2021   ALKPHOS 60 08/16/2021   AST 17 08/16/2021   ALT 7 08/16/2021   PROT 6.3 (L) 08/16/2021   ALBUMIN 3.3 (L) 08/16/2021   CALCIUM 8.9 08/18/2021   ANIONGAP 7 08/18/2021   EGFR 56 (L) 08/15/2021   Lab Results  Component Value Date   CHOL 170 01/11/2021   Lab Results  Component Value Date   HDL 44 01/11/2021   Lab Results  Component Value Date   LDLCALC 101 (H) 01/11/2021   Lab Results  Component Value Date   TRIG 139 01/11/2021   Lab Results  Component Value Date   CHOLHDL 4.7 04/28/2020   Lab Results  Component Value Date   HGBA1C 5.5  04/28/2020      Assessment & Plan:   Hospital discharge follow-up Hospital chart reviewed including discharge summary 2 U PRBC for IDA due to chronic blood loss from UGIB Check CBC and BMP today  Iron deficiency anemia due to chronic blood loss Due to UGIB EGD -1 oozing lesion treated with APC and showed GAVE S/p PRBC in the hospital Continue iron supplement Check CBC F/u with GI  Atrial fibrillation (HCC) On dofetilide and Eliquis Eliquis held recently due to UGIB Advised to start taking Eliquis now as planned since he does not have any melena or hematochezia currently  Chronic frontal sinusitis Recent worsening Uses Flonase Requests refill of Norel, refill provided    Meds ordered this encounter  Medications   Chlorphen-PE-Acetaminophen (NOREL AD) 4-10-325 MG TABS    Sig: Take 1 tablet by mouth 3 (three) times daily as needed.    Dispense:  30 tablet    Refill:  0    Follow-up: Return in about 3 months (around 11/20/2021) for Annual physical.    Lindell Spar, MD

## 2021-08-22 NOTE — Assessment & Plan Note (Signed)
On dofetilide and Eliquis Eliquis held recently due to UGIB Advised to start taking Eliquis now as planned since he does not have any melena or hematochezia currently

## 2021-08-22 NOTE — Assessment & Plan Note (Signed)
Hospital chart reviewed including discharge summary 2 U PRBC for IDA due to chronic blood loss from UGIB Check CBC and BMP today

## 2021-08-23 ENCOUNTER — Other Ambulatory Visit: Payer: Self-pay | Admitting: *Deleted

## 2021-08-23 ENCOUNTER — Ambulatory Visit: Payer: Medicare Other | Admitting: Internal Medicine

## 2021-08-23 LAB — CBC WITH DIFFERENTIAL/PLATELET
Basophils Absolute: 0.1 10*3/uL (ref 0.0–0.2)
Basos: 1 %
EOS (ABSOLUTE): 0.3 10*3/uL (ref 0.0–0.4)
Eos: 3 %
Hematocrit: 31.3 % — ABNORMAL LOW (ref 37.5–51.0)
Hemoglobin: 9.6 g/dL — ABNORMAL LOW (ref 13.0–17.7)
Immature Grans (Abs): 0 10*3/uL (ref 0.0–0.1)
Immature Granulocytes: 0 %
Lymphocytes Absolute: 1.9 10*3/uL (ref 0.7–3.1)
Lymphs: 19 %
MCH: 23.8 pg — ABNORMAL LOW (ref 26.6–33.0)
MCHC: 30.7 g/dL — ABNORMAL LOW (ref 31.5–35.7)
MCV: 78 fL — ABNORMAL LOW (ref 79–97)
Monocytes Absolute: 0.6 10*3/uL (ref 0.1–0.9)
Monocytes: 6 %
Neutrophils Absolute: 7.3 10*3/uL — ABNORMAL HIGH (ref 1.4–7.0)
Neutrophils: 71 %
Platelets: 451 10*3/uL — ABNORMAL HIGH (ref 150–450)
RBC: 4.04 x10E6/uL — ABNORMAL LOW (ref 4.14–5.80)
RDW: 15.9 % — ABNORMAL HIGH (ref 11.6–15.4)
WBC: 10.2 10*3/uL (ref 3.4–10.8)

## 2021-08-23 LAB — BASIC METABOLIC PANEL
BUN/Creatinine Ratio: 12 (ref 10–24)
BUN: 15 mg/dL (ref 8–27)
CO2: 21 mmol/L (ref 20–29)
Calcium: 9.4 mg/dL (ref 8.6–10.2)
Chloride: 108 mmol/L — ABNORMAL HIGH (ref 96–106)
Creatinine, Ser: 1.26 mg/dL (ref 0.76–1.27)
Glucose: 96 mg/dL (ref 70–99)
Potassium: 4.5 mmol/L (ref 3.5–5.2)
Sodium: 144 mmol/L (ref 134–144)
eGFR: 57 mL/min/{1.73_m2} — ABNORMAL LOW (ref >59)

## 2021-08-23 MED ORDER — LEVOTHYROXINE SODIUM 75 MCG PO TABS: 75.0000 ug | ORAL_TABLET | Freq: Every day | ORAL | 3 refills | Status: DC

## 2021-08-24 ENCOUNTER — Other Ambulatory Visit: Payer: Self-pay | Admitting: *Deleted

## 2021-08-24 DIAGNOSIS — I4811 Longstanding persistent atrial fibrillation: Secondary | ICD-10-CM

## 2021-08-24 MED ORDER — APIXABAN 5 MG PO TABS: 5.0000 mg | ORAL_TABLET | Freq: Two times a day (BID) | ORAL | 1 refills | Status: DC

## 2021-08-24 NOTE — Telephone Encounter (Signed)
Eliquis 5mg  paper refill request received. Patient is 81 years old, weight-89.4kg, Crea-1.26 on 08/22/2021, Diagnosis-Afib, and last seen by Tommye Standard on 08/15/2021. Dose is appropriate based on dosing criteria. Will send in refill to requested pharmacy.

## 2021-09-08 NOTE — Progress Notes (Signed)
Cardiology Office Note Date:  09/10/2021  Patient ID:  Red, Mandt 06-May-1941, MRN [id number] PCP:  Lindell Spar, MD  Cardiologist:  Dr. Harrington Challenger Electrophysiologist: Dr. Curt Bears    Chief Complaint:  f/u  History of Present Illness: Brent Wilkins is a 81 y.o. male with history of HTN, HLD, Afib, RBBB, hypothyroidism, ? Hx of remote syncope and + tilt test  He comes in today to be seen for Dr. Curt Bears, last seen by him 06/05/21, recalls that in April 2020 with an episode of syncope.  He was found to be bradycardic and thus was not on AV nodal blockers.  At the time of his hospitalization he was found to be orthostatic.  He was tired, having standing symptoms of dizziness, more since increased losartan dose.  Noted as well, his son has ALS.  He has lost 75 pounds.  He is getting a feeding tube placed after Thanksgiving His losartan was reduced  Pt/wife calls with reports of dizziness/lightheadedness for a couple weeks, particularly in the AMs,  reports of normal/high BPs followed by lower (ie: 161/70 with HR of 70, then took his BP again following episode of dizziness and reading was 126/70 with HR of 70), SOB with stairs  I saw him 08/15/21 He is accompanied by his wife He has a long hx of orthostatic dizziness, though had been pretty well controlled, or resolved for a long time, last year/spring started getting lightheaded when he was standing still.  This has steadily increased to now not being able to stand at church at all He feels lmostly OK as long as he is moving/walking though of late even then sometimes he has to sit. Once seated he quickly starts to feel better. He feels lightheaded, gets an uncomfortable feeling in his abdomen, wife reports he looks very pale and he says near fainting No syncope No symptoms seated or supine No CP, palpitations, or SOB with these events or otherwise No bleeding or signs of bleeding He was never really aware of his Afib with  palpitations but checks in on h is Kardia device periodically and has not seem any Afib episodes QTc was stable Orthostatics markedly abnormal with reproduction of his symptoms Amlodipine stopped, felt to be exacerbation of his known orthostatic issues Labs done  Turned out to be markedly anemic and referred to the ER Confirmed his new anemia GI consulted 2u PRBC, protonix 08/17/21 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 He is still tired but feeling MUCH better, walking the dog again and very glad to have a good explaination for how he had been feeling His stool is black now but taking Iron. No CP, palpitations, SOB  No near syncope or syncope. No ongoing orthostatic symptoms  Back on Eliquis  Afib hx Diagnosed 2015 Xarelto w/rash Tikosyn started 2018 PVI/CTI ablation 10/06/2019  Past Medical History:  Diagnosis Date   Acute blood loss anemia 08/16/2021   Allergy    hay fever;  seasonal   Arthritis    right ankle   Cataract    surgery 2016   Chronic kidney disease    stones many years ago  Constipation 12/09/2018   Dizziness 12/13/2015   Dysphagia    Family history of malignant neoplasm of prostate 12/12/2017   Frequent urination at night    GERD (gastroesophageal reflux disease)    History of arthroplasty of right ankle 02/04/2020   History of kidney stones    History of prostate cancer 06/16/2017   HTN (hypertension)    Hyperlipidemia LDL goal <100 10/26/2019   Hypertension    Phreesia 04/24/2020   Hypothyroidism    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 on Tikosyn and  Eliquis b. s/p ablation in 10/2019 due to break-through episodes while on Tikosyn.    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 Rosana Hoes   Schatzki's ring 10/22/2011   Seasonal allergies    Syncope  dx. 8 yrs ago   Syncope, cardiogenic 03/20/2017   Vaccine counseling 09/15/2019    Past Surgical History:  Procedure Laterality Date   APPENDECTOMY     ATRIAL FIBRILLATION ABLATION N/A 10/06/2019   Procedure: ATRIAL FIBRILLATION ABLATION;  Surgeon: Constance Haw, MD;  Location: [facility];  Service: Cardiovascular;  Laterality: N/A;   CATARACT EXTRACTION W/PHACO Left 03/27/2015   Procedure: CATARACT EXTRACTION PHACO AND INTRAOCULAR LENS PLACEMENT LEFT EYE CDE=11.73;  Surgeon: Tonny Branch, MD;  Location: [facility];  Service: Ophthalmology;  Laterality: Left;   CATARACT EXTRACTION W/PHACO Right 03/30/2015   Procedure: CATARACT EXTRACTION PHACO AND INTRAOCULAR LENS PLACEMENT RIGHT EYE CDE=7.35;  Surgeon: Tonny Branch, MD;  Location: [facility];  Service: Ophthalmology;  Laterality: Right;   CHOLECYSTECTOMY     COLONOSCOPY  07/23/10   Dr. Vivi Ferns rectum, scattered pancolonic diverticula   COLONOSCOPY  08/10/1999   internal hemorrhoids,inflammatory polyp   ESOPHAGOGASTRODUODENOSCOPY  04/12/08   Prominent Schatzki's ring, erosive reflux esophagitis, multiple antral erosions, small hiatal hernia, reactive gastropathy, status post dilation with 68F   ESOPHAGOGASTRODUODENOSCOPY  10/28/2011   Procedure: ESOPHAGOGASTRODUODENOSCOPY (EGD);  Surgeon: Daneil Dolin, MD;  Location: [facility];  Service: Endoscopy;  Laterality: N/A;  1:30   ESOPHAGOGASTRODUODENOSCOPY N/A 09/10/2017   Dr. Gala Romney: Schatkzi's ring s/p dilation at GE junction, mild esophageal reflux esophagitis, medium sized hiatal hernia, normal duodenum. 56 and 71 Fr dilation   ESOPHAGOGASTRODUODENOSCOPY (EGD) WITH PROPOFOL N/A 08/17/2021   Procedure: ESOPHAGOGASTRODUODENOSCOPY  (EGD) WITH PROPOFOL;  Surgeon: Eloise Harman, DO;  [facility];  Service: Endoscopy;  Laterality: N/A;   EYE SURGERY     HOT HEMOSTASIS  08/17/2021   Procedure: HOT HEMOSTASIS (ARGON PLASMA COAGULATION/BICAP);  Surgeon: Eloise Harman, DO;  [facility];  Service: Endoscopy;;   JOINT REPLACEMENT N/A    Phreesia 01/15/2020   MALONEY DILATION  10/28/2011   Procedure: Venia Minks DILATION;  Surgeon: Daneil Dolin, MD;  [facility]: [facility];  Service: Endoscopy;  Laterality: N/A;   MALONEY DILATION N/A 09/10/2017   Procedure: Venia Minks DILATION;  Surgeon: Daneil Dolin, MD;  [facility];  Service: Endoscopy;  Laterality: N/A;   SAVORY DILATION  10/28/2011   Procedure: SAVORY DILATION;  Surgeon: Daneil Dolin, MD;  [facility];  Service: Endoscopy;  Laterality: N/A;   TONSILLECTOMY     TOTAL ANKLE ARTHROPLASTY Left 11/25/2013   Procedure: TOTAL ANKLE ARTHOPLASTY;  Surgeon: Wylene Simmer, MD;  Location: [facility];  Service: Orthopedics;  Laterality: Left;  TOTAL ANKLE ARTHROPLASTY Right 07/14/2014   dr hewitt   TOTAL ANKLE ARTHROPLASTY Right 07/14/2014   Procedure: RIGHT TOTAL ANKLE ARTHOPLASTY;  Surgeon: Wylene Simmer, MD;  Location: [place];  Service: Orthopedics;  Laterality: Right;    Current Outpatient Medications  Medication Sig Dispense Refill   apixaban (ELIQUIS) 5 MG TABS tablet Take 1 tablet (5 mg total) by mouth 2 (two) times daily. 180 tablet 1   Chlorphen-PE-Acetaminophen (NOREL AD) 4-10-325 MG TABS Take 1 tablet by mouth 3 (three) times daily as needed. 30 tablet 0   diphenhydramine-acetaminophen (TYLENOL PM) 25-500 MG TABS tablet Take 1 tablet by mouth at bedtime as needed (sleep and mild discomfort).      dofetilide (TIKOSYN) 500 MCG capsule Take 1 capsule (500 mcg total) by mouth 2 (two) times daily. 180 capsule 2   ferrous sulfate 325 (65 FE) MG tablet Take 1 tablet (325 mg total) by mouth daily with breakfast.  3   finasteride (PROSCAR)  5 MG tablet Take 1 tablet by mouth daily.     fluticasone (FLONASE) 50 MCG/ACT nasal spray USE 1 SPRAY IN BOTH  NOSTRILS DAILY AS NEEDED  FOR ALLERGIES 32 g 2   gabapentin (NEURONTIN) 300 MG capsule Take 300 mg by mouth 2 (two) times daily.      levothyroxine (SYNTHROID) 75 MCG tablet Take 1 tablet (75 mcg total) by mouth daily. 90 tablet 3   loratadine (CLARITIN) 10 MG tablet Take 10 mg by mouth daily as needed for allergies.     losartan (COZAAR) 50 MG tablet Take 1 tablet (50 mg total) by mouth daily. 90 tablet 3   montelukast (SINGULAIR) 10 MG tablet Take 1 tablet (10 mg total) by mouth at bedtime. 30 tablet 3   Omega-3 Fatty Acids (RA FISH OIL) 1400 MG CPDR Take 1,400 mg by mouth 2 (two) times daily.     pantoprazole (PROTONIX) 40 MG tablet TAKE 1 TABLET BY MOUTH  DAILY BEFORE BREAKFAST 90 tablet 3   Polyethyl Glycol-Propyl Glycol 0.4-0.3 % SOLN Place 1 drop into both eyes 2 (two) times daily as needed (dry eyes).      Tamsulosin HCl (FLOMAX) 0.4 MG CAPS Take 0.4 mg by mouth daily after breakfast.      No current facility-administered medications for this visit.    Allergies:   Tetanus toxoids, Statins, Dicyclomine, Sulfamethoxazole, Doxycycline, Oxycodone, Penicillins, Sulfa antibiotics, and Xarelto [rivaroxaban]   Social History:  The patient  reports that he has never smoked. He has never used smokeless tobacco. He reports current alcohol use of about 2.0 standard drinks per week. He reports that he does not use drugs.   Family History:  The patient's family history includes ALS in his son; Arthritis in his mother; Cancer in his father; Heart disease in his mother; Hypertension in his father; Prostate cancer in his brother and father; Prostate cancer (age of onset: 72) in his son.  ROS:  Please see the history of present illness.    All other systems are reviewed and otherwise negative.   PHYSICAL EXAM:  VS:  BP 140/62 (BP Location: Left Arm, Patient Position: Sitting, Cuff Size:  Normal)    Pulse 79    Ht 5\' 10"  (1.778 m)    Wt 199 lb (90.3 kg)    SpO2 96%    BMI 28.55 kg/m  BMI: Body mass index is 28.55 kg/m. Well nourished, well developed, in no acute distress HEENT: normocephalic, atraumatic Neck: no JVD, carotid bruits or masses Cardiac:  RRR; no significant murmurs, no rubs, or gallops Lungs:  CTA b/l, no wheezing, rhonchi or rales Abd: soft, nontender MS: no deformity, age appropriate atrophy Ext: no edema Skin: warm and dry, no rash Neuro:  No gross deficits appreciated Psych: euthymic mood, full affect    EKG:  Done today and reviewed by myself shows  SR 79bpm, 1st degree AVBlock 228ms, RBBB (old), QTc 484ms not accounting for his QRS 133ms   07/07/2020: TTE IMPRESSIONS   1. Left ventricular ejection fraction, by estimation, is >75%. The left  ventricle has normal function. The left ventricle has no regional wall  motion abnormalities. There is mild left ventricular hypertrophy. Left  ventricular diastolic parameters are  consistent with Grade II diastolic dysfunction (pseudonormalization).  Elevated left atrial pressure.   2. Right ventricular systolic function is normal. The right ventricular  size is normal.   3. Left atrial size was severely dilated.   4. Right atrial size was mildly dilated.   5. The mitral valve is normal in structure. No evidence of mitral valve  regurgitation. No evidence of mitral stenosis.   6. The aortic valve is tricuspid. Aortic valve regurgitation is not  visualized. No aortic stenosis is present.   7. The inferior vena cava is normal in size with greater than 50%  respiratory variability, suggesting right atrial pressure of 3 mmHg.   10/06/2019: EPS/Ablation CONCLUSIONS: 1. Atrial fibrillation upon presentation.   2. Successful electrical isolation and anatomical encircling of all four pulmonary veins with radiofrequency current.  A WACA approach was used 3. Additional left atrial ablation was performed with a  standard box lesion created along the posterior wall of the left atrium 4. Ablation of the cavotricuspid isthmus due to typical atrial flutter 5. Atrial fibrillation successfully cardioverted to sinus rhythm. 6. No early apparent complications.   June 2020: Monitor SInus rhythm and atrial fibrillation   41 to 132 bpm  Average HR 69 bpm Pt in atrial fibrillation about 30% of time Longest pause 3 sec at 4:40 AM      Recent Labs: 10/17/2020: TSH 3.500 08/15/2021: Magnesium 2.2 08/16/2021: ALT 7 08/22/2021: BUN 15; Creatinine, Ser 1.26; Hemoglobin 9.6; Platelets 451; Potassium 4.5; Sodium 144  01/11/2021: Cholesterol, Total 170; HDL 44; LDL Chol Calc (NIH) 101; Triglycerides 139   Estimated Creatinine Clearance: 51.1 mL/min (by C-G formula based on SCr of 1.26 mg/dL).   Wt Readings from Last 3 Encounters:  09/10/21 199 lb (90.3 kg)  08/22/21 197 lb 1.9 oz (89.4 kg)  08/16/21 193 lb 8 oz (87.8 kg)     Other studies reviewed: Additional studies/records reviewed today include: summarized above  ASSESSMENT AND PLAN:  Persistent Afib CHA2DS2Vasc is 3, on Eliquis, appropriately dosed Tikosyn with stable QTc Low/no burden by symptoms/Kardia checks by the patient  Calc CrCl by last labds 57-58, with stable QTc, no changes Update labs   HTN Orthostatic/standing dizziness Remote hx of + tilt test Near syncope goes back many years Hx of syncope and bradycardia noted BB stopped a couple years ago  His BP a little elevated today, no changes at this time    Disposition: back in 41mo, sooner if needed  Current medicines are reviewed at length with the patient today.  The patient did not have any concerns regarding medicines.  Venetia Night, PA-C 09/10/2021 2:56 PM     [place] [address] [place] [address] [phone] (office)  [phone] (fax)

## 2021-09-10 ENCOUNTER — Ambulatory Visit: Payer: Medicare Other | Admitting: Physician Assistant

## 2021-09-10 ENCOUNTER — Other Ambulatory Visit: Payer: Self-pay

## 2021-09-10 ENCOUNTER — Encounter: Payer: Self-pay | Admitting: Physician Assistant

## 2021-09-10 VITALS — BP 140/62 | HR 79 | Ht 70.0 in | Wt 199.0 lb

## 2021-09-10 DIAGNOSIS — Z5181 Encounter for therapeutic drug level monitoring: Secondary | ICD-10-CM

## 2021-09-10 DIAGNOSIS — I4819 Other persistent atrial fibrillation: Secondary | ICD-10-CM

## 2021-09-10 DIAGNOSIS — I10 Essential (primary) hypertension: Secondary | ICD-10-CM | POA: Diagnosis not present

## 2021-09-10 DIAGNOSIS — Z79899 Other long term (current) drug therapy: Secondary | ICD-10-CM

## 2021-09-10 NOTE — Patient Instructions (Addendum)
Medication Instructions:   Your physician recommends that you continue on your current medications as directed. Please refer to the Current Medication list given to you today.   *If you need a refill on your cardiac medications before your next appointment, please call your pharmacy*   Lab Work: BMET CBC AND [place]   If you have labs (blood work) drawn today and your tests are completely normal, you will receive your results only by: [place] (if you have MyChart) OR A paper copy in the mail If you have any lab test that is abnormal or we need to change your treatment, we will call you to review the results.   Testing/Procedures: NONE ORDERED  TODAY     Follow-Up: At [facility], you and your health needs are our priority.  As part of our continuing mission to provide you with exceptional heart care, we have created designated Provider Care Teams.  These Care Teams include your primary Cardiologist (physician) and Advanced Practice Providers (APPs -  Physician Assistants and Nurse Practitioners) who all work together to provide you with the care you need, when you need it.  We recommend signing up for the patient portal called "MyChart".  Sign up information is provided on this After Visit Summary.  MyChart is used to connect with patients for Virtual Visits (Telemedicine).  Patients are able to view lab/test results, encounter notes, upcoming appointments, etc.  Non-urgent messages can be sent to your provider as well.   To learn more about what you can do with MyChart, go to [email or web address].    Your next appointment:  ( CONTACT [facility] FOR EP SCHEDULING ISSUES  IF UNAVAILABLE LEAVE MESSAGE FOR HER TO CONTACT PATIENT BACK )  4 month(s)  The format for your next appointment:   In Person  Provider:   Allegra Lai, MD    Other Instructions

## 2021-09-11 LAB — BASIC METABOLIC PANEL
BUN/Creatinine Ratio: 14 (ref 10–24)
BUN: 17 mg/dL (ref 8–27)
CO2: 22 mmol/L (ref 20–29)
Calcium: 9.4 mg/dL (ref 8.6–10.2)
Chloride: 105 mmol/L (ref 96–106)
Creatinine, Ser: 1.19 mg/dL (ref 0.76–1.27)
Glucose: 123 mg/dL — ABNORMAL HIGH (ref 70–99)
Potassium: 4.6 mmol/L (ref 3.5–5.2)
Sodium: 140 mmol/L (ref 134–144)
eGFR: 61 mL/min/{1.73_m2} (ref >59)

## 2021-09-11 LAB — CBC
Hematocrit: 34.5 % — ABNORMAL LOW (ref 37.5–51.0)
Hemoglobin: 10.5 g/dL — ABNORMAL LOW (ref 13.0–17.7)
MCH: 25.2 pg — ABNORMAL LOW (ref 26.6–33.0)
MCHC: 30.4 g/dL — ABNORMAL LOW (ref 31.5–35.7)
MCV: 83 fL (ref 79–97)
Platelets: 406 10*3/uL (ref 150–450)
RBC: 4.17 x10E6/uL (ref 4.14–5.80)
RDW: 19.4 % — ABNORMAL HIGH (ref 11.6–15.4)
WBC: 8.4 10*3/uL (ref 3.4–10.8)

## 2021-09-11 LAB — MAGNESIUM: Magnesium: 2.2 mg/dL (ref 1.6–2.3)

## 2021-09-14 NOTE — Addendum Note (Signed)
Addended by: Briant Cedar on: 09/14/2021 12:18 PM   Modules accepted: Orders

## 2021-09-18 DIAGNOSIS — L570 Actinic keratosis: Secondary | ICD-10-CM | POA: Diagnosis not present

## 2021-09-18 DIAGNOSIS — X32XXXD Exposure to sunlight, subsequent encounter: Secondary | ICD-10-CM | POA: Diagnosis not present

## 2021-09-28 ENCOUNTER — Encounter: Payer: Self-pay | Admitting: Gastroenterology

## 2021-09-28 ENCOUNTER — Ambulatory Visit: Payer: Medicare Other | Admitting: Gastroenterology

## 2021-09-28 ENCOUNTER — Other Ambulatory Visit: Payer: Self-pay

## 2021-09-28 VITALS — BP 176/99 | HR 70 | Temp 97.1°F | Ht 71.0 in | Wt 196.8 lb

## 2021-09-28 DIAGNOSIS — D500 Iron deficiency anemia secondary to blood loss (chronic): Secondary | ICD-10-CM | POA: Diagnosis not present

## 2021-09-28 DIAGNOSIS — K31819 Angiodysplasia of stomach and duodenum without bleeding: Secondary | ICD-10-CM | POA: Diagnosis not present

## 2021-09-28 NOTE — Patient Instructions (Signed)
Labs today. We will be in touch with results as available.  Continue iron daily.  Limit NSAIDs like ibuprofen, naprosyn as they can irritate the stomach and increase risk of bleeding.

## 2021-09-28 NOTE — Progress Notes (Signed)
Primary Care Physician: Lindell Spar, MD  Primary Gastroenterologist:  Garfield Cornea, MD   Chief Complaint  Patient presents with   hospital f/u    Overall feels much better. Has some fatigue now. Stool is dark but taking Iron. Missed few days of taking iron few weeks ago and stool was lighter.     HPI: Brent Wilkins is a 81 y.o. male here for hospital follow-up.  He was seen back in January when he presented to the hospital with profound microcytic anemia in the setting of Eliquis.  Outpatient labs by cardiology showed a hemoglobin of 7.6, had been 12.2 just 6 months before.  He was sent to the ED for evaluation of worsening dizziness, lightheadedness, symptomatic anemia. Stools were heme positive.  Chest x-ray also showed large hiatal hernia.  Known medium sized hiatal hernia with reflux esophagitis on EGD in 2019.  Labs consistent with IDA.  B12 over 3000.  Folate normal.  Patient was transfused during admission.  Patient states he got 1-1/2 units of blood but during the second unit he developed lip swelling and transfusion had to be stopped.  At time of discharge his hemoglobin was 8.2.  Labs from February 6, hemoglobin 10.5.  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  It was felt that he may need repeat EGD in 4 to 6 weeks for repeat treatment.    Remote colonoscopy in 2011 was normal except for scattered pancolonic diverticula.  Feeling much better.  More energy.  No longer having any dizziness or lightheadedness.  Blood pressure up a little today in the office but he states he has been monitoring at home and it has been good.  Plans to recheck later today.  Continues to take oral iron daily.  Stools have been dark.  He missed a couple doses last week and his stools did lighten up.  No rectal bleeding.  Acid reflux well controlled.  Second unit of blood stopped during  transfusion due to lip swelling.  Post reaction DAT IgG negative, DAT C3 negative.  Pathology interpretation, no transfusion reaction, okay for patient to receive additional units.  He has O+ blood, antibody screen was negative.  Patient states "I have too many antibodies in my blood".  He denied having blood transfusions prior to this admission.    No prior IV iron therapy.  He is back on Eliquis.  Current Outpatient Medications  Medication Sig Dispense Refill   apixaban (ELIQUIS) 5 MG TABS tablet Take 1 tablet (5 mg total) by mouth 2 (two) times daily. 180 tablet 1   Chlorphen-PE-Acetaminophen (NOREL AD) 4-10-325 MG TABS Take 1 tablet by mouth 3 (three) times daily as needed. 30 tablet 0   diphenhydramine-acetaminophen (TYLENOL PM) 25-500 MG TABS tablet Take 1 tablet by mouth at bedtime as needed (sleep and mild discomfort).      dofetilide (TIKOSYN) 500 MCG capsule Take 1 capsule (500 mcg total) by mouth 2 (two) times daily. 180 capsule 2   ferrous sulfate 325 (65 FE) MG tablet Take 1 tablet (325 mg total) by mouth daily with breakfast.  3   finasteride (PROSCAR) 5 MG tablet Take 1 tablet by mouth daily.     fluticasone (FLONASE) 50 MCG/ACT nasal spray USE 1 SPRAY IN BOTH  NOSTRILS DAILY AS NEEDED  FOR ALLERGIES 32 g 2   gabapentin (NEURONTIN) 300 MG capsule Take 300  mg by mouth 2 (two) times daily.      ibuprofen (ADVIL) 600 MG tablet Take 600 mg by mouth every 6 (six) hours as needed.     levothyroxine (SYNTHROID) 75 MCG tablet Take 1 tablet (75 mcg total) by mouth daily. 90 tablet 3   loratadine (CLARITIN) 10 MG tablet Take 10 mg by mouth daily as needed for allergies.     losartan (COZAAR) 50 MG tablet Take 1 tablet (50 mg total) by mouth daily. 90 tablet 3   montelukast (SINGULAIR) 10 MG tablet Take 1 tablet (10 mg total) by mouth at bedtime. 30 tablet 3   Omega-3 Fatty Acids (RA FISH OIL) 1400 MG CPDR Take 1,400 mg by mouth 2 (two) times daily.     pantoprazole (PROTONIX) 40 MG tablet  TAKE 1 TABLET BY MOUTH  DAILY BEFORE BREAKFAST 90 tablet 3   Polyethyl Glycol-Propyl Glycol 0.4-0.3 % SOLN Place 1 drop into both eyes 2 (two) times daily as needed (dry eyes).      Tamsulosin HCl (FLOMAX) 0.4 MG CAPS Take 0.4 mg by mouth daily after breakfast.      vitamin B-12 (CYANOCOBALAMIN) 1000 MCG tablet Take 1,000 mcg by mouth 3 (three) times a week.     No current facility-administered medications for this visit.    Allergies as of 09/28/2021 - Review Complete 09/28/2021  Allergen Reaction Noted   Tetanus toxoids Swelling 05/10/2013   Statins Other (See Comments) 10/12/2012   Dicyclomine Other (See Comments) 12/21/2018   Sulfamethoxazole  09/26/2011   Doxycycline Rash 09/30/2011   Oxycodone Other (See Comments) 07/06/2014   Penicillins Swelling and Rash 09/26/2011   Sulfa antibiotics Swelling and Rash 10/22/2011   Xarelto [rivaroxaban] Rash 06/16/2017    ROS:  General: Negative for anorexia, weight loss, fever, chills, fatigue, weakness. ENT: Negative for hoarseness, difficulty swallowing , nasal congestion. CV: Negative for chest pain, angina, palpitations, dyspnea on exertion, peripheral edema.  Respiratory: Negative for dyspnea at rest, dyspnea on exertion, cough, sputum, wheezing.  GI: See history of present illness. GU:  Negative for dysuria, hematuria, urinary incontinence, urinary frequency, nocturnal urination.  Endo: Negative for unusual weight change.    Physical Examination:   BP (!) 176/99    Pulse 70    Temp (!) 97.1 F (36.2 C) (Temporal)    Ht 5\' 11"  (1.803 m)    Wt 196 lb 12.8 oz (89.3 kg)    BMI 27.45 kg/m   General: Well-nourished, well-developed in no acute distress.  Eyes: No icterus. Mouth: masked Lungs: Clear to auscultation bilaterally.  Heart: Regular rate and rhythm, no murmurs rubs or gallops.  Abdomen: Bowel sounds are normal, nontender, nondistended, no hepatosplenomegaly or masses, no abdominal bruits or hernia , no rebound or guarding.    Extremities: No lower extremity edema. No clubbing or deformities. Neuro: Alert and oriented x 4   Skin: Warm and dry, no jaundice.   Psych: Alert and cooperative, normal mood and affect.  Labs:  Lab Results  Component Value Date   CREATININE 1.19 09/10/2021   BUN 17 09/10/2021   NA 140 09/10/2021   K 4.6 09/10/2021   CL 105 09/10/2021   CO2 22 09/10/2021   Lab Results  Component Value Date   ALT 7 08/16/2021   AST 17 08/16/2021   ALKPHOS 60 08/16/2021   BILITOT 0.5 08/16/2021   Lab Results  Component Value Date   WBC 8.4 09/10/2021   HGB 10.5 (L) 09/10/2021   HCT 34.5 (L) 09/10/2021  MCV 83 09/10/2021   PLT 406 09/10/2021   Lab Results  Component Value Date   IRON 12 (L) 08/16/2021   TIBC 405 08/16/2021   FERRITIN 9 (L) 08/16/2021   Lab Results  Component Value Date   VITAMINB12 3,057 (H) 08/16/2021   Lab Results  Component Value Date   FOLATE 21.2 08/16/2021    Imaging Studies: No results found.   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  EGD with well-documented GAVE with oozing, s/p APC.  Remote colonoscopy.  Denies any bowel concerns.  No fresh blood per rectum.  We will update labs today.  He will continue oral iron therapy, explained to patient that given GAVE and chronic anticoagulation he is at risk of chronic occult GI bleeding therefore would benefit from chronic iron therapy.  He is also aware that he will need regular hemoglobin checks if he is unable to maintain his hemoglobin and iron, he may need additional APC for GAVE.  Cannot rule out possibility of updating colonoscopy if that occurs as well.   Plan: CBC.  Continue oral iron daily. If unable to maintain hemoglobin and iron, he may need additional EGD with APC treatment and/or colonoscopy. Continue pantoprazole daily. Limit NSAID use.

## 2021-09-29 LAB — CBC WITH DIFFERENTIAL/PLATELET
Basophils Absolute: 0 10*3/uL (ref 0.0–0.2)
Basos: 1 %
EOS (ABSOLUTE): 0.2 10*3/uL (ref 0.0–0.4)
Eos: 3 %
Hematocrit: 38.2 % (ref 37.5–51.0)
Hemoglobin: 11.9 g/dL — ABNORMAL LOW (ref 13.0–17.7)
Immature Grans (Abs): 0 10*3/uL (ref 0.0–0.1)
Immature Granulocytes: 0 %
Lymphocytes Absolute: 1.9 10*3/uL (ref 0.7–3.1)
Lymphs: 27 %
MCH: 26.2 pg — ABNORMAL LOW (ref 26.6–33.0)
MCHC: 31.2 g/dL — ABNORMAL LOW (ref 31.5–35.7)
MCV: 84 fL (ref 79–97)
Monocytes Absolute: 0.6 10*3/uL (ref 0.1–0.9)
Monocytes: 8 %
Neutrophils Absolute: 4.3 10*3/uL (ref 1.4–7.0)
Neutrophils: 61 %
Platelets: 293 10*3/uL (ref 150–450)
RBC: 4.55 x10E6/uL (ref 4.14–5.80)
RDW: 19.4 % — ABNORMAL HIGH (ref 11.6–15.4)
WBC: 7 10*3/uL (ref 3.4–10.8)

## 2021-10-04 ENCOUNTER — Other Ambulatory Visit: Payer: Self-pay

## 2021-10-04 DIAGNOSIS — D500 Iron deficiency anemia secondary to blood loss (chronic): Secondary | ICD-10-CM

## 2021-10-08 ENCOUNTER — Ambulatory Visit
Admission: EM | Admit: 2021-10-08 | Discharge: 2021-10-08 | Disposition: A | Discharge status: Home / Self Care | Payer: Medicare Other | Attending: Family Medicine | Admitting: Family Medicine

## 2021-10-08 ENCOUNTER — Other Ambulatory Visit: Payer: Self-pay

## 2021-10-08 DIAGNOSIS — R051 Acute cough: Secondary | ICD-10-CM | POA: Diagnosis not present

## 2021-10-08 MED ORDER — DEXAMETHASONE SODIUM PHOSPHATE 10 MG/ML IJ SOLN
10.0000 mg | Freq: Once | INTRAMUSCULAR | Status: AC
  Administered 2021-10-08: 10 mg via INTRAMUSCULAR

## 2021-10-08 NOTE — ED Provider Notes (Signed)
[facility]    CSN: [id number] Arrival date & time: 10/08/21  1032      History   Chief Complaint Chief Complaint  Patient presents with   Cough    HPI Brent Wilkins is a 81 y.o. male.   Presenting today with about a week of congestion, sore throat, sinus pressure, cough, body aches that has improved significantly but still having a hacking cough.  States his wife now tested positive this morning for COVID with similar symptoms to his.  He never took a test for COVID while he was sick.  He has taken some sinus medication and Tylenol with mild relief.  History of seasonal allergies compliant with regimen, no past history of known pulmonary disease.   Past Medical History:  Diagnosis Date   Acute blood loss anemia 08/16/2021   Allergy    hay fever;  seasonal   Arthritis    right ankle   Cataract    surgery 2016   Chronic kidney disease    stones many years ago   Constipation 12/09/2018   Dizziness 12/13/2015   Dysphagia    Family history of malignant neoplasm of prostate 12/12/2017   Frequent urination at night    GERD (gastroesophageal reflux disease)    History of arthroplasty of right ankle 02/04/2020   History of kidney stones    History of prostate cancer 06/16/2017   HTN (hypertension)    Hyperlipidemia LDL goal <100 10/26/2019   Hypertension    Phreesia 04/24/2020   Hypothyroidism    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 on Tikosyn and Eliquis b. s/p ablation in 10/2019 due to break-through episodes while on Tikosyn.    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 Rosana Hoes   Schatzki's ring 10/22/2011   Seasonal allergies    Syncope  dx. 8 yrs ago   Syncope, cardiogenic 03/20/2017   Vaccine counseling 09/15/2019    Patient Active Problem List   Diagnosis Date Noted   Hospital discharge follow-up 08/22/2021   Iron  deficiency anemia due to chronic blood loss 08/22/2021   Chronic frontal sinusitis 08/22/2021   GAVE (gastric antral vascular ectasia)    Acute upper GI bleed    Melena 08/16/2021   Stage 3a chronic kidney disease (CKD) ([place]) 08/16/2021   Symptomatic anemia    Fatigue 08/03/2020   Annual visit for general adult medical examination with abnormal findings 04/27/2020   Need for immunization against influenza 04/27/2020   Malignant neoplasm of prostate ([place]) 12/20/2019   Hyperlipidemia LDL goal <100 10/26/2019   Environmental and seasonal allergies 09/15/2019   Benign essential tremor 06/16/2017   Polyneuropathy 06/16/2017   Hypothyroidism 06/16/2017   Atrial fibrillation ([place]) 06/05/2015   HTN, goal below 140/90 06/05/2015   Dysphagia 10/22/2011   GERD (gastroesophageal reflux disease) 10/22/2011    Past Surgical History:  Procedure Laterality Date   APPENDECTOMY     ATRIAL FIBRILLATION ABLATION N/A 10/06/2019   Procedure: ATRIAL FIBRILLATION ABLATION;  Surgeon: Constance Haw, MD;  Location: [place] [facility];  Service: Cardiovascular;  Laterality: N/A;   CATARACT EXTRACTION W/PHACO Left 03/27/2015   Procedure: CATARACT EXTRACTION PHACO AND INTRAOCULAR LENS PLACEMENT LEFT EYE CDE=11.73;  Surgeon: Tonny Branch, MD;  Location: [facility];  Service: Ophthalmology;  Laterality: Left;   CATARACT EXTRACTION W/PHACO Right  03/30/2015   Procedure: CATARACT EXTRACTION PHACO AND INTRAOCULAR LENS PLACEMENT RIGHT EYE CDE=7.35;  Surgeon: Tonny Branch, MD;  Location: [facility];  Service: Ophthalmology;  Laterality: Right;   CHOLECYSTECTOMY     COLONOSCOPY  07/23/2010   Dr. Vivi Ferns rectum, scattered pancolonic diverticula   COLONOSCOPY  08/10/1999   internal hemorrhoids,inflammatory polyp   ESOPHAGOGASTRODUODENOSCOPY  04/12/2008   Prominent Schatzki's ring, erosive reflux esophagitis, multiple antral erosions, small hiatal hernia, reactive gastropathy, status post dilation with 35F    ESOPHAGOGASTRODUODENOSCOPY  10/28/2011   Procedure: ESOPHAGOGASTRODUODENOSCOPY (EGD);  Surgeon: Daneil Dolin, MD;  [facility];  Service: Endoscopy;  Laterality: N/A;  1:30   ESOPHAGOGASTRODUODENOSCOPY N/A 09/10/2017   Dr. Gala Romney: Schatkzi's ring s/p dilation at GE junction, mild esophageal reflux esophagitis, medium sized hiatal hernia, normal duodenum. 95 and 26 Fr dilation   ESOPHAGOGASTRODUODENOSCOPY (EGD) WITH PROPOFOL N/A 08/17/2021   Moderate sized hiatal hernia, nodular GAVE 1 spot actively oozing status post APC.  Subsequent APC of remainder GAVE.   EYE SURGERY     HOT HEMOSTASIS  08/17/2021   Procedure: HOT HEMOSTASIS (ARGON PLASMA COAGULATION/BICAP);  Surgeon: Eloise Harman, DO;  [facility];  Service: Endoscopy;;   JOINT REPLACEMENT N/A    Phreesia 01/15/2020   MALONEY DILATION  10/28/2011   Procedure: Venia Minks DILATION;  Surgeon: Daneil Dolin, MD;  [facility];  Service: Endoscopy;  Laterality: N/A;   MALONEY DILATION N/A 09/10/2017   Procedure: Venia Minks DILATION;  Surgeon: Daneil Dolin, MD;  [facility];  Service: Endoscopy;  Laterality: N/A;   SAVORY DILATION  10/28/2011   Procedure: SAVORY DILATION;  Surgeon: Daneil Dolin, MD;  [facility];  Service: Endoscopy;  Laterality: N/A;   TONSILLECTOMY     TOTAL ANKLE ARTHROPLASTY Left 11/25/2013   Procedure: TOTAL ANKLE ARTHOPLASTY;  Surgeon: Wylene Simmer, MD;  Location: [place];  Service: Orthopedics;  Laterality: Left;   TOTAL ANKLE ARTHROPLASTY Right 07/14/2014   dr hewitt   TOTAL ANKLE ARTHROPLASTY Right 07/14/2014   Procedure: RIGHT TOTAL ANKLE ARTHOPLASTY;  Surgeon: Wylene Simmer, MD;  Location: [place];  Service: Orthopedics;  Laterality: Right;     Home Medications    Prior to Admission medications   Medication Sig Start Date End Date Taking? Authorizing Provider  apixaban (ELIQUIS) 5 MG TABS tablet Take 1 tablet (5 mg total) by mouth 2 (two) times daily.  08/24/21   Camnitz, Will Hassell Done, MD  Chlorphen-PE-Acetaminophen (NOREL AD) 4-10-325 MG TABS Take 1 tablet by mouth 3 (three) times daily as needed. 08/22/21   Lindell Spar, MD  diphenhydramine-acetaminophen (TYLENOL PM) 25-500 MG TABS tablet Take 1 tablet by mouth at bedtime as needed (sleep and mild discomfort).     [provider]  dofetilide (TIKOSYN) 500 MCG capsule Take 1 capsule (500 mcg total) by mouth 2 (two) times daily. 06/05/21   Camnitz, Ocie Doyne, MD  ferrous sulfate 325 (65 FE) MG tablet Take 1 tablet (325 mg total) by mouth daily with breakfast. 08/19/21   Tat, Shanon Brow, MD  finasteride (PROSCAR) 5 MG tablet Take 1 tablet by mouth daily. 05/25/20   [provider]  fluticasone (FLONASE) 50 MCG/ACT nasal spray USE 1 SPRAY IN BOTH  NOSTRILS DAILY AS NEEDED  FOR ALLERGIES 10/30/20   Noreene Larsson, NP  gabapentin (NEURONTIN) 300 MG capsule Take 300 mg by mouth 2 (two) times daily.  04/19/15   [provider]  ibuprofen (ADVIL) 600 MG  tablet Take 600 mg by mouth every 6 (six) hours as needed.    [provider]  levothyroxine (SYNTHROID) 75 MCG tablet Take 1 tablet (75 mcg total) by mouth daily. 08/23/21   Lindell Spar, MD  loratadine (CLARITIN) 10 MG tablet Take 10 mg by mouth daily as needed for allergies.    [provider]  losartan (COZAAR) 50 MG tablet Take 1 tablet (50 mg total) by mouth daily. 06/05/21 09/28/21  Camnitz, Ocie Doyne, MD  montelukast (SINGULAIR) 10 MG tablet Take 1 tablet (10 mg total) by mouth at bedtime. 08/03/20   Noreene Larsson, NP  Omega-3 Fatty Acids (RA FISH OIL) 1400 MG CPDR Take 1,400 mg by mouth 2 (two) times daily.    [provider]  pantoprazole (PROTONIX) 40 MG tablet TAKE 1 TABLET BY MOUTH  DAILY BEFORE BREAKFAST 08/25/21   Mahala Menghini, PA-C  Polyethyl Glycol-Propyl Glycol 0.4-0.3 % SOLN Place 1 drop into both eyes 2 (two) times daily as needed (dry eyes).     [provider]  Tamsulosin  HCl (FLOMAX) 0.4 MG CAPS Take 0.4 mg by mouth daily after breakfast.     [provider]  vitamin B-12 (CYANOCOBALAMIN) 1000 MCG tablet Take 1,000 mcg by mouth 3 (three) times a week.    [provider]    Family History Family History  Problem Relation Age of Onset   Prostate cancer Father    Cancer Father        prostate to bone   Hypertension Father    Prostate cancer Son 60   ALS Son    Prostate cancer Brother    Heart disease Mother        died at 26   Arthritis Mother    Colon cancer Neg Hx     Social History Social History   Tobacco Use   Smoking status: Never   Smokeless tobacco: Never  Vaping Use   Vaping Use: Never used  Substance Use Topics   Alcohol use: Yes    Alcohol/week: 2.0 standard drinks    Types: 1 Glasses of wine, 1 Cans of beer per week    Comment: glass of wine or "couple of beers" occasionally    Drug use: No     Allergies   Tetanus toxoids, Statins, Dicyclomine, Sulfamethoxazole, Doxycycline, Oxycodone, Penicillins, Sulfa antibiotics, and Xarelto [rivaroxaban]   Review of Systems Review of Systems Per HPI  Physical Exam Triage Vital Signs ED Triage Vitals  Enc Vitals Group     BP 10/08/21 1102 138/86     Pulse Rate 10/08/21 1102 75     Resp 10/08/21 1102 18     Temp 10/08/21 1102 98.2 F (36.8 C)     Temp Source 10/08/21 1102 Oral     SpO2 10/08/21 1102 95 %     Weight --      Height --      Head Circumference --      Peak Flow --      Pain Score 10/08/21 1100 0     Pain Loc --      Pain Edu? --      Excl. in [place]? --    No data found.  Updated Vital Signs BP 138/86 (BP Location: Right Arm)    Pulse 75    Temp 98.2 F (36.8 C) (Oral)    Resp 18    SpO2 95%   Visual Acuity Right Eye Distance:   Left Eye Distance:  Bilateral Distance:    Right Eye Near:   Left Eye Near:    Bilateral Near:     Physical Exam Vitals and nursing note reviewed.  Constitutional:      Appearance: Normal appearance. He  is well-developed.  HENT:     Head: Atraumatic.     Right Ear: External ear normal.     Left Ear: External ear normal.     Nose: Rhinorrhea present.     Mouth/Throat:     Pharynx: Posterior oropharyngeal erythema present. No oropharyngeal exudate.  Eyes:     Extraocular Movements: Extraocular movements intact.     Conjunctiva/sclera: Conjunctivae normal.     Pupils: Pupils are equal, round, and reactive to light.  Cardiovascular:     Rate and Rhythm: Normal rate and regular rhythm.  Pulmonary:     Effort: Pulmonary effort is normal. No respiratory distress.     Breath sounds: Normal breath sounds. No wheezing or rales.  Musculoskeletal:        General: Normal range of motion.     Cervical back: Normal range of motion and neck supple.  Lymphadenopathy:     Cervical: No cervical adenopathy.  Skin:    General: Skin is warm and dry.  Neurological:     General: No focal deficit present.     Mental Status: He is alert and oriented to person, place, and time.  Psychiatric:        Mood and Affect: Mood normal.        Behavior: Behavior normal.        Thought Content: Thought content normal.        Judgment: Judgment normal.   UC Treatments / Results  Labs (all labs ordered are listed, but only abnormal results are displayed) Labs Reviewed - No data to display  EKG   Radiology No results found.  Procedures Procedures (including critical care time)  Medications Ordered in UC Medications  dexamethasone (DECADRON) injection 10 mg (has no administration in time range)   Initial Impression / Assessment and Plan / UC Course  I have reviewed the triage vital signs and the nursing notes.  Pertinent labs & imaging results that were available during my care of the patient were reviewed by me and considered in my medical decision making (see chart for details).     Vital signs and exam overall reassuring, suspect resolving post viral symptoms.  Given the extent of his cough,  will treat with IM Decadron in addition to continuing supportive over-the-counter medications and home care.  Return for acutely worsening symptoms  Final Clinical Impressions(s) / UC Diagnoses   Final diagnoses:  Acute cough   Discharge Instructions   None    ED Prescriptions   None    PDMP not reviewed this encounter.   Volney American, [place] 10/08/21 1142

## 2021-10-08 NOTE — ED Triage Notes (Signed)
Pt states that a week ago he thought he had a sinus infection with body aches, cough with congestion  ? ?Pt states he took some sinus meds and tylenol ?

## 2021-11-06 ENCOUNTER — Other Ambulatory Visit: Payer: Self-pay | Admitting: *Deleted

## 2021-11-06 MED ORDER — FLUTICASONE PROPIONATE 50 MCG/ACT NA SUSP: NASAL | 2 refills | Status: DC

## 2021-11-12 DIAGNOSIS — D500 Iron deficiency anemia secondary to blood loss (chronic): Secondary | ICD-10-CM | POA: Diagnosis not present

## 2021-11-12 LAB — CBC WITH DIFFERENTIAL/PLATELET
Absolute Monocytes: 410 cells/uL (ref 200–950)
Basophils Absolute: 38 cells/uL (ref 0–200)
Basophils Relative: 0.6 %
Eosinophils Absolute: 290 cells/uL (ref 15–500)
Eosinophils Relative: 4.6 %
HCT: 44.1 % (ref 38.5–50.0)
Hemoglobin: 14.2 g/dL (ref 13.2–17.1)
Lymphs Abs: 2079 cells/uL (ref 850–3900)
MCH: 28.3 pg (ref 27.0–33.0)
MCHC: 32.2 g/dL (ref 32.0–36.0)
MCV: 88 fL (ref 80.0–100.0)
MPV: 9.3 fL (ref 7.5–12.5)
Monocytes Relative: 6.5 %
Neutro Abs: 3484 cells/uL (ref 1500–7800)
Neutrophils Relative %: 55.3 %
Platelets: 254 10*3/uL (ref 140–400)
RBC: 5.01 10*6/uL (ref 4.20–5.80)
RDW: 16.5 % — ABNORMAL HIGH (ref 11.0–15.0)
Total Lymphocyte: 33 %
WBC: 6.3 10*3/uL (ref 3.8–10.8)

## 2021-11-21 ENCOUNTER — Other Ambulatory Visit: Payer: Self-pay

## 2021-11-21 DIAGNOSIS — D500 Iron deficiency anemia secondary to blood loss (chronic): Secondary | ICD-10-CM

## 2021-11-26 DIAGNOSIS — M25811 Other specified joint disorders, right shoulder: Secondary | ICD-10-CM | POA: Diagnosis not present

## 2021-11-26 DIAGNOSIS — M67412 Ganglion, left shoulder: Secondary | ICD-10-CM | POA: Diagnosis not present

## 2021-11-28 ENCOUNTER — Ambulatory Visit (INDEPENDENT_AMBULATORY_CARE_PROVIDER_SITE_OTHER): Payer: Medicare Other | Admitting: Internal Medicine

## 2021-11-28 ENCOUNTER — Encounter: Payer: Self-pay | Admitting: Internal Medicine

## 2021-11-28 VITALS — BP 140/88 | HR 70 | Resp 18 | Ht 70.0 in | Wt 198.2 lb

## 2021-11-28 DIAGNOSIS — Z0001 Encounter for general adult medical examination with abnormal findings: Secondary | ICD-10-CM

## 2021-11-28 DIAGNOSIS — E559 Vitamin D deficiency, unspecified: Secondary | ICD-10-CM

## 2021-11-28 DIAGNOSIS — K219 Gastro-esophageal reflux disease without esophagitis: Secondary | ICD-10-CM

## 2021-11-28 DIAGNOSIS — E039 Hypothyroidism, unspecified: Secondary | ICD-10-CM | POA: Diagnosis not present

## 2021-11-28 DIAGNOSIS — I10 Essential (primary) hypertension: Secondary | ICD-10-CM

## 2021-11-28 DIAGNOSIS — I4811 Longstanding persistent atrial fibrillation: Secondary | ICD-10-CM | POA: Diagnosis not present

## 2021-11-28 DIAGNOSIS — D500 Iron deficiency anemia secondary to blood loss (chronic): Secondary | ICD-10-CM | POA: Diagnosis not present

## 2021-11-28 NOTE — Assessment & Plan Note (Signed)
Due to UGIB - now improved ?EGD -1 oozing lesion treated with APC and showed GAVE ?Continue iron supplement ?Check CBC ?F/u with GI ?

## 2021-11-28 NOTE — Assessment & Plan Note (Signed)
Lab Results  ?Component Value Date  ? TSH 3.500 10/17/2020  ? ?On levothyroxine ?Check TSH and free T4 ?

## 2021-11-28 NOTE — Patient Instructions (Signed)
Please continue taking medications as prescribed.  Please continue to follow low salt diet and ambulate as tolerated.  Please get fasting blood tests done before the next visit. 

## 2021-11-28 NOTE — Assessment & Plan Note (Signed)
On dofetilide and Eliquis Currently in sinus rhythm Followed by Cardiology 

## 2021-11-28 NOTE — Assessment & Plan Note (Signed)
BP Readings from Last 1 Encounters:  ?11/28/21 140/88  ? ?Well-controlled for his age ?Counseled for compliance with the medications ?Advised DASH diet and moderate exercise/walking as tolerated ?

## 2021-11-28 NOTE — Assessment & Plan Note (Signed)
Continue pantoprazole. °

## 2021-11-28 NOTE — Progress Notes (Signed)
? ?Established Patient Office Visit ? ?Subjective:  ?Patient ID: Brent Wilkins, male    DOB: Mar 28, 1941  Age: 81 y.o. MRN: [id number] ? ?CC:  ?Chief Complaint  ?Patient presents with  ? Annual Exam  ?  Annual exam   ? ? ?HPI ?Brent Wilkins is a 81 y.o. male with past medical history of A. fib, HTN, hypothyroidism, and IDA who presents for annual physical. ? ?HTN and A Fib: BP is well-controlled for his age. Takes medications regularly. Patient denies headache, dizziness, chest pain, dyspnea or palpitations. He has started taking Eliquis now, denies any active signs of bleeding. ? ?IDA: Hb improved to 14.2 now, continues to take oral iron supplements. Denies fatigue, able to walk without dyspnea now. ? ? ? ? ? ?Past Medical History:  ?Diagnosis Date  ? Acute blood loss anemia 08/16/2021  ? Allergy   ? hay fever;  seasonal  ? Arthritis   ? right ankle  ? Cataract   ? surgery 2016  ? Chronic kidney disease   ? stones many years ago  ? Constipation 12/09/2018  ? Dizziness 12/13/2015  ? Dysphagia   ? Family history of malignant neoplasm of prostate 12/12/2017  ? Frequent urination at night   ? GERD (gastroesophageal reflux disease)   ? History of arthroplasty of right ankle 02/04/2020  ? History of kidney stones   ? History of prostate cancer 06/16/2017  ? HTN (hypertension)   ? Hyperlipidemia LDL goal <100 10/26/2019  ? Hypertension   ? Phreesia 04/24/2020  ? Hypothyroidism   ? Meningitis spinal   ? as a child  ? Neuropathy   ? Bilateral ankles  ? Paroxysmal atrial fibrillation ([facility])   ? a. on Tikosyn and Eliquis b. s/p ablation in 10/2019 due to break-through episodes while on Tikosyn.   ? Polyneuropathy 06/16/2017  ? 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 Prostate cancer ([place]) 2010  ? Dr. Rosana Hoes  ? Schatzki's ring 10/22/2011  ? Seasonal allergies   ? Syncope  dx. 8 yrs ago  ? Syncope, cardiogenic 03/20/2017  ? Vaccine counseling 09/15/2019  ? ? ?Past Surgical History:  ?Procedure Laterality Date  ? APPENDECTOMY    ?  ATRIAL FIBRILLATION ABLATION N/A 10/06/2019  ? Procedure: ATRIAL FIBRILLATION ABLATION;  Surgeon: Constance Haw, MD;  Location: [place] [facility];  Service: Cardiovascular;  Laterality: N/A;  ? CATARACT EXTRACTION W/PHACO Left 03/27/2015  ? Procedure: CATARACT EXTRACTION PHACO AND INTRAOCULAR LENS PLACEMENT LEFT EYE CDE=11.73;  Surgeon: Tonny Branch, MD;  Location: [facility];  Service: Ophthalmology;  Laterality: Left;  ? CATARACT EXTRACTION W/PHACO Right 03/30/2015  ? Procedure: CATARACT EXTRACTION PHACO AND INTRAOCULAR LENS PLACEMENT RIGHT EYE CDE=7.35;  Surgeon: Tonny Branch, MD;  Location: [facility];  Service: Ophthalmology;  Laterality: Right;  ? CHOLECYSTECTOMY    ? COLONOSCOPY  07/23/2010  ? Dr. Vivi Ferns rectum, scattered pancolonic diverticula  ? COLONOSCOPY  08/10/1999  ? internal hemorrhoids,inflammatory polyp  ? ESOPHAGOGASTRODUODENOSCOPY  04/12/2008  ? Prominent Schatzki's ring, erosive reflux esophagitis, multiple antral erosions, small hiatal hernia, reactive gastropathy, status post dilation with 25F  ? ESOPHAGOGASTRODUODENOSCOPY  10/28/2011  ? Procedure: ESOPHAGOGASTRODUODENOSCOPY (EGD);  Surgeon: Daneil Dolin, MD;  [facility];  Service: Endoscopy;  Laterality: N/A;  1:30  ? ESOPHAGOGASTRODUODENOSCOPY N/A 09/10/2017  ? Dr. Gala Romney: Schatkzi's ring s/p dilation at GE junction, mild esophageal reflux esophagitis, medium sized hiatal hernia, normal duodenum. 56 and 63 Fr dilation  ? ESOPHAGOGASTRODUODENOSCOPY (EGD) WITH PROPOFOL N/A 08/17/2021  ?  Moderate sized hiatal hernia, nodular GAVE 1 spot actively oozing status post APC.  Subsequent APC of remainder GAVE.  ? EYE SURGERY    ? HOT HEMOSTASIS  08/17/2021  ? Procedure: HOT HEMOSTASIS (ARGON PLASMA COAGULATION/BICAP);  Surgeon: Eloise Harman, DO;  [facility];  Service: Endoscopy;;  ? JOINT REPLACEMENT N/A   ? Phreesia 01/15/2020  ? MALONEY DILATION  10/28/2011  ? Procedure: MALONEY DILATION;  Surgeon: Daneil Dolin,  MD;  [facility];  Service: Endoscopy;  Laterality: N/A;  ? MALONEY DILATION N/A 09/10/2017  ? Procedure: MALONEY DILATION;  Surgeon: Daneil Dolin, MD;  [facility];  Service: Endoscopy;  Laterality: N/A;  ? SAVORY DILATION  10/28/2011  ? Procedure: SAVORY DILATION;  Surgeon: Daneil Dolin, MD;  [facility];  Service: Endoscopy;  Laterality: N/A;  ? TONSILLECTOMY    ? TOTAL ANKLE ARTHROPLASTY Left 11/25/2013  ? Procedure: TOTAL ANKLE ARTHOPLASTY;  Surgeon: Wylene Simmer, MD;  Location: [place];  Service: Orthopedics;  Laterality: Left;  ? TOTAL ANKLE ARTHROPLASTY Right 07/14/2014  ? dr hewitt  ? TOTAL ANKLE ARTHROPLASTY Right 07/14/2014  ? Procedure: RIGHT TOTAL ANKLE ARTHOPLASTY;  Surgeon: Wylene Simmer, MD;  Location: [place];  Service: Orthopedics;  Laterality: Right;  ? ? ?Family History  ?Problem Relation Age of Onset  ? Prostate cancer Father   ? Cancer Father   ?     prostate to bone  ? Hypertension Father   ? Prostate cancer Son 66  ? ALS Son   ? Prostate cancer Brother   ? Heart disease Mother   ?     died at 8  ? Arthritis Mother   ? Colon cancer Neg Hx   ? ? ?Social History  ? ?Socioeconomic History  ? Marital status: Married  ?  Spouse name: Candace  ? Number of children: 2  ? Years of education: Not on file  ? Highest education level: Bachelor's degree (e.g., BA, AB, BS)  ?Occupational History  ? Occupation: retired; Nurse, learning disability  ?  Employer: RETIRED  ?Tobacco Use  ? Smoking status: Never  ? Smokeless tobacco: Never  ?Vaping Use  ? Vaping Use: Never used  ?Substance and Sexual Activity  ? Alcohol use: Yes  ?  Alcohol/week: 2.0 standard drinks  ?  Types: 1 Glasses of wine, 1 Cans of beer per week  ?  Comment: glass of wine or "couple of beers" occasionally   ? Drug use: No  ? Sexual activity: Yes  ?  Partners: Female  ?Other Topics Concern  ? Not on file  ?Social History Narrative  ? Lives with wife Candace.   ?   ? Moved from [place], [place]  ? Retired from  [employer].  ? Plays golf.  ? Works with [employer] and [employer] for [employer].  ? Wears seatbelt.  ? Drives.  ? Eats all food groups.  ? Never smoked.  ? Married for over 27 years June  (2020)  ? Has two grown children, live in [place] and [place].  ? ?Social Determinants of Health  ? ?Financial Resource Strain: Low Risk   ? Difficulty of Paying Living Expenses: Not hard at all  ?Food Insecurity: No Food Insecurity  ? Worried About Charity fundraiser in the Last Year: Never true  ? Ran Out of Food in the Last Year: Never true  ?Transportation Needs: No Transportation Needs  ? Lack of Transportation (Medical): No  ?  Lack of Transportation (Non-Medical): No  ?Physical Activity: Not on file  ?Stress: Not on file  ?Social Connections: Socially Integrated  ? Frequency of Communication with Friends and Family: More than three times a week  ? Frequency of Social Gatherings with Friends and Family: More than three times a week  ? Attends Religious Services: More than 4 times per year  ? Active Member of Clubs or Organizations: Yes  ? Attends Archivist Meetings: More than 4 times per year  ? Marital Status: Married  ?Intimate Partner Violence: Not on file  ? ? ?Outpatient Medications Prior to Visit  ?Medication Sig Dispense Refill  ? apixaban (ELIQUIS) 5 MG TABS tablet Take 1 tablet (5 mg total) by mouth 2 (two) times daily. 180 tablet 1  ? diphenhydramine-acetaminophen (TYLENOL PM) 25-500 MG TABS tablet Take 1 tablet by mouth at bedtime as needed (sleep and mild discomfort).     ? dofetilide (TIKOSYN) 500 MCG capsule Take 1 capsule (500 mcg total) by mouth 2 (two) times daily. 180 capsule 2  ? ferrous sulfate 325 (65 FE) MG tablet Take 1 tablet (325 mg total) by mouth daily with breakfast.  3  ? finasteride (PROSCAR) 5 MG tablet Take 1 tablet by mouth daily.    ? fluticasone (FLONASE) 50 MCG/ACT nasal spray USE 1 SPRAY IN BOTH  NOSTRILS DAILY AS NEEDED  FOR ALLERGIES 32 g 2  ? gabapentin (NEURONTIN) 100 MG capsule  Take 200 mg by mouth 2 (two) times daily.    ? levothyroxine (SYNTHROID) 75 MCG tablet Take 1 tablet (75 mcg total) by mouth daily. 90 tablet 3  ? loratadine (CLARITIN) 10 MG tablet Take 10 mg by mouth daily

## 2021-11-28 NOTE — Assessment & Plan Note (Signed)
Physical exam as documented. Fasting blood tests ordered. 

## 2021-12-24 DIAGNOSIS — M25571 Pain in right ankle and joints of right foot: Secondary | ICD-10-CM | POA: Diagnosis not present

## 2021-12-25 DIAGNOSIS — E559 Vitamin D deficiency, unspecified: Secondary | ICD-10-CM | POA: Diagnosis not present

## 2021-12-25 DIAGNOSIS — R5383 Other fatigue: Secondary | ICD-10-CM | POA: Diagnosis not present

## 2022-01-14 DIAGNOSIS — M19071 Primary osteoarthritis, right ankle and foot: Secondary | ICD-10-CM | POA: Diagnosis not present

## 2022-01-14 DIAGNOSIS — M79671 Pain in right foot: Secondary | ICD-10-CM | POA: Diagnosis not present

## 2022-01-14 DIAGNOSIS — M25571 Pain in right ankle and joints of right foot: Secondary | ICD-10-CM | POA: Diagnosis not present

## 2022-01-17 ENCOUNTER — Telehealth: Payer: Self-pay

## 2022-01-17 NOTE — Telephone Encounter (Signed)
Pt wants to have psa added to labs that he is coming in July to have drawn states that his neurologist wants him to have this done to see where he stands. Please advise.

## 2022-01-19 ENCOUNTER — Other Ambulatory Visit: Payer: Self-pay | Admitting: Cardiology

## 2022-01-19 DIAGNOSIS — I4811 Longstanding persistent atrial fibrillation: Secondary | ICD-10-CM

## 2022-01-21 DIAGNOSIS — Z96661 Presence of right artificial ankle joint: Secondary | ICD-10-CM | POA: Diagnosis not present

## 2022-01-21 DIAGNOSIS — M19071 Primary osteoarthritis, right ankle and foot: Secondary | ICD-10-CM | POA: Diagnosis not present

## 2022-01-21 NOTE — Telephone Encounter (Signed)
Prescription refill request for Eliquis received. Indication:Afib Last office visit:2/23 Scr:1.1 Age: 81 Weight:89.9 kg  Prescription refilled

## 2022-01-22 ENCOUNTER — Other Ambulatory Visit: Payer: Self-pay | Admitting: Internal Medicine

## 2022-01-22 ENCOUNTER — Other Ambulatory Visit: Payer: Self-pay

## 2022-01-22 ENCOUNTER — Ambulatory Visit: Payer: Medicare Other | Admitting: Cardiology

## 2022-01-22 ENCOUNTER — Encounter: Payer: Self-pay | Admitting: Cardiology

## 2022-01-22 VITALS — BP 154/90 | HR 66 | Ht 70.0 in | Wt 199.0 lb

## 2022-01-22 DIAGNOSIS — Z79899 Other long term (current) drug therapy: Secondary | ICD-10-CM

## 2022-01-22 DIAGNOSIS — C61 Malignant neoplasm of prostate: Secondary | ICD-10-CM

## 2022-01-22 DIAGNOSIS — D6869 Other thrombophilia: Secondary | ICD-10-CM | POA: Diagnosis not present

## 2022-01-22 DIAGNOSIS — I4819 Other persistent atrial fibrillation: Secondary | ICD-10-CM | POA: Diagnosis not present

## 2022-01-22 NOTE — Telephone Encounter (Signed)
Pt notified with verbal understanding  °

## 2022-01-22 NOTE — Progress Notes (Signed)
Electrophysiology Office Note   Date:  01/22/2022   ID:  Brent, Wilkins 1941/07/07, MRN [id number]  PCP:  Brent Spar, MD  Cardiologist:  Brent Wilkins Primary Electrophysiologist:  Brent Frasier [name], MD    No chief complaint on file.     History of Present Illness: Brent Wilkins is a 81 y.o. male who is being seen today for the evaluation of atrial fibrillation at the request of Brent Spar, MD. Presenting today for electrophysiology evaluation.    He has a history significant for hypertension, persistent atrial fibrillation, hyperlipidemia.  He was hospitalized April 2020 after an episode of syncope.  He was found to be bradycardic and thus was not on AV nodal blockers.  At the time of hospitalization he was found to be orthostatic.  Review of ECG showed intermittent episodes of sinus rhythm.  He was loaded on dofetilide.  He had more frequent episodes of atrial fibrillation and is status post ablation 10/06/2019.  Today, denies symptoms of palpitations, chest pain, shortness of breath, orthopnea, PND, lower extremity edema, claudication, dizziness, presyncope, syncope, bleeding, or neurologic sequela. The patient is tolerating medications without difficulties.  He is overall feeling well.  He has no chest pain or shortness of breath.  He was admitted to the hospital January 2023 with anemia.  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  He is felt much improved with much more energy since that admission.  His son has ALS.  He has lost 75 pounds.  He is getting a feeding tube placed after Thanksgiving.  Past Medical History:  Diagnosis Date   Acute blood loss anemia 08/16/2021   Allergy    hay fever;  seasonal   Arthritis    right ankle   Cataract    surgery 2016   Chronic kidney disease    stones many years ago   Constipation 12/09/2018   Dizziness 12/13/2015   Dysphagia    Family history of malignant neoplasm of prostate 12/12/2017   Frequent urination at  night    GERD (gastroesophageal reflux disease)    History of arthroplasty of right ankle 02/04/2020   History of kidney stones    History of prostate cancer 06/16/2017   HTN (hypertension)    Hyperlipidemia LDL goal <100 10/26/2019   Hypertension    Phreesia 04/24/2020   Hypothyroidism    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 on Tikosyn and Eliquis b. s/p ablation in 10/2019 due to break-through episodes while on Tikosyn.    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 Rosana Hoes   Schatzki's ring 10/22/2011   Seasonal allergies    Syncope  dx. 8 yrs ago   Syncope, cardiogenic 03/20/2017   Vaccine counseling 09/15/2019   Past Surgical History:  Procedure Laterality Date   APPENDECTOMY     ATRIAL FIBRILLATION ABLATION N/A 10/06/2019   Procedure: ATRIAL FIBRILLATION ABLATION;  Surgeon: Constance Haw, MD;  Location: [facility];  Service: Cardiovascular;  Laterality: N/A;   CATARACT EXTRACTION W/PHACO Left 03/27/2015   Procedure: CATARACT EXTRACTION PHACO AND INTRAOCULAR LENS PLACEMENT LEFT EYE CDE=11.73;  Surgeon: Tonny Branch, MD;  Location: [facility];  Service: Ophthalmology;  Laterality: Left;   CATARACT EXTRACTION W/PHACO Right 03/30/2015   Procedure: CATARACT EXTRACTION PHACO AND INTRAOCULAR LENS PLACEMENT  RIGHT EYE CDE=7.35;  Surgeon: Tonny Branch, MD;  Location: [facility];  Service: Ophthalmology;  Laterality: Right;   CHOLECYSTECTOMY     COLONOSCOPY  07/23/2010   Dr. Vivi Ferns rectum, scattered pancolonic diverticula   COLONOSCOPY  08/10/1999   internal hemorrhoids,inflammatory polyp   ESOPHAGOGASTRODUODENOSCOPY  04/12/2008   Prominent Schatzki's ring, erosive reflux esophagitis, multiple antral erosions, small hiatal hernia, reactive gastropathy, status post dilation with 37F   ESOPHAGOGASTRODUODENOSCOPY  10/28/2011   Procedure:  ESOPHAGOGASTRODUODENOSCOPY (EGD);  Surgeon: Daneil Dolin, MD;  Location: [facility];  Service: Endoscopy;  Laterality: N/A;  1:30   ESOPHAGOGASTRODUODENOSCOPY N/A 09/10/2017   Dr. Gala Romney: Schatkzi's ring s/p dilation at GE junction, mild esophageal reflux esophagitis, medium sized hiatal hernia, normal duodenum. 53 and 82 Fr dilation   ESOPHAGOGASTRODUODENOSCOPY (EGD) WITH PROPOFOL N/A 08/17/2021   Moderate sized hiatal hernia, nodular GAVE 1 spot actively oozing status post APC.  Subsequent APC of remainder GAVE.   EYE SURGERY     HOT HEMOSTASIS  08/17/2021   Procedure: HOT HEMOSTASIS (ARGON PLASMA COAGULATION/BICAP);  Surgeon: Eloise Harman, DO;  Location: [facility];  Service: Endoscopy;;   JOINT REPLACEMENT N/A    Phreesia 01/15/2020   MALONEY DILATION  10/28/2011   Procedure: Venia Minks DILATION;  Surgeon: Daneil Dolin, MD;  [facility]: [facility];  Service: Endoscopy;  Laterality: N/A;   MALONEY DILATION N/A 09/10/2017   Procedure: Venia Minks DILATION;  Surgeon: Daneil Dolin, MD;  [facility];  Service: Endoscopy;  Laterality: N/A;   SAVORY DILATION  10/28/2011   Procedure: SAVORY DILATION;  Surgeon: Daneil Dolin, MD;  [facility]: [facility];  Service: Endoscopy;  Laterality: N/A;   TONSILLECTOMY     TOTAL ANKLE ARTHROPLASTY Left 11/25/2013   Procedure: TOTAL ANKLE ARTHOPLASTY;  Surgeon: Wylene Simmer, MD;  Location: [facility];  Service: Orthopedics;  Laterality: Left;   TOTAL ANKLE ARTHROPLASTY Right 07/14/2014   dr hewitt   TOTAL ANKLE ARTHROPLASTY Right 07/14/2014   Procedure: RIGHT TOTAL ANKLE ARTHOPLASTY;  Surgeon: Wylene Simmer, MD;  Location: [facility];  Service: Orthopedics;  Laterality: Right;     Current Outpatient Medications  Medication Sig Dispense Refill   diphenhydramine-acetaminophen (TYLENOL PM) 25-500 MG TABS tablet Take 1 tablet by mouth at bedtime as needed (sleep and mild discomfort).      dofetilide (TIKOSYN) 500 MCG capsule Take 1 capsule (500  mcg total) by mouth 2 (two) times daily. 180 capsule 2   ELIQUIS 5 MG TABS tablet TAKE 1 TABLET BY MOUTH TWICE  DAILY 200 tablet 2   ferrous sulfate 325 (65 FE) MG tablet Take 1 tablet (325 mg total) by mouth daily with breakfast.  3   finasteride (PROSCAR) 5 MG tablet Take 1 tablet by mouth daily.     fluticasone (FLONASE) 50 MCG/ACT nasal spray USE 1 SPRAY IN BOTH  NOSTRILS DAILY AS NEEDED  FOR ALLERGIES 32 g 2   gabapentin (NEURONTIN) 100 MG capsule Take 200 mg by mouth 2 (two) times daily.     levothyroxine (SYNTHROID) 75 MCG tablet Take 1 tablet (75 mcg total) by mouth daily. 90 tablet 3   loratadine (CLARITIN) 10 MG tablet Take 10 mg by mouth daily as needed for allergies.     Omega-3 Fatty Acids (RA FISH OIL) 1400 MG CPDR Take 1,400 mg by mouth 2 (two) times daily.     pantoprazole (PROTONIX) 40 MG tablet TAKE 1 TABLET BY MOUTH  DAILY BEFORE BREAKFAST 90 tablet 3  Polyethyl Glycol-Propyl Glycol 0.4-0.3 % SOLN Place 1 drop into both eyes 2 (two) times daily as needed (dry eyes).      Tamsulosin HCl (FLOMAX) 0.4 MG CAPS Take 0.4 mg by mouth daily after breakfast.      vitamin B-12 (CYANOCOBALAMIN) 1000 MCG tablet Take 1,000 mcg by mouth 3 (three) times a week.     losartan (COZAAR) 50 MG tablet Take 1 tablet (50 mg total) by mouth daily. 90 tablet 3   No current facility-administered medications for this visit.    Allergies:   Tetanus toxoids, Statins, Dicyclomine, Sulfamethoxazole, Doxycycline, Oxycodone, Penicillins, Sulfa antibiotics, and Xarelto [rivaroxaban]   Social History:  The patient  reports that he has never smoked. He has never used smokeless tobacco. He reports current alcohol use of about 2.0 standard drinks of alcohol per week. He reports that he does not use drugs.   Family History:  The patient's family history includes ALS in his son; Arthritis in his mother; Cancer in his father; Heart disease in his mother; Hypertension in his father; Prostate cancer in his brother  and father; Prostate cancer (age of onset: 39) in his son.   ROS:  Please see the history of present illness.   Otherwise, review of systems is positive for none.   All other systems are reviewed and negative.   PHYSICAL EXAM: VS:  BP (!) 154/90   Pulse 66   Ht '5\' 10"'$  (1.778 m)   Wt 199 lb (90.3 kg)   SpO2 98%   BMI 28.55 kg/m  , BMI Body mass index is 28.55 kg/m. GEN: Well nourished, well developed, in no acute distress  HEENT: normal  Neck: no JVD, carotid bruits, or masses Cardiac: RRR; no murmurs, rubs, or gallops,no edema  Respiratory:  clear to auscultation bilaterally, normal work of breathing GI: soft, nontender, nondistended, + BS MS: no deformity or atrophy  Skin: warm and dry Neuro:  Strength and sensation are intact Psych: euthymic mood, full affect  EKG:  EKG is ordered today. Personal review of the ekg ordered shows sinus rhythm, right bundle branch block, rate 66  Recent Labs: 08/16/2021: ALT 7 09/10/2021: BUN 17; Creatinine, Ser 1.19; Magnesium 2.2; Potassium 4.6; Sodium 140 11/12/2021: Hemoglobin 14.2; Platelets 254    Lipid Panel     Component Value Date/Time   CHOL 170 01/11/2021 0847   TRIG 139 01/11/2021 0847   HDL 44 01/11/2021 0847   CHOLHDL 4.7 04/28/2020 0834   CHOLHDL 3.6 01/28/2020 0952   VLDL 30 09/22/2017 1444   LDLCALC 101 (H) 01/11/2021 0847   LDLCALC 104 (H) 01/28/2020 0952     Wt Readings from Last 3 Encounters:  01/22/22 199 lb (90.3 kg)  11/28/21 198 lb 3.2 oz (89.9 kg)  09/28/21 196 lb 12.8 oz (89.3 kg)      Other studies Reviewed: Additional studies/ records that were reviewed today include: 30 day monitor 04/28/17 - personally reviewed  Review of the above records today demonstrates:  Atrial fibrillation and flutter seen throughout study. Multiple pauses up to 3 seconds with most occurring during early morning hours (presumably asleep) and one occurring at approximately 1 pm.  TTE 03/21/17 - Left ventricle: The cavity size  was normal. Wall thickness was   increased in a pattern of moderate LVH. Systolic function was   normal. The estimated ejection fraction was in the range of 60%   to 65%. Wall motion was normal; there were no regional wall   motion abnormalities. Doppler parameters  are consistent with   restrictive physiology, indicative of decreased left ventricular   diastolic compliance and/or increased left atrial pressure.   Doppler parameters are consistent with high ventricular filling   pressure. - Aortic valve: Moderately calcified annulus. Trileaflet. - Mitral valve: Mildly calcified annulus. - Left atrium: The atrium was severely dilated. - Right ventricle: Systolic function was mildly reduced. - Right atrium: The atrium was mildly to moderately dilated. - Tricuspid valve: There was mild-moderate regurgitation. - Pulmonary arteries: Incomplete TR jet to accurately estimated   PASP.  ASSESSMENT AND PLAN:  1.  Persistent atrial fibrillation/flutter: Currently on Eliquis 5 mg twice daily, dofetilide 500 mcg twice daily.  High risk medication monitoring for dofetilide.  CHA2DS2-VASc of 3.  Status post ablation 10/06/2019.  He remains in sinus rhythm.  We Roux Brandy continue with current management.  We Missie Gehrig check labs for dofetilide monitoring today.  2.  Syncope: None since rhythm control  3.  Hypertension: Elevated today.  He Gaylan Fauver check it at home and if it is greater than 130/80 he Christy Ehrsam let us know.  4.  Secondary hypercoagulable state: Currently on Eliquis for atrial fibrillation as above.   Current medicines are reviewed at length with the patient today.   The patient does not have concerns regarding his medicines.  The following changes were made today: None  Labs/ tests ordered today include:  Orders Placed This Encounter  Procedures   Basic metabolic panel   Magnesium   EKG 12-Lead      Disposition:   FU with Linzey Ramser 6 months  Signed, Magalie Almon [name], MD  01/22/2022 12:02  PM     [facility] [address] [place] [address] [phone] (office) [phone] (fax)

## 2022-01-22 NOTE — Patient Instructions (Signed)
Medication Instructions:  Your physician recommends that you continue on your current medications as directed. Please refer to the Current Medication list given to you today.  *If you need a refill on your cardiac medications before your next appointment, please call your pharmacy*   Lab Work: Tikosyn surveillance lab work today: BMET & Magnesium level  If you have labs (blood work) drawn today and your tests are completely normal, you will receive your results only by: [employer] (if you have MyChart) OR A paper copy in the mail If you have any lab test that is abnormal or we need to change your treatment, we will call you to review the results.   Testing/Procedures: None ordered   Follow-Up: At [facility], you and your health needs are our priority.  As part of our continuing mission to provide you with exceptional heart care, we have created designated Provider Care Teams.  These Care Teams include your primary Cardiologist (physician) and Advanced Practice Providers (APPs -  Physician Assistants and Nurse Practitioners) who all work together to provide you with the care you need, when you need it.  Your next appointment:   6 month(s)  The format for your next appointment:   In Person  Provider:   Allegra Lai, MD    Thank you for choosing [facility]!!   Trinidad Curet, RN [phone]  Other Instructions   Important Information About Sugar

## 2022-01-23 LAB — BASIC METABOLIC PANEL
BUN/Creatinine Ratio: 20 (ref 10–24)
BUN: 25 mg/dL (ref 8–27)
CO2: 19 mmol/L — ABNORMAL LOW (ref 20–29)
Calcium: 9.8 mg/dL (ref 8.6–10.2)
Chloride: 105 mmol/L (ref 96–106)
Creatinine, Ser: 1.25 mg/dL (ref 0.76–1.27)
Glucose: 88 mg/dL (ref 70–99)
Potassium: 4.7 mmol/L (ref 3.5–5.2)
Sodium: 142 mmol/L (ref 134–144)
eGFR: 57 mL/min/{1.73_m2} — ABNORMAL LOW (ref >59)

## 2022-01-23 LAB — MAGNESIUM: Magnesium: 2.4 mg/dL — ABNORMAL HIGH (ref 1.6–2.3)

## 2022-02-06 ENCOUNTER — Other Ambulatory Visit: Payer: Self-pay

## 2022-02-06 DIAGNOSIS — D500 Iron deficiency anemia secondary to blood loss (chronic): Secondary | ICD-10-CM

## 2022-02-11 DIAGNOSIS — E039 Hypothyroidism, unspecified: Secondary | ICD-10-CM | POA: Diagnosis not present

## 2022-02-11 DIAGNOSIS — E559 Vitamin D deficiency, unspecified: Secondary | ICD-10-CM | POA: Diagnosis not present

## 2022-02-11 DIAGNOSIS — D500 Iron deficiency anemia secondary to blood loss (chronic): Secondary | ICD-10-CM | POA: Diagnosis not present

## 2022-02-11 DIAGNOSIS — Z0001 Encounter for general adult medical examination with abnormal findings: Secondary | ICD-10-CM | POA: Diagnosis not present

## 2022-02-11 DIAGNOSIS — E785 Hyperlipidemia, unspecified: Secondary | ICD-10-CM | POA: Diagnosis not present

## 2022-02-12 LAB — CBC WITH DIFFERENTIAL/PLATELET
Basophils Absolute: 0 10*3/uL (ref 0.0–0.2)
Basos: 1 %
EOS (ABSOLUTE): 0.3 10*3/uL (ref 0.0–0.4)
Eos: 7 %
Hematocrit: 40.1 % (ref 37.5–51.0)
Hemoglobin: 13.7 g/dL (ref 13.0–17.7)
Immature Grans (Abs): 0 10*3/uL (ref 0.0–0.1)
Immature Granulocytes: 0 %
Lymphocytes Absolute: 1.5 10*3/uL (ref 0.7–3.1)
Lymphs: 35 %
MCH: 32.1 pg (ref 26.6–33.0)
MCHC: 34.2 g/dL (ref 31.5–35.7)
MCV: 94 fL (ref 79–97)
Monocytes Absolute: 0.3 10*3/uL (ref 0.1–0.9)
Monocytes: 7 %
Neutrophils Absolute: 2.2 10*3/uL (ref 1.4–7.0)
Neutrophils: 50 %
Platelets: 211 10*3/uL (ref 150–450)
RBC: 4.27 x10E6/uL (ref 4.14–5.80)
RDW: 13.2 % (ref 11.6–15.4)
WBC: 4.4 10*3/uL (ref 3.4–10.8)

## 2022-02-12 LAB — CMP14+EGFR
ALT: 7 IU/L (ref 0–44)
AST: 17 IU/L (ref 0–40)
Albumin/Globulin Ratio: 1.8 (ref 1.2–2.2)
Albumin: 3.9 g/dL (ref 3.7–4.7)
Alkaline Phosphatase: 66 IU/L (ref 44–121)
BUN/Creatinine Ratio: 10 (ref 10–24)
BUN: 13 mg/dL (ref 8–27)
Bilirubin Total: 0.5 mg/dL (ref 0.0–1.2)
CO2: 24 mmol/L (ref 20–29)
Calcium: 9.8 mg/dL (ref 8.6–10.2)
Chloride: 109 mmol/L — ABNORMAL HIGH (ref 96–106)
Creatinine, Ser: 1.26 mg/dL (ref 0.76–1.27)
Globulin, Total: 2.2 g/dL (ref 1.5–4.5)
Glucose: 106 mg/dL — ABNORMAL HIGH (ref 70–99)
Potassium: 5 mmol/L (ref 3.5–5.2)
Sodium: 144 mmol/L (ref 134–144)
Total Protein: 6.1 g/dL (ref 6.0–8.5)
eGFR: 57 mL/min/{1.73_m2} — ABNORMAL LOW (ref >59)

## 2022-02-12 LAB — LIPID PANEL
Chol/HDL Ratio: 3 ratio (ref 0.0–5.0)
Cholesterol, Total: 152 mg/dL (ref 100–199)
HDL: 50 mg/dL (ref >39)
LDL Chol Calc (NIH): 84 mg/dL (ref 0–99)
Triglycerides: 97 mg/dL (ref 0–149)
VLDL Cholesterol Cal: 18 mg/dL (ref 5–40)

## 2022-02-12 LAB — VITAMIN D 25 HYDROXY (VIT D DEFICIENCY, FRACTURES): Vit D, 25-Hydroxy: 28.6 ng/mL — ABNORMAL LOW (ref 30.0–100.0)

## 2022-02-12 LAB — HEMOGLOBIN A1C
Est. average glucose Bld gHb Est-mCnc: 108 mg/dL
Hgb A1c MFr Bld: 5.4 % (ref 4.8–5.6)

## 2022-02-12 LAB — FERRITIN: Ferritin: 60 ng/mL (ref 30–400)

## 2022-02-12 LAB — TSH+FREE T4
Free T4: 1.35 ng/dL (ref 0.82–1.77)
TSH: 3.62 u[IU]/mL (ref 0.450–4.500)

## 2022-02-12 LAB — PSA: Prostate Specific Ag, Serum: 2.2 ng/mL (ref 0.0–4.0)

## 2022-02-21 ENCOUNTER — Ambulatory Visit (INDEPENDENT_AMBULATORY_CARE_PROVIDER_SITE_OTHER): Payer: Medicare Other

## 2022-02-21 VITALS — BP 122/79 | HR 73 | Ht 70.0 in | Wt 197.8 lb

## 2022-02-21 DIAGNOSIS — Z0000 Encounter for general adult medical examination without abnormal findings: Secondary | ICD-10-CM | POA: Diagnosis not present

## 2022-02-21 NOTE — Progress Notes (Signed)
Subjective:   Brent Wilkins is a 81 y.o. male who presents for Medicare Annual/Subsequent preventive examination.  Review of Systems     Brent Wilkins , Thank you for taking time to come for your Medicare Wellness Visit. I appreciate your ongoing commitment to your health goals. Please review the following plan we discussed and let me know if I can assist you in the future.   These are the goals we discussed:  Goals      Exercise 150 minutes per week (moderate activity)     Walks dog daily and works in the yard     Prevent falls        This is a list of the screening recommended for you and due dates:  Health Maintenance  Topic Date Due   COVID-19 Vaccine (4 - Booster for Moderna series) 07/07/2021   Flu Shot  03/05/2022   Pneumonia Vaccine  Completed   Zoster (Shingles) Vaccine  Completed   HPV Vaccine  Aged Out   Tetanus Vaccine  Discontinued          Objective:    There were no vitals filed for this visit. There is no height or weight on file to calculate BMI.     08/17/2021   10:50 AM 08/16/2021    8:43 AM 01/31/2020    2:12 PM 10/06/2019    7:20 AM 12/07/2018   10:14 AM 09/10/2017   10:08 AM 05/05/2017    5:28 PM  Advanced Directives  Does Patient Have a Medical Advance Directive? No No No No No No No  Would patient like information on creating a medical advance directive? No - Patient declined No - Patient declined Yes (ED - Information included in AVS) No - Patient declined No - Patient declined No - Patient declined No - Patient declined    Current Medications (verified) Outpatient Encounter Medications as of 02/21/2022  Medication Sig   diphenhydramine-acetaminophen (TYLENOL PM) 25-500 MG TABS tablet Take 1 tablet by mouth at bedtime as needed (sleep and mild discomfort).    dofetilide (TIKOSYN) 500 MCG capsule Take 1 capsule (500 mcg total) by mouth 2 (two) times daily.   ELIQUIS 5 MG TABS tablet TAKE 1 TABLET BY MOUTH TWICE  DAILY   ferrous sulfate 325 (65  FE) MG tablet Take 1 tablet (325 mg total) by mouth daily with breakfast.   finasteride (PROSCAR) 5 MG tablet Take 1 tablet by mouth daily.   fluticasone (FLONASE) 50 MCG/ACT nasal spray USE 1 SPRAY IN BOTH  NOSTRILS DAILY AS NEEDED  FOR ALLERGIES   gabapentin (NEURONTIN) 100 MG capsule Take 200 mg by mouth 2 (two) times daily.   levothyroxine (SYNTHROID) 75 MCG tablet Take 1 tablet (75 mcg total) by mouth daily.   loratadine (CLARITIN) 10 MG tablet Take 10 mg by mouth daily as needed for allergies.   losartan (COZAAR) 50 MG tablet Take 1 tablet (50 mg total) by mouth daily.   Omega-3 Fatty Acids (RA FISH OIL) 1400 MG CPDR Take 1,400 mg by mouth 2 (two) times daily.   pantoprazole (PROTONIX) 40 MG tablet TAKE 1 TABLET BY MOUTH  DAILY BEFORE BREAKFAST   Polyethyl Glycol-Propyl Glycol 0.4-0.3 % SOLN Place 1 drop into both eyes 2 (two) times daily as needed (dry eyes).    Tamsulosin HCl (FLOMAX) 0.4 MG CAPS Take 0.4 mg by mouth daily after breakfast.    vitamin B-12 (CYANOCOBALAMIN) 1000 MCG tablet Take 1,000 mcg by mouth 3 (three) times a  week.   No facility-administered encounter medications on file as of 02/21/2022.    Allergies (verified) Tetanus toxoids, Statins, Dicyclomine, Sulfamethoxazole, Doxycycline, Oxycodone, Penicillins, Sulfa antibiotics, and Xarelto [rivaroxaban]   History: Past Medical History:  Diagnosis Date   Acute blood loss anemia 08/16/2021   Allergy    hay fever;  seasonal   Arthritis    right ankle   Cataract    surgery 2016   Chronic kidney disease    stones many years ago   Constipation 12/09/2018   Dizziness 12/13/2015   Dysphagia    Family history of malignant neoplasm of prostate 12/12/2017   Frequent urination at night    GERD (gastroesophageal reflux disease)    History of arthroplasty of right ankle 02/04/2020   History of kidney stones    History of prostate cancer 06/16/2017   HTN (hypertension)    Hyperlipidemia LDL goal <100 10/26/2019   Hypertension     Phreesia 04/24/2020   Hypothyroidism    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 on Tikosyn and Eliquis b. s/p ablation in 10/2019 due to break-through episodes while on Tikosyn.    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 Rosana Hoes   Schatzki's ring 10/22/2011   Seasonal allergies    Syncope  dx. 8 yrs ago   Syncope, cardiogenic 03/20/2017   Vaccine counseling 09/15/2019   Past Surgical History:  Procedure Laterality Date   APPENDECTOMY     ATRIAL FIBRILLATION ABLATION N/A 10/06/2019   Procedure: ATRIAL FIBRILLATION ABLATION;  Surgeon: Constance Haw, MD;  Location: [place] [facility];  Service: Cardiovascular;  Laterality: N/A;   CATARACT EXTRACTION W/PHACO Left 03/27/2015   Procedure: CATARACT EXTRACTION PHACO AND INTRAOCULAR LENS PLACEMENT LEFT EYE CDE=11.73;  Surgeon: Tonny Branch, MD;  Location: [facility];  Service: Ophthalmology;  Laterality: Left;   CATARACT EXTRACTION W/PHACO Right 03/30/2015   Procedure: CATARACT EXTRACTION PHACO AND INTRAOCULAR LENS PLACEMENT RIGHT EYE CDE=7.35;  Surgeon: Tonny Branch, MD;  Location: [facility];  Service: Ophthalmology;  Laterality: Right;   CHOLECYSTECTOMY     COLONOSCOPY  07/23/2010   Dr. Vivi Ferns rectum, scattered pancolonic diverticula   COLONOSCOPY  08/10/1999   internal hemorrhoids,inflammatory polyp   ESOPHAGOGASTRODUODENOSCOPY  04/12/2008   Prominent Schatzki's ring, erosive reflux esophagitis, multiple antral erosions, small hiatal hernia, reactive gastropathy, status post dilation with 46F   ESOPHAGOGASTRODUODENOSCOPY  10/28/2011   Procedure: ESOPHAGOGASTRODUODENOSCOPY (EGD);  Surgeon: Daneil Dolin, MD;  [facility];  Service: Endoscopy;  Laterality: N/A;  1:30   ESOPHAGOGASTRODUODENOSCOPY N/A 09/10/2017   Dr. Gala Romney: Schatkzi's ring s/p dilation at GE junction, mild esophageal  reflux esophagitis, medium sized hiatal hernia, normal duodenum. 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  Subsequent APC of remainder GAVE.   EYE SURGERY     HOT HEMOSTASIS  08/17/2021   Procedure: HOT HEMOSTASIS (ARGON PLASMA COAGULATION/BICAP);  Surgeon: Eloise Harman, DO;  [facility];  Service: Endoscopy;;   JOINT REPLACEMENT N/A    Phreesia 01/15/2020   MALONEY DILATION  10/28/2011   Procedure: Venia Minks DILATION;  Surgeon: Daneil Dolin, MD;  [facility];  Service: Endoscopy;  Laterality: N/A;   MALONEY DILATION N/A 09/10/2017  Procedure: MALONEY DILATION;  Surgeon: Daneil Dolin, MD;  [facility];  Service: Endoscopy;  Laterality: N/A;   SAVORY DILATION  10/28/2011   Procedure: SAVORY DILATION;  Surgeon: Daneil Dolin, MD;  [facility];  Service: Endoscopy;  Laterality: N/A;   TONSILLECTOMY     TOTAL ANKLE ARTHROPLASTY Left 11/25/2013   Procedure: TOTAL ANKLE ARTHOPLASTY;  Surgeon: Wylene Simmer, MD;  Location: [place];  Service: Orthopedics;  Laterality: Left;   TOTAL ANKLE ARTHROPLASTY Right 07/14/2014   dr hewitt   TOTAL ANKLE ARTHROPLASTY Right 07/14/2014   Procedure: RIGHT TOTAL ANKLE ARTHOPLASTY;  Surgeon: Wylene Simmer, MD;  Location: [place];  Service: Orthopedics;  Laterality: Right;   Family History  Problem Relation Age of Onset   Prostate cancer Father    Cancer Father        prostate to bone   Hypertension Father    Prostate cancer Son 88   ALS Son    Prostate cancer Brother    Heart disease Mother        died at 61   Arthritis Mother    Colon cancer Neg Hx    Social History   Socioeconomic History   Marital status: Married    Spouse name: Candace   Number of children: 2   Years of education: Not on file   Highest education level: Bachelor's degree (e.g., BA, AB, BS)   Occupational History   Occupation: retired; Ambulance person: RETIRED  Tobacco Use   Smoking status: Never   Smokeless tobacco: Never  Vaping Use   Vaping Use: Never used  Substance and Sexual Activity   Alcohol use: Yes    Alcohol/week: 2.0 standard drinks of alcohol    Types: 1 Glasses of wine, 1 Cans of beer per week    Comment: glass of wine or "couple of beers" occasionally    Drug use: No   Sexual activity: Yes    Partners: Female  Other Topics Concern   Not on file  Social History Narrative   Lives with wife Candace.       Moved from [place], [place]   Retired from [employer].   Plays golf.   Works with [employer] and [employer] for [employer].   Wears seatbelt.   Drives.   Eats all food groups.   Never smoked.   Married for over 68 years June  (2020)   Has two grown children, live in [place] and [place].   Social Determinants of Health   Financial Resource Strain: Low Risk  (02/01/2021)   Overall Financial Resource Strain (CARDIA)    Difficulty of Paying Living Expenses: Not hard at all  Food Insecurity: No Food Insecurity (02/01/2021)   Hunger Vital Sign    Worried About Running Out of Food in the Last Year: Never true    Ran Out of Food in the Last Year: Never true  Transportation Needs: No Transportation Needs (02/01/2021)   PRAPARE - Hydrologist (Medical): No    Lack of Transportation (Non-Medical): No  Physical Activity: Sufficiently Active (01/31/2020)   Exercise Vital Sign    Days of Exercise per Week: 7 days    Minutes of Exercise per Session: 60 min  Stress: No Stress Concern Present (01/31/2020)   [place]    Feeling of Stress : Not at all  Social Connections: [place] (02/01/2021)   Social  Connection and Isolation Panel [NHANES]    Frequency of Communication with Friends and Family: More than three times a week    Frequency of Social  Gatherings with Friends and Family: More than three times a week    Attends Religious Services: More than 4 times per year    Active Member of [employer] or Organizations: Yes    Attends Music therapist: More than 4 times per year    Marital Status: Married    Tobacco Counseling Counseling given: Not Answered   Clinical Intake:                          Activities of Daily Living    08/16/2021   12:06 PM 08/16/2021   12:05 PM  In your present state of health, do you have any difficulty performing the following activities:  Hearing?  1  Vision?  0  Difficulty concentrating or making decisions?  0  Walking or climbing stairs?  0  Dressing or bathing?  0  Doing errands, shopping? 0     Patient Care Team: Lindell Spar, MD as PCP - General (Internal Medicine) Constance Haw, MD as PCP - Electrophysiology (Cardiology) Fay Records, MD as PCP - Cardiology (Cardiology) Gala Romney Cristopher Estimable, MD (Gastroenterology) Myrlene Broker, MD as Attending Physician (Urology)  Indicate any recent Medical Services you may have received from other than [facility] providers in the past year (date may be approximate).     Assessment:   This is a routine wellness examination for Brent Wilkins.  Hearing/Vision screen No results found.  Dietary issues and exercise activities discussed:     Goals Addressed   None   Depression Screen    11/28/2021    9:03 AM 02/01/2021    1:59 PM 02/01/2021    1:58 PM 01/15/2021    1:22 PM 11/16/2020    1:52 PM 10/16/2020    1:08 PM 07/25/2020    1:24 PM  PHQ 2/9 Scores  PHQ - 2 Score 0 0 0 0 0 0 0    Fall Risk    11/28/2021    9:03 AM 08/22/2021   10:33 AM 02/01/2021    1:59 PM 01/15/2021    1:22 PM 11/16/2020    1:52 PM  [place] in the past year? 0 0 0 0 0  Number falls in past yr: 0 0 0 0 0  Injury with Fall? 0 0 0 0 0  Risk for fall due to : No Fall Risks No Fall Risks  No Fall Risks No Fall Risks  Follow up Falls  evaluation completed Falls evaluation completed  Falls evaluation completed Falls evaluation completed    Brent Wilkins:  Any stairs in or around the home? Yes  If so, are there any without handrails? No  Home free of loose throw rugs in walkways, pet beds, electrical cords, etc? Yes  Adequate lighting in your home to reduce risk of falls? Yes   ASSISTIVE DEVICES UTILIZED TO PREVENT FALLS:  Life alert? No  Use of a cane, walker or w/c? Yes  Grab bars in the bathroom? Yes  Shower chair or bench in shower? Yes  Elevated toilet seat or a handicapped toilet? Yes   TIMED UP AND GO:  Was the test performed? Yes .  Length of time to ambulate 10 feet: 60 sec.   Gait steady and fast without use  of assistive device  Cognitive Function:        02/01/2021    2:01 PM 01/31/2020    2:04 PM 12/31/2018    8:57 AM  6CIT Screen  What Year? 0 points 0 points 0 points  What month? 0 points 0 points 0 points  What time? 0 points 0 points 0 points  Count back from 20 0 points 0 points 0 points  Months in reverse 0 points 0 points 0 points  Repeat phrase 0 points 0 points 0 points  Total Score 0 points 0 points 0 points    Immunizations Immunization History  Administered Date(s) Administered   Fluad Quad(high Dose 65+) 04/27/2019, 04/27/2020, 05/12/2021   Influenza, High Dose Seasonal PF 05/07/2017, 06/12/2018   Moderna SARS-COV2 Booster Vaccination 05/12/2021   Moderna Sars-Covid-2 Vaccination 08/17/2019, 09/17/2019, 04/09/2020   Pneumococcal Conjugate-13 06/16/2017   Pneumococcal Polysaccharide-23 01/02/2018   Zoster Recombinat (Shingrix) 04/07/2018, 09/22/2018     Flu Vaccine status: Up to date  Pneumococcal vaccine status: Up to date  Covid-19 vaccine status: Completed vaccines  Qualifies for Shingles Vaccine? Yes   Zostavax completed Yes   Shingrix Completed?: Yes  Screening Tests Health Maintenance  Topic Date Due   COVID-19 Vaccine (4 -  Booster for Moderna series) 07/07/2021   INFLUENZA VACCINE  03/05/2022   Pneumonia Vaccine 57+ Years old  Completed   Zoster Vaccines- Shingrix  Completed   HPV VACCINES  Aged Out   TETANUS/TDAP  Discontinued    Health Maintenance  Health Maintenance Due  Topic Date Due   COVID-19 Vaccine (4 - Booster for Moderna series) 07/07/2021    Colorectal cancer screening: No longer required.   Lung Cancer Screening: (Low Dose CT Chest recommended if Age 31-80 years, 30 pack-year currently smoking OR have quit w/in 15years.) does not qualify.   Lung Cancer Screening Referral: NA  Additional Screening:  Hepatitis C Screening: does qualify; Completed   Vision Screening: Recommended annual ophthalmology exams for early detection of glaucoma and other disorders of the eye. Is the patient up to date with their annual eye exam?  No  Who is the provider or what is the name of the office in which the patient attends annual eye exams? My eye doctor in [place]: Recommended annual dental exams for proper oral hygiene  Community Resource Referral / Chronic Care Management: CRR required this visit?  No   CCM required this visit?  No      Plan:     I have personally reviewed and noted the following in the patient's chart:   Medical and social history Use of alcohol, tobacco or illicit drugs  Current medications and supplements including opioid prescriptions. Patient is not currently taking opioid prescriptions. Functional ability and status Nutritional status Physical activity Advanced directives List of other physicians Hospitalizations, surgeries, and ER visits in previous 12 months Vitals Screenings to include cognitive, depression, and falls Referrals and appointments  In addition, I have reviewed and discussed with patient certain preventive protocols, quality metrics, and best practice recommendations. A written personalized care plan for preventive services  as well as general preventive health recommendations were provided to patient.     Brent Wilkins, [place]   02/21/2022   Nurse Notes:  Brent Wilkins , Thank you for taking time to come for your Medicare Wellness Visit. I appreciate your ongoing commitment to your health goals. Please review the following plan we discussed and let me know if I can assist you in  the future.   These are the goals we discussed:  Goals      Exercise 150 minutes per week (moderate activity)     Walks dog daily and works in the yard     Prevent falls        This is a list of the screening recommended for you and due dates:  Health Maintenance  Topic Date Due   COVID-19 Vaccine (4 - Booster for Moderna series) 07/07/2021   Flu Shot  03/05/2022   Pneumonia Vaccine  Completed   Zoster (Shingles) Vaccine  Completed   HPV Vaccine  Aged Out   Tetanus Vaccine  Discontinued

## 2022-02-21 NOTE — Patient Instructions (Signed)
[name] , Thank you for taking time to come for your Medicare Wellness Visit. I appreciate your ongoing commitment to your health goals. Please review the following plan we discussed and let me know if I can assist you in the future.   Screening recommendations/referrals:  Recommended yearly ophthalmology/optometry visit for glaucoma screening and checkup Recommended yearly dental visit for hygiene and checkup  Vaccinations: Influenza vaccine: completed Pneumococcal vaccine: completed Shingles vaccine: completed    Advanced directives: patient declined  Conditions/risks identified: falls, hypertension  Next appointment: 1 year  Preventive Care 83 Years and Older, Male Preventive care refers to lifestyle choices and visits with your health care provider that can promote health and wellness. What does preventive care include? A yearly physical exam. This is also called an annual well check. Dental exams once or twice a year. Routine eye exams. Ask your health care provider how often you should have your eyes checked. Personal lifestyle choices, including: Daily care of your teeth and gums. Regular physical activity. Eating a healthy diet. Avoiding tobacco and drug use. Limiting alcohol use. Practicing safe sex. Taking low doses of aspirin every day. Taking vitamin and mineral supplements as recommended by your health care provider. What happens during an annual well check? The services and screenings done by your health care provider during your annual well check will depend on your age, overall health, lifestyle risk factors, and family history of disease. Counseling  Your health care provider may ask you questions about your: Alcohol use. Tobacco use. Drug use. Emotional well-being. Home and relationship well-being. Sexual activity. Eating habits. History of falls. Memory and ability to understand (cognition). Work and work Statistician. Screening  You may have the  following tests or measurements: Height, weight, and BMI. Blood pressure. Lipid and cholesterol levels. These may be checked every 5 years, or more frequently if you are over 47 years old. Skin check. Lung cancer screening. You may have this screening every year starting at age 58 if you have a 30-pack-year history of smoking and currently smoke or have quit within the past 15 years. Fecal occult blood test (FOBT) of the stool. You may have this test every year starting at age 15. Flexible sigmoidoscopy or colonoscopy. You may have a sigmoidoscopy every 5 years or a colonoscopy every 10 years starting at age 84. Prostate cancer screening. Recommendations will vary depending on your family history and other risks. Hepatitis C blood test. Hepatitis B blood test. Sexually transmitted disease (STD) testing. Diabetes screening. This is done by checking your blood sugar (glucose) after you have not eaten for a while (fasting). You may have this done every 1-3 years. Abdominal aortic aneurysm (AAA) screening. You may need this if you are a current or former smoker. Osteoporosis. You may be screened starting at age 9 if you are at high risk. Talk with your health care provider about your test results, treatment options, and if necessary, the need for more tests. Vaccines  Your health care provider may recommend certain vaccines, such as: Influenza vaccine. This is recommended every year. Tetanus, diphtheria, and acellular pertussis (Tdap, Td) vaccine. You may need a Td booster every 10 years. Zoster vaccine. You may need this after age 55. Pneumococcal 13-valent conjugate (PCV13) vaccine. One dose is recommended after age 62. Pneumococcal polysaccharide (PPSV23) vaccine. One dose is recommended after age 54. Talk to your health care provider about which screenings and vaccines you need and how often you need them. This information is not intended to  replace advice given to you by your health care  provider. Make sure you discuss any questions you have with your health care provider. Document Released: 08/18/2015 Document Revised: 04/10/2016 Document Reviewed: 05/23/2015 Elsevier Interactive Patient Education  2017 [employer] Prevention in the Home Falls can cause injuries. They can happen to people of all ages. There are many things you can do to make your home safe and to help prevent falls. What can I do on the outside of my home? Regularly fix the edges of walkways and driveways and fix any cracks. Remove anything that might make you trip as you walk through a door, such as a raised step or threshold. Trim any bushes or trees on the path to your home. Use bright outdoor lighting. Clear any walking paths of anything that might make someone trip, such as rocks or tools. Regularly check to see if handrails are loose or broken. Make sure that both sides of any steps have handrails. Any raised decks and porches should have guardrails on the edges. Have any leaves, snow, or ice cleared regularly. Use sand or salt on walking paths during winter. Clean up any spills in your garage right away. This includes oil or grease spills. What can I do in the bathroom? Use night lights. Install grab bars by the toilet and in the tub and shower. Do not use towel bars as grab bars. Use non-skid mats or decals in the tub or shower. If you need to sit down in the shower, use a plastic, non-slip stool. Keep the floor dry. Clean up any water that spills on the floor as soon as it happens. Remove soap buildup in the tub or shower regularly. Attach bath mats securely with double-sided non-slip rug tape. Do not have throw rugs and other things on the floor that can make you trip. What can I do in the bedroom? Use night lights. Make sure that you have a light by your bed that is easy to reach. Do not use any sheets or blankets that are too big for your bed. They should not hang down onto the  floor. Have a firm chair that has side arms. You can use this for support while you get dressed. Do not have throw rugs and other things on the floor that can make you trip. What can I do in the kitchen? Clean up any spills right away. Avoid walking on wet floors. Keep items that you use a lot in easy-to-reach places. If you need to reach something above you, use a strong step stool that has a grab bar. Keep electrical cords out of the way. Do not use floor polish or wax that makes floors slippery. If you must use wax, use non-skid floor wax. Do not have throw rugs and other things on the floor that can make you trip. What can I do with my stairs? Do not leave any items on the stairs. Make sure that there are handrails on both sides of the stairs and use them. Fix handrails that are broken or loose. Make sure that handrails are as long as the stairways. Check any carpeting to make sure that it is firmly attached to the stairs. Fix any carpet that is loose or worn. Avoid having throw rugs at the top or bottom of the stairs. If you do have throw rugs, attach them to the floor with carpet tape. Make sure that you have a light switch at the top of the stairs and the  bottom of the stairs. If you do not have them, ask someone to add them for you. What else can I do to help prevent falls? Wear shoes that: Do not have high heels. Have rubber bottoms. Are comfortable and fit you well. Are closed at the toe. Do not wear sandals. If you use a stepladder: Make sure that it is fully opened. Do not climb a closed stepladder. Make sure that both sides of the stepladder are locked into place. Ask someone to hold it for you, if possible. Clearly mark and make sure that you can see: Any grab bars or handrails. First and last steps. Where the edge of each step is. Use tools that help you move around (mobility aids) if they are needed. These include: Canes. Walkers. Scooters. Crutches. Turn on the  lights when you go into a dark area. Replace any light bulbs as soon as they burn out. Set up your furniture so you have a clear path. Avoid moving your furniture around. If any of your floors are uneven, fix them. If there are any pets around you, be aware of where they are. Review your medicines with your doctor. Some medicines can make you feel dizzy. This can increase your chance of falling. Ask your doctor what other things that you can do to help prevent falls. This information is not intended to replace advice given to you by your health care provider. Make sure you discuss any questions you have with your health care provider. Document Released: 05/18/2009 Document Revised: 12/28/2015 Document Reviewed: 08/26/2014 Elsevier Interactive Patient Education  2017 [employer].

## 2022-02-26 ENCOUNTER — Other Ambulatory Visit: Payer: Self-pay | Admitting: Cardiology

## 2022-03-19 DIAGNOSIS — L570 Actinic keratosis: Secondary | ICD-10-CM | POA: Diagnosis not present

## 2022-03-19 DIAGNOSIS — D225 Melanocytic nevi of trunk: Secondary | ICD-10-CM | POA: Diagnosis not present

## 2022-03-19 DIAGNOSIS — X32XXXD Exposure to sunlight, subsequent encounter: Secondary | ICD-10-CM | POA: Diagnosis not present

## 2022-04-28 ENCOUNTER — Encounter (HOSPITAL_COMMUNITY): Payer: Self-pay

## 2022-04-28 ENCOUNTER — Emergency Department (HOSPITAL_COMMUNITY): Payer: Medicare Other

## 2022-04-28 ENCOUNTER — Emergency Department (HOSPITAL_COMMUNITY)
Admission: EM | Admit: 2022-04-28 | Discharge: 2022-04-28 | Disposition: A | Discharge status: Home / Self Care | Payer: Medicare Other | Attending: Emergency Medicine | Admitting: Emergency Medicine

## 2022-04-28 ENCOUNTER — Other Ambulatory Visit: Payer: Self-pay

## 2022-04-28 DIAGNOSIS — I129 Hypertensive chronic kidney disease with stage 1 through stage 4 chronic kidney disease, or unspecified chronic kidney disease: Secondary | ICD-10-CM | POA: Diagnosis not present

## 2022-04-28 DIAGNOSIS — E039 Hypothyroidism, unspecified: Secondary | ICD-10-CM | POA: Insufficient documentation

## 2022-04-28 DIAGNOSIS — R1012 Left upper quadrant pain: Secondary | ICD-10-CM | POA: Diagnosis not present

## 2022-04-28 DIAGNOSIS — Z8546 Personal history of malignant neoplasm of prostate: Secondary | ICD-10-CM | POA: Insufficient documentation

## 2022-04-28 DIAGNOSIS — K859 Acute pancreatitis without necrosis or infection, unspecified: Secondary | ICD-10-CM | POA: Insufficient documentation

## 2022-04-28 DIAGNOSIS — I10 Essential (primary) hypertension: Secondary | ICD-10-CM | POA: Diagnosis not present

## 2022-04-28 DIAGNOSIS — R1013 Epigastric pain: Secondary | ICD-10-CM | POA: Diagnosis present

## 2022-04-28 DIAGNOSIS — R109 Unspecified abdominal pain: Secondary | ICD-10-CM | POA: Diagnosis not present

## 2022-04-28 DIAGNOSIS — I700 Atherosclerosis of aorta: Secondary | ICD-10-CM | POA: Diagnosis not present

## 2022-04-28 DIAGNOSIS — K449 Diaphragmatic hernia without obstruction or gangrene: Secondary | ICD-10-CM | POA: Diagnosis not present

## 2022-04-28 DIAGNOSIS — Z7901 Long term (current) use of anticoagulants: Secondary | ICD-10-CM | POA: Diagnosis not present

## 2022-04-28 DIAGNOSIS — N189 Chronic kidney disease, unspecified: Secondary | ICD-10-CM | POA: Insufficient documentation

## 2022-04-28 DIAGNOSIS — Z9049 Acquired absence of other specified parts of digestive tract: Secondary | ICD-10-CM | POA: Diagnosis not present

## 2022-04-28 DIAGNOSIS — Z79899 Other long term (current) drug therapy: Secondary | ICD-10-CM | POA: Diagnosis not present

## 2022-04-28 DIAGNOSIS — I878 Other specified disorders of veins: Secondary | ICD-10-CM | POA: Diagnosis not present

## 2022-04-28 LAB — COMPREHENSIVE METABOLIC PANEL
ALT: 11 U/L (ref 0–44)
AST: 21 U/L (ref 15–41)
Albumin: 3.6 g/dL (ref 3.5–5.0)
Alkaline Phosphatase: 52 U/L (ref 38–126)
Anion gap: 7 (ref 5–15)
BUN: 18 mg/dL (ref 8–23)
CO2: 27 mmol/L (ref 22–32)
Calcium: 9.3 mg/dL (ref 8.9–10.3)
Chloride: 110 mmol/L (ref 98–111)
Creatinine, Ser: 1.26 mg/dL — ABNORMAL HIGH (ref 0.61–1.24)
GFR, Estimated: 57 mL/min — ABNORMAL LOW (ref >60)
Glucose, Bld: 121 mg/dL — ABNORMAL HIGH (ref 70–99)
Potassium: 4.5 mmol/L (ref 3.5–5.1)
Sodium: 144 mmol/L (ref 135–145)
Total Bilirubin: 0.7 mg/dL (ref 0.3–1.2)
Total Protein: 6.3 g/dL — ABNORMAL LOW (ref 6.5–8.1)

## 2022-04-28 LAB — CBC WITH DIFFERENTIAL/PLATELET
Abs Immature Granulocytes: 0.03 10*3/uL (ref 0.00–0.07)
Basophils Absolute: 0 10*3/uL (ref 0.0–0.1)
Basophils Relative: 0 %
Eosinophils Absolute: 0.2 10*3/uL (ref 0.0–0.5)
Eosinophils Relative: 2 %
HCT: 43 % (ref 39.0–52.0)
Hemoglobin: 14.5 g/dL (ref 13.0–17.0)
Immature Granulocytes: 0 %
Lymphocytes Relative: 15 %
Lymphs Abs: 1.6 10*3/uL (ref 0.7–4.0)
MCH: 32.2 pg (ref 26.0–34.0)
MCHC: 33.7 g/dL (ref 30.0–36.0)
MCV: 95.6 fL (ref 80.0–100.0)
Monocytes Absolute: 0.6 10*3/uL (ref 0.1–1.0)
Monocytes Relative: 5 %
Neutro Abs: 8.1 10*3/uL — ABNORMAL HIGH (ref 1.7–7.7)
Neutrophils Relative %: 78 %
Platelets: 198 10*3/uL (ref 150–400)
RBC: 4.5 MIL/uL (ref 4.22–5.81)
RDW: 13.2 % (ref 11.5–15.5)
WBC: 10.5 10*3/uL (ref 4.0–10.5)
nRBC: 0 % (ref 0.0–0.2)

## 2022-04-28 LAB — LIPASE, BLOOD: Lipase: 5004 U/L — ABNORMAL HIGH (ref 11–51)

## 2022-04-28 LAB — TROPONIN I (HIGH SENSITIVITY)
Troponin I (High Sensitivity): 4 ng/L (ref <18)
Troponin I (High Sensitivity): 4 ng/L (ref <18)

## 2022-04-28 MED ORDER — HYDROCODONE-ACETAMINOPHEN 5-325 MG PO TABS: 1.0000 | ORAL_TABLET | Freq: Four times a day (QID) | ORAL | 0 refills | Status: DC | PRN

## 2022-04-28 MED ORDER — IOHEXOL 300 MG/ML  SOLN
100.0000 mL | Freq: Once | INTRAMUSCULAR | Status: AC | PRN
  Administered 2022-04-28: 100 mL via INTRAVENOUS

## 2022-04-28 MED ORDER — POLYETHYLENE GLYCOL 3350 17 G PO PACK: 17.0000 g | PACK | Freq: Every day | ORAL | 0 refills | Status: AC | PRN

## 2022-04-28 MED ORDER — ONDANSETRON 8 MG PO TBDP: 8.0000 mg | ORAL_TABLET | Freq: Three times a day (TID) | ORAL | 0 refills | Status: DC | PRN

## 2022-04-28 MED ORDER — PANTOPRAZOLE SODIUM 40 MG IV SOLR
40.0000 mg | Freq: Once | INTRAVENOUS | Status: AC
  Administered 2022-04-28: 40 mg via INTRAVENOUS
  Filled 2022-04-28: qty 10

## 2022-04-28 NOTE — ED Provider Notes (Signed)
[facility] EMERGENCY DEPARTMENT Provider Note   CSN: [id number] Arrival date & time: 04/28/22  3825     History  Chief Complaint  Patient presents with   Abdominal Pain    Brent Wilkins is a 81 y.o. male.   Abdominal Pain Patient presents abdominal pain.  Reportedly has known hiatal hernia.  Now pain in upper abdomen/epigastric area.  Began acutely.  No nausea.  No vomiting.  No fevers.      Past Medical History:  Diagnosis Date   Acute blood loss anemia 08/16/2021   Allergy    hay fever;  seasonal   Arthritis    right ankle   Cataract    surgery 2016   Chronic kidney disease    stones many years ago   Constipation 12/09/2018   Dizziness 12/13/2015   Dysphagia    Family history of malignant neoplasm of prostate 12/12/2017   Frequent urination at night    GERD (gastroesophageal reflux disease)    History of arthroplasty of right ankle 02/04/2020   History of kidney stones    History of prostate cancer 06/16/2017   HTN (hypertension)    Hyperlipidemia LDL goal <100 10/26/2019   Hypertension    Phreesia 04/24/2020   Hypothyroidism    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 on Tikosyn and Eliquis b. s/p ablation in 10/2019 due to break-through episodes while on Tikosyn.    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 Rosana Hoes   Schatzki's ring 10/22/2011   Seasonal allergies    Syncope  dx. 8 yrs ago   Syncope, cardiogenic 03/20/2017   Vaccine counseling 09/15/2019    Home Medications Prior to Admission medications   Medication Sig Start Date End Date Taking? Authorizing Provider  HYDROcodone-acetaminophen (NORCO/VICODIN) 5-325 MG tablet Take 1-2 tablets by mouth every 6 (six) hours as needed. 04/28/22  Yes Davonna Belling, MD  polyethylene glycol (MIRALAX / GLYCOLAX) 17 g packet Take 17 g by mouth daily as needed (while on pain  meds). 04/28/22  Yes Davonna Belling, MD  diphenhydramine-acetaminophen (TYLENOL PM) 25-500 MG TABS tablet Take 1 tablet by mouth at bedtime as needed (sleep and mild discomfort).     [provider]  dofetilide (TIKOSYN) 500 MCG capsule Take 1 capsule (500 mcg total) by mouth 2 (two) times daily. 06/05/21   Camnitz, Will Hassell Done, MD  ELIQUIS 5 MG TABS tablet TAKE 1 TABLET BY MOUTH TWICE  DAILY 01/21/22   Camnitz, Ocie Doyne, MD  ferrous sulfate 325 (65 FE) MG tablet Take 1 tablet (325 mg total) by mouth daily with breakfast. 08/19/21   Tat, Shanon Brow, MD  finasteride (PROSCAR) 5 MG tablet Take 1 tablet by mouth daily. 05/25/20   [provider]  fluticasone (FLONASE) 50 MCG/ACT nasal spray USE 1 SPRAY IN BOTH  NOSTRILS DAILY AS NEEDED  FOR ALLERGIES 11/06/21   Lindell Spar, MD  gabapentin (NEURONTIN) 100 MG capsule Take 200 mg by mouth 2 (two) times daily.    [provider]  levothyroxine (SYNTHROID) 75 MCG tablet Take 1 tablet (75 mcg total) by mouth daily. 08/23/21   Lindell Spar, MD  loratadine (CLARITIN) 10 MG tablet Take 10 mg by mouth daily as needed for allergies.    [provider]  losartan (COZAAR) 50 MG tablet TAKE 1 TABLET BY  MOUTH DAILY 02/26/22   Camnitz, Ocie Doyne, MD  Omega-3 Fatty Acids (RA FISH OIL) 1400 MG CPDR Take 1,400 mg by mouth 2 (two) times daily.    [provider]  ondansetron (ZOFRAN-ODT) 8 MG disintegrating tablet Take 1 tablet (8 mg total) by mouth every 8 (eight) hours as needed for nausea or vomiting. 04/28/22   Davonna Belling, MD  pantoprazole (PROTONIX) 40 MG tablet TAKE 1 TABLET BY MOUTH  DAILY BEFORE BREAKFAST 08/25/21   Mahala Menghini, PA-C  Polyethyl Glycol-Propyl Glycol 0.4-0.3 % SOLN Place 1 drop into both eyes 2 (two) times daily as needed (dry eyes).     [provider]  Tamsulosin HCl (FLOMAX) 0.4 MG CAPS Take 0.4 mg by mouth daily after breakfast.     [provider]  vitamin B-12  (CYANOCOBALAMIN) 1000 MCG tablet Take 1,000 mcg by mouth 3 (three) times a week.    [provider]      Allergies    Tetanus toxoids, Statins, Codeine, Dicyclomine, Sulfamethoxazole, Doxycycline, Oxycodone, Penicillins, Sulfa antibiotics, and Xarelto [rivaroxaban]    Review of Systems   Review of Systems  Gastrointestinal:  Positive for abdominal pain.    Physical Exam Updated Vital Signs BP (!) 177/98   Pulse 67   Temp 97.9 F (36.6 C) (Oral)   Resp 17   Ht '5\' 10"'$  (1.778 m)   Wt 87.5 kg   SpO2 100%   BMI 27.69 kg/m  Physical Exam Nursing note reviewed.  HENT:     Head: Atraumatic.  Cardiovascular:     Rate and Rhythm: Regular rhythm.  Pulmonary:     Breath sounds: Normal breath sounds.  Abdominal:     Tenderness: There is abdominal tenderness.     Comments: Epigastric tenderness without rebound or guarding.  No hernias palpated.  Skin:    General: Skin is warm.  Neurological:     Mental Status: He is alert and oriented to person, place, and time.     ED Results / Procedures / Treatments   Labs (all labs ordered are listed, but only abnormal results are displayed) Labs Reviewed  COMPREHENSIVE METABOLIC PANEL - Abnormal; Notable for the following components:      Result Value   Glucose, Bld 121 (*)    Creatinine, Ser 1.26 (*)    Total Protein 6.3 (*)    GFR, Estimated 57 (*)    All other components within normal limits  CBC WITH DIFFERENTIAL/PLATELET - Abnormal; Notable for the following components:   Neutro Abs 8.1 (*)    All other components within normal limits  LIPASE, BLOOD - Abnormal; Notable for the following components:   Lipase 5,004 (*)    All other components within normal limits  TROPONIN I (HIGH SENSITIVITY)  TROPONIN I (HIGH SENSITIVITY)    EKG EKG Interpretation  Date/Time:  Sunday April 28 2022 10:38:25 EDT Ventricular Rate:  66 PR Interval:  235 QRS Duration: 147 QT Interval:  466 QTC Calculation: 489 R  Axis:   -36 Text Interpretation: Sinus or ectopic atrial rhythm Prolonged PR interval Right bundle branch block Confirmed by Davonna Belling [phone]) on 04/28/2022 10:51:26 AM  Radiology CT ABDOMEN PELVIS W CONTRAST  Result Date: 04/28/2022 CLINICAL DATA:  Acute onset of left upper quadrant pain this morning. EXAM: CT ABDOMEN AND PELVIS WITH CONTRAST TECHNIQUE: Multidetector CT imaging of the abdomen and pelvis was performed using the standard protocol following bolus administration of intravenous contrast. RADIATION DOSE REDUCTION: This exam was performed according  to the departmental dose-optimization program which includes automated exposure control, adjustment of the mA and/or kV according to patient size and/or use of iterative reconstruction technique. CONTRAST:  116m OMNIPAQUE IOHEXOL 300 MG/ML  SOLN COMPARISON:  None Available. FINDINGS: Lower Chest: No acute findings. Hepatobiliary: No hepatic masses identified. Several tiny cysts are noted in the left hepatic lobe. Prior cholecystectomy. No evidence of biliary obstruction. Pancreas: Pancreatic edema is seen with mild-to-moderate peripancreatic inflammatory changes and fluid, consistent with acute pancreatitis. No evidence of pancreatic necrosis or pseudocyst. No evidence of pancreatic mass or ductal dilatation. Spleen: Within normal limits in size and appearance. Adrenals/Urinary Tract: No suspicious masses identified. Benign-appearing renal cysts are noted bilaterally (no followup imaging is recommended). 3 mm calculus noted in lower pole of left kidney. A 1 mm calculus is also noted in the urinary bladder. No evidence of ureteral calculi or hydronephrosis. Mild diffuse bladder wall thickening may be due to cystitis or chronic bladder outlet obstruction. Stomach/Bowel: Moderate to large hiatal hernia is seen. No evidence of obstruction, inflammatory process or abnormal fluid collections. Diverticulosis is seen involving the cecum and sigmoid colon,  however there is no evidence of diverticulitis. Vascular/Lymphatic: No pathologically enlarged lymph nodes. No acute vascular findings. Aortic atherosclerotic calcification incidentally noted. Reproductive:  No mass or other significant abnormality. Other: Fluid collection is partially visualized in the upper left scrotum, measuring at least 6 cm in diameter. This likely represents a hydrocele or epididymal cyst. Musculoskeletal:  No suspicious bone lesions identified. IMPRESSION: Moderate acute pancreatitis. No evidence of pancreatic necrosis or pseudocysts. Moderate to large hiatal hernia. Colonic diverticulosis, without radiographic evidence of diverticulitis. Tiny nonobstructing left renal and urinary bladder calculi. No evidence of ureteral calculi or hydronephrosis. Diffuse bladder wall thickening, which may be due to cystitis or chronic bladder outlet obstruction. Partially visualized fluid collection in upper left scrotum, likely representing a hydrocele or epididymal cyst. Consider scrotal ultrasound for further evaluation. Aortic Atherosclerosis (ICD10-I70.0). Electronically Signed   By: JMarlaine HindM.D.   On: 04/28/2022 13:24   DG Abdomen Acute W/Chest  Result Date: 04/28/2022 CLINICAL DATA:  Abdominal pain. EXAM: DG ABDOMEN ACUTE WITH 1 VIEW CHEST COMPARISON:  August 16, 2021 FINDINGS: A hiatal hernia is identified. The heart, hila, mediastinum, lungs, and pleura are stable. No free air, portal venous gas, or pneumatosis. Cholecystectomy clips are identified in the right upper quadrant. Vague hyperdensities projected over the renal shadows are suspected to represent small stones versus bowel contents. Phleboliths are identified in the pelvis. No ureteral stones noted. Moderate fecal loading throughout the colon.  No bowel obstruction. No other abnormalities. IMPRESSION: 1. Suspected renal stones. 2. Moderate fecal loading throughout the colon. 3. Hiatal hernia. 4. No other acute abnormalities.  Electronically Signed   By: DDorise BullionIII M.D.   On: 04/28/2022 11:04    Procedures Procedures    Medications Ordered in ED Medications  pantoprazole (PROTONIX) injection 40 mg (40 mg Intravenous Given 04/28/22 1250)  iohexol (OMNIPAQUE) 300 MG/ML solution 100 mL (100 mLs Intravenous Contrast Given 04/28/22 1259)    ED Course/ Medical Decision Making/ A&P                           Medical Decision Making Amount and/or Complexity of Data Reviewed Labs: ordered. Radiology: ordered.  Risk Prescription drug management.  Patient with epigastric pain and tenderness.  Began somewhat acutely.  Mild radiation to chest.  EKG reassuring.  Is on anticoagulation.  Differential diagnosis includes pain from his hiatal hernia, bleeding ulcer, not differential abdominal pain.  We will get x-ray and some basic blood work.  X-ray reassuring overall.  However lipase was elevated consistent with acute pancreatitis.  Normal LFTs and has had previous cholecystectomy.  CT scan done and showed acute pancreatitis.  Lab work otherwise reassuring.  Patient is actually eager to go home.  We will treat with pain meds and antiemetics and outpatient trial with his gastroenterologist.  If unable to manage the pain will return.  Will discharge home        Final Clinical Impression(s) / ED Diagnoses Final diagnoses:  Acute pancreatitis, unspecified complication status, unspecified pancreatitis type    Rx / DC Orders ED Discharge Orders          Ordered    ondansetron (ZOFRAN-ODT) 8 MG disintegrating tablet  Every 8 hours PRN,   Status:  Discontinued        04/28/22 1346    HYDROcodone-acetaminophen (NORCO/VICODIN) 5-325 MG tablet  Every 6 hours PRN        04/28/22 1346    polyethylene glycol (MIRALAX / GLYCOLAX) 17 g packet  Daily PRN        04/28/22 1346    ondansetron (ZOFRAN-ODT) 8 MG disintegrating tablet  Every 8 hours PRN        04/28/22 1348              Davonna Belling,  MD 04/28/22 1441

## 2022-04-28 NOTE — ED Triage Notes (Signed)
Pt c/o abd pain since this morning. Pt has a hernia at exact place of pain.  No n/v noted.

## 2022-04-30 ENCOUNTER — Telehealth: Payer: Self-pay

## 2022-04-30 ENCOUNTER — Telehealth: Payer: Self-pay | Admitting: Gastroenterology

## 2022-04-30 NOTE — Telephone Encounter (Signed)
Wife has called and said that patient was seen in the ED on Sunday and was diagnosed with acute pancreatitis.  Was told to make a followup appt in the office and she said he wasn't doing that good.  He is constipated, not sleeping.  The earliest appointment I could offer was Oct. 10 but she feels he needs to be seen before then.   Please advise.

## 2022-04-30 NOTE — Telephone Encounter (Signed)
Wife called stating that pt was seen in the ed for acute pancreatitis and is needing an er f/u this week. Please reach out to wife to schedule appt for first available. Thanks.

## 2022-04-30 NOTE — Telephone Encounter (Signed)
If she is only wanting him to see Dr. Gala Romney there is nothing sooner, but if anyone is ok with her you can ask Cyril Mourning about her urgent slot on Thursday or one of her virtual office visit slots, but he will need to be seen in office.

## 2022-05-05 NOTE — Progress Notes (Unsigned)
GI Office Note    Referring Provider: Lindell Spar, MD Primary Care Physician:  Lindell Spar, MD  Primary Gastroenterologist: Garfield Cornea, MD   Chief Complaint   No chief complaint on file.   History of Present Illness   Brent Wilkins is a 81 y.o. male presenting today for ED follow up. Patient last seen in 09/2021. He has history of IDA, hospitalized in 08/2021.   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  It was felt that he may need repeat EGD in 4 to 6 weeks for repeat treatment.     Remote colonoscopy in 2011 was normal except for scattered pancolonic diverticula.   CT A/P with contrast 04/2022:  IMPRESSION: -Moderate acute pancreatitis. No evidence of pancreatic necrosis or pseudocysts. -Moderate to large hiatal hernia. -Colonic diverticulosis, without radiographic evidence of diverticulitis. -Tiny nonobstructing left renal and urinary bladder calculi. No evidence of ureteral calculi or hydronephrosis. -Diffuse bladder wall thickening, which may be due to cystitis or chronic bladder outlet obstruction. -Partially visualized fluid collection in upper left scrotum, likely representing a hydrocele or epididymal cyst. Consider scrotal ultrasound for further evaluation. -Aortic Atherosclerosis (ICD10-I70.0).      Medications   Current Outpatient Medications  Medication Sig Dispense Refill   diphenhydramine-acetaminophen (TYLENOL PM) 25-500 MG TABS tablet Take 1 tablet by mouth at bedtime as needed (sleep and mild discomfort).      dofetilide (TIKOSYN) 500 MCG capsule Take 1 capsule (500 mcg total) by mouth 2 (two) times daily. 180 capsule 2   ELIQUIS 5 MG TABS tablet TAKE 1 TABLET BY MOUTH TWICE  DAILY 200 tablet 2   ferrous sulfate 325 (65 FE) MG tablet Take 1 tablet (325 mg total) by mouth daily with breakfast.  3   finasteride (PROSCAR) 5 MG tablet Take 1  tablet by mouth daily.     fluticasone (FLONASE) 50 MCG/ACT nasal spray USE 1 SPRAY IN BOTH  NOSTRILS DAILY AS NEEDED  FOR ALLERGIES 32 g 2   gabapentin (NEURONTIN) 100 MG capsule Take 200 mg by mouth 2 (two) times daily.     HYDROcodone-acetaminophen (NORCO/VICODIN) 5-325 MG tablet Take 1-2 tablets by mouth every 6 (six) hours as needed. 8 tablet 0   levothyroxine (SYNTHROID) 75 MCG tablet Take 1 tablet (75 mcg total) by mouth daily. 90 tablet 3   loratadine (CLARITIN) 10 MG tablet Take 10 mg by mouth daily as needed for allergies.     losartan (COZAAR) 50 MG tablet TAKE 1 TABLET BY MOUTH DAILY 100 tablet 1   Omega-3 Fatty Acids (RA FISH OIL) 1400 MG CPDR Take 1,400 mg by mouth 2 (two) times daily.     ondansetron (ZOFRAN-ODT) 8 MG disintegrating tablet Take 1 tablet (8 mg total) by mouth every 8 (eight) hours as needed for nausea or vomiting. 8 tablet 0   pantoprazole (PROTONIX) 40 MG tablet TAKE 1 TABLET BY MOUTH  DAILY BEFORE BREAKFAST 90 tablet 3   Polyethyl Glycol-Propyl Glycol 0.4-0.3 % SOLN Place 1 drop into both eyes 2 (two) times daily as needed (dry eyes).      polyethylene glycol (MIRALAX / GLYCOLAX) 17 g packet Take 17 g by mouth daily as needed (while on pain meds). 14 each 0   Tamsulosin HCl (FLOMAX) 0.4 MG CAPS Take 0.4 mg by mouth daily after breakfast.      vitamin B-12 (CYANOCOBALAMIN) 1000 MCG  tablet Take 1,000 mcg by mouth 3 (three) times a week.     No current facility-administered medications for this visit.    Allergies   Allergies as of 05/06/2022 - Review Complete 04/28/2022  Allergen Reaction Noted   Tetanus toxoids Swelling 05/10/2013   Statins Other (See Comments) 10/12/2012   Codeine  04/28/2022   Dicyclomine Other (See Comments) 12/21/2018   Sulfamethoxazole  09/26/2011   Doxycycline Rash 09/30/2011   Oxycodone Other (See Comments) 07/06/2014   Penicillins Swelling and Rash 09/26/2011   Sulfa antibiotics Swelling and Rash 10/22/2011   Xarelto  [rivaroxaban] Rash 06/16/2017     Past Medical History   Past Medical History:  Diagnosis Date   Acute blood loss anemia 08/16/2021   Allergy    hay fever;  seasonal   Arthritis    right ankle   Cataract    surgery 2016   Chronic kidney disease    stones many years ago   Constipation 12/09/2018   Dizziness 12/13/2015   Dysphagia    Family history of malignant neoplasm of prostate 12/12/2017   Frequent urination at night    GERD (gastroesophageal reflux disease)    History of arthroplasty of right ankle 02/04/2020   History of kidney stones    History of prostate cancer 06/16/2017   HTN (hypertension)    Hyperlipidemia LDL goal <100 10/26/2019   Hypertension    Phreesia 04/24/2020   Hypothyroidism    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 on Tikosyn and Eliquis b. s/p ablation in 10/2019 due to break-through episodes while on Tikosyn.    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 Rosana Hoes   Schatzki's ring 10/22/2011   Seasonal allergies    Syncope  dx. 8 yrs ago   Syncope, cardiogenic 03/20/2017   Vaccine counseling 09/15/2019    Past Surgical History   Past Surgical History:  Procedure Laterality Date   APPENDECTOMY     ATRIAL FIBRILLATION ABLATION N/A 10/06/2019   Procedure: ATRIAL FIBRILLATION ABLATION;  Surgeon: Constance Haw, MD;  Location: [place] [facility];  Service: Cardiovascular;  Laterality: N/A;   CATARACT EXTRACTION W/PHACO Left 03/27/2015   Procedure: CATARACT EXTRACTION PHACO AND INTRAOCULAR LENS PLACEMENT LEFT EYE CDE=11.73;  Surgeon: Tonny Branch, MD;  Location: [facility];  Service: Ophthalmology;  Laterality: Left;   CATARACT EXTRACTION W/PHACO Right 03/30/2015   Procedure: CATARACT EXTRACTION PHACO AND INTRAOCULAR LENS PLACEMENT RIGHT EYE CDE=7.35;  Surgeon: Tonny Branch, MD;  Location: [facility];  Service: Ophthalmology;   Laterality: Right;   CHOLECYSTECTOMY     COLONOSCOPY  07/23/2010   Dr. Vivi Ferns rectum, scattered pancolonic diverticula   COLONOSCOPY  08/10/1999   internal hemorrhoids,inflammatory polyp   ESOPHAGOGASTRODUODENOSCOPY  04/12/2008   Prominent Schatzki's ring, erosive reflux esophagitis, multiple antral erosions, small hiatal hernia, reactive gastropathy, status post dilation with 90F   ESOPHAGOGASTRODUODENOSCOPY  10/28/2011   Procedure: ESOPHAGOGASTRODUODENOSCOPY (EGD);  Surgeon: Daneil Dolin, MD;  Location: [facility];  Service: Endoscopy;  Laterality: N/A;  1:30   ESOPHAGOGASTRODUODENOSCOPY N/A 09/10/2017   Dr. Gala Romney: Schatkzi's ring s/p dilation at GE junction, mild esophageal reflux esophagitis, medium sized hiatal hernia, normal duodenum. 53 and 66 Fr dilation   ESOPHAGOGASTRODUODENOSCOPY (EGD) WITH PROPOFOL N/A 08/17/2021   Moderate sized hiatal hernia, nodular GAVE 1 spot actively oozing status post APC.  Subsequent APC of remainder GAVE.   EYE SURGERY     HOT HEMOSTASIS  08/17/2021   Procedure: HOT HEMOSTASIS (ARGON PLASMA COAGULATION/BICAP);  Surgeon: Eloise Harman, DO;  [facility];  Service: Endoscopy;;   JOINT REPLACEMENT N/A    Phreesia 01/15/2020   MALONEY DILATION  10/28/2011   Procedure: Venia Minks DILATION;  Surgeon: Daneil Dolin, MD;  [facility];  Service: Endoscopy;  Laterality: N/A;   MALONEY DILATION N/A 09/10/2017   Procedure: Venia Minks DILATION;  Surgeon: Daneil Dolin, MD;  [facility];  Service: Endoscopy;  Laterality: N/A;   SAVORY DILATION  10/28/2011   Procedure: SAVORY DILATION;  Surgeon: Daneil Dolin, MD;  [facility];  Service: Endoscopy;  Laterality: N/A;   TONSILLECTOMY     TOTAL ANKLE ARTHROPLASTY Left 11/25/2013   Procedure: TOTAL ANKLE ARTHOPLASTY;  Surgeon: Wylene Simmer, MD;  Location: [place];  Service: Orthopedics;  Laterality: Left;   TOTAL ANKLE ARTHROPLASTY Right 07/14/2014   dr hewitt    TOTAL ANKLE ARTHROPLASTY Right 07/14/2014   Procedure: RIGHT TOTAL ANKLE ARTHOPLASTY;  Surgeon: Wylene Simmer, MD;  Location: [place];  Service: Orthopedics;  Laterality: Right;    Past Family History   Family History  Problem Relation Age of Onset   Prostate cancer Father    Cancer Father        prostate to bone   Hypertension Father    Prostate cancer Son 55   ALS Son    Prostate cancer Brother    Heart disease Mother        died at 49   Arthritis Mother    Colon cancer Neg Hx     Past Social History   Social History   Socioeconomic History   Marital status: Married    Spouse name: Candace   Number of children: 2   Years of education: Not on file   Highest education level: Bachelor's degree (e.g., BA, AB, BS)  Occupational History   Occupation: retired; Ambulance person: RETIRED  Tobacco Use   Smoking status: Never   Smokeless tobacco: Never  Vaping Use   Vaping Use: Never used  Substance and Sexual Activity   Alcohol use: Yes    Alcohol/week: 2.0 standard drinks of alcohol    Types: 1 Glasses of wine, 1 Cans of beer per week    Comment: glass of wine or "couple of beers" occasionally    Drug use: No   Sexual activity: Yes    Partners: Female  Other Topics Concern   Not on file  Social History Narrative   Lives with wife Candace.       Moved from [place], [place]   Retired from [employer].   Plays golf.   Works with [employer] and [employer] for [employer].   Wears seatbelt.   Drives.   Eats all food groups.   Never smoked.   Married for over 13 years June  (2020)   Has two grown children, live in [place] and [place].   Social Determinants of Health   Financial Resource Strain: Low Risk  (02/21/2022)   Overall Financial Resource Strain (CARDIA)    Difficulty of Paying Living Expenses: Not hard at all  Food Insecurity: No Food Insecurity (02/21/2022)   Hunger Vital Sign    Worried About Running Out of Food in the Last Year: Never true    Ran  Out of Food in the Last Year:  Never true  Transportation Needs: No Transportation Needs (02/21/2022)   PRAPARE - Hydrologist (Medical): No    Lack of Transportation (Non-Medical): No  Physical Activity: Sufficiently Active (02/21/2022)   Exercise Vital Sign    Days of Exercise per Week: 7 days    Minutes of Exercise per Session: 100 min  Stress: No Stress Concern Present (02/21/2022)   [place]    Feeling of Stress : Not at all  Social Connections: [place] (02/21/2022)   Social Connection and Isolation Panel [NHANES]    Frequency of Communication with Friends and Family: More than three times a week    Frequency of Social Gatherings with Friends and Family: Three times a week    Attends Religious Services: More than 4 times per year    Active Member of Clubs or Organizations: Yes    Attends Archivist Meetings: More than 4 times per year    Marital Status: Married  Human resources officer Violence: Not At Risk (02/21/2022)   Humiliation, Afraid, Rape, and Kick questionnaire    Fear of Current or Ex-Partner: No    Emotionally Abused: No    Physically Abused: No    Sexually Abused: No    Review of Systems   General: Negative for anorexia, weight loss, fever, chills, fatigue, weakness. ENT: Negative for hoarseness, difficulty swallowing , nasal congestion. CV: Negative for chest pain, angina, palpitations, dyspnea on exertion, peripheral edema.  Respiratory: Negative for dyspnea at rest, dyspnea on exertion, cough, sputum, wheezing.  GI: See history of present illness. GU:  Negative for dysuria, hematuria, urinary incontinence, urinary frequency, nocturnal urination.  Endo: Negative for unusual weight change.     Physical Exam   There were no vitals taken for this visit.   General: Well-nourished, well-developed in no acute distress.  Eyes: No icterus. Mouth:  Oropharyngeal mucosa moist and pink , no lesions erythema or exudate. Lungs: Clear to auscultation bilaterally.  Heart: Regular rate and rhythm, no murmurs rubs or gallops.  Abdomen: Bowel sounds are normal, nontender, nondistended, no hepatosplenomegaly or masses,  no abdominal bruits or hernia , no rebound or guarding.  Rectal: ***  Extremities: No lower extremity edema. No clubbing or deformities. Neuro: Alert and oriented x 4   Skin: Warm and dry, no jaundice.   Psych: Alert and cooperative, normal mood and affect.  Labs   Lab Results  Component Value Date   CREATININE 1.26 (H) 04/28/2022   BUN 18 04/28/2022   NA 144 04/28/2022   K 4.5 04/28/2022   CL 110 04/28/2022   CO2 27 04/28/2022   Lab Results  Component Value Date   ALT 11 04/28/2022   AST 21 04/28/2022   ALKPHOS 52 04/28/2022   BILITOT 0.7 04/28/2022   Lab Results  Component Value Date   WBC 10.5 04/28/2022   HGB 14.5 04/28/2022   HCT 43.0 04/28/2022   MCV 95.6 04/28/2022   PLT 198 04/28/2022   Lab Results  Component Value Date   LIPASE 5,004 (H) 04/28/2022   Lab Results  Component Value Date   CKTOTAL 71 03/20/2017   TROPONINI <0.03 12/07/2018   Lab Results  Component Value Date   IRON 12 (L) 08/16/2021   TIBC 405 08/16/2021   FERRITIN 60 02/11/2022   Lab Results  Component Value Date   TSH 3.620 02/11/2022   Lab Results  Component Value Date   HGBA1C 5.4 02/11/2022  Imaging Studies   CT ABDOMEN PELVIS W CONTRAST  Result Date: 04/28/2022 CLINICAL DATA:  Acute onset of left upper quadrant pain this morning. EXAM: CT ABDOMEN AND PELVIS WITH CONTRAST TECHNIQUE: Multidetector CT imaging of the abdomen and pelvis was performed using the standard protocol following bolus administration of intravenous contrast. RADIATION DOSE REDUCTION: This exam was performed according to the departmental dose-optimization program which includes automated exposure control, adjustment of the mA and/or kV  according to patient size and/or use of iterative reconstruction technique. CONTRAST:  140m OMNIPAQUE IOHEXOL 300 MG/ML  SOLN COMPARISON:  None Available. FINDINGS: Lower Chest: No acute findings. Hepatobiliary: No hepatic masses identified. Several tiny cysts are noted in the left hepatic lobe. Prior cholecystectomy. No evidence of biliary obstruction. Pancreas: Pancreatic edema is seen with mild-to-moderate peripancreatic inflammatory changes and fluid, consistent with acute pancreatitis. No evidence of pancreatic necrosis or pseudocyst. No evidence of pancreatic mass or ductal dilatation. Spleen: Within normal limits in size and appearance. Adrenals/Urinary Tract: No suspicious masses identified. Benign-appearing renal cysts are noted bilaterally (no followup imaging is recommended). 3 mm calculus noted in lower pole of left kidney. A 1 mm calculus is also noted in the urinary bladder. No evidence of ureteral calculi or hydronephrosis. Mild diffuse bladder wall thickening may be due to cystitis or chronic bladder outlet obstruction. Stomach/Bowel: Moderate to large hiatal hernia is seen. No evidence of obstruction, inflammatory process or abnormal fluid collections. Diverticulosis is seen involving the cecum and sigmoid colon, however there is no evidence of diverticulitis. Vascular/Lymphatic: No pathologically enlarged lymph nodes. No acute vascular findings. Aortic atherosclerotic calcification incidentally noted. Reproductive:  No mass or other significant abnormality. Other: Fluid collection is partially visualized in the upper left scrotum, measuring at least 6 cm in diameter. This likely represents a hydrocele or epididymal cyst. Musculoskeletal:  No suspicious bone lesions identified. IMPRESSION: Moderate acute pancreatitis. No evidence of pancreatic necrosis or pseudocysts. Moderate to large hiatal hernia. Colonic diverticulosis, without radiographic evidence of diverticulitis. Tiny nonobstructing left  renal and urinary bladder calculi. No evidence of ureteral calculi or hydronephrosis. Diffuse bladder wall thickening, which may be due to cystitis or chronic bladder outlet obstruction. Partially visualized fluid collection in upper left scrotum, likely representing a hydrocele or epididymal cyst. Consider scrotal ultrasound for further evaluation. Aortic Atherosclerosis (ICD10-I70.0). Electronically Signed   By: JMarlaine HindM.D.   On: 04/28/2022 13:24   DG Abdomen Acute W/Chest  Result Date: 04/28/2022 CLINICAL DATA:  Abdominal pain. EXAM: DG ABDOMEN ACUTE WITH 1 VIEW CHEST COMPARISON:  August 16, 2021 FINDINGS: A hiatal hernia is identified. The heart, hila, mediastinum, lungs, and pleura are stable. No free air, portal venous gas, or pneumatosis. Cholecystectomy clips are identified in the right upper quadrant. Vague hyperdensities projected over the renal shadows are suspected to represent small stones versus bowel contents. Phleboliths are identified in the pelvis. No ureteral stones noted. Moderate fecal loading throughout the colon.  No bowel obstruction. No other abnormalities. IMPRESSION: 1. Suspected renal stones. 2. Moderate fecal loading throughout the colon. 3. Hiatal hernia. 4. No other acute abnormalities. Electronically Signed   By: DDorise BullionIII M.D.   On: 04/28/2022 11:04    Assessment       PLAN   ***   LLaureen [facility]chs [facility]

## 2022-05-06 ENCOUNTER — Encounter: Payer: Self-pay | Admitting: Gastroenterology

## 2022-05-06 ENCOUNTER — Encounter: Payer: Self-pay | Admitting: *Deleted

## 2022-05-06 ENCOUNTER — Telehealth: Payer: Self-pay | Admitting: Gastroenterology

## 2022-05-06 ENCOUNTER — Ambulatory Visit: Payer: Medicare Other | Admitting: Gastroenterology

## 2022-05-06 VITALS — BP 158/85 | HR 84 | Temp 97.6°F | Ht 70.0 in | Wt 196.0 lb

## 2022-05-06 DIAGNOSIS — D500 Iron deficiency anemia secondary to blood loss (chronic): Secondary | ICD-10-CM

## 2022-05-06 DIAGNOSIS — K219 Gastro-esophageal reflux disease without esophagitis: Secondary | ICD-10-CM

## 2022-05-06 DIAGNOSIS — K449 Diaphragmatic hernia without obstruction or gangrene: Secondary | ICD-10-CM | POA: Diagnosis not present

## 2022-05-06 DIAGNOSIS — K859 Acute pancreatitis without necrosis or infection, unspecified: Secondary | ICD-10-CM | POA: Insufficient documentation

## 2022-05-06 DIAGNOSIS — K8500 Idiopathic acute pancreatitis without necrosis or infection: Secondary | ICD-10-CM

## 2022-05-06 NOTE — Patient Instructions (Signed)
We will plan for MRI of your pancreas in 6-8 weeks. Two days before your MRI, please complete labs at [facility]. Orders provided today. Continue pantoprazole daily. You can hold iron for now.  Call if you have recurrent abdominal pain.

## 2022-05-06 NOTE — Telephone Encounter (Signed)
Please let patient know that I forgot to address this while he was in the office.  His CT scan from the ED also showed partial view of the upper left scrotum, fluid collection seen which could be a hydrocele or epididymal cyst but scrotal ultrasound being advised by the radiologist to further evaluate.  I would like for him to follow-up with either his PCP or if he has a urologist, for this finding.

## 2022-05-07 ENCOUNTER — Ambulatory Visit: Payer: Medicare Other | Admitting: Gastroenterology

## 2022-05-07 NOTE — Telephone Encounter (Signed)
Lmom for pt to return my call.  

## 2022-05-07 NOTE — Telephone Encounter (Signed)
Pt states that he has been dealing with prostate cancer issues for a while now and is due to see his urologist in 3 to 4 weeks and thanks you for checking up on it.

## 2022-05-09 ENCOUNTER — Ambulatory Visit: Payer: Medicare Other | Admitting: Gastroenterology

## 2022-05-09 ENCOUNTER — Other Ambulatory Visit: Payer: Self-pay

## 2022-05-09 MED ORDER — DOFETILIDE 500 MCG PO CAPS: 500.0000 ug | ORAL_CAPSULE | Freq: Two times a day (BID) | ORAL | 2 refills | Status: DC

## 2022-05-09 NOTE — Telephone Encounter (Signed)
Pt's medication was sent to pt's pharmacy as requested. Confirmation received.  °

## 2022-05-13 ENCOUNTER — Ambulatory Visit: Payer: Medicare Other | Admitting: Internal Medicine

## 2022-05-14 ENCOUNTER — Ambulatory Visit: Payer: Medicare Other | Admitting: Internal Medicine

## 2022-05-30 ENCOUNTER — Encounter: Payer: Self-pay | Admitting: Internal Medicine

## 2022-05-30 ENCOUNTER — Ambulatory Visit (INDEPENDENT_AMBULATORY_CARE_PROVIDER_SITE_OTHER): Payer: Medicare Other | Admitting: Internal Medicine

## 2022-05-30 VITALS — BP 132/88 | HR 70 | Resp 18 | Ht 70.0 in | Wt 198.0 lb

## 2022-05-30 DIAGNOSIS — Z7901 Long term (current) use of anticoagulants: Secondary | ICD-10-CM

## 2022-05-30 DIAGNOSIS — E785 Hyperlipidemia, unspecified: Secondary | ICD-10-CM | POA: Diagnosis not present

## 2022-05-30 DIAGNOSIS — I10 Essential (primary) hypertension: Secondary | ICD-10-CM

## 2022-05-30 DIAGNOSIS — E039 Hypothyroidism, unspecified: Secondary | ICD-10-CM

## 2022-05-30 DIAGNOSIS — Z8719 Personal history of other diseases of the digestive system: Secondary | ICD-10-CM | POA: Diagnosis not present

## 2022-05-30 DIAGNOSIS — E559 Vitamin D deficiency, unspecified: Secondary | ICD-10-CM

## 2022-05-30 DIAGNOSIS — I4811 Longstanding persistent atrial fibrillation: Secondary | ICD-10-CM

## 2022-05-30 DIAGNOSIS — N1831 Chronic kidney disease, stage 3a: Secondary | ICD-10-CM

## 2022-05-30 DIAGNOSIS — N5089 Other specified disorders of the male genital organs: Secondary | ICD-10-CM

## 2022-05-30 NOTE — Assessment & Plan Note (Signed)
Lab Results  Component Value Date   TSH 3.620 02/11/2022   On levothyroxine Check TSH and free T4

## 2022-05-30 NOTE — Assessment & Plan Note (Signed)
Recently ER visit for acute pancreatitis Unclear etiology No history of alcohol abuse, s/p cholecystectomy and TG wnl Followed by GI

## 2022-05-30 NOTE — Assessment & Plan Note (Signed)
Noted on CT abdomen Asymptomatic Patient has had similar finding for a long time - followed by Urology

## 2022-05-30 NOTE — Assessment & Plan Note (Signed)
On dofetilide and Eliquis Currently in sinus rhythm Followed by Cardiology

## 2022-05-30 NOTE — Progress Notes (Signed)
Established Patient Office Visit  Subjective:  Patient ID: Brent Wilkins, male    DOB: 13-Feb-1941  Age: 81 y.o. MRN: [id number]  CC:  Chief Complaint  Patient presents with   Follow-up    Follow up afib and hypothyroidism     HPI Brent Wilkins is a 81 y.o. male with past medical history of A. fib, HTN, hypothyroidism, and IDA who presents for f/u of his chronic medical conditions.  He had episode of acute pancreatitis about a month ago.  He had severe epigastric pain, for which he went to ER.  He had CT abdomen done, which showed acute pancreatitis.  His lipase was also elevated.  He does not have history of alcohol abuse.  Has had cholecystectomy.  His TG level has been WNL.  He denies any recurrence of epigastric pain.  He is followed by GI currently.  HTN and A Fib: BP is well-controlled for his age. Takes medications regularly. Patient denies headache, dizziness, chest pain, dyspnea or palpitations. He has been taking Eliquis now, denies any active signs of bleeding.   IDA: Hb improved to 14.5 now, continues to take oral iron supplements. Denies fatigue, able to walk without dyspnea now.     Past Medical History:  Diagnosis Date   Acute blood loss anemia 08/16/2021   Allergy    hay fever;  seasonal   Arthritis    right ankle   Cataract    surgery 2016   Chronic kidney disease    stones many years ago   Constipation 12/09/2018   Dizziness 12/13/2015   Dysphagia    Family history of malignant neoplasm of prostate 12/12/2017   Frequent urination at night    GERD (gastroesophageal reflux disease)    History of arthroplasty of right ankle 02/04/2020   History of kidney stones    History of prostate cancer 06/16/2017   HTN (hypertension)    Hyperlipidemia LDL goal <100 10/26/2019   Hypertension    Phreesia 04/24/2020   Hypothyroidism    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 on Tikosyn and Eliquis  b. s/p ablation in 10/2019 due to break-through episodes while on Tikosyn.    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 Earlene Plater   Schatzki's ring 10/22/2011   Seasonal allergies    Syncope  dx. 8 yrs ago   Syncope, cardiogenic 03/20/2017   Vaccine counseling 09/15/2019    Past Surgical History:  Procedure Laterality Date   APPENDECTOMY     ATRIAL FIBRILLATION ABLATION N/A 10/06/2019   Procedure: ATRIAL FIBRILLATION ABLATION;  Surgeon: Regan Lemming, MD;  Location: [facility];  Service: Cardiovascular;  Laterality: N/A;   CATARACT EXTRACTION W/PHACO Left 03/27/2015   Procedure: CATARACT EXTRACTION PHACO AND INTRAOCULAR LENS PLACEMENT LEFT EYE CDE=11.73;  Surgeon: Gemma Payor, MD;  Location: [facility];  Service: Ophthalmology;  Laterality: Left;   CATARACT EXTRACTION W/PHACO Right 03/30/2015   Procedure: CATARACT EXTRACTION PHACO AND INTRAOCULAR LENS PLACEMENT RIGHT EYE CDE=7.35;  Surgeon: Gemma Payor, MD;  Location: [facility];  Service: Ophthalmology;  Laterality: Right;   CHOLECYSTECTOMY     COLONOSCOPY  07/23/2010   Dr. Elmer Ramp rectum, scattered pancolonic diverticula   COLONOSCOPY  08/10/1999   internal hemorrhoids,inflammatory polyp   ESOPHAGOGASTRODUODENOSCOPY  04/12/2008   Prominent Schatzki's ring, erosive reflux esophagitis, multiple  antral erosions, small hiatal hernia, reactive gastropathy, status post dilation with 29F   ESOPHAGOGASTRODUODENOSCOPY  10/28/2011   Procedure: ESOPHAGOGASTRODUODENOSCOPY (EGD);  Surgeon: Daneil Dolin, MD;  Location: [facility];  Service: Endoscopy;  Laterality: N/A;  1:30   ESOPHAGOGASTRODUODENOSCOPY N/A 09/10/2017   Dr. Gala Romney: Schatkzi's ring s/p dilation at GE junction, mild esophageal reflux esophagitis, medium sized hiatal hernia, normal duodenum. 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  Subsequent APC of remainder GAVE.   EYE SURGERY     HOT HEMOSTASIS  08/17/2021   Procedure: HOT HEMOSTASIS (ARGON PLASMA COAGULATION/BICAP);  Surgeon: Eloise Harman, DO;  Location: [facility];  Service: Endoscopy;;   JOINT REPLACEMENT N/A    Phreesia 01/15/2020   MALONEY DILATION  10/28/2011   Procedure: Venia Minks DILATION;  Surgeon: Daneil Dolin, MD;  [facility]: [facility];  Service: Endoscopy;  Laterality: N/A;   MALONEY DILATION N/A 09/10/2017   Procedure: Venia Minks DILATION;  Surgeon: Daneil Dolin, MD;  [facility]: [facility];  Service: Endoscopy;  Laterality: N/A;   SAVORY DILATION  10/28/2011   Procedure: SAVORY DILATION;  Surgeon: Daneil Dolin, MD;  [facility]: [facility];  Service: Endoscopy;  Laterality: N/A;   TONSILLECTOMY     TOTAL ANKLE ARTHROPLASTY Left 11/25/2013   Procedure: TOTAL ANKLE ARTHOPLASTY;  Surgeon: Wylene Simmer, MD;  Location: [place];  Service: Orthopedics;  Laterality: Left;   TOTAL ANKLE ARTHROPLASTY Right 07/14/2014   dr hewitt   TOTAL ANKLE ARTHROPLASTY Right 07/14/2014   Procedure: RIGHT TOTAL ANKLE ARTHOPLASTY;  Surgeon: Wylene Simmer, MD;  Location: [place];  Service: Orthopedics;  Laterality: Right;    Family History  Problem Relation Age of Onset   Prostate cancer Father    Cancer Father        prostate to bone   Hypertension Father    Prostate cancer Son 79   ALS Son    Prostate cancer Brother    Heart disease Mother        died at 72   Arthritis Mother    Colon cancer Neg Hx     Social History   Socioeconomic History   Marital status: Married    Spouse name: Candace   Number of children: 2   Years of education: Not on file   Highest education level: Bachelor's degree (e.g., BA, AB, BS)  Occupational History   Occupation: retired; Ambulance person: RETIRED  Tobacco Use   Smoking status: Never   Smokeless tobacco: Never  Vaping Use   Vaping Use: Never  used  Substance and Sexual Activity   Alcohol use: Yes    Alcohol/week: 2.0 standard drinks of alcohol    Types: 1 Glasses of wine, 1 Cans of beer per week    Comment: glass of wine or "couple of beers" occasionally    Drug use: No   Sexual activity: Yes    Partners: Female  Other Topics Concern   Not on file  Social History Narrative   Lives with wife Candace.       Moved from [place], [place]   Retired from [employer].   Plays golf.   Works with [employer] and [employer] for [employer].   Wears seatbelt.   Drives.   Eats all food groups.   Never smoked.   Married for over 54 years  June  (2020)   Has two grown children, live in [place] and [place].   Social Determinants of Health   Financial Resource Strain: Low Risk  (02/21/2022)   Overall Financial Resource Strain (CARDIA)    Difficulty of Paying Living Expenses: Not hard at all  Food Insecurity: No Food Insecurity (02/21/2022)   Hunger Vital Sign    Worried About Running Out of Food in the Last Year: Never true    Ran Out of Food in the Last Year: Never true  Transportation Needs: No Transportation Needs (02/21/2022)   PRAPARE - Hydrologist (Medical): No    Lack of Transportation (Non-Medical): No  Physical Activity: Sufficiently Active (02/21/2022)   Exercise Vital Sign    Days of Exercise per Week: 7 days    Minutes of Exercise per Session: 100 min  Stress: No Stress Concern Present (02/21/2022)   [place]    Feeling of Stress : Not at all  Social Connections: [place] (02/21/2022)   Social Connection and Isolation Panel [NHANES]    Frequency of Communication with Friends and Family: More than three times a week    Frequency of Social Gatherings with Friends and Family: Three times a week    Attends Religious Services: More than 4 times per year    Active Member of Clubs or Organizations: Yes    Attends Theatre manager Meetings: More than 4 times per year    Marital Status: Married  Human resources officer Violence: Not At Risk (02/21/2022)   Humiliation, Afraid, Rape, and Kick questionnaire    Fear of Current or Ex-Partner: No    Emotionally Abused: No    Physically Abused: No    Sexually Abused: No    Outpatient Medications Prior to Visit  Medication Sig Dispense Refill   diphenhydramine-acetaminophen (TYLENOL PM) 25-500 MG TABS tablet Take 1 tablet by mouth at bedtime as needed (sleep and mild discomfort).      dofetilide (TIKOSYN) 500 MCG capsule Take 1 capsule (500 mcg total) by mouth 2 (two) times daily. 180 capsule 2   ELIQUIS 5 MG TABS tablet TAKE 1 TABLET BY MOUTH TWICE  DAILY 200 tablet 2   finasteride (PROSCAR) 5 MG tablet Take 1 tablet by mouth daily.     fluticasone (FLONASE) 50 MCG/ACT nasal spray USE 1 SPRAY IN BOTH  NOSTRILS DAILY AS NEEDED  FOR ALLERGIES 32 g 2   gabapentin (NEURONTIN) 100 MG capsule Take 200 mg by mouth 2 (two) times daily.     levothyroxine (SYNTHROID) 75 MCG tablet Take 1 tablet (75 mcg total) by mouth daily. 90 tablet 3   loratadine (CLARITIN) 10 MG tablet Take 10 mg by mouth daily as needed for allergies.     losartan (COZAAR) 50 MG tablet TAKE 1 TABLET BY MOUTH DAILY 100 tablet 1   Omega-3 Fatty Acids (RA FISH OIL) 1400 MG CPDR Take 1,400 mg by mouth 2 (two) times daily.     pantoprazole (PROTONIX) 40 MG tablet TAKE 1 TABLET BY MOUTH  DAILY BEFORE BREAKFAST 90 tablet 3   Polyethyl Glycol-Propyl Glycol 0.4-0.3 % SOLN Place 1 drop into both eyes 2 (two) times daily as needed (dry eyes).      polyethylene glycol (MIRALAX / GLYCOLAX) 17 g packet Take 17 g by mouth daily as needed (while on pain meds). 14 each 0   Tamsulosin HCl (FLOMAX) 0.4 MG CAPS Take 0.4 mg by mouth daily  after breakfast.      vitamin B-12 (CYANOCOBALAMIN) 1000 MCG tablet Take 1,000 mcg by mouth 3 (three) times a week.     No facility-administered medications prior to visit.    Allergies   Allergen Reactions   Tetanus Toxoids Swelling    Face swelling   Statins Other (See Comments)    Hard to walk, severe leg cramps Other reaction(s): Other (See Comments) Muscle weakness "unable to walk"   Codeine    Dicyclomine Other (See Comments)    constipation   Sulfamethoxazole     Other reaction(s): Other (See Comments) unknown   Doxycycline Rash    Rash only   Oxycodone Other (See Comments)    Severe constipation   Penicillins Swelling and Rash    Took as a child, developed rash over time Has patient had a PCN reaction causing immediate rash, facial/tongue/throat swelling, SOB or lightheadedness with hypotension: Yes Has patient had a PCN reaction causing severe rash involving mucus membranes or skin necrosis: Unknown Has patient had a PCN reaction that required hospitalization: No Has patient had a PCN reaction occurring within the last 10 years: No If all of the above answers are "NO", then may proceed with Cephalosporin use.  Other reaction(s): Other (See Comments) unknown   Sulfa Antibiotics Swelling and Rash   Xarelto [Rivaroxaban] Rash    rash    ROS Review of Systems    Objective:    Physical Exam  BP 132/88 (BP Location: Right Arm, Patient Position: Sitting, Cuff Size: Normal)   Pulse 70   Resp 18   Ht $R'5\' 10"'cq$  (1.778 m)   Wt 198 lb (89.8 kg)   SpO2 100%   BMI 28.41 kg/m  Wt Readings from Last 3 Encounters:  05/30/22 198 lb (89.8 kg)  05/06/22 196 lb (88.9 kg)  04/28/22 193 lb (87.5 kg)    Lab Results  Component Value Date   TSH 3.620 02/11/2022   Lab Results  Component Value Date   WBC 10.5 04/28/2022   HGB 14.5 04/28/2022   HCT 43.0 04/28/2022   MCV 95.6 04/28/2022   PLT 198 04/28/2022   Lab Results  Component Value Date   NA 144 04/28/2022   K 4.5 04/28/2022   CO2 27 04/28/2022   GLUCOSE 121 (H) 04/28/2022   BUN 18 04/28/2022   CREATININE 1.26 (H) 04/28/2022   BILITOT 0.7 04/28/2022   ALKPHOS 52 04/28/2022   AST 21  04/28/2022   ALT 11 04/28/2022   PROT 6.3 (L) 04/28/2022   ALBUMIN 3.6 04/28/2022   CALCIUM 9.3 04/28/2022   ANIONGAP 7 04/28/2022   EGFR 57 (L) 02/11/2022   Lab Results  Component Value Date   CHOL 152 02/11/2022   Lab Results  Component Value Date   HDL 50 02/11/2022   Lab Results  Component Value Date   LDLCALC 84 02/11/2022   Lab Results  Component Value Date   TRIG 97 02/11/2022   Lab Results  Component Value Date   CHOLHDL 3.0 02/11/2022   Lab Results  Component Value Date   HGBA1C 5.4 02/11/2022      Assessment & Plan:   Problem List Items Addressed This Visit       Cardiovascular and Mediastinum   Atrial fibrillation ([place]) - Primary    On dofetilide and Eliquis Currently in sinus rhythm Followed by Cardiology      Relevant Orders   CMP14+EGFR   HTN, goal below 140/90    BP Readings from Last  1 Encounters:  05/30/22 132/88  Well-controlled for his age 19 for compliance with the medications Advised DASH diet and moderate exercise/walking as tolerated        Endocrine   Hypothyroidism    Lab Results  Component Value Date   TSH 3.620 02/11/2022  On levothyroxine Check TSH and free T4      Relevant Orders   TSH + free T4     Genitourinary   Stage 3a chronic kidney disease ([place])    Last CMP reviewed from ER visit Previous to BMP before that also showed GFR around 57 Needs to improve fluid intake On losartan      Relevant Orders   CMP14+EGFR   Scrotal edema    Noted on CT abdomen Asymptomatic Patient has had similar finding for a long time - followed by Urology        Other   Hyperlipidemia LDL goal <100    Check lipid profile      Relevant Orders   Lipid Profile   History of pancreatitis    Recently ER visit for acute pancreatitis Unclear etiology No history of alcohol abuse, s/p cholecystectomy and TG wnl Followed by GI      Other Visit Diagnoses     Chronic anticoagulation       Relevant Orders   CBC  with Differential/Platelet   Vitamin D deficiency       Relevant Orders   Vitamin D (25 hydroxy)       No orders of the defined types were placed in this encounter.   Follow-up: Return in about 6 months (around 11/29/2022) for Annual physical.    Lindell Spar, MD

## 2022-05-30 NOTE — Assessment & Plan Note (Signed)
Check lipid profile

## 2022-05-30 NOTE — Assessment & Plan Note (Signed)
Last CMP reviewed from ER visit Previous to BMP before that also showed GFR around 57 Needs to improve fluid intake On losartan

## 2022-05-30 NOTE — Assessment & Plan Note (Signed)
BP Readings from Last 1 Encounters:  05/30/22 132/88   Well-controlled for his age 81 for compliance with the medications Advised DASH diet and moderate exercise/walking as tolerated

## 2022-05-30 NOTE — Patient Instructions (Addendum)
Please continue taking medications as prescribed.  Please continue to follow low salt diet and ambulate as tolerated.  Please get fasting blood tests done before the next visit. 

## 2022-06-13 DIAGNOSIS — K449 Diaphragmatic hernia without obstruction or gangrene: Secondary | ICD-10-CM | POA: Diagnosis not present

## 2022-06-13 DIAGNOSIS — D500 Iron deficiency anemia secondary to blood loss (chronic): Secondary | ICD-10-CM | POA: Diagnosis not present

## 2022-06-13 DIAGNOSIS — K219 Gastro-esophageal reflux disease without esophagitis: Secondary | ICD-10-CM | POA: Diagnosis not present

## 2022-06-13 DIAGNOSIS — K8500 Idiopathic acute pancreatitis without necrosis or infection: Secondary | ICD-10-CM | POA: Diagnosis not present

## 2022-06-14 LAB — LIPASE: Lipase: 38 U/L (ref 13–78)

## 2022-06-14 LAB — COMPREHENSIVE METABOLIC PANEL
ALT: 8 IU/L (ref 0–44)
AST: 18 IU/L (ref 0–40)
Albumin/Globulin Ratio: 1.8 (ref 1.2–2.2)
Albumin: 4.2 g/dL (ref 3.7–4.7)
Alkaline Phosphatase: 75 IU/L (ref 44–121)
BUN/Creatinine Ratio: 14 (ref 10–24)
BUN: 18 mg/dL (ref 8–27)
Bilirubin Total: 0.4 mg/dL (ref 0.0–1.2)
CO2: 23 mmol/L (ref 20–29)
Calcium: 9.7 mg/dL (ref 8.6–10.2)
Chloride: 108 mmol/L — ABNORMAL HIGH (ref 96–106)
Creatinine, Ser: 1.28 mg/dL — ABNORMAL HIGH (ref 0.76–1.27)
Globulin, Total: 2.3 g/dL (ref 1.5–4.5)
Glucose: 113 mg/dL — ABNORMAL HIGH (ref 70–99)
Potassium: 5.3 mmol/L — ABNORMAL HIGH (ref 3.5–5.2)
Sodium: 145 mmol/L — ABNORMAL HIGH (ref 134–144)
Total Protein: 6.5 g/dL (ref 6.0–8.5)
eGFR: 56 mL/min/{1.73_m2} — ABNORMAL LOW (ref >59)

## 2022-06-14 LAB — CBC WITH DIFFERENTIAL/PLATELET
Basophils Absolute: 0.1 10*3/uL (ref 0.0–0.2)
Basos: 1 %
EOS (ABSOLUTE): 0.3 10*3/uL (ref 0.0–0.4)
Eos: 5 %
Hematocrit: 42 % (ref 37.5–51.0)
Hemoglobin: 13.7 g/dL (ref 13.0–17.7)
Immature Grans (Abs): 0 10*3/uL (ref 0.0–0.1)
Immature Granulocytes: 0 %
Lymphocytes Absolute: 2.1 10*3/uL (ref 0.7–3.1)
Lymphs: 38 %
MCH: 30.4 pg (ref 26.6–33.0)
MCHC: 32.6 g/dL (ref 31.5–35.7)
MCV: 93 fL (ref 79–97)
Monocytes Absolute: 0.4 10*3/uL (ref 0.1–0.9)
Monocytes: 7 %
Neutrophils Absolute: 2.7 10*3/uL (ref 1.4–7.0)
Neutrophils: 49 %
Platelets: 262 10*3/uL (ref 150–450)
RBC: 4.5 x10E6/uL (ref 4.14–5.80)
RDW: 13.1 % (ref 11.6–15.4)
WBC: 5.6 10*3/uL (ref 3.4–10.8)

## 2022-06-14 LAB — IRON,TIBC AND FERRITIN PANEL
Ferritin: 33 ng/mL (ref 30–400)
Iron Saturation: 11 % — ABNORMAL LOW (ref 15–55)
Iron: 41 ug/dL (ref 38–169)
Total Iron Binding Capacity: 381 ug/dL (ref 250–450)
UIBC: 340 ug/dL (ref 111–343)

## 2022-06-18 ENCOUNTER — Encounter: Payer: Self-pay | Admitting: Gastroenterology

## 2022-06-18 ENCOUNTER — Other Ambulatory Visit: Payer: Self-pay

## 2022-06-18 ENCOUNTER — Other Ambulatory Visit: Payer: Self-pay | Admitting: Gastroenterology

## 2022-06-18 ENCOUNTER — Ambulatory Visit (HOSPITAL_COMMUNITY)
Admission: RE | Admit: 2022-06-18 | Discharge: 2022-06-18 | Disposition: A | Discharge status: Home / Self Care | Payer: Medicare Other | Source: Ambulatory Visit | Attending: Gastroenterology | Admitting: Gastroenterology

## 2022-06-18 DIAGNOSIS — D500 Iron deficiency anemia secondary to blood loss (chronic): Secondary | ICD-10-CM

## 2022-06-18 DIAGNOSIS — K859 Acute pancreatitis without necrosis or infection, unspecified: Secondary | ICD-10-CM | POA: Diagnosis not present

## 2022-06-18 DIAGNOSIS — K8500 Idiopathic acute pancreatitis without necrosis or infection: Secondary | ICD-10-CM

## 2022-06-18 DIAGNOSIS — K219 Gastro-esophageal reflux disease without esophagitis: Secondary | ICD-10-CM | POA: Insufficient documentation

## 2022-06-18 DIAGNOSIS — D509 Iron deficiency anemia, unspecified: Secondary | ICD-10-CM | POA: Diagnosis not present

## 2022-06-18 DIAGNOSIS — K449 Diaphragmatic hernia without obstruction or gangrene: Secondary | ICD-10-CM

## 2022-06-18 DIAGNOSIS — K5730 Diverticulosis of large intestine without perforation or abscess without bleeding: Secondary | ICD-10-CM | POA: Diagnosis not present

## 2022-06-18 MED ORDER — GADOBUTROL 1 MMOL/ML IV SOLN
10.0000 mL | Freq: Once | INTRAVENOUS | Status: AC | PRN
  Administered 2022-06-18: 10 mL via INTRAVENOUS

## 2022-07-15 ENCOUNTER — Other Ambulatory Visit: Payer: Self-pay | Admitting: Cardiology

## 2022-07-15 ENCOUNTER — Ambulatory Visit: Payer: Medicare Other | Attending: Cardiology | Admitting: Cardiology

## 2022-07-15 ENCOUNTER — Encounter: Payer: Self-pay | Admitting: Cardiology

## 2022-07-15 VITALS — BP 170/96 | HR 66 | Ht 70.0 in | Wt 198.0 lb

## 2022-07-15 DIAGNOSIS — I4819 Other persistent atrial fibrillation: Secondary | ICD-10-CM

## 2022-07-15 DIAGNOSIS — D6869 Other thrombophilia: Secondary | ICD-10-CM

## 2022-07-15 DIAGNOSIS — Z79899 Other long term (current) drug therapy: Secondary | ICD-10-CM | POA: Diagnosis not present

## 2022-07-15 DIAGNOSIS — I10 Essential (primary) hypertension: Secondary | ICD-10-CM | POA: Diagnosis not present

## 2022-07-15 MED ORDER — IRBESARTAN 300 MG PO TABS: 300.0000 mg | ORAL_TABLET | Freq: Every day | ORAL | 0 refills | Status: DC

## 2022-07-15 NOTE — Progress Notes (Signed)
Electrophysiology Office Note   Date:  07/15/2022   ID:  Brent, Wilkins 1941-02-22, MRN [id number]  PCP:  Lindell Spar, MD  Cardiologist:  Harrington Challenger Primary Electrophysiologist:  Nakaiya Beddow [name], MD    No chief complaint on file.     History of Present Illness: Brent Wilkins is a 81 y.o. male who is being seen today for the evaluation of atrial fibrillation at the request of Lindell Spar, MD. Presenting today for electrophysiology evaluation.    He has a history significant for hypertension, persistent atrial fibrillation, hyperlipidemia.  He was hospitalized April 2020 after an episode of syncope.  He was found to be bradycardic and was not on AV nodal blockers.  Review of ECG showed intermittent episodes of sinus rhythm.  He was loaded on dofetilide.  He had more frequent episodes of atrial fibrillation and is post ablation 10/06/2019.  He was admitted to the hospital January 2023 with anemia.  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  Today, denies symptoms of palpitations, chest pain, shortness of breath, orthopnea, PND, lower extremity edema, claudication, dizziness, presyncope, syncope, bleeding, or neurologic sequela. The patient is tolerating medications without difficulties.  He has been feeling well.  He did have an episode of pancreatitis at the end of the summer.  Aside from that, he has no major complaints.  He does note that his blood pressure has been elevated at home.   Past Medical History:  Diagnosis Date   Acute blood loss anemia 08/16/2021   Allergy    hay fever;  seasonal   Arthritis    right ankle   Cataract    surgery 2016   Chronic kidney disease    stones many years ago   Constipation 12/09/2018   Dizziness 12/13/2015   Dysphagia    Family history of malignant neoplasm of prostate 12/12/2017   Frequent urination at night    GERD (gastroesophageal reflux disease)    History of arthroplasty of right ankle 02/04/2020   History  of kidney stones    History of prostate cancer 06/16/2017   HTN (hypertension)    Hyperlipidemia LDL goal <100 10/26/2019   Hypertension    Phreesia 04/24/2020   Hypothyroidism    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 on Tikosyn and Eliquis b. s/p ablation in 10/2019 due to break-through episodes while on Tikosyn.    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 Rosana Hoes   Schatzki's ring 10/22/2011   Seasonal allergies    Syncope  dx. 8 yrs ago   Syncope, cardiogenic 03/20/2017   Vaccine counseling 09/15/2019   Past Surgical History:  Procedure Laterality Date   APPENDECTOMY     ATRIAL FIBRILLATION ABLATION N/A 10/06/2019   Procedure: ATRIAL FIBRILLATION ABLATION;  Surgeon: Constance Haw, MD;  Location: [place] [facility];  Service: Cardiovascular;  Laterality: N/A;   CATARACT EXTRACTION W/PHACO Left 03/27/2015   Procedure: CATARACT EXTRACTION PHACO AND INTRAOCULAR LENS PLACEMENT LEFT EYE CDE=11.73;  Surgeon: Tonny Branch, MD;  Location: [facility];  Service: Ophthalmology;  Laterality: Left;   CATARACT EXTRACTION W/PHACO Right 03/30/2015   Procedure: CATARACT EXTRACTION PHACO AND INTRAOCULAR LENS PLACEMENT RIGHT EYE CDE=7.35;  Surgeon: Tonny Branch, MD;  Location: [facility];  Service: Ophthalmology;  Laterality: Right;   CHOLECYSTECTOMY  COLONOSCOPY  07/23/2010   Dr. Vivi Ferns rectum, scattered pancolonic diverticula   COLONOSCOPY  08/10/1999   internal hemorrhoids,inflammatory polyp   ESOPHAGOGASTRODUODENOSCOPY  04/12/2008   Prominent Schatzki's ring, erosive reflux esophagitis, multiple antral erosions, small hiatal hernia, reactive gastropathy, status post dilation with 26F   ESOPHAGOGASTRODUODENOSCOPY  10/28/2011   Procedure: ESOPHAGOGASTRODUODENOSCOPY (EGD);  Surgeon: Daneil Dolin, MD;  Location: [facility];  Service: Endoscopy;   Laterality: N/A;  1:30   ESOPHAGOGASTRODUODENOSCOPY N/A 09/10/2017   Dr. Gala Romney: Schatkzi's ring s/p dilation at GE junction, mild esophageal reflux esophagitis, medium sized hiatal hernia, normal duodenum. 78 and 48 Fr dilation   ESOPHAGOGASTRODUODENOSCOPY (EGD) WITH PROPOFOL N/A 08/17/2021   Moderate sized hiatal hernia, nodular GAVE 1 spot actively oozing status post APC.  Subsequent APC of remainder GAVE.   EYE SURGERY     HOT HEMOSTASIS  08/17/2021   Procedure: HOT HEMOSTASIS (ARGON PLASMA COAGULATION/BICAP);  Surgeon: Eloise Harman, DO;  [facility];  Service: Endoscopy;;   JOINT REPLACEMENT N/A    Phreesia 01/15/2020   MALONEY DILATION  10/28/2011   Procedure: Venia Minks DILATION;  Surgeon: Daneil Dolin, MD;  [facility];  Service: Endoscopy;  Laterality: N/A;   MALONEY DILATION N/A 09/10/2017   Procedure: Venia Minks DILATION;  Surgeon: Daneil Dolin, MD;  [facility];  Service: Endoscopy;  Laterality: N/A;   SAVORY DILATION  10/28/2011   Procedure: SAVORY DILATION;  Surgeon: Daneil Dolin, MD;  [facility];  Service: Endoscopy;  Laterality: N/A;   TONSILLECTOMY     TOTAL ANKLE ARTHROPLASTY Left 11/25/2013   Procedure: TOTAL ANKLE ARTHOPLASTY;  Surgeon: Wylene Simmer, MD;  Location: [facility];  Service: Orthopedics;  Laterality: Left;   TOTAL ANKLE ARTHROPLASTY Right 07/14/2014   dr hewitt   TOTAL ANKLE ARTHROPLASTY Right 07/14/2014   Procedure: RIGHT TOTAL ANKLE ARTHOPLASTY;  Surgeon: Wylene Simmer, MD;  Location: [facility];  Service: Orthopedics;  Laterality: Right;     Current Outpatient Medications  Medication Sig Dispense Refill   Cholecalciferol (VITAMIN D) 50 MCG (2000 UT) CAPS Take 1 capsule by mouth daily.     diphenhydramine-acetaminophen (TYLENOL PM) 25-500 MG TABS tablet Take 1 tablet by mouth at bedtime as needed (sleep and mild discomfort).      dofetilide (TIKOSYN) 500 MCG capsule Take 1 capsule (500 mcg total) by mouth 2 (two)  times daily. 180 capsule 2   ELIQUIS 5 MG TABS tablet TAKE 1 TABLET BY MOUTH TWICE  DAILY 200 tablet 2   ferrous sulfate 325 (65 FE) MG tablet Take 325 mg by mouth as directed. Mon-Wed-Fri     finasteride (PROSCAR) 5 MG tablet Take 1 tablet by mouth daily.     fluticasone (FLONASE) 50 MCG/ACT nasal spray USE 1 SPRAY IN BOTH  NOSTRILS DAILY AS NEEDED  FOR ALLERGIES 32 g 2   gabapentin (NEURONTIN) 100 MG capsule Take 200 mg by mouth 2 (two) times daily.     irbesartan (AVAPRO) 300 MG tablet Take 1 tablet (300 mg total) by mouth daily. 30 tablet 0   levothyroxine (SYNTHROID) 75 MCG tablet Take 1 tablet (75 mcg total) by mouth daily. 90 tablet 3   loratadine (CLARITIN) 10 MG tablet Take 10 mg by mouth daily as needed for allergies.     Omega-3 Fatty Acids (RA FISH OIL) 1400 MG CPDR Take 1,400 mg by mouth 2 (two) times daily.     pantoprazole (PROTONIX) 40 MG tablet TAKE 1 TABLET BY  MOUTH  DAILY BEFORE BREAKFAST 90 tablet 3   Polyethyl Glycol-Propyl Glycol 0.4-0.3 % SOLN Place 1 drop into both eyes 2 (two) times daily as needed (dry eyes).      polyethylene glycol (MIRALAX / GLYCOLAX) 17 g packet Take 17 g by mouth daily as needed (while on pain meds). 14 each 0   Tamsulosin HCl (FLOMAX) 0.4 MG CAPS Take 0.4 mg by mouth daily after breakfast.      vitamin B-12 (CYANOCOBALAMIN) 1000 MCG tablet Take 1,000 mcg by mouth 3 (three) times a week.     No current facility-administered medications for this visit.    Allergies:   Tetanus toxoids, Statins, Codeine, Dicyclomine, Sulfamethoxazole, Doxycycline, Oxycodone, Penicillins, Sulfa antibiotics, and Xarelto [rivaroxaban]   Social History:  The patient  reports that he has never smoked. He has never used smokeless tobacco. He reports current alcohol use of about 2.0 standard drinks of alcohol per week. He reports that he does not use drugs.   Family History:  The patient's family history includes ALS in his son; Arthritis in his mother; Cancer in his  father; Heart disease in his mother; Hypertension in his father; Prostate cancer in his brother and father; Prostate cancer (age of onset: 44) in his son.   ROS:  Please see the history of present illness.   Otherwise, review of systems is positive for none.   All other systems are reviewed and negative.   PHYSICAL EXAM: VS:  BP (!) 170/96   Pulse 66   Ht '5\' 10"'$  (1.778 m)   Wt 198 lb (89.8 kg)   SpO2 100%   BMI 28.41 kg/m  , BMI Body mass index is 28.41 kg/m. GEN: Well nourished, well developed, in no acute distress  HEENT: normal  Neck: no JVD, carotid bruits, or masses Cardiac: RRR; no murmurs, rubs, or gallops,no edema  Respiratory:  clear to auscultation bilaterally, normal work of breathing GI: soft, nontender, nondistended, + BS MS: no deformity or atrophy  Skin: warm and dry Neuro:  Strength and sensation are intact Psych: euthymic mood, full affect  EKG:  EKG is ordered today. Personal review of the ekg ordered shows sinus rhythm, first-degree AV block, right bundle branch block  Recent Labs: 01/22/2022: Magnesium 2.4 02/11/2022: TSH 3.620 06/13/2022: ALT 8; BUN 18; Creatinine, Ser 1.28; Hemoglobin 13.7; Platelets 262; Potassium 5.3; Sodium 145    Lipid Panel     Component Value Date/Time   CHOL 152 02/11/2022 0847   TRIG 97 02/11/2022 0847   HDL 50 02/11/2022 0847   CHOLHDL 3.0 02/11/2022 0847   CHOLHDL 3.6 01/28/2020 0952   VLDL 30 09/22/2017 1444   LDLCALC 84 02/11/2022 0847   LDLCALC 104 (H) 01/28/2020 0952     Wt Readings from Last 3 Encounters:  07/15/22 198 lb (89.8 kg)  05/30/22 198 lb (89.8 kg)  05/06/22 196 lb (88.9 kg)      Other studies Reviewed: Additional studies/ records that were reviewed today include: 30 day monitor 04/28/17 - personally reviewed  Review of the above records today demonstrates:  Atrial fibrillation and flutter seen throughout study. Multiple pauses up to 3 seconds with most occurring during early morning hours  (presumably asleep) and one occurring at approximately 1 pm.  TTE 03/21/17 - Left ventricle: The cavity size was normal. Wall thickness was   increased in a pattern of moderate LVH. Systolic function was   normal. The estimated ejection fraction was in the range of 60%   to 65%.  Wall motion was normal; there were no regional wall   motion abnormalities. Doppler parameters are consistent with   restrictive physiology, indicative of decreased left ventricular   diastolic compliance and/or increased left atrial pressure.   Doppler parameters are consistent with high ventricular filling   pressure. - Aortic valve: Moderately calcified annulus. Trileaflet. - Mitral valve: Mildly calcified annulus. - Left atrium: The atrium was severely dilated. - Right ventricle: Systolic function was mildly reduced. - Right atrium: The atrium was mildly to moderately dilated. - Tricuspid valve: There was mild-moderate regurgitation. - Pulmonary arteries: Incomplete TR jet to accurately estimated   PASP.  ASSESSMENT AND PLAN:  1.  Persistent atrial fibrillation/flutter: CHA2DS2-VASc of 3.  Status post ablation 10/06/2019.  Remains in sinus rhythm. Dofetilide 500 mcg twice daily Eliquis 5 mg twice daily  2.  Hypertension: Blood pressure is elevated today.  It is also elevated at home.  Fenna Semel stop his losartan and start him on 300 mg of Avapro.  If it remains elevated, Norvasc would be reasonable option.  3.  Second hypercoagulable state: Currently on Eliquis for atrial fibrillation as above  4.  High risk medication monitoring: Currently on dofetilide for atrial fibrillation as above.  QTc is remained stable.   Current medicines are reviewed at length with the patient today.   The patient does not have concerns regarding his medicines.  The following changes were made today: Stop losartan, start Avapro  Labs/ tests ordered today include:  Orders Placed This Encounter  Procedures   Basic metabolic panel    Magnesium   EKG 12-Lead      Disposition:   FU 6 months  Signed, Maliik Karner [name], MD  07/15/2022 12:23 PM     [address] [place] [id number] [phone] (office) [phone] (fax)

## 2022-07-15 NOTE — Patient Instructions (Addendum)
Medication Instructions:  Your physician has recommended you make the following change in your medication:  STOP Losartan START Avapro 300 mg daily  *If you need a refill on your cardiac medications before your next appointment, please call your pharmacy*   Lab Work: Tikosyn surveillance labs today:  BMET & Magnesium level  If you have labs (blood work) drawn today and your tests are completely normal, you will receive your results only by: [place] (if you have MyChart) OR A paper copy in the mail If you have any lab test that is abnormal or we need to change your treatment, we will call you to review the results.   Testing/Procedures: None ordered   Follow-Up: At [facility], you and your health needs are our priority.  As part of our continuing mission to provide you with exceptional heart care, we have created designated Provider Care Teams.  These Care Teams include your primary Cardiologist (physician) and Advanced Practice Providers (APPs -  Physician Assistants and Nurse Practitioners) who all work together to provide you with the care you need, when you need it.  Your next appointment:   6 month(s)  The format for your next appointment:   In Person  Provider:   Allegra Lai, MD    Thank you for choosing [facility]!!   Trinidad Curet, RN [phone]  Other Instructions    Important Information About Sugar       Irbesartan Tablets What is this medication? IRBESARTAN (ir be SAR tan) treats high blood pressure. It works by relaxing blood vessels, which decreases the amount of work the heart has to do. It may also be used to prevent kidney damage in people with diabetes. It belongs to a group of medications called ARBs. This medicine may be used for other purposes; ask your health care provider or pharmacist if you have questions. COMMON BRAND NAME(S): Avapro What should I tell my care team before I take this medication? They need to know if  you have any of these conditions: Heart failure Kidney or liver disease An unusual or allergic reaction to irbesartan, other medications, foods, dyes, or preservatives Pregnant or trying to get pregnant Breast-feeding How should I use this medication? Take this medication by mouth. Take it as directed on the prescription label at the same time every day. You can take it with or without food. If it upsets your stomach, take it with food. Keep taking it unless your care team tells you to stop. Talk to your care team about the use of this medication in children. Special care may be needed. Overdosage: If you think you have taken too much of this medicine contact a poison control center or emergency room at once. NOTE: This medicine is only for you. Do not share this medicine with others. What if I miss a dose? If you miss a dose, take it as soon as you can. If it is almost time for your next dose, take only that dose. Do not take double or extra doses. What may interact with this medication? This medication may interact with the following: Diuretics, especially triamterene, spironolactone, or amiloride Potassium salts or potassium supplements This list may not describe all possible interactions. Give your health care provider a list of all the medicines, herbs, non-prescription drugs, or dietary supplements you use. Also tell them if you smoke, drink alcohol, or use illegal drugs. Some items may interact with your medicine. What should I watch for while using this medication? Visit  your care team for regular checks on your progress. Check your blood pressure as directed. Ask your care team what your blood pressure should be and when you should contact them. Call your care team if you notice an irregular or fast heart beat. Women should inform their care team if they wish to become pregnant or think they might be pregnant. There is a potential for serious side effects to an unborn child, particularly  in the second or third trimester. Talk to your care team or pharmacist for more information. You may get drowsy or dizzy. Do not drive, use machinery, or do anything that needs mental alertness until you know how this medication affects you. Do not stand or sit up quickly, especially if you are an older patient. This reduces the risk of dizzy or fainting spells. Alcohol can make you more drowsy and dizzy. Avoid alcoholic drinks. Avoid salt substitutes unless you are told otherwise by your care team. Do not treat yourself for coughs, colds, or pain while you are taking this medication without asking your care team for advice. Some ingredients may increase your blood pressure. What side effects may I notice from receiving this medication? Side effects that you should report to your care team as soon as possible: Allergic reactions--skin rash, itching, hives, swelling of the face, lips, tongue, or throat High potassium level--muscle weakness, fast or irregular heartbeat Kidney injury--decrease in the amount of urine, swelling of the ankles, hands, or feet Low blood pressure--dizziness, feeling faint or lightheaded, blurry vision Side effects that usually do not require medical attention (report to your care team if they continue or are bothersome): Diarrhea Dizziness Fatigue Upset stomach This list may not describe all possible side effects. Call your doctor for medical advice about side effects. You may report side effects to FDA at 1-800-FDA-1088. Where should I keep my medication? Keep out of the reach of children and pets. Store at room temperature between 15 and 30 degrees C (59 and 86 degrees F). Throw away any unused medication after the expiration date. NOTE: This sheet is a summary. It may not cover all possible information. If you have questions about this medicine, talk to your doctor, pharmacist, or health care provider.  2023 Elsevier/Gold Standard (2007-09-12 00:00:00)

## 2022-07-16 LAB — BASIC METABOLIC PANEL
BUN/Creatinine Ratio: 14 (ref 10–24)
BUN: 16 mg/dL (ref 8–27)
CO2: 22 mmol/L (ref 20–29)
Calcium: 9.8 mg/dL (ref 8.6–10.2)
Chloride: 108 mmol/L — ABNORMAL HIGH (ref 96–106)
Creatinine, Ser: 1.11 mg/dL (ref 0.76–1.27)
Glucose: 84 mg/dL (ref 70–99)
Potassium: 5 mmol/L (ref 3.5–5.2)
Sodium: 145 mmol/L — ABNORMAL HIGH (ref 134–144)
eGFR: 66 mL/min/{1.73_m2} (ref >59)

## 2022-07-16 LAB — MAGNESIUM: Magnesium: 2.2 mg/dL (ref 1.6–2.3)

## 2022-07-17 ENCOUNTER — Other Ambulatory Visit: Payer: Self-pay | Admitting: Gastroenterology

## 2022-07-17 DIAGNOSIS — R1310 Dysphagia, unspecified: Secondary | ICD-10-CM

## 2022-07-17 DIAGNOSIS — K2100 Gastro-esophageal reflux disease with esophagitis, without bleeding: Secondary | ICD-10-CM

## 2022-08-07 ENCOUNTER — Other Ambulatory Visit: Payer: Self-pay | Admitting: Internal Medicine

## 2022-08-08 ENCOUNTER — Other Ambulatory Visit: Payer: Self-pay | Admitting: Cardiology

## 2022-08-08 ENCOUNTER — Other Ambulatory Visit: Payer: Self-pay

## 2022-08-08 MED ORDER — IRBESARTAN 300 MG PO TABS: 300.0000 mg | ORAL_TABLET | Freq: Every day | ORAL | 0 refills | Status: DC

## 2022-08-08 MED ORDER — IRBESARTAN 300 MG PO TABS: 300.0000 mg | ORAL_TABLET | Freq: Every day | ORAL | 3 refills | Status: DC

## 2022-08-08 NOTE — Telephone Encounter (Signed)
Pt's med was sent to local pharmacy and [facility]  as requested by pt.

## 2022-08-21 ENCOUNTER — Telehealth: Payer: Self-pay | Admitting: Internal Medicine

## 2022-08-21 NOTE — Telephone Encounter (Signed)
Spoke to patient, he has labs already ordered

## 2022-08-21 NOTE — Telephone Encounter (Signed)
Patient called said would it be okay to go ahead and his blood work done now  having some issues now very tired easily.   Call patient back at [phone].

## 2022-08-22 DIAGNOSIS — N1831 Chronic kidney disease, stage 3a: Secondary | ICD-10-CM | POA: Diagnosis not present

## 2022-08-22 DIAGNOSIS — Z7901 Long term (current) use of anticoagulants: Secondary | ICD-10-CM | POA: Diagnosis not present

## 2022-08-22 DIAGNOSIS — I4811 Longstanding persistent atrial fibrillation: Secondary | ICD-10-CM | POA: Diagnosis not present

## 2022-08-22 DIAGNOSIS — E559 Vitamin D deficiency, unspecified: Secondary | ICD-10-CM | POA: Diagnosis not present

## 2022-08-22 DIAGNOSIS — E039 Hypothyroidism, unspecified: Secondary | ICD-10-CM | POA: Diagnosis not present

## 2022-08-23 LAB — CBC WITH DIFFERENTIAL/PLATELET
Basophils Absolute: 0 10*3/uL (ref 0.0–0.2)
Basos: 1 %
EOS (ABSOLUTE): 0.2 10*3/uL (ref 0.0–0.4)
Eos: 3 %
Hematocrit: 37.3 % — ABNORMAL LOW (ref 37.5–51.0)
Hemoglobin: 11.8 g/dL — ABNORMAL LOW (ref 13.0–17.7)
Immature Grans (Abs): 0 10*3/uL (ref 0.0–0.1)
Immature Granulocytes: 0 %
Lymphocytes Absolute: 2.4 10*3/uL (ref 0.7–3.1)
Lymphs: 35 %
MCH: 29.3 pg (ref 26.6–33.0)
MCHC: 31.6 g/dL (ref 31.5–35.7)
MCV: 93 fL (ref 79–97)
Monocytes Absolute: 0.5 10*3/uL (ref 0.1–0.9)
Monocytes: 7 %
Neutrophils Absolute: 3.8 10*3/uL (ref 1.4–7.0)
Neutrophils: 54 %
Platelets: 304 10*3/uL (ref 150–450)
RBC: 4.03 x10E6/uL — ABNORMAL LOW (ref 4.14–5.80)
RDW: 13.4 % (ref 11.6–15.4)
WBC: 7 10*3/uL (ref 3.4–10.8)

## 2022-08-23 LAB — CMP14+EGFR
ALT: 5 IU/L (ref 0–44)
AST: 15 IU/L (ref 0–40)
Albumin/Globulin Ratio: 1.7 (ref 1.2–2.2)
Albumin: 3.9 g/dL (ref 3.7–4.7)
Alkaline Phosphatase: 74 IU/L (ref 44–121)
BUN/Creatinine Ratio: 11 (ref 10–24)
BUN: 15 mg/dL (ref 8–27)
Bilirubin Total: 0.3 mg/dL (ref 0.0–1.2)
CO2: 22 mmol/L (ref 20–29)
Calcium: 9.2 mg/dL (ref 8.6–10.2)
Chloride: 109 mmol/L — ABNORMAL HIGH (ref 96–106)
Creatinine, Ser: 1.32 mg/dL — ABNORMAL HIGH (ref 0.76–1.27)
Globulin, Total: 2.3 g/dL (ref 1.5–4.5)
Glucose: 108 mg/dL — ABNORMAL HIGH (ref 70–99)
Potassium: 4.4 mmol/L (ref 3.5–5.2)
Sodium: 145 mmol/L — ABNORMAL HIGH (ref 134–144)
Total Protein: 6.2 g/dL (ref 6.0–8.5)
eGFR: 54 mL/min/{1.73_m2} — ABNORMAL LOW (ref >59)

## 2022-08-23 LAB — LIPID PANEL
Chol/HDL Ratio: 3.2 ratio (ref 0.0–5.0)
Cholesterol, Total: 136 mg/dL (ref 100–199)
HDL: 43 mg/dL (ref >39)
LDL Chol Calc (NIH): 72 mg/dL (ref 0–99)
Triglycerides: 116 mg/dL (ref 0–149)
VLDL Cholesterol Cal: 21 mg/dL (ref 5–40)

## 2022-08-23 LAB — TSH+FREE T4
Free T4: 1.29 ng/dL (ref 0.82–1.77)
TSH: 3.84 u[IU]/mL (ref 0.450–4.500)

## 2022-08-23 LAB — VITAMIN D 25 HYDROXY (VIT D DEFICIENCY, FRACTURES): Vit D, 25-Hydroxy: 30.4 ng/mL (ref 30.0–100.0)

## 2022-08-31 ENCOUNTER — Ambulatory Visit
Admission: EM | Admit: 2022-08-31 | Discharge: 2022-08-31 | Disposition: A | Discharge status: Home / Self Care | Payer: Medicare Other | Attending: Family Medicine | Admitting: Family Medicine

## 2022-08-31 DIAGNOSIS — B9789 Other viral agents as the cause of diseases classified elsewhere: Secondary | ICD-10-CM

## 2022-08-31 DIAGNOSIS — J329 Chronic sinusitis, unspecified: Secondary | ICD-10-CM | POA: Diagnosis not present

## 2022-08-31 DIAGNOSIS — Z1152 Encounter for screening for COVID-19: Secondary | ICD-10-CM | POA: Diagnosis not present

## 2022-08-31 MED ORDER — LEVOFLOXACIN 500 MG PO TABS: 500.0000 mg | ORAL_TABLET | Freq: Every day | ORAL | 0 refills | Status: DC

## 2022-08-31 NOTE — ED Triage Notes (Signed)
Pt reports he thinks he has a sinus infection. He has coughing, nasal drainage to his throat, and headache x 5 days.

## 2022-08-31 NOTE — Discharge Instructions (Signed)
I anticipate that with Flonase twice daily, over-the-counter decongestants, frequent warm saline sinus rinses throughout the day and more time that your body will clear your symptoms on its own over the next 3 to 5 days.  I have sent over an antibiotic that you may fill and for 5 days if you are continuing to worsen but I want you to try and avoid this at all costs unless you absolutely have to.  Because of your allergies to numerous other antibiotics, as well as the heart medication you are on it is not overly safe to take these types of medications so if you do end up having to take it I would get clearance from your cardiologist prior to starting it.  Again, hopefully you will not need to take the medication at all.  your COVID test should be back tomorrow

## 2022-09-01 LAB — SARS CORONAVIRUS 2 (TAT 6-24 HRS): SARS Coronavirus 2: NEGATIVE

## 2022-09-04 NOTE — ED Provider Notes (Signed)
[facility]    CSN: [id number] Arrival date & time: 08/31/22  0944      History   Chief Complaint No chief complaint on file.   HPI Brent Wilkins is a 82 y.o. male.   Patient presenting today with 5-day history of productive cough, nasal congestion, sore throat, headache, sinus pain and pressure.  Denies fever, chills, body aches, chest pain, shortness of breath, abdominal pain, nausea vomiting or diarrhea.  History of seasonal allergies on antihistamines, states he is very prone to sinus infections and they always feel like this.  Trying over-the-counter remedies with no relief.    Past Medical History:  Diagnosis Date   Acute blood loss anemia 08/16/2021   Allergy    hay fever;  seasonal   Arthritis    right ankle   Cataract    surgery 2016   Chronic kidney disease    stones many years ago   Constipation 12/09/2018   Dizziness 12/13/2015   Dysphagia    Encounter for general adult medical examination with abnormal findings 04/27/2020   Family history of malignant neoplasm of prostate 12/12/2017   Frequent urination at night    GERD (gastroesophageal reflux disease)    History of arthroplasty of right ankle 02/04/2020   History of kidney stones    History of prostate cancer 06/16/2017   HTN (hypertension)    Hyperlipidemia LDL goal <100 10/26/2019   Hypertension    Phreesia 04/24/2020   Hypothyroidism    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 on Tikosyn and Eliquis b. s/p ablation in 10/2019 due to break-through episodes while on Tikosyn.    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 Rosana Hoes   Schatzki's ring 10/22/2011   Seasonal allergies    Syncope  dx. 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   GAVE (gastric antral vascular ectasia)    Acute upper GI bleed    Symptomatic anemia    Fatigue 08/03/2020   Encounter for general adult medical examination with abnormal findings 04/27/2020   Need for immunization against influenza 04/27/2020   Malignant neoplasm of prostate ([place]) 12/20/2019   Hyperlipidemia LDL goal <100 10/26/2019   Environmental and seasonal allergies 09/15/2019   Benign essential tremor 06/16/2017   Polyneuropathy 06/16/2017   Hypothyroidism 06/16/2017   Atrial fibrillation ([place]) 06/05/2015   HTN, goal below 140/90 06/05/2015   Dysphagia 10/22/2011   GERD (gastroesophageal reflux disease) 10/22/2011    Past Surgical History:  Procedure Laterality Date   APPENDECTOMY     ATRIAL FIBRILLATION ABLATION N/A 10/06/2019   Procedure: ATRIAL FIBRILLATION ABLATION;  Surgeon: Constance Haw, MD;  Location: [place] [facility];  Service: Cardiovascular;  Laterality: N/A;   CATARACT EXTRACTION W/PHACO Left 03/27/2015   Procedure: CATARACT EXTRACTION PHACO AND INTRAOCULAR LENS PLACEMENT LEFT EYE CDE=11.73;  Surgeon: Tonny Branch, MD;  Location: [facility];  Service: Ophthalmology;  Laterality: Left;  CATARACT EXTRACTION W/PHACO Right 03/30/2015   Procedure: CATARACT EXTRACTION PHACO AND INTRAOCULAR LENS PLACEMENT RIGHT EYE CDE=7.35;  Surgeon: Tonny Branch, MD;  Location: [facility];  Service: Ophthalmology;  Laterality: Right;   CHOLECYSTECTOMY     COLONOSCOPY  07/23/2010   Dr. Vivi Ferns rectum, scattered pancolonic diverticula   COLONOSCOPY  08/10/1999   internal hemorrhoids,inflammatory polyp   ESOPHAGOGASTRODUODENOSCOPY  04/12/2008   Prominent Schatzki's ring, erosive  reflux esophagitis, multiple antral erosions, small hiatal hernia, reactive gastropathy, status post dilation with 35F   ESOPHAGOGASTRODUODENOSCOPY  10/28/2011   Procedure: ESOPHAGOGASTRODUODENOSCOPY (EGD);  Surgeon: Daneil Dolin, MD;  [facility];  Service: Endoscopy;  Laterality: N/A;  1:30   ESOPHAGOGASTRODUODENOSCOPY N/A 09/10/2017   Dr. Gala Romney: Schatkzi's ring s/p dilation at GE junction, mild esophageal reflux esophagitis, medium sized hiatal hernia, normal duodenum. 81 and 50 Fr dilation   ESOPHAGOGASTRODUODENOSCOPY (EGD) WITH PROPOFOL N/A 08/17/2021   Moderate sized hiatal hernia, nodular GAVE 1 spot actively oozing status post APC.  Subsequent APC of remainder GAVE.   EYE SURGERY     HOT HEMOSTASIS  08/17/2021   Procedure: HOT HEMOSTASIS (ARGON PLASMA COAGULATION/BICAP);  Surgeon: Eloise Harman, DO;  [facility];  Service: Endoscopy;;   JOINT REPLACEMENT N/A    Phreesia 01/15/2020   MALONEY DILATION  10/28/2011   Procedure: Venia Minks DILATION;  Surgeon: Daneil Dolin, MD;  [facility];  Service: Endoscopy;  Laterality: N/A;   MALONEY DILATION N/A 09/10/2017   Procedure: Venia Minks DILATION;  Surgeon: Daneil Dolin, MD;  [facility];  Service: Endoscopy;  Laterality: N/A;   SAVORY DILATION  10/28/2011   Procedure: SAVORY DILATION;  Surgeon: Daneil Dolin, MD;  [facility];  Service: Endoscopy;  Laterality: N/A;   TONSILLECTOMY     TOTAL ANKLE ARTHROPLASTY Left 11/25/2013   Procedure: TOTAL ANKLE ARTHOPLASTY;  Surgeon: Wylene Simmer, MD;  Location: [place];  Service: Orthopedics;  Laterality: Left;   TOTAL ANKLE ARTHROPLASTY Right 07/14/2014   dr hewitt   TOTAL ANKLE ARTHROPLASTY Right 07/14/2014   Procedure: RIGHT TOTAL ANKLE ARTHOPLASTY;  Surgeon: Wylene Simmer, MD;  Location: [place];  Service: Orthopedics;  Laterality: Right;       Home Medications    Prior to Admission medications   Medication Sig Start Date End Date  Taking? Authorizing Provider  levofloxacin (LEVAQUIN) 500 MG tablet Take 1 tablet (500 mg total) by mouth daily. 09/04/22  Yes Volney American, PA-C  Cholecalciferol (VITAMIN D) 50 MCG (2000 UT) CAPS Take 1 capsule by mouth daily.    [provider]  diphenhydramine-acetaminophen (TYLENOL PM) 25-500 MG TABS tablet Take 1 tablet by mouth at bedtime as needed (sleep and mild discomfort).     [provider]  dofetilide (TIKOSYN) 500 MCG capsule Take 1 capsule (500 mcg total) by mouth 2 (two) times daily. 05/09/22   Camnitz, Will Hassell Done, MD  ELIQUIS 5 MG TABS tablet TAKE 1 TABLET BY MOUTH TWICE  DAILY 01/21/22   Camnitz, Ocie Doyne, MD  ferrous sulfate 325 (65 FE) MG tablet Take 325 mg by mouth as directed. Mon-Wed-Fri    [provider]  finasteride (PROSCAR) 5 MG tablet Take 1 tablet by mouth daily. 05/25/20   [provider]  fluticasone (FLONASE) 50 MCG/ACT nasal spray USE 1 SPRAY IN BOTH  NOSTRILS DAILY AS NEEDED  FOR ALLERGIES 11/06/21   Lindell Spar, MD  gabapentin (NEURONTIN) 100 MG capsule Take 200 mg by mouth  2 (two) times daily.    [provider]  irbesartan (AVAPRO) 300 MG tablet Take 1 tablet (300 mg total) by mouth daily. 08/08/22   Camnitz, Will Hassell Done, MD  levothyroxine (SYNTHROID) 75 MCG tablet TAKE 1 TABLET BY MOUTH DAILY 08/08/22   Lindell Spar, MD  loratadine (CLARITIN) 10 MG tablet Take 10 mg by mouth daily as needed for allergies.    [provider]  Omega-3 Fatty Acids (RA FISH OIL) 1400 MG CPDR Take 1,400 mg by mouth 2 (two) times daily.    [provider]  pantoprazole (PROTONIX) 40 MG tablet TAKE 1 TABLET BY MOUTH DAILY  BEFORE BREAKFAST 07/18/22   Mahala Menghini, PA-C  Polyethyl Glycol-Propyl Glycol 0.4-0.3 % SOLN Place 1 drop into both eyes 2 (two) times daily as needed (dry eyes).     [provider]  polyethylene glycol (MIRALAX / GLYCOLAX) 17 g packet Take 17 g by mouth daily as needed (while on  pain meds). 04/28/22   Davonna Belling, MD  Tamsulosin HCl (FLOMAX) 0.4 MG CAPS Take 0.4 mg by mouth daily after breakfast.     [provider]  vitamin B-12 (CYANOCOBALAMIN) 1000 MCG tablet Take 1,000 mcg by mouth 3 (three) times a week.    [provider]    Family History Family History  Problem Relation Age of Onset   Prostate cancer Father    Cancer Father        prostate to bone   Hypertension Father    Prostate cancer Son 72   ALS Son    Prostate cancer Brother    Heart disease Mother        died at 67   Arthritis Mother    Colon cancer Neg Hx     Social History Social History   Tobacco Use   Smoking status: Never   Smokeless tobacco: Never  Vaping Use   Vaping Use: Never used  Substance Use Topics   Alcohol use: Yes    Alcohol/week: 2.0 standard drinks of alcohol    Types: 1 Glasses of wine, 1 Cans of beer per week    Comment: glass of wine or "couple of beers" occasionally    Drug use: No     Allergies   Tetanus toxoids, Statins, Codeine, Dicyclomine, Sulfamethoxazole, Doxycycline, Oxycodone, Penicillins, Sulfa antibiotics, and Xarelto [rivaroxaban]   Review of Systems Review of Systems Per HPI  Physical Exam Triage Vital Signs ED Triage Vitals  Enc Vitals Group     BP 08/31/22 0952 129/83     Pulse Rate 08/31/22 0952 96     Resp 08/31/22 0952 18     Temp 08/31/22 0952 98 F (36.7 C)     Temp Source 08/31/22 0952 Oral     SpO2 08/31/22 0952 97 %     Weight --      Height --      Head Circumference --      Peak Flow --      Pain Score 08/31/22 0955 5     Pain Loc --      Pain Edu? --      Excl. in [place]? --    No data found.  Updated Vital Signs BP 129/83 (BP Location: Right Arm)   Pulse 96   Temp 98 F (36.7 C) (Oral)   Resp 18   SpO2 97%   Visual Acuity Right Eye Distance:   Left Eye Distance:   Bilateral Distance:    Right  Eye Near:   Left Eye Near:    Bilateral Near:     Physical Exam Vitals and nursing  note reviewed.  Constitutional:      Appearance: He is well-developed.  HENT:     Head: Atraumatic.     Right Ear: External ear normal.     Left Ear: External ear normal.     Nose: Rhinorrhea present.     Mouth/Throat:     Pharynx: Posterior oropharyngeal erythema present. No oropharyngeal exudate.  Eyes:     Conjunctiva/sclera: Conjunctivae normal.     Pupils: Pupils are equal, round, and reactive to light.  Cardiovascular:     Rate and Rhythm: Normal rate and regular rhythm.  Pulmonary:     Effort: Pulmonary effort is normal. No respiratory distress.     Breath sounds: No wheezing or rales.  Musculoskeletal:        General: Normal range of motion.     Cervical back: Normal range of motion and neck supple.  Lymphadenopathy:     Cervical: No cervical adenopathy.  Skin:    General: Skin is warm and dry.  Neurological:     Mental Status: He is alert and oriented to person, place, and time.  Psychiatric:        Behavior: Behavior normal.      UC Treatments / Results  Labs (all labs ordered are listed, but only abnormal results are displayed) Labs Reviewed  SARS CORONAVIRUS 2 (TAT 6-24 HRS)    EKG   Radiology No results found.  Procedures Procedures (including critical care time)  Medications Ordered in UC Medications - No data to display  Initial Impression / Assessment and Plan / UC Course  I have reviewed the triage vital signs and the nursing notes.  Pertinent labs & imaging results that were available during my care of the patient were reviewed by me and considered in my medical decision making (see chart for details).     Suspect viral sinusitis, COVID testing pending, treat with Flonase, supportive over-the-counter medications, sinus rinses and other home care.  If worsening course, may start antibiotic that was sent but discussed given his allergies and chronic medications that there was potential for side effects with this medication and he should  consult his cardiologist prior to starting it if needing to start it. Final Clinical Impressions(s) / UC Diagnoses   Final diagnoses:  Viral sinusitis     Discharge Instructions      I anticipate that with Flonase twice daily, over-the-counter decongestants, frequent warm saline sinus rinses throughout the day and more time that your body will clear your symptoms on its own over the next 3 to 5 days.  I have sent over an antibiotic that you may fill and for 5 days if you are continuing to worsen but I want you to try and avoid this at all costs unless you absolutely have to.  Because of your allergies to numerous other antibiotics, as well as the heart medication you are on it is not overly safe to take these types of medications so if you do end up having to take it I would get clearance from your cardiologist prior to starting it.  Again, hopefully you will not need to take the medication at all.  your COVID test should be back tomorrow    ED Prescriptions     Medication Sig Dispense Auth. Provider   levofloxacin (LEVAQUIN) 500 MG tablet Take 1 tablet (500 mg total) by mouth daily.  5 tablet Volney American, [place]      PDMP not reviewed this encounter.   Volney American, [place] 09/04/22 1538

## 2022-09-12 ENCOUNTER — Encounter (HOSPITAL_COMMUNITY): Payer: Self-pay | Admitting: *Deleted

## 2022-09-16 ENCOUNTER — Other Ambulatory Visit: Payer: Self-pay | Admitting: Cardiology

## 2022-09-17 ENCOUNTER — Telehealth: Payer: Self-pay | Admitting: *Deleted

## 2022-09-17 ENCOUNTER — Ambulatory Visit (INDEPENDENT_AMBULATORY_CARE_PROVIDER_SITE_OTHER): Payer: Medicare Other | Admitting: Internal Medicine

## 2022-09-17 ENCOUNTER — Encounter: Payer: Self-pay | Admitting: Internal Medicine

## 2022-09-17 VITALS — BP 147/81 | HR 89 | Temp 97.3°F | Ht 70.0 in | Wt 194.4 lb

## 2022-09-17 DIAGNOSIS — L820 Inflamed seborrheic keratosis: Secondary | ICD-10-CM | POA: Diagnosis not present

## 2022-09-17 DIAGNOSIS — C4441 Basal cell carcinoma of skin of scalp and neck: Secondary | ICD-10-CM | POA: Diagnosis not present

## 2022-09-17 DIAGNOSIS — D500 Iron deficiency anemia secondary to blood loss (chronic): Secondary | ICD-10-CM | POA: Diagnosis not present

## 2022-09-17 DIAGNOSIS — L570 Actinic keratosis: Secondary | ICD-10-CM | POA: Diagnosis not present

## 2022-09-17 DIAGNOSIS — X32XXXD Exposure to sunlight, subsequent encounter: Secondary | ICD-10-CM | POA: Diagnosis not present

## 2022-09-17 NOTE — Progress Notes (Signed)
Primary Care Physician:  Lindell Spar, MD Primary Gastroenterologist:  Dr. Gala Romney  Pre-Procedure History & Physical: HPI:  Brent Wilkins is a 82 y.o. male here for for concerns of recurrent anemia.  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  He has done well until recently he is feels that he is lightheaded from time to time he looks pale.  Stools are chronically dark on iron.  3 months ago he had a hemoglobin of 13.7; 3 weeks ago hemoglobin down by 2 g (11.8) no hematochezia no real change in stool consistency.  No upper GI tract symptoms aside from GERD well-controlled on pantoprazole 40 mg daily.  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  Moreover, we considered updating his colonoscopy at his last office visit as it has been 13 years since his last exam (left-sided diverticulosis).  He continues to have an active good quality of life.  His son has ALS.  They are planning on moving back to [place] where have family support.  Past Medical History:  Diagnosis Date   Acute blood loss anemia 08/16/2021   Allergy    hay fever;  seasonal   Arthritis    right ankle   Cataract    surgery 2016   Chronic kidney disease    stones many years ago   Constipation 12/09/2018   Dizziness 12/13/2015   Dysphagia    Encounter for general adult medical examination with abnormal findings 04/27/2020   Family history of malignant neoplasm of prostate 12/12/2017   Frequent urination at night    GERD (gastroesophageal reflux disease)    History of arthroplasty of right ankle 02/04/2020   History of kidney stones    History of prostate cancer 06/16/2017   HTN (hypertension)    Hyperlipidemia LDL goal <100 10/26/2019   Hypertension    Phreesia 04/24/2020   Hypothyroidism    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 on Tikosyn and Eliquis b. s/p ablation in 10/2019 due to break-through episodes while on Tikosyn.    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 Rosana Hoes   Schatzki's ring 10/22/2011   Seasonal allergies    Syncope  dx. 8 yrs ago   Syncope, cardiogenic 03/20/2017   Vaccine counseling 09/15/2019    Past Surgical History:  Procedure Laterality Date   APPENDECTOMY     ATRIAL FIBRILLATION ABLATION N/A 10/06/2019   Procedure: ATRIAL FIBRILLATION ABLATION;  Surgeon: Constance Haw, MD;  Location: [place] [facility];  Service: Cardiovascular;  Laterality: N/A;   CATARACT EXTRACTION W/PHACO Left 03/27/2015   Procedure: CATARACT EXTRACTION PHACO AND INTRAOCULAR LENS PLACEMENT LEFT EYE CDE=11.73;  Surgeon: Tonny Branch, MD;  Location: [facility];  Service: Ophthalmology;  Laterality: Left;   CATARACT EXTRACTION W/PHACO Right 03/30/2015   Procedure: CATARACT EXTRACTION PHACO AND INTRAOCULAR LENS PLACEMENT RIGHT EYE CDE=7.35;  Surgeon: Tonny Branch, MD;  Location: [facility];  Service: Ophthalmology;  Laterality: Right;   CHOLECYSTECTOMY     COLONOSCOPY  07/23/2010   Dr. Vivi Ferns rectum, scattered pancolonic diverticula   COLONOSCOPY  08/10/1999   internal hemorrhoids,inflammatory polyp   ESOPHAGOGASTRODUODENOSCOPY  04/12/2008   Prominent Schatzki's ring, erosive reflux esophagitis, multiple antral erosions, small  hiatal hernia, reactive gastropathy, status post dilation with 19F   ESOPHAGOGASTRODUODENOSCOPY  10/28/2011   Procedure: ESOPHAGOGASTRODUODENOSCOPY (EGD);  Surgeon: Daneil Dolin, MD;  [facility];  Service: Endoscopy;  Laterality: N/A;  1:30   ESOPHAGOGASTRODUODENOSCOPY N/A 09/10/2017   Dr. Gala Romney: Schatkzi's ring s/p dilation at GE junction, mild esophageal reflux esophagitis, medium sized hiatal hernia, normal duodenum. 79 and 31 Fr dilation   ESOPHAGOGASTRODUODENOSCOPY  (EGD) WITH PROPOFOL N/A 08/17/2021   Moderate sized hiatal hernia, nodular GAVE 1 spot actively oozing status post APC.  Subsequent APC of remainder GAVE.   EYE SURGERY     HOT HEMOSTASIS  08/17/2021   Procedure: HOT HEMOSTASIS (ARGON PLASMA COAGULATION/BICAP);  Surgeon: Eloise Harman, DO;  [facility];  Service: Endoscopy;;   JOINT REPLACEMENT N/A    Phreesia 01/15/2020   MALONEY DILATION  10/28/2011   Procedure: Venia Minks DILATION;  Surgeon: Daneil Dolin, MD;  [facility];  Service: Endoscopy;  Laterality: N/A;   MALONEY DILATION N/A 09/10/2017   Procedure: Venia Minks DILATION;  Surgeon: Daneil Dolin, MD;  [facility];  Service: Endoscopy;  Laterality: N/A;   SAVORY DILATION  10/28/2011   Procedure: SAVORY DILATION;  Surgeon: Daneil Dolin, MD;  [facility];  Service: Endoscopy;  Laterality: N/A;   TONSILLECTOMY     TOTAL ANKLE ARTHROPLASTY Left 11/25/2013   Procedure: TOTAL ANKLE ARTHOPLASTY;  Surgeon: Wylene Simmer, MD;  Location: [place];  Service: Orthopedics;  Laterality: Left;   TOTAL ANKLE ARTHROPLASTY Right 07/14/2014   dr hewitt   TOTAL ANKLE ARTHROPLASTY Right 07/14/2014   Procedure: RIGHT TOTAL ANKLE ARTHOPLASTY;  Surgeon: Wylene Simmer, MD;  Location: [place];  Service: Orthopedics;  Laterality: Right;    Prior to Admission medications   Medication Sig Start Date End Date Taking? Authorizing Provider  Cholecalciferol (VITAMIN D) 50 MCG (2000 UT) CAPS Take 1 capsule by mouth daily.   Yes [provider]  diphenhydramine-acetaminophen (TYLENOL PM) 25-500 MG TABS tablet Take 1 tablet by mouth at bedtime as needed (sleep and mild discomfort).    Yes [provider]  dofetilide (TIKOSYN) 500 MCG capsule Take 1 capsule (500 mcg total) by mouth 2 (two) times daily. 05/09/22  Yes Camnitz, Will Hassell Done, MD  ELIQUIS 5 MG TABS tablet TAKE 1 TABLET BY MOUTH TWICE  DAILY 01/21/22  Yes Camnitz, Ocie Doyne, MD  ferrous sulfate  325 (65 FE) MG tablet Take 325 mg by mouth as directed. Mon-Wed-Fri   Yes [provider]  finasteride (PROSCAR) 5 MG tablet Take 1 tablet by mouth daily. 05/25/20  Yes [provider]  fluticasone (FLONASE) 50 MCG/ACT nasal spray USE 1 SPRAY IN BOTH  NOSTRILS DAILY AS NEEDED  FOR ALLERGIES 11/06/21  Yes Lindell Spar, MD  gabapentin (NEURONTIN) 100 MG capsule Take 200 mg by mouth 2 (two) times daily.   Yes [provider]  irbesartan (AVAPRO) 300 MG tablet Take 1 tablet (300 mg total) by mouth daily. 08/08/22  Yes Camnitz, Will Hassell Done, MD  levothyroxine (SYNTHROID) 75 MCG tablet TAKE 1 TABLET BY MOUTH DAILY 08/08/22  Yes Lindell Spar, MD  loratadine (CLARITIN) 10 MG tablet Take 10 mg by mouth daily as needed for allergies.   Yes [provider]  Omega-3 Fatty Acids (RA FISH OIL) 1400 MG CPDR Take 1,400 mg by mouth 2 (two) times daily.   Yes [provider]  pantoprazole (PROTONIX) 40 MG tablet TAKE 1 TABLET BY  MOUTH DAILY  BEFORE BREAKFAST 07/18/22  Yes Mahala Menghini, PA-C  Polyethyl Glycol-Propyl Glycol 0.4-0.3 % SOLN Place 1 drop into both eyes 2 (two) times daily as needed (dry eyes).    Yes [provider]  polyethylene glycol (MIRALAX / GLYCOLAX) 17 g packet Take 17 g by mouth daily as needed (while on pain meds). 04/28/22  Yes Davonna Belling, MD  Tamsulosin HCl (FLOMAX) 0.4 MG CAPS Take 0.4 mg by mouth daily after breakfast.    Yes [provider]  vitamin B-12 (CYANOCOBALAMIN) 1000 MCG tablet Take 1,000 mcg by mouth 3 (three) times a week.   Yes [provider]    Allergies as of 09/17/2022 - Review Complete 09/17/2022  Allergen Reaction Noted   Tetanus toxoids Swelling 05/10/2013   Statins Other (See Comments) 10/12/2012   Codeine  04/28/2022   Dicyclomine Other (See Comments) 12/21/2018   Sulfamethoxazole  09/26/2011   Doxycycline Rash 09/30/2011   Oxycodone Other (See Comments) 07/06/2014   Penicillins  Swelling and Rash 09/26/2011   Sulfa antibiotics Swelling and Rash 10/22/2011   Xarelto [rivaroxaban] Rash 06/16/2017    Family History  Problem Relation Age of Onset   Prostate cancer Father    Cancer Father        prostate to bone   Hypertension Father    Prostate cancer Son 34   ALS Son    Prostate cancer Brother    Heart disease Mother        died at 9   Arthritis Mother    Colon cancer Neg Hx     Social History   Socioeconomic History   Marital status: Married    Spouse name: Candace   Number of children: 2   Years of education: Not on file   Highest education level: Bachelor's degree (e.g., BA, AB, BS)  Occupational History   Occupation: retired; Ambulance person: RETIRED  Tobacco Use   Smoking status: Never   Smokeless tobacco: Never  Vaping Use   Vaping Use: Never used  Substance and Sexual Activity   Alcohol use: Yes    Alcohol/week: 2.0 standard drinks of alcohol    Types: 1 Glasses of wine, 1 Cans of beer per week    Comment: glass of wine or "couple of beers" occasionally    Drug use: No   Sexual activity: Yes    Partners: Female  Other Topics Concern   Not on file  Social History Narrative   Lives with wife Candace.       Moved from [place], [place]   Retired from [employer].   Plays golf.   Works with [employer] and [employer] for [employer].   Wears seatbelt.   Drives.   Eats all food groups.   Never smoked.   Married for over 14 years June  (2020)   Has two grown children, live in [place] and [place].   Social Determinants of Health   Financial Resource Strain: Low Risk  (02/21/2022)   Overall Financial Resource Strain (CARDIA)    Difficulty of Paying Living Expenses: Not hard at all  Food Insecurity: No Food Insecurity (02/21/2022)   Hunger Vital Sign    Worried About Running Out of Food in the Last Year: Never true    Ran Out of Food in the Last Year: Never true  Transportation Needs: No Transportation Needs (02/21/2022)    PRAPARE - Transportation    Lack of Transportation (Medical): No    Lack  of Transportation (Non-Medical): No  Physical Activity: Sufficiently Active (02/21/2022)   Exercise Vital Sign    Days of Exercise per Week: 7 days    Minutes of Exercise per Session: 100 min  Stress: No Stress Concern Present (02/21/2022)   [place]    Feeling of Stress : Not at all  Social Connections: [place] (02/21/2022)   Social Connection and Isolation Panel [NHANES]    Frequency of Communication with Friends and Family: More than three times a week    Frequency of Social Gatherings with Friends and Family: Three times a week    Attends Religious Services: More than 4 times per year    Active Member of Clubs or Organizations: Yes    Attends Archivist Meetings: More than 4 times per year    Marital Status: Married  Human resources officer Violence: Not At Risk (02/21/2022)   Humiliation, Afraid, Rape, and Kick questionnaire    Fear of Current or Ex-Partner: No    Emotionally Abused: No    Physically Abused: No    Sexually Abused: No    Review of Systems: See HPI, otherwise negative ROS  Physical Exam: BP (!) 141/87 (BP Location: Right Arm, Patient Position: Sitting, Cuff Size: Large)   Pulse 89   Temp (!) 97.3 F (36.3 C) (Oral)   Ht 5' 10"$  (1.778 m)   Wt 194 lb 6.4 oz (88.2 kg)   SpO2 100%   BMI 27.89 kg/m  General:   Alert,   pleasant and cooperative in NAD Skin: Lightly pale.   Eyes:  Sclera clear, no icterus.   Conjunctiva pale. Lungs:  Clear throughout to auscultation.   No wheezes, crackles, or rhonchi. No acute distress. Heart:  Regular rate and rhythm; no murmurs, clicks, rubs,  or gallops. Abdomen: Non-distended, normal bowel sounds.  Soft and nontender without appreciable mass or hepatosplenomegaly.  Pulses:  Normal pulses noted. Extremities:  Without clubbing or edema. Rectal: No external lesions.  Good  sphincter tone.  Prostate not enlarged.  No mass.  Scant brown stool Hemoccult negative   Impression/Plan: 82 year old gentleman on antiplatelet and anticoagulation therapy presents with a complaint of being pale and not having much energy.  Clinically, no overt overt GI bleeding in fact Hemoccult negative today on 1 check.  2 g drop in his hemoglobin recently which is of concern.  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  In this clinical setting, I think it makes sense to go ahead and move in the direction of a repeat EGD and offer him a colonoscopy in the same anesthesia setting,    Recommendations:   We will proceed with a diagnostic EGD.  Will also go ahead and update your colonoscopy as it has been 13 years.  You continue to enjoy a good quality of life and I do believe this would be prudent to make sure all the bases are covered.  We will schedule an EGD and a colonoscopy in the near future for anemia.  ASA 3.  You will need to stop Eliquis 2 days prior to the procedure.  We will check with your cardiologist to get he is okay.  Your blood pressure slightly elevated today; it will be rechecked.  CBC today.  Continue pantoprazole 40 mg daily for control of GERD.  Further recommendations to follow. Notice: This dictation was prepared with Dragon dictation along with smaller phrase technology. Any transcriptional errors that result from this process are unintentional  and may not be corrected upon review.

## 2022-09-17 NOTE — Telephone Encounter (Signed)
Pharmacy please advise on holding Eliquis prior to colonoscopy/EGD  scheduled for TBD.  Thank you.

## 2022-09-17 NOTE — Telephone Encounter (Signed)
  Request for patient to stop medication prior to procedure or is needing cleareance  09/17/22  Brent Wilkins 1941/06/08  What type of surgery is being performed? Colonoscopy/Esophagogastroduodenoscopy (EGD)   When is surgery scheduled? TBD  What type of clearance is required (medical or pharmacy to hold medication or both? medication  Are there any medications that need to be held prior to surgery and how long? Eliquis x 2 days  Name of physician performing surgery?  [facility]: [phone] Fax: [phone]  Anethesia type (none, local, MAC, general)? MAC

## 2022-09-17 NOTE — H&P (View-Only) (Signed)
Primary Care Physician:  Lindell Spar, MD Primary Gastroenterologist:  Dr. Gala Romney  Pre-Procedure History & Physical: HPI:  Brent Wilkins is a 82 y.o. male here for for concerns of recurrent anemia.  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  He has done well until recently he is feels that he is lightheaded from time to time he looks pale.  Stools are chronically dark on iron.  3 months ago he had a hemoglobin of 13.7; 3 weeks ago hemoglobin down by 2 g (11.8) no hematochezia no real change in stool consistency.  No upper GI tract symptoms aside from GERD well-controlled on pantoprazole 40 mg daily.  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  Moreover, we considered updating his colonoscopy at his last office visit as it has been 13 years since his last exam (left-sided diverticulosis).  He continues to have an active good quality of life.  His son has ALS.  They are planning on moving back to [place] where have family support.  Past Medical History:  Diagnosis Date   Acute blood loss anemia 08/16/2021   Allergy    hay fever;  seasonal   Arthritis    right ankle   Cataract    surgery 2016   Chronic kidney disease    stones many years ago   Constipation 12/09/2018   Dizziness 12/13/2015   Dysphagia    Encounter for general adult medical examination with abnormal findings 04/27/2020   Family history of malignant neoplasm of prostate 12/12/2017   Frequent urination at night    GERD (gastroesophageal reflux disease)    History of arthroplasty of right ankle 02/04/2020   History of kidney stones    History of prostate cancer 06/16/2017   HTN (hypertension)    Hyperlipidemia LDL goal <100 10/26/2019   Hypertension    Phreesia 04/24/2020   Hypothyroidism    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 on Tikosyn and Eliquis b. s/p ablation in 10/2019 due to break-through episodes while on Tikosyn.    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 Rosana Hoes   Schatzki's ring 10/22/2011   Seasonal allergies    Syncope  dx. 8 yrs ago   Syncope, cardiogenic 03/20/2017   Vaccine counseling 09/15/2019    Past Surgical History:  Procedure Laterality Date   APPENDECTOMY     ATRIAL FIBRILLATION ABLATION N/A 10/06/2019   Procedure: ATRIAL FIBRILLATION ABLATION;  Surgeon: Constance Haw, MD;  Location: [place] [facility];  Service: Cardiovascular;  Laterality: N/A;   CATARACT EXTRACTION W/PHACO Left 03/27/2015   Procedure: CATARACT EXTRACTION PHACO AND INTRAOCULAR LENS PLACEMENT LEFT EYE CDE=11.73;  Surgeon: Tonny Branch, MD;  Location: [facility];  Service: Ophthalmology;  Laterality: Left;   CATARACT EXTRACTION W/PHACO Right 03/30/2015   Procedure: CATARACT EXTRACTION PHACO AND INTRAOCULAR LENS PLACEMENT RIGHT EYE CDE=7.35;  Surgeon: Tonny Branch, MD;  Location: [facility];  Service: Ophthalmology;  Laterality: Right;   CHOLECYSTECTOMY     COLONOSCOPY  07/23/2010   Dr. Vivi Ferns rectum, scattered pancolonic diverticula   COLONOSCOPY  08/10/1999   internal hemorrhoids,inflammatory polyp   ESOPHAGOGASTRODUODENOSCOPY  04/12/2008   Prominent Schatzki's ring, erosive reflux esophagitis, multiple antral erosions, small  hiatal hernia, reactive gastropathy, status post dilation with 32F   ESOPHAGOGASTRODUODENOSCOPY  10/28/2011   Procedure: ESOPHAGOGASTRODUODENOSCOPY (EGD);  Surgeon: Daneil Dolin, MD;  Location: [facility];  Service: Endoscopy;  Laterality: N/A;  1:30   ESOPHAGOGASTRODUODENOSCOPY N/A 09/10/2017   Dr. Gala Romney: Schatkzi's ring s/p dilation at GE junction, mild esophageal reflux esophagitis, medium sized hiatal hernia, normal duodenum. 57 and 70 Fr dilation   ESOPHAGOGASTRODUODENOSCOPY  (EGD) WITH PROPOFOL N/A 08/17/2021   Moderate sized hiatal hernia, nodular GAVE 1 spot actively oozing status post APC.  Subsequent APC of remainder GAVE.   EYE SURGERY     HOT HEMOSTASIS  08/17/2021   Procedure: HOT HEMOSTASIS (ARGON PLASMA COAGULATION/BICAP);  Surgeon: Eloise Harman, DO;  [facility];  Service: Endoscopy;;   JOINT REPLACEMENT N/A    Phreesia 01/15/2020   MALONEY DILATION  10/28/2011   Procedure: Venia Minks DILATION;  Surgeon: Daneil Dolin, MD;  [facility];  Service: Endoscopy;  Laterality: N/A;   MALONEY DILATION N/A 09/10/2017   Procedure: Venia Minks DILATION;  Surgeon: Daneil Dolin, MD;  [facility];  Service: Endoscopy;  Laterality: N/A;   SAVORY DILATION  10/28/2011   Procedure: SAVORY DILATION;  Surgeon: Daneil Dolin, MD;  [facility];  Service: Endoscopy;  Laterality: N/A;   TONSILLECTOMY     TOTAL ANKLE ARTHROPLASTY Left 11/25/2013   Procedure: TOTAL ANKLE ARTHOPLASTY;  Surgeon: Wylene Simmer, MD;  Location: [place];  Service: Orthopedics;  Laterality: Left;   TOTAL ANKLE ARTHROPLASTY Right 07/14/2014   dr hewitt   TOTAL ANKLE ARTHROPLASTY Right 07/14/2014   Procedure: RIGHT TOTAL ANKLE ARTHOPLASTY;  Surgeon: Wylene Simmer, MD;  Location: [place];  Service: Orthopedics;  Laterality: Right;    Prior to Admission medications   Medication Sig Start Date End Date Taking? Authorizing Provider  Cholecalciferol (VITAMIN D) 50 MCG (2000 UT) CAPS Take 1 capsule by mouth daily.   Yes [provider]  diphenhydramine-acetaminophen (TYLENOL PM) 25-500 MG TABS tablet Take 1 tablet by mouth at bedtime as needed (sleep and mild discomfort).    Yes [provider]  dofetilide (TIKOSYN) 500 MCG capsule Take 1 capsule (500 mcg total) by mouth 2 (two) times daily. 05/09/22  Yes Camnitz, Will Hassell Done, MD  ELIQUIS 5 MG TABS tablet TAKE 1 TABLET BY MOUTH TWICE  DAILY 01/21/22  Yes Camnitz, Ocie Doyne, MD  ferrous sulfate  325 (65 FE) MG tablet Take 325 mg by mouth as directed. Mon-Wed-Fri   Yes [provider]  finasteride (PROSCAR) 5 MG tablet Take 1 tablet by mouth daily. 05/25/20  Yes [provider]  fluticasone (FLONASE) 50 MCG/ACT nasal spray USE 1 SPRAY IN BOTH  NOSTRILS DAILY AS NEEDED  FOR ALLERGIES 11/06/21  Yes Lindell Spar, MD  gabapentin (NEURONTIN) 100 MG capsule Take 200 mg by mouth 2 (two) times daily.   Yes [provider]  irbesartan (AVAPRO) 300 MG tablet Take 1 tablet (300 mg total) by mouth daily. 08/08/22  Yes Camnitz, Will Hassell Done, MD  levothyroxine (SYNTHROID) 75 MCG tablet TAKE 1 TABLET BY MOUTH DAILY 08/08/22  Yes Lindell Spar, MD  loratadine (CLARITIN) 10 MG tablet Take 10 mg by mouth daily as needed for allergies.   Yes [provider]  Omega-3 Fatty Acids (RA FISH OIL) 1400 MG CPDR Take 1,400 mg by mouth 2 (two) times daily.   Yes [provider]  pantoprazole (PROTONIX) 40 MG tablet TAKE 1 TABLET BY  MOUTH DAILY  BEFORE BREAKFAST 07/18/22  Yes Mahala Menghini, PA-C  Polyethyl Glycol-Propyl Glycol 0.4-0.3 % SOLN Place 1 drop into both eyes 2 (two) times daily as needed (dry eyes).    Yes [provider]  polyethylene glycol (MIRALAX / GLYCOLAX) 17 g packet Take 17 g by mouth daily as needed (while on pain meds). 04/28/22  Yes Davonna Belling, MD  Tamsulosin HCl (FLOMAX) 0.4 MG CAPS Take 0.4 mg by mouth daily after breakfast.    Yes [provider]  vitamin B-12 (CYANOCOBALAMIN) 1000 MCG tablet Take 1,000 mcg by mouth 3 (three) times a week.   Yes [provider]    Allergies as of 09/17/2022 - Review Complete 09/17/2022  Allergen Reaction Noted   Tetanus toxoids Swelling 05/10/2013   Statins Other (See Comments) 10/12/2012   Codeine  04/28/2022   Dicyclomine Other (See Comments) 12/21/2018   Sulfamethoxazole  09/26/2011   Doxycycline Rash 09/30/2011   Oxycodone Other (See Comments) 07/06/2014   Penicillins  Swelling and Rash 09/26/2011   Sulfa antibiotics Swelling and Rash 10/22/2011   Xarelto [rivaroxaban] Rash 06/16/2017    Family History  Problem Relation Age of Onset   Prostate cancer Father    Cancer Father        prostate to bone   Hypertension Father    Prostate cancer Son 81   ALS Son    Prostate cancer Brother    Heart disease Mother        died at 10   Arthritis Mother    Colon cancer Neg Hx     Social History   Socioeconomic History   Marital status: Married    Spouse name: Candace   Number of children: 2   Years of education: Not on file   Highest education level: Bachelor's degree (e.g., BA, AB, BS)  Occupational History   Occupation: retired; Ambulance person: RETIRED  Tobacco Use   Smoking status: Never   Smokeless tobacco: Never  Vaping Use   Vaping Use: Never used  Substance and Sexual Activity   Alcohol use: Yes    Alcohol/week: 2.0 standard drinks of alcohol    Types: 1 Glasses of wine, 1 Cans of beer per week    Comment: glass of wine or "couple of beers" occasionally    Drug use: No   Sexual activity: Yes    Partners: Female  Other Topics Concern   Not on file  Social History Narrative   Lives with wife Candace.       Moved from [place], [place]   Retired from [employer].   Plays golf.   Works with [employer] and [employer] for [employer].   Wears seatbelt.   Drives.   Eats all food groups.   Never smoked.   Married for over 41 years June  (2020)   Has two grown children, live in [place] and [place].   Social Determinants of Health   Financial Resource Strain: Low Risk  (02/21/2022)   Overall Financial Resource Strain (CARDIA)    Difficulty of Paying Living Expenses: Not hard at all  Food Insecurity: No Food Insecurity (02/21/2022)   Hunger Vital Sign    Worried About Running Out of Food in the Last Year: Never true    Ran Out of Food in the Last Year: Never true  Transportation Needs: No Transportation Needs (02/21/2022)    PRAPARE - Transportation    Lack of Transportation (Medical): No    Lack  of Transportation (Non-Medical): No  Physical Activity: Sufficiently Active (02/21/2022)   Exercise Vital Sign    Days of Exercise per Week: 7 days    Minutes of Exercise per Session: 100 min  Stress: No Stress Concern Present (02/21/2022)   [place]    Feeling of Stress : Not at all  Social Connections: [place] (02/21/2022)   Social Connection and Isolation Panel [NHANES]    Frequency of Communication with Friends and Family: More than three times a week    Frequency of Social Gatherings with Friends and Family: Three times a week    Attends Religious Services: More than 4 times per year    Active Member of Clubs or Organizations: Yes    Attends Archivist Meetings: More than 4 times per year    Marital Status: Married  Human resources officer Violence: Not At Risk (02/21/2022)   Humiliation, Afraid, Rape, and Kick questionnaire    Fear of Current or Ex-Partner: No    Emotionally Abused: No    Physically Abused: No    Sexually Abused: No    Review of Systems: See HPI, otherwise negative ROS  Physical Exam: BP (!) 141/87 (BP Location: Right Arm, Patient Position: Sitting, Cuff Size: Large)   Pulse 89   Temp (!) 97.3 F (36.3 C) (Oral)   Ht '5\' 10"'$  (1.778 m)   Wt 194 lb 6.4 oz (88.2 kg)   SpO2 100%   BMI 27.89 kg/m  General:   Alert,   pleasant and cooperative in NAD Skin: Lightly pale.   Eyes:  Sclera clear, no icterus.   Conjunctiva pale. Lungs:  Clear throughout to auscultation.   No wheezes, crackles, or rhonchi. No acute distress. Heart:  Regular rate and rhythm; no murmurs, clicks, rubs,  or gallops. Abdomen: Non-distended, normal bowel sounds.  Soft and nontender without appreciable mass or hepatosplenomegaly.  Pulses:  Normal pulses noted. Extremities:  Without clubbing or edema. Rectal: No external lesions.  Good  sphincter tone.  Prostate not enlarged.  No mass.  Scant brown stool Hemoccult negative   Impression/Plan: 82 year old gentleman on antiplatelet and anticoagulation therapy presents with a complaint of being pale and not having much energy.  Clinically, no overt overt GI bleeding in fact Hemoccult negative today on 1 check.  2 g drop in his hemoglobin recently which is of concern.  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  In this clinical setting, I think it makes sense to go ahead and move in the direction of a repeat EGD and offer him a colonoscopy in the same anesthesia setting,    Recommendations:   We will proceed with a diagnostic EGD.  Will also go ahead and update your colonoscopy as it has been 13 years.  You continue to enjoy a good quality of life and I do believe this would be prudent to make sure all the bases are covered.  We will schedule an EGD and a colonoscopy in the near future for anemia.  ASA 3.  You will need to stop Eliquis 2 days prior to the procedure.  We will check with your cardiologist to get he is okay.  Your blood pressure slightly elevated today; it will be rechecked.  CBC today.  Continue pantoprazole 40 mg daily for control of GERD.  Further recommendations to follow. Notice: This dictation was prepared with Dragon dictation along with smaller phrase technology. Any transcriptional errors that result from this process are unintentional  and may not be corrected upon review.

## 2022-09-17 NOTE — Patient Instructions (Addendum)
It was good to see you again today!  As discussed, there is a significant decline in your hemoglobin and you may have symptoms of anemia.  No blood in your stool today but I am concerned about GI blood loss.  The vascular abnormalities are  notorious for recurrent bleeding, particularly while taking medication such as Tikosyn and Eliquis.  We will proceed with a diagnostic EGD.  Will also go ahead and update your colonoscopy as it has been 13 years.  You continue to enjoy a good quality of life and I do believe this would be prudent to make sure all the bases are covered.  We will schedule an EGD and a colonoscopy in the near future for anemia.  ASA 3.  You will need to stop Eliquis 2 days prior to the procedure.  We will check with your cardiologist to get he is okay.  Your blood pressure slightly elevated today; it will be rechecked.  CBC today.  Continue pantoprazole 40 mg daily for control of GERD.  Further recommendations to follow.

## 2022-09-18 ENCOUNTER — Ambulatory Visit: Payer: Medicare Other | Admitting: Gastroenterology

## 2022-09-18 DIAGNOSIS — D500 Iron deficiency anemia secondary to blood loss (chronic): Secondary | ICD-10-CM | POA: Diagnosis not present

## 2022-09-18 NOTE — Telephone Encounter (Signed)
Patient with diagnosis of afib on Eliquis for anticoagulation.    Procedure: EGD Date of procedure: TBD  CHA2DS2-VASc Score = 3  This indicates a 3.2% annual risk of stroke. The patient's score is based upon: CHF History: 0 HTN History: 1 Diabetes History: 0 Stroke History: 0 Vascular Disease History: 0 Age Score: 2 Gender Score: 0  CrCl 30m/min Platelet count 304K  Per office protocol, patient can hold Eliquis for 2 days prior to procedure as requested.    **This guidance is not considered finalized until pre-operative APP has relayed final recommendations.**

## 2022-09-18 NOTE — Telephone Encounter (Signed)
   Patient Name: Brent Wilkins  DOB: 1940-08-17 MRN: [id number]  Primary Cardiologist: Dorris Carnes, MD  Clinical pharmacists have reviewed the patient's past medical history, labs, and current medications as part of preoperative protocol coverage. The following recommendations have been made:  Procedure: EGD Date of procedure: TBD   CHA2DS2-VASc Score = 3  This indicates a 3.2% annual risk of stroke. The patient's score is based upon: CHF History: 0 HTN History: 1 Diabetes History: 0 Stroke History: 0 Vascular Disease History: 0 Age Score: 2 Gender Score: 0   CrCl 67m/min Platelet count 304K   Per office protocol, patient can hold Eliquis for 2 days prior to procedure as requested.    I will route this recommendation to the requesting party via Epic fax function and remove from pre-op pool.  Please call with questions.  DMable Fill EMarissa Nestle NP 09/18/2022, 10:59 AM

## 2022-09-19 LAB — CBC WITH DIFFERENTIAL/PLATELET
Basophils Absolute: 0.1 10*3/uL (ref 0.0–0.2)
Basos: 1 %
EOS (ABSOLUTE): 0.3 10*3/uL (ref 0.0–0.4)
Eos: 4 %
Hematocrit: 36 % — ABNORMAL LOW (ref 37.5–51.0)
Hemoglobin: 11.6 g/dL — ABNORMAL LOW (ref 13.0–17.7)
Immature Grans (Abs): 0 10*3/uL (ref 0.0–0.1)
Immature Granulocytes: 0 %
Lymphocytes Absolute: 2.2 10*3/uL (ref 0.7–3.1)
Lymphs: 32 %
MCH: 29.4 pg (ref 26.6–33.0)
MCHC: 32.2 g/dL (ref 31.5–35.7)
MCV: 91 fL (ref 79–97)
Monocytes Absolute: 0.4 10*3/uL (ref 0.1–0.9)
Monocytes: 6 %
Neutrophils Absolute: 4 10*3/uL (ref 1.4–7.0)
Neutrophils: 57 %
Platelets: 333 10*3/uL (ref 150–450)
RBC: 3.95 x10E6/uL — ABNORMAL LOW (ref 4.14–5.80)
RDW: 13.8 % (ref 11.6–15.4)
WBC: 7 10*3/uL (ref 3.4–10.8)

## 2022-09-23 DIAGNOSIS — M722 Plantar fascial fibromatosis: Secondary | ICD-10-CM | POA: Diagnosis not present

## 2022-09-23 DIAGNOSIS — M47816 Spondylosis without myelopathy or radiculopathy, lumbar region: Secondary | ICD-10-CM | POA: Diagnosis not present

## 2022-09-23 DIAGNOSIS — M5136 Other intervertebral disc degeneration, lumbar region: Secondary | ICD-10-CM | POA: Diagnosis not present

## 2022-09-24 ENCOUNTER — Encounter: Payer: Self-pay | Admitting: *Deleted

## 2022-09-24 ENCOUNTER — Other Ambulatory Visit: Payer: Self-pay | Admitting: *Deleted

## 2022-09-24 MED ORDER — PEG 3350-KCL-NA BICARB-NACL 420 G PO SOLR: 4000.0000 mL | Freq: Once | ORAL | 0 refills | Status: AC

## 2022-09-24 NOTE — Telephone Encounter (Signed)
Pt has been scheduled for 10/09/22, instructions mailed and prep sent to the pharmacy.  [employer] PA: APPROVED Authorization #: [id number] DOS:10/09/22-01/07/23

## 2022-10-04 ENCOUNTER — Other Ambulatory Visit: Payer: Self-pay

## 2022-10-04 DIAGNOSIS — D500 Iron deficiency anemia secondary to blood loss (chronic): Secondary | ICD-10-CM

## 2022-10-07 ENCOUNTER — Encounter (HOSPITAL_COMMUNITY): Payer: Self-pay

## 2022-10-07 ENCOUNTER — Encounter (HOSPITAL_COMMUNITY)
Admission: RE | Admit: 2022-10-07 | Discharge: 2022-10-07 | Disposition: A | Discharge status: Home / Self Care | Payer: Medicare Other | Source: Ambulatory Visit | Attending: Internal Medicine | Admitting: Internal Medicine

## 2022-10-07 ENCOUNTER — Other Ambulatory Visit: Payer: Self-pay

## 2022-10-09 ENCOUNTER — Ambulatory Visit (HOSPITAL_COMMUNITY): Payer: Medicare Other | Admitting: Certified Registered Nurse Anesthetist

## 2022-10-09 ENCOUNTER — Encounter (HOSPITAL_COMMUNITY)
Admission: RE | Disposition: A | Discharge status: Home / Self Care | Payer: Self-pay | Source: Home / Self Care | Attending: Internal Medicine

## 2022-10-09 ENCOUNTER — Encounter (HOSPITAL_COMMUNITY): Payer: Self-pay | Admitting: Internal Medicine

## 2022-10-09 ENCOUNTER — Ambulatory Visit (HOSPITAL_COMMUNITY)
Admission: RE | Admit: 2022-10-09 | Discharge: 2022-10-09 | Disposition: A | Discharge status: Home / Self Care | Payer: Medicare Other | Attending: Internal Medicine | Admitting: Internal Medicine

## 2022-10-09 ENCOUNTER — Ambulatory Visit (HOSPITAL_BASED_OUTPATIENT_CLINIC_OR_DEPARTMENT_OTHER): Payer: Medicare Other | Admitting: Certified Registered Nurse Anesthetist

## 2022-10-09 ENCOUNTER — Other Ambulatory Visit: Payer: Self-pay

## 2022-10-09 ENCOUNTER — Other Ambulatory Visit: Payer: Self-pay | Admitting: Internal Medicine

## 2022-10-09 DIAGNOSIS — K449 Diaphragmatic hernia without obstruction or gangrene: Secondary | ICD-10-CM | POA: Diagnosis not present

## 2022-10-09 DIAGNOSIS — K222 Esophageal obstruction: Secondary | ICD-10-CM | POA: Insufficient documentation

## 2022-10-09 DIAGNOSIS — E039 Hypothyroidism, unspecified: Secondary | ICD-10-CM | POA: Insufficient documentation

## 2022-10-09 DIAGNOSIS — K922 Gastrointestinal hemorrhage, unspecified: Secondary | ICD-10-CM | POA: Insufficient documentation

## 2022-10-09 DIAGNOSIS — I4891 Unspecified atrial fibrillation: Secondary | ICD-10-CM | POA: Diagnosis not present

## 2022-10-09 DIAGNOSIS — K219 Gastro-esophageal reflux disease without esophagitis: Secondary | ICD-10-CM | POA: Diagnosis not present

## 2022-10-09 DIAGNOSIS — K31819 Angiodysplasia of stomach and duodenum without bleeding: Secondary | ICD-10-CM

## 2022-10-09 DIAGNOSIS — K5790 Diverticulosis of intestine, part unspecified, without perforation or abscess without bleeding: Secondary | ICD-10-CM

## 2022-10-09 DIAGNOSIS — I10 Essential (primary) hypertension: Secondary | ICD-10-CM | POA: Insufficient documentation

## 2022-10-09 DIAGNOSIS — D649 Anemia, unspecified: Secondary | ICD-10-CM | POA: Diagnosis not present

## 2022-10-09 DIAGNOSIS — Z87442 Personal history of urinary calculi: Secondary | ICD-10-CM | POA: Diagnosis not present

## 2022-10-09 DIAGNOSIS — N189 Chronic kidney disease, unspecified: Secondary | ICD-10-CM | POA: Diagnosis not present

## 2022-10-09 DIAGNOSIS — K31811 Angiodysplasia of stomach and duodenum with bleeding: Secondary | ICD-10-CM | POA: Diagnosis not present

## 2022-10-09 DIAGNOSIS — I480 Paroxysmal atrial fibrillation: Secondary | ICD-10-CM | POA: Insufficient documentation

## 2022-10-09 DIAGNOSIS — K5730 Diverticulosis of large intestine without perforation or abscess without bleeding: Secondary | ICD-10-CM | POA: Diagnosis not present

## 2022-10-09 DIAGNOSIS — I129 Hypertensive chronic kidney disease with stage 1 through stage 4 chronic kidney disease, or unspecified chronic kidney disease: Secondary | ICD-10-CM | POA: Insufficient documentation

## 2022-10-09 DIAGNOSIS — D638 Anemia in other chronic diseases classified elsewhere: Secondary | ICD-10-CM

## 2022-10-09 HISTORY — PX: HOT HEMOSTASIS: SHX5433

## 2022-10-09 HISTORY — PX: COLONOSCOPY WITH PROPOFOL: SHX5780

## 2022-10-09 HISTORY — PX: ESOPHAGOGASTRODUODENOSCOPY: SHX5428

## 2022-10-09 SURGERY — COLONOSCOPY WITH PROPOFOL
Anesthesia: General

## 2022-10-09 MED ORDER — PROPOFOL 10 MG/ML IV BOLUS
INTRAVENOUS | Status: DC | PRN
  Administered 2022-10-09: 60 mg via INTRAVENOUS

## 2022-10-09 MED ORDER — PROPOFOL 500 MG/50ML IV EMUL
INTRAVENOUS | Status: DC | PRN
  Administered 2022-10-09: 175 ug/kg/min via INTRAVENOUS

## 2022-10-09 MED ORDER — LACTATED RINGERS IV SOLN: INTRAVENOUS | Status: DC | PRN

## 2022-10-09 MED ORDER — LIDOCAINE 2% (20 MG/ML) 5 ML SYRINGE
INTRAMUSCULAR | Status: DC | PRN
  Administered 2022-10-09: 50 mg via INTRAVENOUS

## 2022-10-09 NOTE — Anesthesia Preprocedure Evaluation (Signed)
Anesthesia Evaluation  Patient identified by MRN, date of birth, ID band Patient awake    Reviewed: Allergy & Precautions, H&P , NPO status , Patient's Chart, lab work & pertinent test results, reviewed documented beta blocker date and time   Airway Mallampati: II  TM Distance: >3 FB Neck ROM: full    Dental no notable dental hx.    Pulmonary neg pulmonary ROS   Pulmonary exam normal breath sounds clear to auscultation       Cardiovascular Exercise Tolerance: Good hypertension, + dysrhythmias Atrial Fibrillation  Rhythm:regular Rate:Normal     Neuro/Psych  Neuromuscular disease negative neurological ROS  negative psych ROS   GI/Hepatic negative GI ROS, Neg liver ROS, hiatal hernia,GERD  ,,  Endo/Other  negative endocrine ROSHypothyroidism    Renal/GU Renal diseasenegative Renal ROS  negative genitourinary   Musculoskeletal   Abdominal   Peds  Hematology negative hematology ROS (+) Blood dyscrasia, anemia   Anesthesia Other Findings   Reproductive/Obstetrics negative OB ROS                             Anesthesia Physical Anesthesia Plan  ASA: 3  Anesthesia Plan: General   Post-op Pain Management:    Induction:   PONV Risk Score and Plan: Propofol infusion  Airway Management Planned:   Additional Equipment:   Intra-op Plan:   Post-operative Plan:   Informed Consent: I have reviewed the patients History and Physical, chart, labs and discussed the procedure including the risks, benefits and alternatives for the proposed anesthesia with the patient or authorized representative who has indicated his/her understanding and acceptance.     Dental Advisory Given  Plan Discussed with: CRNA  Anesthesia Plan Comments:        Anesthesia Quick Evaluation

## 2022-10-09 NOTE — Interval H&P Note (Signed)
History and Physical Interval Note:  10/09/2022 11:55 AM  Brent Wilkins  has presented today for surgery, with the diagnosis of anemia.  The various methods of treatment have been discussed with the patient and family. After consideration of risks, benefits and other options for treatment, the patient has consented to  Procedure(s) with comments: COLONOSCOPY WITH PROPOFOL (N/A) - 11:30 am, asa 3 ESOPHAGOGASTRODUODENOSCOPY (EGD) (N/A) as a surgical intervention.  The patient's history has been reviewed, patient examined, no change in status, stable for surgery.  I have reviewed the patient's chart and labs.  Questions were answered to the patient's satisfaction.     Manus Rudd   patient without change.  Hemoglobin down to 11.6 which is stable from recent recent assay but down about 2 g from a couple of months ago.  Clinically, no overt bleeding.   Eliquis held times past 2 days.  EGD followed by colonoscopy today per plan. The risks, benefits, limitations, imponderables and alternatives regarding both EGD and colonoscopy have been reviewed with the patient. Questions have been answered. All parties agreeable.

## 2022-10-09 NOTE — Transfer of Care (Signed)
Immediate Anesthesia Transfer of Care Note  Patient: Brent Wilkins  Procedure(s) Performed: COLONOSCOPY WITH PROPOFOL ESOPHAGOGASTRODUODENOSCOPY (EGD) HOT HEMOSTASIS (ARGON PLASMA COAGULATION/BICAP)  Patient Location: PACU  Anesthesia Type:General  Level of Consciousness: sedated  Airway & Oxygen Therapy: Patient Spontanous Breathing  Post-op Assessment: Report given to RN and Post -op Vital signs reviewed and stable  Post vital signs: Reviewed and stable  Last Vitals:  Vitals Value Taken Time  BP    Temp    Pulse    Resp    SpO2      Last Pain:  Vitals:   10/09/22 1213  TempSrc:   PainSc: 0-No pain         Complications: No notable events documented.

## 2022-10-09 NOTE — Op Note (Signed)
[facility] Patient Name: Brent Wilkins Procedure Date: 10/09/2022 11:20 AM MRN: [id number] Date of Birth: 05/18/1941 Attending MD: Norvel Richards , MD, [id number] CSN: [id number] Age: 82 Admit Type: Outpatient Procedure:                Colonoscopy Indications:              Gastrointestinal occult blood loss Providers:                Norvel Richards, MD, Crystal Page, Wynonia Musty Tech, Technician Referring MD:              Medicines:                Propofol per Anesthesia Complications:            No immediate complications. Estimated Blood Loss:     Estimated blood loss: none. Procedure:                After obtaining informed consent, the colonoscope                            was passed under direct vision. Throughout the                            procedure, the patient's blood pressure, pulse, and                            oxygen saturations were monitored continuously. The                            [id number]) scope was introduced through                            the anus and advanced to the the ileocecal valve. Scope In: 12:31:28 PM Scope Out: 12:43:15 PM Scope Withdrawal Time: 0 hours 6 minutes 49 seconds  Total Procedure Duration: 0 hours 11 minutes 47 seconds  Findings:      The perianal and digital rectal examinations were normal. Distal 10 cm       of TI appeared normal.      Scattered medium-mouthed diverticula were found in the entire colon.      The exam was otherwise without abnormality on direct and retroflexion       views. Impression:               - Diverticulosis in the entire examined colon.                            Normal TI.                           - The examination was otherwise normal on direct                            and retroflexion views.                           -  No specimens collected. Moderate Sedation:      Moderate (conscious) sedation was personally administered by an        anesthesia professional. The following parameters were monitored: oxygen       saturation, heart rate, blood pressure, respiratory rate, EKG, adequacy       of pulmonary ventilation, and response to care. Recommendation:           - Patient has a contact number available for                            emergencies. The signs and symptoms of potential                            delayed complications were discussed with the                            patient. Return to normal activities tomorrow.                            Written discharge instructions were provided to the                            patient.                           - Advance diet as tolerated.                           - Continue present medications. Resume Eliquis                            today.                           - No repeat colonoscopy due to age. See EGD report                           - Return to GI clinic in 1 month. H&H in 3 weeks Procedure Code(s):        --- Professional ---                           [phone], Colonoscopy, flexible; diagnostic, including                            collection of specimen(s) by brushing or washing,                            when performed (separate procedure) Diagnosis Code(s):        --- Professional ---                           R19.5, Other fecal abnormalities                           K57.30, Diverticulosis of large intestine without  perforation or abscess without bleeding CPT copyright 2022 American Medical Association. All rights reserved. The codes documented in this report are preliminary and upon coder review may  be revised to meet current compliance requirements. Cristopher Estimable. Arlicia Paquette, MD Norvel Richards, MD 10/09/2022 12:50:21 PM This report has been signed electronically. Number of Addenda: 0

## 2022-10-09 NOTE — Discharge Instructions (Signed)
  Colonoscopy Discharge Instructions  Read the instructions outlined below and refer to this sheet in the next few weeks. These discharge instructions provide you with general information on caring for yourself after you leave the hospital. Your doctor may also give you specific instructions. While your treatment has been planned according to the most current medical practices available, unavoidable complications occasionally occur. If you have any problems or questions after discharge, call Dr. Gala Romney at [phone]. ACTIVITY You may resume your regular activity, but move at a slower pace for the next 24 hours.  Take frequent rest periods for the next 24 hours.  Walking will help get rid of the air and reduce the bloated feeling in your belly (abdomen).  No driving for 24 hours (because of the medicine (anesthesia) used during the test).   Do not sign any important legal documents or operate any machinery for 24 hours (because of the anesthesia used during the test).  NUTRITION Drink plenty of fluids.  You may resume your normal diet as instructed by your doctor.  Begin with a light meal and progress to your normal diet. Heavy or fried foods are harder to digest and may make you feel sick to your stomach (nauseated).  Avoid alcoholic beverages for 24 hours or as instructed.  MEDICATIONS You may resume your normal medications unless your doctor tells you otherwise.  WHAT YOU CAN EXPECT TODAY Some feelings of bloating in the abdomen.  Passage of more gas than usual.  Spotting of blood in your stool or on the toilet paper.  IF YOU HAD POLYPS REMOVED DURING THE COLONOSCOPY: No aspirin products for 7 days or as instructed.  No alcohol for 7 days or as instructed.  Eat a soft diet for the next 24 hours.  FINDING OUT THE RESULTS OF YOUR TEST Not all test results are available during your visit. If your test results are not back during the visit, make an appointment with your caregiver to find out the  results. Do not assume everything is normal if you have not heard from your caregiver or the medical facility. It is important for you to follow up on all of your test results.  SEEK IMMEDIATE MEDICAL ATTENTION IF: You have more than a spotting of blood in your stool.  Your belly is swollen (abdominal distention).  You are nauseated or vomiting.  You have a temperature over 101.  You have abdominal pain or discomfort that is severe or gets worse throughout the day.       Diverticulosis only found at colonoscopy.  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  They were treated with the APC unit to seal them   office visit with [place] in 1 month   H&H in 3 weeks.  At patient request, called Candace Fonner at [phone]

## 2022-10-09 NOTE — Op Note (Signed)
[facility] Patient Name: Brent Wilkins Procedure Date: 10/09/2022 11:29 AM MRN: [id number] Date of Birth: [birth year] Attending MD: Norvel Richards , MD, [id number] CSN: [id number] Age: 82 Admit Type: Outpatient Procedure:                Upper GI endoscopy Indications:              Occult blood in stool Providers:                Norvel Richards, MD, Crystal Page, Wynonia Musty Tech, Technician Referring MD:              Medicines:                Propofol per Anesthesia Complications:            No immediate complications. Estimated Blood Loss:     Estimated blood loss: none. Procedure:                Pre-Anesthesia Assessment:                           - Prior to the procedure, a History and Physical                            was performed, and patient medications and                            allergies were reviewed. The patient's tolerance of                            previous anesthesia was also reviewed. The risks                            and benefits of the procedure and the sedation                            options and risks were discussed with the patient.                            All questions were answered, and informed consent                            was obtained. Prior Anticoagulants: The patient                            last took Eliquis (apixaban) 2 days prior to the                            procedure. ASA Grade Assessment: III - A patient                            with severe systemic disease. After reviewing the  risks and benefits, the patient was deemed in                            satisfactory condition to undergo the procedure.                           After obtaining informed consent, the endoscope was                            passed under direct vision. Throughout the                            procedure, the patient's blood pressure, pulse, and                             oxygen saturations were monitored continuously. The                            [id number] scope was introduced through the                            mouth, and advanced to the second part of duodenum.                            The upper GI endoscopy was accomplished without                            difficulty. The patient tolerated the procedure                            well. Scope In: 12:19:36 PM Scope Out: 12:25:38 PM Total Procedure Duration: 0 hours 6 minutes 2 seconds  Findings:      A moderate Schatzki ring was found at the gastroesophageal junction.       Esophagus otherwise appeared normal.      A medium-sized hiatal hernia was present. No Cameron lesions. No ulcer       no infiltrating process.      Linear areas of ectatic mucosa. Please see photos. No active bleeding.       Pylorus patent. Normal-appearing first second third portion of the       duodenum      Scope was pulled back into the stomach where the APC unit was deployed       utilizing circular probe the linear areas or sealed were ablated with       multiple applications at 20 J each. Good hemostasis maintained. Please       see photos. Impression:               - Moderate Schatzki ring.                           - Medium-sized hiatal hernia.                           - Gastric antral vascular ectasia without bleeding.  Status post APC ablation.                           - No specimens collected. Moderate Sedation:      Moderate (conscious) sedation was personally administered by an       anesthesia professional. The following parameters were monitored: oxygen       saturation, heart rate, blood pressure, respiratory rate, EKG, adequacy       of pulmonary ventilation, and response to care. Recommendation:            Procedure Code(s):        --- Professional ---                           [phone], Esophagogastroduodenoscopy, flexible,                            transoral;  diagnostic, including collection of                            specimen(s) by brushing or washing, when performed                            (separate procedure) Diagnosis Code(s):        --- Professional ---                           K22.2, Esophageal obstruction                           K44.9, Diaphragmatic hernia without obstruction or                            gangrene                           K31.819, Angiodysplasia of stomach and duodenum                            without bleeding                           R19.5, Other fecal abnormalities CPT copyright 2022 American Medical Association. All rights reserved. The codes documented in this report are preliminary and upon coder review may  be revised to meet current compliance requirements. Cristopher Estimable. Gracelynne Benedict, MD Norvel Richards, MD 10/09/2022 12:46:55 PM This report has been signed electronically. Number of Addenda: 0

## 2022-10-10 NOTE — Anesthesia Postprocedure Evaluation (Signed)
Anesthesia Post Note  Patient: Brent Wilkins  Procedure(s) Performed: COLONOSCOPY WITH PROPOFOL ESOPHAGOGASTRODUODENOSCOPY (EGD) HOT HEMOSTASIS (ARGON PLASMA COAGULATION/BICAP)  Patient location during evaluation: Phase II Anesthesia Type: General Level of consciousness: awake Pain management: pain level controlled Vital Signs Assessment: post-procedure vital signs reviewed and stable Respiratory status: spontaneous breathing and respiratory function stable Cardiovascular status: blood pressure returned to baseline and stable Postop Assessment: no headache and no apparent nausea or vomiting Anesthetic complications: no Comments: Late entry   No notable events documented.   Last Vitals:  Vitals:   10/09/22 1249 10/09/22 1254  BP: (!) 96/51 (!) 103/57  Pulse: 73   Resp: 19   Temp: 36.7 C   SpO2: 96%     Last Pain:  Vitals:   10/09/22 1249  TempSrc: Oral  PainSc: 0-No pain                 Louann Sjogren

## 2022-10-17 DIAGNOSIS — D500 Iron deficiency anemia secondary to blood loss (chronic): Secondary | ICD-10-CM | POA: Diagnosis not present

## 2022-10-18 LAB — CBC WITH DIFFERENTIAL/PLATELET
Basophils Absolute: 0 10*3/uL (ref 0.0–0.2)
Basos: 1 %
EOS (ABSOLUTE): 0.3 10*3/uL (ref 0.0–0.4)
Eos: 6 %
Hematocrit: 39.5 % (ref 37.5–51.0)
Hemoglobin: 12.6 g/dL — ABNORMAL LOW (ref 13.0–17.7)
Immature Grans (Abs): 0 10*3/uL (ref 0.0–0.1)
Immature Granulocytes: 0 %
Lymphocytes Absolute: 2.4 10*3/uL (ref 0.7–3.1)
Lymphs: 42 %
MCH: 28.8 pg (ref 26.6–33.0)
MCHC: 31.9 g/dL (ref 31.5–35.7)
MCV: 90 fL (ref 79–97)
Monocytes Absolute: 0.4 10*3/uL (ref 0.1–0.9)
Monocytes: 7 %
Neutrophils Absolute: 2.5 10*3/uL (ref 1.4–7.0)
Neutrophils: 44 %
Platelets: 264 10*3/uL (ref 150–450)
RBC: 4.38 x10E6/uL (ref 4.14–5.80)
RDW: 13.9 % (ref 11.6–15.4)
WBC: 5.7 10*3/uL (ref 3.4–10.8)

## 2022-10-18 LAB — IRON,TIBC AND FERRITIN PANEL
Ferritin: 63 ng/mL (ref 30–400)
Iron Saturation: 12 % — ABNORMAL LOW (ref 15–55)
Iron: 36 ug/dL — ABNORMAL LOW (ref 38–169)
Total Iron Binding Capacity: 311 ug/dL (ref 250–450)
UIBC: 275 ug/dL (ref 111–343)

## 2022-10-21 ENCOUNTER — Encounter (HOSPITAL_COMMUNITY): Payer: Self-pay | Admitting: Internal Medicine

## 2022-10-31 DIAGNOSIS — M9902 Segmental and somatic dysfunction of thoracic region: Secondary | ICD-10-CM | POA: Diagnosis not present

## 2022-10-31 DIAGNOSIS — M47816 Spondylosis without myelopathy or radiculopathy, lumbar region: Secondary | ICD-10-CM | POA: Diagnosis not present

## 2022-10-31 DIAGNOSIS — M5136 Other intervertebral disc degeneration, lumbar region: Secondary | ICD-10-CM | POA: Diagnosis not present

## 2022-10-31 DIAGNOSIS — M9903 Segmental and somatic dysfunction of lumbar region: Secondary | ICD-10-CM | POA: Diagnosis not present

## 2022-10-31 DIAGNOSIS — M9901 Segmental and somatic dysfunction of cervical region: Secondary | ICD-10-CM | POA: Diagnosis not present

## 2022-11-04 DIAGNOSIS — M9901 Segmental and somatic dysfunction of cervical region: Secondary | ICD-10-CM | POA: Diagnosis not present

## 2022-11-04 DIAGNOSIS — M5136 Other intervertebral disc degeneration, lumbar region: Secondary | ICD-10-CM | POA: Diagnosis not present

## 2022-11-04 DIAGNOSIS — M47816 Spondylosis without myelopathy or radiculopathy, lumbar region: Secondary | ICD-10-CM | POA: Diagnosis not present

## 2022-11-04 DIAGNOSIS — M9902 Segmental and somatic dysfunction of thoracic region: Secondary | ICD-10-CM | POA: Diagnosis not present

## 2022-11-04 DIAGNOSIS — M9903 Segmental and somatic dysfunction of lumbar region: Secondary | ICD-10-CM | POA: Diagnosis not present

## 2022-11-05 DIAGNOSIS — X32XXXD Exposure to sunlight, subsequent encounter: Secondary | ICD-10-CM | POA: Diagnosis not present

## 2022-11-05 DIAGNOSIS — L570 Actinic keratosis: Secondary | ICD-10-CM | POA: Diagnosis not present

## 2022-11-05 DIAGNOSIS — Z08 Encounter for follow-up examination after completed treatment for malignant neoplasm: Secondary | ICD-10-CM | POA: Diagnosis not present

## 2022-11-05 DIAGNOSIS — Z85828 Personal history of other malignant neoplasm of skin: Secondary | ICD-10-CM | POA: Diagnosis not present

## 2022-11-12 ENCOUNTER — Ambulatory Visit: Payer: Medicare Other | Admitting: Internal Medicine

## 2022-11-12 VITALS — BP 145/79 | HR 72 | Temp 97.0°F | Ht 70.0 in | Wt 191.0 lb

## 2022-11-12 DIAGNOSIS — K219 Gastro-esophageal reflux disease without esophagitis: Secondary | ICD-10-CM | POA: Diagnosis not present

## 2022-11-12 DIAGNOSIS — K31819 Angiodysplasia of stomach and duodenum without bleeding: Secondary | ICD-10-CM | POA: Diagnosis not present

## 2022-11-12 NOTE — Progress Notes (Unsigned)
Primary Care Physician:  Anabel Halon, MD Primary Gastroenterologist:  Dr. Jena Gauss  Pre-Procedure History & Physical: HPI:  Brent Wilkins is a 82 y.o. male here for follow-up.  Occult GI blood loss in setting of Tikosyn and Eliquis.  Found to have vascular ectasias in the stomach treated with APC previously.  Hemoglobin has rallied to 12.6 on iron supplementation.  He continues to be on Eliquis and Tikosyn.  He takes Protonix 40 mg in the morning.  He has no GI complaints.  Colonoscopy last month demonstrated only diverticulosis-he is aged out a future screening.  Past Medical History:  Diagnosis Date   Acute blood loss anemia 08/16/2021   Allergy    hay fever;  seasonal   Arthritis    right ankle   Cataract    surgery 2016   Chronic kidney disease    stones many years ago   Constipation 12/09/2018   Dizziness 12/13/2015   Dysphagia    Encounter for general adult medical examination with abnormal findings 04/27/2020   Family history of malignant neoplasm of prostate 12/12/2017   Frequent urination at night    GERD (gastroesophageal reflux disease)    History of arthroplasty of right ankle 02/04/2020   History of kidney stones    History of prostate cancer 06/16/2017   HTN (hypertension)    Hyperlipidemia LDL goal <100 10/26/2019   Hypertension    Phreesia 04/24/2020   Hypothyroidism    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 on Tikosyn and Eliquis b. s/p ablation in 10/2019 due to break-through episodes while on Tikosyn.    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 Earlene Plater   Schatzki's ring 10/22/2011   Seasonal allergies    Syncope  dx. 8 yrs ago   Syncope, cardiogenic 03/20/2017   Vaccine counseling 09/15/2019    Past Surgical History:  Procedure Laterality Date   APPENDECTOMY     ATRIAL FIBRILLATION ABLATION N/A  10/06/2019   Procedure: ATRIAL FIBRILLATION ABLATION;  Surgeon: Regan Lemming, MD;  [facility]: [facility];  Service: Cardiovascular;  Laterality: N/A;   CATARACT EXTRACTION W/PHACO Left 03/27/2015   Procedure: CATARACT EXTRACTION PHACO AND INTRAOCULAR LENS PLACEMENT LEFT EYE CDE=11.73;  Surgeon: Gemma Payor, MD;  Location: [facility];  Service: Ophthalmology;  Laterality: Left;   CATARACT EXTRACTION W/PHACO Right 03/30/2015   Procedure: CATARACT EXTRACTION PHACO AND INTRAOCULAR LENS PLACEMENT RIGHT EYE CDE=7.35;  Surgeon: Gemma Payor, MD;  Location: [facility];  Service: Ophthalmology;  Laterality: Right;   CHOLECYSTECTOMY     COLONOSCOPY  07/23/2010   Dr. Elmer Ramp rectum, scattered pancolonic diverticula   COLONOSCOPY  08/10/1999   internal hemorrhoids,inflammatory polyp   COLONOSCOPY WITH PROPOFOL N/A 10/09/2022   Procedure: COLONOSCOPY WITH PROPOFOL;  Surgeon: Corbin Ade, MD;  Location: [facility];  Service: Endoscopy;  Laterality: N/A;  11:30 am, asa 3   ESOPHAGOGASTRODUODENOSCOPY  04/12/2008   Prominent Schatzki's ring, erosive reflux esophagitis, multiple antral erosions, small hiatal hernia, reactive gastropathy, status post dilation with 2F   ESOPHAGOGASTRODUODENOSCOPY  10/28/2011   Procedure: ESOPHAGOGASTRODUODENOSCOPY (EGD);  Surgeon: Corbin Ade, MD;  [facility]: [facility];  Service: Endoscopy;  Laterality: N/A;  1:30   ESOPHAGOGASTRODUODENOSCOPY N/A 09/10/2017   Dr. Jena Gauss: Schatkzi's ring s/p dilation at GE junction, mild esophageal reflux esophagitis, medium  sized hiatal hernia, normal duodenum. 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  Subsequent APC  of remainder GAVE.   EYE SURGERY     HOT HEMOSTASIS  08/17/2021   Procedure: HOT HEMOSTASIS (ARGON PLASMA COAGULATION/BICAP);  Surgeon: Lanelle Bal, DO;  [facility];  Service: Endoscopy;;   HOT HEMOSTASIS  10/09/2022   Procedure: HOT HEMOSTASIS (ARGON PLASMA COAGULATION/BICAP);  Surgeon: Corbin Ade, MD;  [facility];  Service: Endoscopy;;   JOINT REPLACEMENT N/A    Phreesia 01/15/2020   MALONEY DILATION  10/28/2011   Procedure: Elease Hashimoto DILATION;  Surgeon: Corbin Ade, MD;  [facility];  Service: Endoscopy;  Laterality: N/A;   MALONEY DILATION N/A 09/10/2017   Procedure: Elease Hashimoto DILATION;  Surgeon: Corbin Ade, MD;  [facility];  Service: Endoscopy;  Laterality: N/A;   SAVORY DILATION  10/28/2011   Procedure: SAVORY DILATION;  Surgeon: Corbin Ade, MD;  [facility];  Service: Endoscopy;  Laterality: N/A;   TONSILLECTOMY     TOTAL ANKLE ARTHROPLASTY Left 11/25/2013   Procedure: TOTAL ANKLE ARTHOPLASTY;  Surgeon: Toni Arthurs, MD;  Location: [facility] OR;  Service: Orthopedics;  Laterality: Left;   TOTAL ANKLE ARTHROPLASTY Right 07/14/2014   dr hewitt   TOTAL ANKLE ARTHROPLASTY Right 07/14/2014   Procedure: RIGHT TOTAL ANKLE ARTHOPLASTY;  Surgeon: Toni Arthurs, MD;  Location: [facility] OR;  Service: Orthopedics;  Laterality: Right;    Prior to Admission medications   Medication Sig Start Date End Date Taking? Authorizing Provider  Cholecalciferol (VITAMIN D) 50 MCG (2000 UT) CAPS Take 2,000 Units by mouth daily.   Yes [provider]  diphenhydramine-acetaminophen (TYLENOL PM) 25-500 MG TABS tablet Take 1 tablet by mouth at bedtime as needed (sleep and mild discomfort).    Yes [provider]  dofetilide (TIKOSYN) 500 MCG capsule Take 1 capsule (500 mcg total) by mouth 2 (two) times daily. 05/09/22  Yes Camnitz, Will Daphine Deutscher, MD  ELIQUIS 5 MG TABS tablet TAKE 1 TABLET BY MOUTH TWICE  DAILY 01/21/22  Yes Camnitz,  Andree Coss, MD  ferrous sulfate 325 (65 FE) MG tablet Take 325 mg by mouth every Monday, Wednesday, and Friday.   Yes [provider]  finasteride (PROSCAR) 5 MG tablet Take 5 mg by mouth daily. 05/25/20  Yes [provider]  fluticasone (FLONASE) 50 MCG/ACT nasal spray USE 1 SPRAY IN BOTH NOSTRILS  DAILY AS NEEDED FOR ALLERGIES 10/10/22  Yes Anabel Halon, MD  gabapentin (NEURONTIN) 100 MG capsule Take 200 mg by mouth 2 (two) times daily.   Yes [provider]  irbesartan (AVAPRO) 300 MG tablet TAKE 1 TABLET(300 MG) BY MOUTH DAILY 09/18/22  Yes Camnitz, Will Daphine Deutscher, MD  levothyroxine (SYNTHROID) 75 MCG tablet TAKE 1 TABLET BY MOUTH DAILY 08/08/22  Yes Anabel Halon, MD  loratadine (CLARITIN) 10 MG tablet Take 10 mg by mouth daily as needed for allergies.   Yes [provider]  Omega-3 Fatty Acids (RA FISH OIL) 1400 MG CPDR Take 1,400 mg by mouth 2 (two) times daily.   Yes [provider]  pantoprazole (PROTONIX) 40 MG tablet TAKE 1 TABLET BY MOUTH DAILY  BEFORE BREAKFAST  07/18/22  Yes Tiffany KocherLewis, Leslie S, PA-C  Polyethyl Glycol-Propyl Glycol 0.4-0.3 % SOLN Place 1 drop into both eyes 2 (two) times daily as needed (dry eyes).    Yes [provider]  polyethylene glycol (MIRALAX / GLYCOLAX) 17 g packet Take 17 g by mouth daily as needed (while on pain meds). 04/28/22  Yes Benjiman CorePickering, Nathan, MD  Tamsulosin HCl (FLOMAX) 0.4 MG CAPS Take 0.4 mg by mouth daily after breakfast.    Yes [provider]  vitamin B-12 (CYANOCOBALAMIN) 1000 MCG tablet Take 1,000 mcg by mouth 3 (three) times a week.   Yes [provider]    Allergies as of 11/12/2022 - Review Complete 11/12/2022  Allergen Reaction Noted   Tetanus toxoids Swelling 05/10/2013   Statins Other (See Comments) 10/12/2012   Codeine  04/28/2022   Dicyclomine Other (See Comments) 12/21/2018   Sulfamethoxazole  09/26/2011   Doxycycline Rash 09/30/2011   Oxycodone Other (See  Comments) 07/06/2014   Penicillins Swelling and Rash 09/26/2011   Sulfa antibiotics Swelling and Rash 10/22/2011   Xarelto [rivaroxaban] Rash 06/16/2017    Family History  Problem Relation Age of Onset   Prostate cancer Father    Cancer Father        prostate to bone   Hypertension Father    Prostate cancer Son [id number]   ALS Son    Prostate cancer Brother    Heart disease Mother        died at [id number]   Arthritis Mother    Colon cancer Neg Hx     Social History   Socioeconomic History   Marital status: Married    Spouse name: Candace   Number of children: 2   Years of education: Not on file   Highest education level: Bachelor's degree (e.g., BA, AB, BS)  Occupational History   Occupation: retired; Equities tradersales and marketing    Employer: RETIRED  Tobacco Use   Smoking status: Never   Smokeless tobacco: Never  Vaping Use   Vaping Use: Never used  Substance and Sexual Activity   Alcohol use: Yes    Alcohol/week: 2.0 standard drinks of alcohol    Types: 1 Glasses of wine, 1 Cans of beer per week    Comment: glass of wine or "couple of beers" occasionally    Drug use: No   Sexual activity: Yes    Partners: Female  Other Topics Concern   Not on file  Social History Narrative   Lives with wife Candace.       Moved from [place], [place] [id number]   Retired from [employer].   Plays golf.   Works with [employer] and [employer] for [employer].   Wears seatbelt.   Drives.   Eats all food groups.   Never smoked.   Married for over 55 years June  (2020)   Has two grown children, live in [place] and [place].   Social Determinants of Health   Financial Resource Strain: Low Risk  (02/21/2022)   Overall Financial Resource Strain (CARDIA)    Difficulty of Paying Living Expenses: Not hard at all  Food Insecurity: No Food Insecurity (02/21/2022)   Hunger Vital Sign    Worried About Running Out of Food in the Last Year: Never true    Ran Out of Food in the Last Year: Never true  Transportation Needs: No  Transportation Needs (02/21/2022)   PRAPARE - Administrator, Civil ServiceTransportation    Lack of Transportation (Medical): No    Lack of Transportation (Non-Medical): No  Physical Activity: Sufficiently Active (02/21/2022)   Exercise Vital Sign    Days of Exercise per Week: 7 days    Minutes of Exercise per Session: 100 min  Stress: No Stress Concern Present (02/21/2022)   [employer] of Occupational Health - Occupational Stress Questionnaire    Feeling of Stress : Not at all  Social Connections: Socially Integrated (02/21/2022)   Social Connection and Isolation Panel [NHANES]    Frequency of Communication with Friends and Family: More than three times a week    Frequency of Social Gatherings with Friends and Family: Three times a week    Attends Religious Services: More than 4 times per year    Active Member of Clubs or Organizations: Yes    Attends Banker Meetings: More than 4 times per year    Marital Status: Married  Catering manager Violence: Not At Risk (02/21/2022)   Humiliation, Afraid, Rape, and Kick questionnaire    Fear of Current or Ex-Partner: No    Emotionally Abused: No    Physically Abused: No    Sexually Abused: No    Review of Systems: See HPI, otherwise negative ROS  Physical Exam: BP (!) 160/88 (BP Location: Right Arm, Patient Position: Sitting, Cuff Size: Large)   Pulse 72   Temp (!) 97 F (36.1 C) (Temporal)   Ht  (1.778 m)   Wt 191 lb (86.6 kg)   SpO2 99%   BMI 27.41 kg/m  General:   Alert,  , pleasant and cooperative in NAD -accompanied by spouse  Impression/Plan: 89 year old gentleman on antiplatelet and anticoagulation therapy for atrial fibrillation here for follow-up of 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  Hemoglobin has rallied since last procedure. He feels well without any GI complaints. He and his wife tell me if things work out he will be moving to [place] in the very near future to be closer to his  family Blood pressure up today.  Recommendations:  Recheck hemoglobin in 4 to 6 weeks.    Continue Protonix 40 mg daily indefinitely  Keep your follow-up appoint with Dr. Allena Katz  Recheck blood pressure today.  We will plan for a 32-month follow-up appointment here              Notice: This dictation was prepared with Dragon dictation along with smaller phrase technology. Any transcriptional errors that result from this process are unintentional and may not be corrected upon review.

## 2022-11-12 NOTE — Patient Instructions (Signed)
It was good to see you again today!  Your hemoglobin has improved.  It should be rechecked in 4 to 6 weeks  Continue Protonix 40 mg daily indefinitely  Keep your follow-up appoint with Dr. Allena Katz  We will plan for a 34-month follow-up appointment here

## 2022-12-03 ENCOUNTER — Other Ambulatory Visit: Payer: Self-pay

## 2022-12-03 ENCOUNTER — Encounter: Payer: Medicare Other | Admitting: Internal Medicine

## 2022-12-03 DIAGNOSIS — I4891 Unspecified atrial fibrillation: Secondary | ICD-10-CM | POA: Diagnosis not present

## 2022-12-03 DIAGNOSIS — E039 Hypothyroidism, unspecified: Secondary | ICD-10-CM

## 2022-12-03 DIAGNOSIS — E785 Hyperlipidemia, unspecified: Secondary | ICD-10-CM | POA: Diagnosis not present

## 2022-12-03 DIAGNOSIS — I10 Essential (primary) hypertension: Secondary | ICD-10-CM | POA: Diagnosis not present

## 2022-12-03 DIAGNOSIS — Z139 Encounter for screening, unspecified: Secondary | ICD-10-CM

## 2022-12-03 DIAGNOSIS — N1831 Chronic kidney disease, stage 3a: Secondary | ICD-10-CM | POA: Diagnosis not present

## 2022-12-03 DIAGNOSIS — D500 Iron deficiency anemia secondary to blood loss (chronic): Secondary | ICD-10-CM | POA: Diagnosis not present

## 2022-12-03 DIAGNOSIS — E559 Vitamin D deficiency, unspecified: Secondary | ICD-10-CM | POA: Diagnosis not present

## 2022-12-03 DIAGNOSIS — R5383 Other fatigue: Secondary | ICD-10-CM

## 2022-12-04 LAB — CBC WITH DIFFERENTIAL/PLATELET
Basophils Absolute: 0 10*3/uL (ref 0.0–0.2)
Basos: 1 %
EOS (ABSOLUTE): 0.4 10*3/uL (ref 0.0–0.4)
Eos: 8 %
Hematocrit: 44.2 % (ref 37.5–51.0)
Hemoglobin: 14.6 g/dL (ref 13.0–17.7)
Immature Grans (Abs): 0 10*3/uL (ref 0.0–0.1)
Immature Granulocytes: 0 %
Lymphocytes Absolute: 1.6 10*3/uL (ref 0.7–3.1)
Lymphs: 32 %
MCH: 30.4 pg (ref 26.6–33.0)
MCHC: 33 g/dL (ref 31.5–35.7)
MCV: 92 fL (ref 79–97)
Monocytes Absolute: 0.5 10*3/uL (ref 0.1–0.9)
Monocytes: 10 %
Neutrophils Absolute: 2.6 10*3/uL (ref 1.4–7.0)
Neutrophils: 49 %
Platelets: 220 10*3/uL (ref 150–450)
RBC: 4.81 x10E6/uL (ref 4.14–5.80)
RDW: 15.8 % — ABNORMAL HIGH (ref 11.6–15.4)
WBC: 5.2 10*3/uL (ref 3.4–10.8)

## 2022-12-04 LAB — LIPID PANEL
Chol/HDL Ratio: 3.1 ratio (ref 0.0–5.0)
Cholesterol, Total: 154 mg/dL (ref 100–199)
HDL: 49 mg/dL (ref >39)
LDL Chol Calc (NIH): 85 mg/dL (ref 0–99)
Triglycerides: 109 mg/dL (ref 0–149)
VLDL Cholesterol Cal: 20 mg/dL (ref 5–40)

## 2022-12-04 LAB — PSA: Prostate Specific Ag, Serum: 2 ng/mL (ref 0.0–4.0)

## 2022-12-04 LAB — CMP14+EGFR
ALT: 14 IU/L (ref 0–44)
AST: 23 IU/L (ref 0–40)
Albumin/Globulin Ratio: 2 (ref 1.2–2.2)
Albumin: 4.1 g/dL (ref 3.7–4.7)
Alkaline Phosphatase: 79 IU/L (ref 44–121)
BUN/Creatinine Ratio: 15 (ref 10–24)
BUN: 18 mg/dL (ref 8–27)
Bilirubin Total: 0.5 mg/dL (ref 0.0–1.2)
CO2: 24 mmol/L (ref 20–29)
Calcium: 9.7 mg/dL (ref 8.6–10.2)
Chloride: 107 mmol/L — ABNORMAL HIGH (ref 96–106)
Creatinine, Ser: 1.22 mg/dL (ref 0.76–1.27)
Globulin, Total: 2.1 g/dL (ref 1.5–4.5)
Glucose: 91 mg/dL (ref 70–99)
Potassium: 5.4 mmol/L — ABNORMAL HIGH (ref 3.5–5.2)
Sodium: 144 mmol/L (ref 134–144)
Total Protein: 6.2 g/dL (ref 6.0–8.5)
eGFR: 58 mL/min/{1.73_m2} — ABNORMAL LOW (ref >59)

## 2022-12-04 LAB — TSH+FREE T4
Free T4: 1.25 ng/dL (ref 0.82–1.77)
TSH: 3.78 u[IU]/mL (ref 0.450–4.500)

## 2022-12-04 LAB — VITAMIN D 25 HYDROXY (VIT D DEFICIENCY, FRACTURES): Vit D, 25-Hydroxy: 36.3 ng/mL (ref 30.0–100.0)

## 2022-12-04 LAB — VITAMIN B12: Vitamin B-12: 1082 pg/mL (ref 232–1245)

## 2022-12-04 LAB — HEMOGLOBIN A1C
Est. average glucose Bld gHb Est-mCnc: 108 mg/dL
Hgb A1c MFr Bld: 5.4 % (ref 4.8–5.6)

## 2022-12-05 ENCOUNTER — Encounter: Payer: Self-pay | Admitting: Internal Medicine

## 2022-12-05 ENCOUNTER — Ambulatory Visit (INDEPENDENT_AMBULATORY_CARE_PROVIDER_SITE_OTHER): Payer: Medicare Other | Admitting: Internal Medicine

## 2022-12-05 VITALS — BP 138/82 | HR 71 | Ht 70.0 in | Wt 191.6 lb

## 2022-12-05 DIAGNOSIS — Z7901 Long term (current) use of anticoagulants: Secondary | ICD-10-CM

## 2022-12-05 DIAGNOSIS — Z0001 Encounter for general adult medical examination with abnormal findings: Secondary | ICD-10-CM | POA: Diagnosis not present

## 2022-12-05 DIAGNOSIS — D500 Iron deficiency anemia secondary to blood loss (chronic): Secondary | ICD-10-CM

## 2022-12-05 DIAGNOSIS — I10 Essential (primary) hypertension: Secondary | ICD-10-CM

## 2022-12-05 DIAGNOSIS — W57XXXA Bitten or stung by nonvenomous insect and other nonvenomous arthropods, initial encounter: Secondary | ICD-10-CM | POA: Diagnosis not present

## 2022-12-05 DIAGNOSIS — N1831 Chronic kidney disease, stage 3a: Secondary | ICD-10-CM | POA: Diagnosis not present

## 2022-12-05 DIAGNOSIS — I480 Paroxysmal atrial fibrillation: Secondary | ICD-10-CM | POA: Diagnosis not present

## 2022-12-05 DIAGNOSIS — E875 Hyperkalemia: Secondary | ICD-10-CM | POA: Diagnosis not present

## 2022-12-05 DIAGNOSIS — S30861A Insect bite (nonvenomous) of abdominal wall, initial encounter: Secondary | ICD-10-CM

## 2022-12-05 NOTE — Assessment & Plan Note (Signed)
On dofetilide and Eliquis Currently in sinus rhythm Followed by Cardiology 

## 2022-12-05 NOTE — Assessment & Plan Note (Signed)
Last CMP reviewed Needs to improve fluid intake On losartan

## 2022-12-05 NOTE — Patient Instructions (Addendum)
Please continue to take medications as prescribed.  Please continue to follow low salt diet and ambulate as tolerated.  Please get blood tests done after 3 weeks.

## 2022-12-05 NOTE — Assessment & Plan Note (Signed)
BP Readings from Last 1 Encounters:  12/05/22 138/82   Well-controlled for his age Counseled for compliance with the medications Advised DASH diet and moderate exercise/walking as tolerated

## 2022-12-05 NOTE — Progress Notes (Signed)
Established Patient Office Visit  Subjective:  Patient ID: Brent Wilkins, male    DOB: Dec 07, 1940  Age: 82 y.o. MRN: [id number]  CC:  Chief Complaint  Patient presents with   Annual Exam    Patient states he has had three tick bites since it has gotten warmer.    HPI Brent Wilkins is a 82 y.o. male with past medical history of A. fib, HTN, hypothyroidism, and IDA who presents for annual physical.  HTN and A Fib: BP is well-controlled for his age. Takes medications regularly. Patient denies headache, dizziness, chest pain, dyspnea or palpitations. He has been taking dofetilide and Eliquis now, denies any active signs of bleeding.  IDA: Hb improved to 14.6 now, continues to take oral iron supplements. Denies fatigue, able to walk without dyspnea now.  He reports 3 tick bites in the last week, on the right sided lower abdominal area, right thigh area and left ankle area.  He has localized redness and mild discomfort.  His rash has uniform erythema, but does not appear to be erythema migrans.  Denies any fever, chills, recent worsening of fatigue, nausea or vomiting.  He is planning to move to [place].  Past Medical History:  Diagnosis Date   Acute blood loss anemia 08/16/2021   Allergy    hay fever;  seasonal   Arthritis    right ankle   Cataract    surgery 2016   Chronic kidney disease    stones many years ago   Constipation 12/09/2018   Dizziness 12/13/2015   Dysphagia    Encounter for general adult medical examination with abnormal findings 04/27/2020   Family history of malignant neoplasm of prostate 12/12/2017   Frequent urination at night    GERD (gastroesophageal reflux disease)    History of arthroplasty of right ankle 02/04/2020   History of kidney stones    History of prostate cancer 06/16/2017   HTN (hypertension)    Hyperlipidemia LDL goal <100 10/26/2019   Hypertension    Phreesia 04/24/2020   Hypothyroidism    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 on Tikosyn and Eliquis b. s/p ablation in 10/2019 due to break-through episodes while on Tikosyn.    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 Earlene Plater   Schatzki's ring 10/22/2011   Seasonal allergies    Syncope  dx. 8 yrs ago   Syncope, cardiogenic 03/20/2017   Vaccine counseling 09/15/2019    Past Surgical History:  Procedure Laterality Date   APPENDECTOMY     ATRIAL FIBRILLATION ABLATION N/A 10/06/2019   Procedure: ATRIAL FIBRILLATION ABLATION;  Surgeon: Regan Lemming, MD;  [facility]: [facility];  Service: Cardiovascular;  Laterality: N/A;   CATARACT EXTRACTION W/PHACO Left 03/27/2015   Procedure: CATARACT EXTRACTION PHACO AND INTRAOCULAR LENS PLACEMENT LEFT EYE CDE=11.73;  Surgeon: Gemma Payor, MD;  Location: [facility];  Service: Ophthalmology;  Laterality: Left;   CATARACT EXTRACTION W/PHACO Right 03/30/2015   Procedure: CATARACT EXTRACTION PHACO AND INTRAOCULAR LENS PLACEMENT RIGHT EYE CDE=7.35;  Surgeon: Gemma Payor, MD;  Location: [facility];  Service: Ophthalmology;  Laterality: Right;   CHOLECYSTECTOMY     COLONOSCOPY  07/23/2010   Dr. Elmer Ramp rectum, scattered pancolonic diverticula   COLONOSCOPY  08/10/1999   internal hemorrhoids,inflammatory polyp   COLONOSCOPY WITH PROPOFOL N/A 10/09/2022  Procedure: COLONOSCOPY WITH PROPOFOL;  Surgeon: Corbin Ade, MD;  Location: [facility];  Service: Endoscopy;  Laterality: N/A;  11:30 am, asa 3   ESOPHAGOGASTRODUODENOSCOPY  04/12/2008   Prominent Schatzki's ring, erosive reflux esophagitis, multiple antral erosions, small hiatal hernia, reactive gastropathy, status post dilation with 39F   ESOPHAGOGASTRODUODENOSCOPY  10/28/2011   Procedure: ESOPHAGOGASTRODUODENOSCOPY (EGD);  Surgeon: Corbin Ade, MD;  [facility]: [facility];  Service: Endoscopy;  Laterality: N/A;  1:30    ESOPHAGOGASTRODUODENOSCOPY N/A 09/10/2017   Dr. Jena Gauss: Schatkzi's ring s/p dilation at GE junction, mild esophageal reflux esophagitis, medium sized hiatal hernia, normal duodenum. 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  Subsequent APC of remainder GAVE.   EYE SURGERY     HOT HEMOSTASIS  08/17/2021   Procedure: HOT HEMOSTASIS (ARGON PLASMA COAGULATION/BICAP);  Surgeon: Lanelle Bal, DO;  [facility]: [facility];  Service: Endoscopy;;   HOT HEMOSTASIS  10/09/2022   Procedure: HOT HEMOSTASIS (ARGON PLASMA COAGULATION/BICAP);  Surgeon: Corbin Ade, MD;  [facility];  Service: Endoscopy;;   JOINT REPLACEMENT N/A    Phreesia 01/15/2020   MALONEY DILATION  10/28/2011   Procedure: Elease Hashimoto DILATION;  Surgeon: Corbin Ade, MD;  [facility]: [facility];  Service: Endoscopy;  Laterality: N/A;   MALONEY DILATION N/A 09/10/2017   Procedure: Elease Hashimoto DILATION;  Surgeon: Corbin Ade, MD;  [facility];  Service: Endoscopy;  Laterality: N/A;   SAVORY DILATION  10/28/2011   Procedure: SAVORY DILATION;  Surgeon: Corbin Ade, MD;  [facility]: [facility];  Service: Endoscopy;  Laterality: N/A;   TONSILLECTOMY     TOTAL ANKLE ARTHROPLASTY Left 11/25/2013   Procedure: TOTAL ANKLE ARTHOPLASTY;  Surgeon: Toni Arthurs, MD;  Location: [facility];  Service: Orthopedics;  Laterality: Left;   TOTAL ANKLE ARTHROPLASTY Right 07/14/2014   dr hewitt   TOTAL ANKLE ARTHROPLASTY Right 07/14/2014   Procedure: RIGHT TOTAL ANKLE ARTHOPLASTY;  Surgeon: Toni Arthurs, MD;  Location: [facility] OR;  Service: Orthopedics;  Laterality: Right;    Family History  Problem Relation Age of Onset    Prostate cancer Father    Cancer Father        prostate to bone   Hypertension Father    Prostate cancer Son 30   ALS Son    Prostate cancer Brother    Heart disease Mother        died at 57   Arthritis Mother    Colon cancer Neg Hx     Social History   Socioeconomic History   Marital status: Married    Spouse name: Candace   Number of children: 2   Years of education: Not on file   Highest education level: Bachelor's degree (e.g., BA, AB, BS)  Occupational History   Occupation: retired; Equities trader: RETIRED  Tobacco Use   Smoking status: Never   Smokeless tobacco: Never  Vaping Use   Vaping Use: Never used  Substance and Sexual Activity   Alcohol use: Yes    Alcohol/week: 2.0 standard drinks of alcohol    Types: 1 Glasses of wine, 1 Cans of beer per week    Comment: glass of  wine or "couple of beers" occasionally    Drug use: No   Sexual activity: Yes    Partners: Female  Other Topics Concern   Not on file  Social History Narrative   Lives with wife Candace.       Moved from [place], [place] [address]   Retired from [employer].   Plays golf.   Works with [employer] and [employer] for [employer].   Wears seatbelt.   Drives.   Eats all food groups.   Never smoked.   Married for over 55 years June  (2020)   Has two grown children, live in [place] and [place].   Social Determinants of Health   Financial Resource Strain: Low Risk  (02/21/2022)   Overall Financial Resource Strain (CARDIA)    Difficulty of Paying Living Expenses: Not hard at all  Food Insecurity: No Food Insecurity (02/21/2022)   Hunger Vital Sign    Worried About Running Out of Food in the Last Year: Never true    Ran Out of Food in the Last Year: Never true  Transportation Needs: No Transportation Needs (02/21/2022)   PRAPARE - Administrator, Civil Service (Medical): No    Lack of Transportation (Non-Medical): No  Physical Activity: Sufficiently Active (02/21/2022)   Exercise  Vital Sign    Days of Exercise per Week: 7 days    Minutes of Exercise per Session: 100 min  Stress: No Stress Concern Present (02/21/2022)   [employer] of Occupational Health - Occupational Stress Questionnaire    Feeling of Stress : Not at all  Social Connections: Socially Integrated (02/21/2022)   Social Connection and Isolation Panel [NHANES]    Frequency of Communication with Friends and Family: More than three times a week    Frequency of Social Gatherings with Friends and Family: Three times a week    Attends Religious Services: More than 4 times per year    Active Member of Clubs or Organizations: Yes    Attends Banker Meetings: More than 4 times per year    Marital Status: Married  Catering manager Violence: Not At Risk (02/21/2022)   Humiliation, Afraid, Rape, and Kick questionnaire    Fear of Current or Ex-Partner: No    Emotionally Abused: No    Physically Abused: No    Sexually Abused: No    Outpatient Medications Prior to Visit  Medication Sig Dispense Refill   Cholecalciferol (VITAMIN D) 50 MCG (2000 UT) CAPS Take 2,000 Units by mouth daily.     diphenhydramine-acetaminophen (TYLENOL PM) 25-500 MG TABS tablet Take 1 tablet by mouth at bedtime as needed (sleep and mild discomfort).      dofetilide (TIKOSYN) 500 MCG capsule Take 1 capsule (500 mcg total) by mouth 2 (two) times daily. 180 capsule 2   ELIQUIS 5 MG TABS tablet TAKE 1 TABLET BY MOUTH TWICE  DAILY 200 tablet 2   ferrous sulfate 325 (65 FE) MG tablet Take 325 mg by mouth every Monday, Wednesday, and Friday.     finasteride (PROSCAR) 5 MG tablet Take 5 mg by mouth daily.     fluticasone (FLONASE) 50 MCG/ACT nasal spray USE 1 SPRAY IN BOTH NOSTRILS  DAILY AS NEEDED FOR ALLERGIES 32 g 2   gabapentin (NEURONTIN) 100 MG capsule Take 200 mg by mouth 2 (two) times daily.     irbesartan (AVAPRO) 300 MG tablet TAKE 1 TABLET(300 MG) BY MOUTH DAILY 30 tablet 11   levothyroxine (SYNTHROID) 75 MCG tablet  TAKE  1 TABLET BY MOUTH DAILY 100 tablet 2   loratadine (CLARITIN) 10 MG tablet Take 10 mg by mouth daily as needed for allergies.     Omega-3 Fatty Acids (RA FISH OIL) 1400 MG CPDR Take 1,400 mg by mouth 2 (two) times daily.     pantoprazole (PROTONIX) 40 MG tablet TAKE 1 TABLET BY MOUTH DAILY  BEFORE BREAKFAST 100 tablet 3   Polyethyl Glycol-Propyl Glycol 0.4-0.3 % SOLN Place 1 drop into both eyes 2 (two) times daily as needed (dry eyes).      polyethylene glycol (MIRALAX / GLYCOLAX) 17 g packet Take 17 g by mouth daily as needed (while on pain meds). 14 each 0   Tamsulosin HCl (FLOMAX) 0.4 MG CAPS Take 0.4 mg by mouth daily after breakfast.      vitamin B-12 (CYANOCOBALAMIN) 1000 MCG tablet Take 1,000 mcg by mouth 3 (three) times a week.     No facility-administered medications prior to visit.    Allergies  Allergen Reactions   Tetanus Toxoids Swelling    Face swelling   Statins Other (See Comments)    Hard to walk, severe leg cramps Other reaction(s): Other (See Comments) Muscle weakness "unable to walk"   Codeine    Dicyclomine Other (See Comments)    constipation   Sulfamethoxazole     Other reaction(s): Other (See Comments) unknown   Doxycycline Rash    Rash only   Oxycodone Other (See Comments)    Severe constipation   Penicillins Swelling and Rash    Took as a child, developed rash over time Has patient had a PCN reaction causing immediate rash, facial/tongue/throat swelling, SOB or lightheadedness with hypotension: Yes Has patient had a PCN reaction causing severe rash involving mucus membranes or skin necrosis: Unknown Has patient had a PCN reaction that required hospitalization: No Has patient had a PCN reaction occurring within the last 10 years: No If all of the above answers are "NO", then may proceed with Cephalosporin use.  Other reaction(s): Other (See Comments) unknown   Sulfa Antibiotics Swelling and Rash   Xarelto [Rivaroxaban] Rash    rash     ROS Review of Systems  Constitutional:  Negative for chills and fever.  HENT:  Negative for congestion and sore throat.   Eyes:  Negative for pain and discharge.  Respiratory:  Negative for cough and shortness of breath.   Cardiovascular:  Negative for chest pain and palpitations.  Gastrointestinal:  Negative for constipation, diarrhea, nausea and vomiting.  Endocrine: Negative for polydipsia and polyuria.  Genitourinary:  Negative for dysuria and hematuria.  Musculoskeletal:  Negative for neck pain and neck stiffness.  Skin:  Positive for rash.  Neurological:  Negative for dizziness, weakness, numbness and headaches.  Psychiatric/Behavioral:  Negative for agitation and behavioral problems.       Objective:    Physical Exam Vitals reviewed.  Constitutional:      General: He is not in acute distress.    Appearance: He is not diaphoretic.  HENT:     Head: Normocephalic and atraumatic.     Nose: Nose normal.     Mouth/Throat:     Mouth: Mucous membranes are moist.  Eyes:     General: No scleral icterus.    Extraocular Movements: Extraocular movements intact.  Cardiovascular:     Rate and Rhythm: Normal rate and regular rhythm.     Pulses: Normal pulses.     Heart sounds: Normal heart sounds. No murmur heard. Pulmonary:  Breath sounds: Normal breath sounds. No wheezing or rales.  Abdominal:     Palpations: Abdomen is soft.     Tenderness: There is no abdominal tenderness.  Musculoskeletal:     Cervical back: Neck supple. No tenderness.     Right lower leg: No edema.     Left lower leg: No edema.  Skin:    General: Skin is warm.     Findings: Erythema (Over right lower abdominal area, right thigh area and left lateral malleoli are area - about 1 cm in diameter each) present. No rash.  Neurological:     General: No focal deficit present.     Mental Status: He is alert and oriented to person, place, and time.     Cranial Nerves: No cranial nerve deficit.      Sensory: No sensory deficit.     Motor: No weakness.  Psychiatric:        Mood and Affect: Mood normal.        Behavior: Behavior normal.     BP 138/82 (BP Location: Left Arm)   Pulse 71   Ht 5\' 10"  (1.778 m)   Wt 191 lb 9.6 oz (86.9 kg)   SpO2 97%   BMI 27.49 kg/m  Wt Readings from Last 3 Encounters:  12/05/22 191 lb 9.6 oz (86.9 kg)  11/12/22 191 lb (86.6 kg)  10/07/22 194 lb 6.4 oz (88.2 kg)    Lab Results  Component Value Date   TSH 3.780 12/03/2022   Lab Results  Component Value Date   WBC 5.2 12/03/2022   HGB 14.6 12/03/2022   HCT 44.2 12/03/2022   MCV 92 12/03/2022   PLT 220 12/03/2022   Lab Results  Component Value Date   NA 144 12/03/2022   K 5.4 (H) 12/03/2022   CO2 24 12/03/2022   GLUCOSE 91 12/03/2022   BUN 18 12/03/2022   CREATININE 1.22 12/03/2022   BILITOT 0.5 12/03/2022   ALKPHOS 79 12/03/2022   AST 23 12/03/2022   ALT 14 12/03/2022   PROT 6.2 12/03/2022   ALBUMIN 4.1 12/03/2022   CALCIUM 9.7 12/03/2022   ANIONGAP 7 04/28/2022   EGFR 58 (L) 12/03/2022   Lab Results  Component Value Date   CHOL 154 12/03/2022   Lab Results  Component Value Date   HDL 49 12/03/2022   Lab Results  Component Value Date   LDLCALC 85 12/03/2022   Lab Results  Component Value Date   TRIG 109 12/03/2022   Lab Results  Component Value Date   CHOLHDL 3.1 12/03/2022   Lab Results  Component Value Date   HGBA1C 5.4 12/03/2022      Assessment & Plan:   Problem List Items Addressed This Visit       Cardiovascular and Mediastinum   Atrial fibrillation (HCC)    On dofetilide and Eliquis Currently in sinus rhythm Followed by Cardiology      HTN, goal below 140/90    BP Readings from Last 1 Encounters:  12/05/22 138/82  Well-controlled for his age Counseled for compliance with the medications Advised DASH diet and moderate exercise/walking as tolerated        Musculoskeletal and Integument   Tick bite of abdominal wall    Had 3 tick  bites, last on 11/25/22 Advised to keep area clean and dry Okay to apply cortisol cream as needed for itching        Genitourinary   Stage 3a chronic kidney disease (HCC)  Last CMP reviewed Needs to improve fluid intake On losartan        Other   Encounter for general adult medical examination with abnormal findings - Primary    Physical exam as documented. Fasting blood tests reviewed.      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   Follow-up: Return in about 6 months (around 06/07/2023) for A Fib ad HTN.    Anabel Halon, MD

## 2022-12-05 NOTE — Assessment & Plan Note (Addendum)
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

## 2022-12-05 NOTE — Assessment & Plan Note (Signed)
Had 3 tick bites, last on 11/25/22 Advised to keep area clean and dry Okay to apply cortisol cream as needed for itching

## 2022-12-05 NOTE — Assessment & Plan Note (Addendum)
Physical exam as documented. Fasting blood tests reviewed. 

## 2022-12-11 DIAGNOSIS — M7662 Achilles tendinitis, left leg: Secondary | ICD-10-CM | POA: Diagnosis not present

## 2022-12-11 DIAGNOSIS — M7661 Achilles tendinitis, right leg: Secondary | ICD-10-CM | POA: Diagnosis not present

## 2022-12-11 DIAGNOSIS — M19071 Primary osteoarthritis, right ankle and foot: Secondary | ICD-10-CM | POA: Diagnosis not present

## 2022-12-23 DIAGNOSIS — M67879 Other specified disorders of synovium and tendon, unspecified ankle and foot: Secondary | ICD-10-CM | POA: Diagnosis not present

## 2022-12-24 DIAGNOSIS — N138 Other obstructive and reflux uropathy: Secondary | ICD-10-CM | POA: Diagnosis not present

## 2022-12-24 DIAGNOSIS — D500 Iron deficiency anemia secondary to blood loss (chronic): Secondary | ICD-10-CM | POA: Diagnosis not present

## 2022-12-24 DIAGNOSIS — E875 Hyperkalemia: Secondary | ICD-10-CM | POA: Diagnosis not present

## 2022-12-25 LAB — BASIC METABOLIC PANEL
BUN/Creatinine Ratio: 13 (ref 10–24)
BUN: 17 mg/dL (ref 8–27)
CO2: 24 mmol/L (ref 20–29)
Calcium: 9.6 mg/dL (ref 8.6–10.2)
Chloride: 108 mmol/L — ABNORMAL HIGH (ref 96–106)
Creatinine, Ser: 1.27 mg/dL (ref 0.76–1.27)
Glucose: 102 mg/dL — ABNORMAL HIGH (ref 70–99)
Potassium: 5.1 mmol/L (ref 3.5–5.2)
Sodium: 145 mmol/L — ABNORMAL HIGH (ref 134–144)
eGFR: 56 mL/min/{1.73_m2} — ABNORMAL LOW (ref >59)

## 2022-12-25 LAB — CBC WITH DIFFERENTIAL/PLATELET
Basophils Absolute: 0 10*3/uL (ref 0.0–0.2)
Basos: 1 %
EOS (ABSOLUTE): 0.3 10*3/uL (ref 0.0–0.4)
Eos: 6 %
Hematocrit: 42.8 % (ref 37.5–51.0)
Hemoglobin: 14.6 g/dL (ref 13.0–17.7)
Immature Grans (Abs): 0 10*3/uL (ref 0.0–0.1)
Immature Granulocytes: 0 %
Lymphocytes Absolute: 2.2 10*3/uL (ref 0.7–3.1)
Lymphs: 36 %
MCH: 31 pg (ref 26.6–33.0)
MCHC: 34.1 g/dL (ref 31.5–35.7)
MCV: 91 fL (ref 79–97)
Monocytes Absolute: 0.4 10*3/uL (ref 0.1–0.9)
Monocytes: 7 %
Neutrophils Absolute: 2.9 10*3/uL (ref 1.4–7.0)
Neutrophils: 50 %
Platelets: 238 10*3/uL (ref 150–450)
RBC: 4.71 x10E6/uL (ref 4.14–5.80)
RDW: 15.6 % — ABNORMAL HIGH (ref 11.6–15.4)
WBC: 5.9 10*3/uL (ref 3.4–10.8)

## 2023-01-15 ENCOUNTER — Encounter: Payer: Self-pay | Admitting: Internal Medicine

## 2023-01-15 ENCOUNTER — Ambulatory Visit: Payer: Medicare Other | Admitting: Cardiology

## 2023-01-29 ENCOUNTER — Other Ambulatory Visit: Payer: Self-pay

## 2023-01-29 MED ORDER — DOFETILIDE 500 MCG PO CAPS: 500.0000 ug | ORAL_CAPSULE | Freq: Two times a day (BID) | ORAL | 1 refills | Status: AC

## 2023-01-29 NOTE — Telephone Encounter (Signed)
Pt's medication was sent to pt's pharmacy as requested. Confirmation received.  °

## 2023-03-04 ENCOUNTER — Other Ambulatory Visit: Payer: Self-pay | Admitting: Cardiology

## 2023-03-04 DIAGNOSIS — I4811 Longstanding persistent atrial fibrillation: Secondary | ICD-10-CM

## 2023-03-04 NOTE — Telephone Encounter (Signed)
Prescription refill request for Eliquis received. Indication: Afib  Last office visit: 07/15/22 ([place])  Scr: 1.27 (12/24/22)  Age: 82 Weight: 86.9kg  Appropriate dose. Refill sent.

## 2023-04-14 ENCOUNTER — Other Ambulatory Visit: Payer: Self-pay | Admitting: Internal Medicine

## 2023-05-12 ENCOUNTER — Other Ambulatory Visit: Payer: Self-pay | Admitting: Cardiology

## 2023-06-05 ENCOUNTER — Ambulatory Visit: Payer: Medicare Other | Admitting: Internal Medicine

## 2023-06-10 ENCOUNTER — Other Ambulatory Visit: Payer: Self-pay | Admitting: Cardiology

## 2024-02-16 ENCOUNTER — Other Ambulatory Visit: Payer: Self-pay | Admitting: Internal Medicine

## 2024-03-30 ENCOUNTER — Other Ambulatory Visit: Payer: Self-pay | Admitting: Internal Medicine

## 2024-08-05 ENCOUNTER — Encounter: Payer: Self-pay | Admitting: Gastroenterology
